# Patient Record
Sex: Female | Born: 1940 | Race: White | Hispanic: No | Marital: Single | Smoking: Former smoker
Health system: Southern US, Community
[De-identification: ages and names within clinical notes are randomized; demographics above are authoritative.]

## PROBLEM LIST (undated history)

## (undated) DIAGNOSIS — I2510 Atherosclerotic heart disease of native coronary artery without angina pectoris: Secondary | ICD-10-CM

## (undated) DIAGNOSIS — J449 Chronic obstructive pulmonary disease, unspecified: Secondary | ICD-10-CM

## (undated) DIAGNOSIS — I509 Heart failure, unspecified: Secondary | ICD-10-CM

## (undated) HISTORY — PX: CHOLECYSTECTOMY: SHX55

## (undated) HISTORY — PX: BOWEL RESECTION: SHX1257

## (undated) HISTORY — PX: ABDOMINAL HYSTERECTOMY: SHX81

---

## 2012-03-19 DIAGNOSIS — F419 Anxiety disorder, unspecified: Secondary | ICD-10-CM | POA: Insufficient documentation

## 2012-04-08 DIAGNOSIS — J449 Chronic obstructive pulmonary disease, unspecified: Secondary | ICD-10-CM | POA: Insufficient documentation

## 2013-05-31 DIAGNOSIS — R000 Tachycardia, unspecified: Secondary | ICD-10-CM

## 2013-05-31 HISTORY — DX: Tachycardia, unspecified: R00.0

## 2013-10-04 DIAGNOSIS — I5032 Chronic diastolic (congestive) heart failure: Secondary | ICD-10-CM | POA: Diagnosis present

## 2019-01-05 DIAGNOSIS — J690 Pneumonitis due to inhalation of food and vomit: Secondary | ICD-10-CM

## 2019-01-05 HISTORY — DX: Pneumonitis due to inhalation of food and vomit: J69.0

## 2020-03-14 DIAGNOSIS — R001 Bradycardia, unspecified: Secondary | ICD-10-CM

## 2020-03-14 HISTORY — DX: Bradycardia, unspecified: R00.1

## 2020-09-08 DIAGNOSIS — N179 Acute kidney failure, unspecified: Secondary | ICD-10-CM | POA: Insufficient documentation

## 2020-09-08 HISTORY — DX: Acute kidney failure, unspecified: N17.9

## 2021-04-03 DIAGNOSIS — A0472 Enterocolitis due to Clostridium difficile, not specified as recurrent: Secondary | ICD-10-CM

## 2021-04-03 HISTORY — DX: Enterocolitis due to Clostridium difficile, not specified as recurrent: A04.72

## 2022-01-02 ENCOUNTER — Other Ambulatory Visit: Payer: Self-pay

## 2022-01-02 ENCOUNTER — Emergency Department (HOSPITAL_COMMUNITY): Payer: Medicare Other

## 2022-01-02 ENCOUNTER — Encounter (HOSPITAL_COMMUNITY): Payer: Self-pay | Admitting: Emergency Medicine

## 2022-01-02 ENCOUNTER — Inpatient Hospital Stay (HOSPITAL_COMMUNITY)
Admission: EM | Admit: 2022-01-02 | Discharge: 2022-01-04 | DRG: 291 | Disposition: A | Discharge status: Home / Self Care | Payer: Medicare Other | Attending: Internal Medicine | Admitting: Internal Medicine

## 2022-01-02 DIAGNOSIS — F419 Anxiety disorder, unspecified: Secondary | ICD-10-CM | POA: Diagnosis present

## 2022-01-02 DIAGNOSIS — N1831 Chronic kidney disease, stage 3a: Secondary | ICD-10-CM | POA: Diagnosis present

## 2022-01-02 DIAGNOSIS — E785 Hyperlipidemia, unspecified: Secondary | ICD-10-CM | POA: Diagnosis present

## 2022-01-02 DIAGNOSIS — I10 Essential (primary) hypertension: Secondary | ICD-10-CM | POA: Diagnosis present

## 2022-01-02 DIAGNOSIS — G629 Polyneuropathy, unspecified: Secondary | ICD-10-CM | POA: Diagnosis present

## 2022-01-02 DIAGNOSIS — M109 Gout, unspecified: Secondary | ICD-10-CM | POA: Diagnosis present

## 2022-01-02 DIAGNOSIS — M549 Dorsalgia, unspecified: Secondary | ICD-10-CM | POA: Diagnosis present

## 2022-01-02 DIAGNOSIS — I509 Heart failure, unspecified: Secondary | ICD-10-CM | POA: Diagnosis not present

## 2022-01-02 DIAGNOSIS — J189 Pneumonia, unspecified organism: Secondary | ICD-10-CM | POA: Diagnosis present

## 2022-01-02 DIAGNOSIS — R0600 Dyspnea, unspecified: Secondary | ICD-10-CM

## 2022-01-02 DIAGNOSIS — Z20822 Contact with and (suspected) exposure to covid-19: Secondary | ICD-10-CM | POA: Diagnosis present

## 2022-01-02 DIAGNOSIS — J810 Acute pulmonary edema: Secondary | ICD-10-CM | POA: Diagnosis not present

## 2022-01-02 DIAGNOSIS — J440 Chronic obstructive pulmonary disease with acute lower respiratory infection: Secondary | ICD-10-CM | POA: Diagnosis present

## 2022-01-02 DIAGNOSIS — D649 Anemia, unspecified: Secondary | ICD-10-CM | POA: Diagnosis present

## 2022-01-02 DIAGNOSIS — R778 Other specified abnormalities of plasma proteins: Secondary | ICD-10-CM

## 2022-01-02 DIAGNOSIS — I3139 Other pericardial effusion (noninflammatory): Secondary | ICD-10-CM | POA: Diagnosis present

## 2022-01-02 DIAGNOSIS — K5090 Crohn's disease, unspecified, without complications: Secondary | ICD-10-CM | POA: Diagnosis present

## 2022-01-02 DIAGNOSIS — D631 Anemia in chronic kidney disease: Secondary | ICD-10-CM | POA: Diagnosis present

## 2022-01-02 DIAGNOSIS — I248 Other forms of acute ischemic heart disease: Secondary | ICD-10-CM | POA: Diagnosis present

## 2022-01-02 DIAGNOSIS — I422 Other hypertrophic cardiomyopathy: Secondary | ICD-10-CM | POA: Diagnosis present

## 2022-01-02 DIAGNOSIS — I5043 Acute on chronic combined systolic (congestive) and diastolic (congestive) heart failure: Secondary | ICD-10-CM | POA: Diagnosis present

## 2022-01-02 DIAGNOSIS — I452 Bifascicular block: Secondary | ICD-10-CM | POA: Diagnosis present

## 2022-01-02 DIAGNOSIS — J9621 Acute and chronic respiratory failure with hypoxia: Secondary | ICD-10-CM | POA: Diagnosis present

## 2022-01-02 DIAGNOSIS — I5033 Acute on chronic diastolic (congestive) heart failure: Secondary | ICD-10-CM | POA: Diagnosis not present

## 2022-01-02 DIAGNOSIS — G8929 Other chronic pain: Secondary | ICD-10-CM | POA: Diagnosis present

## 2022-01-02 DIAGNOSIS — Z9981 Dependence on supplemental oxygen: Secondary | ICD-10-CM | POA: Diagnosis not present

## 2022-01-02 DIAGNOSIS — F32A Depression, unspecified: Secondary | ICD-10-CM | POA: Diagnosis present

## 2022-01-02 DIAGNOSIS — E559 Vitamin D deficiency, unspecified: Secondary | ICD-10-CM | POA: Diagnosis present

## 2022-01-02 DIAGNOSIS — I130 Hypertensive heart and chronic kidney disease with heart failure and stage 1 through stage 4 chronic kidney disease, or unspecified chronic kidney disease: Secondary | ICD-10-CM | POA: Diagnosis not present

## 2022-01-02 HISTORY — DX: Heart failure, unspecified: I50.9

## 2022-01-02 LAB — COMPREHENSIVE METABOLIC PANEL
ALT: 6 U/L (ref 0–44)
AST: 20 U/L (ref 15–41)
Albumin: 3.7 g/dL (ref 3.5–5.0)
Alkaline Phosphatase: 33 U/L — ABNORMAL LOW (ref 38–126)
Anion gap: 12 (ref 5–15)
BUN: 22 mg/dL (ref 8–23)
CO2: 26 mmol/L (ref 22–32)
Calcium: 8.8 mg/dL — ABNORMAL LOW (ref 8.9–10.3)
Chloride: 100 mmol/L (ref 98–111)
Creatinine, Ser: 1.11 mg/dL — ABNORMAL HIGH (ref 0.44–1.00)
GFR, Estimated: 51 mL/min — ABNORMAL LOW (ref >60)
Glucose, Bld: 104 mg/dL — ABNORMAL HIGH (ref 70–99)
Potassium: 4.6 mmol/L (ref 3.5–5.1)
Sodium: 138 mmol/L (ref 135–145)
Total Bilirubin: 0.6 mg/dL (ref 0.3–1.2)
Total Protein: 6.6 g/dL (ref 6.5–8.1)

## 2022-01-02 LAB — CBC
HCT: 31.3 % — ABNORMAL LOW (ref 36.0–46.0)
Hemoglobin: 10.5 g/dL — ABNORMAL LOW (ref 12.0–15.0)
MCH: 31.9 pg (ref 26.0–34.0)
MCHC: 33.5 g/dL (ref 30.0–36.0)
MCV: 95.1 fL (ref 80.0–100.0)
Platelets: 238 10*3/uL (ref 150–400)
RBC: 3.29 MIL/uL — ABNORMAL LOW (ref 3.87–5.11)
RDW: 13.1 % (ref 11.5–15.5)
WBC: 5.9 10*3/uL (ref 4.0–10.5)
nRBC: 0 % (ref 0.0–0.2)

## 2022-01-02 LAB — BLOOD GAS, VENOUS
Acid-Base Excess: 6.6 mmol/L — ABNORMAL HIGH (ref 0.0–2.0)
Bicarbonate: 31.3 mmol/L — ABNORMAL HIGH (ref 20.0–28.0)
Drawn by: 56039
O2 Saturation: 89.8 %
Patient temperature: 37
pCO2, Ven: 44 mmHg (ref 44–60)
pH, Ven: 7.46 — ABNORMAL HIGH (ref 7.25–7.43)
pO2, Ven: 62 mmHg — ABNORMAL HIGH (ref 32–45)

## 2022-01-02 LAB — CBC WITH DIFFERENTIAL/PLATELET
Abs Immature Granulocytes: 0.02 10*3/uL (ref 0.00–0.07)
Abs Immature Granulocytes: 0.03 10*3/uL (ref 0.00–0.07)
Basophils Absolute: 0.1 10*3/uL (ref 0.0–0.1)
Basophils Absolute: 0.1 10*3/uL (ref 0.0–0.1)
Basophils Relative: 1 %
Basophils Relative: 1 %
Eosinophils Absolute: 0.1 10*3/uL (ref 0.0–0.5)
Eosinophils Absolute: 0.2 10*3/uL (ref 0.0–0.5)
Eosinophils Relative: 2 %
Eosinophils Relative: 3 %
HCT: 31.1 % — ABNORMAL LOW (ref 36.0–46.0)
HCT: 35.5 % — ABNORMAL LOW (ref 36.0–46.0)
Hemoglobin: 10.1 g/dL — ABNORMAL LOW (ref 12.0–15.0)
Hemoglobin: 11.5 g/dL — ABNORMAL LOW (ref 12.0–15.0)
Immature Granulocytes: 0 %
Immature Granulocytes: 0 %
Lymphocytes Relative: 12 %
Lymphocytes Relative: 17 %
Lymphs Abs: 0.8 10*3/uL (ref 0.7–4.0)
Lymphs Abs: 1 10*3/uL (ref 0.7–4.0)
MCH: 30.6 pg (ref 26.0–34.0)
MCH: 31.3 pg (ref 26.0–34.0)
MCHC: 32.4 g/dL (ref 30.0–36.0)
MCHC: 32.5 g/dL (ref 30.0–36.0)
MCV: 94.2 fL (ref 80.0–100.0)
MCV: 96.5 fL (ref 80.0–100.0)
Monocytes Absolute: 0.4 10*3/uL (ref 0.1–1.0)
Monocytes Absolute: 0.4 10*3/uL (ref 0.1–1.0)
Monocytes Relative: 6 %
Monocytes Relative: 7 %
Neutro Abs: 4 10*3/uL (ref 1.7–7.7)
Neutro Abs: 5.3 10*3/uL (ref 1.7–7.7)
Neutrophils Relative %: 73 %
Neutrophils Relative %: 78 %
Platelets: 236 10*3/uL (ref 150–400)
Platelets: 272 10*3/uL (ref 150–400)
RBC: 3.3 MIL/uL — ABNORMAL LOW (ref 3.87–5.11)
RBC: 3.68 MIL/uL — ABNORMAL LOW (ref 3.87–5.11)
RDW: 12.9 % (ref 11.5–15.5)
RDW: 13 % (ref 11.5–15.5)
WBC: 5.6 10*3/uL (ref 4.0–10.5)
WBC: 6.8 10*3/uL (ref 4.0–10.5)
nRBC: 0 % (ref 0.0–0.2)
nRBC: 0 % (ref 0.0–0.2)

## 2022-01-02 LAB — BRAIN NATRIURETIC PEPTIDE: B Natriuretic Peptide: 574.4 pg/mL — ABNORMAL HIGH (ref 0.0–100.0)

## 2022-01-02 LAB — CREATININE, SERUM
Creatinine, Ser: 1.15 mg/dL — ABNORMAL HIGH (ref 0.44–1.00)
GFR, Estimated: 48 mL/min — ABNORMAL LOW (ref >60)

## 2022-01-02 LAB — RESP PANEL BY RT-PCR (FLU A&B, COVID) ARPGX2
Influenza A by PCR: NEGATIVE
Influenza B by PCR: NEGATIVE
SARS Coronavirus 2 by RT PCR: NEGATIVE

## 2022-01-02 LAB — I-STAT VENOUS BLOOD GAS, ED
Acid-Base Excess: 8 mmol/L — ABNORMAL HIGH (ref 0.0–2.0)
Bicarbonate: 29.4 mmol/L — ABNORMAL HIGH (ref 20.0–28.0)
Calcium, Ion: 0.99 mmol/L — ABNORMAL LOW (ref 1.15–1.40)
HCT: 33 % — ABNORMAL LOW (ref 36.0–46.0)
Hemoglobin: 11.2 g/dL — ABNORMAL LOW (ref 12.0–15.0)
O2 Saturation: 99 %
Potassium: 4.5 mmol/L (ref 3.5–5.1)
Sodium: 138 mmol/L (ref 135–145)
TCO2: 30 mmol/L (ref 22–32)
pCO2, Ven: 30.7 mmHg — ABNORMAL LOW (ref 44–60)
pH, Ven: 7.589 — ABNORMAL HIGH (ref 7.25–7.43)
pO2, Ven: 96 mmHg — ABNORMAL HIGH (ref 32–45)

## 2022-01-02 LAB — TROPONIN I (HIGH SENSITIVITY)
Troponin I (High Sensitivity): 139 ng/L (ref <18)
Troponin I (High Sensitivity): 211 ng/L (ref <18)
Troponin I (High Sensitivity): 218 ng/L (ref <18)

## 2022-01-02 LAB — MAGNESIUM: Magnesium: 2.1 mg/dL (ref 1.7–2.4)

## 2022-01-02 LAB — D-DIMER, QUANTITATIVE: D-Dimer, Quant: 0.38 ug/mL-FEU (ref 0.00–0.50)

## 2022-01-02 LAB — TSH: TSH: 2.01 u[IU]/mL (ref 0.350–4.500)

## 2022-01-02 LAB — LIPASE, BLOOD: Lipase: 43 U/L (ref 11–51)

## 2022-01-02 MED ORDER — FUROSEMIDE 10 MG/ML IJ SOLN
20.0000 mg | Freq: Every day | INTRAMUSCULAR | Status: DC
Start: 2022-01-02 — End: 2022-01-02

## 2022-01-02 MED ORDER — MELATONIN 5 MG PO TABS: 5.0000 mg | ORAL_TABLET | Freq: Every evening | ORAL | Status: DC | PRN

## 2022-01-02 MED ORDER — SODIUM CHLORIDE 0.9 % IV SOLN
1.0000 g | Freq: Once | INTRAVENOUS | Status: AC
  Administered 2022-01-02: 1 g via INTRAVENOUS
  Filled 2022-01-02: qty 10

## 2022-01-02 MED ORDER — POLYETHYLENE GLYCOL 3350 17 G PO PACK: 17.0000 g | PACK | Freq: Every day | ORAL | Status: DC | PRN

## 2022-01-02 MED ORDER — AZITHROMYCIN 500 MG IV SOLR
500.0000 mg | Freq: Once | INTRAVENOUS | Status: AC
  Administered 2022-01-02: 500 mg via INTRAVENOUS
  Filled 2022-01-02: qty 5

## 2022-01-02 MED ORDER — EZETIMIBE 10 MG PO TABS
10.0000 mg | ORAL_TABLET | Freq: Every day | ORAL | Status: DC
  Administered 2022-01-03 – 2022-01-04 (×2): 10 mg via ORAL
  Filled 2022-01-02 (×2): qty 1

## 2022-01-02 MED ORDER — IPRATROPIUM-ALBUTEROL 0.5-2.5 (3) MG/3ML IN SOLN
3.0000 mL | Freq: Four times a day (QID) | RESPIRATORY_TRACT | Status: DC
  Administered 2022-01-02 – 2022-01-03 (×3): 3 mL via RESPIRATORY_TRACT
  Filled 2022-01-02 (×3): qty 3

## 2022-01-02 MED ORDER — ACETAMINOPHEN 325 MG PO TABS
650.0000 mg | ORAL_TABLET | Freq: Four times a day (QID) | ORAL | Status: DC | PRN
  Administered 2022-01-03: 650 mg via ORAL
  Filled 2022-01-02: qty 2

## 2022-01-02 MED ORDER — IPRATROPIUM-ALBUTEROL 0.5-2.5 (3) MG/3ML IN SOLN
3.0000 mL | Freq: Once | RESPIRATORY_TRACT | Status: AC
  Administered 2022-01-02: 3 mL via RESPIRATORY_TRACT
  Filled 2022-01-02: qty 3

## 2022-01-02 MED ORDER — ASPIRIN 81 MG PO CHEW
324.0000 mg | CHEWABLE_TABLET | Freq: Once | ORAL | Status: AC
  Administered 2022-01-02: 324 mg via ORAL
  Filled 2022-01-02: qty 4

## 2022-01-02 MED ORDER — FUROSEMIDE 10 MG/ML IJ SOLN
20.0000 mg | Freq: Every day | INTRAMUSCULAR | Status: DC
  Administered 2022-01-03: 20 mg via INTRAVENOUS
  Filled 2022-01-02: qty 2

## 2022-01-02 MED ORDER — PANTOPRAZOLE SODIUM 40 MG PO TBEC
40.0000 mg | DELAYED_RELEASE_TABLET | Freq: Every day | ORAL | Status: DC
Start: 2022-01-03 — End: 2022-01-04
  Administered 2022-01-03 – 2022-01-04 (×2): 40 mg via ORAL
  Filled 2022-01-02 (×2): qty 1

## 2022-01-02 MED ORDER — FUROSEMIDE 10 MG/ML IJ SOLN
20.0000 mg | Freq: Once | INTRAMUSCULAR | Status: AC
  Administered 2022-01-02: 20 mg via INTRAVENOUS
  Filled 2022-01-02: qty 2

## 2022-01-02 MED ORDER — ENOXAPARIN SODIUM 40 MG/0.4ML IJ SOSY
40.0000 mg | PREFILLED_SYRINGE | INTRAMUSCULAR | Status: DC
  Administered 2022-01-02 – 2022-01-03 (×2): 40 mg via SUBCUTANEOUS
  Filled 2022-01-02 (×2): qty 0.4

## 2022-01-02 MED ORDER — ALPRAZOLAM 0.5 MG PO TABS
0.5000 mg | ORAL_TABLET | Freq: Two times a day (BID) | ORAL | Status: DC | PRN
Start: 2022-01-02 — End: 2022-01-03
  Administered 2022-01-02 – 2022-01-03 (×2): 0.5 mg via ORAL
  Filled 2022-01-02 (×2): qty 1

## 2022-01-02 MED ORDER — FUROSEMIDE 10 MG/ML IJ SOLN
20.0000 mg | Freq: Every day | INTRAMUSCULAR | Status: DC
Start: 2022-01-03 — End: 2022-01-02

## 2022-01-02 MED ORDER — PROCHLORPERAZINE EDISYLATE 10 MG/2ML IJ SOLN
10.0000 mg | Freq: Four times a day (QID) | INTRAMUSCULAR | Status: DC | PRN
Start: 2022-01-02 — End: 2022-01-04

## 2022-01-02 MED ORDER — IOHEXOL 300 MG/ML  SOLN
100.0000 mL | Freq: Once | INTRAMUSCULAR | Status: AC | PRN
  Administered 2022-01-02: 100 mL via INTRAVENOUS

## 2022-01-02 MED ORDER — CARBAMAZEPINE ER 200 MG PO CP12
200.0000 mg | ORAL_CAPSULE | Freq: Two times a day (BID) | ORAL | Status: DC
  Administered 2022-01-02 – 2022-01-03 (×2): 200 mg via ORAL
  Filled 2022-01-02 (×4): qty 1

## 2022-01-02 MED ORDER — SODIUM CHLORIDE 0.9 % IV BOLUS
500.0000 mL | Freq: Once | INTRAVENOUS | Status: AC
  Administered 2022-01-02: 500 mL via INTRAVENOUS

## 2022-01-02 NOTE — ED Provider Notes (Signed)
[facility] EMERGENCY DEPARTMENT Provider Note   CSN: [id number] Arrival date & time: 01/02/22  1617     History  Chief Complaint  Patient presents with   Hypotension    Brittany Irwin is a 81 y.o. female.  Patient as above with significant medical history as below, including diastolic heart failure, Crohn disease, prior bowel obstruction, 3 to 4 L home oxygen at all times, Petrova cardiomyopathy, anxiety who presents to the ED with complaint of dyspnea, abdominal tightness/fullness, palpitations, malaise.  Patient reports she has been declining over the past few months.  In the past 2 days she has felt more short of breath than normal and she feels as though her heart is been racing intermittently.  Prior to calling EMS this afternoon she was ambulating to the bathroom, she is feeling short of breath with exertion and also feels like her heart was racing.  She took half of an atenolol and a Xanax and a nitroglycerin and her symptoms improved greatly.  No chest pain during this time.  Patient also reports increased abdominal fullness and tightness over the past week.  Right upper quadrant pain.  No nausea or vomiting.  No change in bowel or bladder function.  No melena or BRBPR.  No fevers or chills.  No recent travel or sick contacts.  No recent medication changes or diet changes.  Compliant with home medications.     History reviewed. No pertinent past medical history.  History reviewed. No pertinent surgical history.   The history is provided by the patient and the EMS personnel. No language interpreter was used.       Home Medications Prior to Admission medications   Not on File      Allergies    Patient has no allergy information on record.    Review of Systems   Review of Systems  Constitutional:  Positive for activity change and fatigue. Negative for chills and fever.  HENT:  Negative for facial swelling and trouble swallowing.   Eyes:  Negative for  photophobia and visual disturbance.  Respiratory:  Positive for shortness of breath. Negative for cough.   Cardiovascular:  Positive for palpitations. Negative for chest pain.  Gastrointestinal:  Positive for abdominal pain. Negative for anal bleeding, diarrhea, nausea and vomiting.  Endocrine: Negative for polydipsia and polyuria.  Genitourinary:  Negative for difficulty urinating and hematuria.  Musculoskeletal:  Negative for gait problem and joint swelling.  Skin:  Negative for pallor and rash.  Neurological:  Negative for syncope and headaches.  Psychiatric/Behavioral:  Negative for agitation and confusion.     Physical Exam Updated Vital Signs BP (!) 120/55   Pulse (!) 50   Temp 98.1 F (36.7 C) (Oral)   Resp 14   SpO2 95%  Physical Exam Vitals and nursing note reviewed. Exam conducted with a chaperone present.  Constitutional:      General: She is not in acute distress.    Appearance: Normal appearance. She is not ill-appearing.  HENT:     Head: Normocephalic and atraumatic. No raccoon eyes, Battle's sign, right periorbital erythema or left periorbital erythema.     Right Ear: External ear normal.     Left Ear: External ear normal.     Nose: Nose normal.     Mouth/Throat:     Mouth: Mucous membranes are moist.  Eyes:     General: No scleral icterus.       Right eye: No discharge.  Left eye: No discharge.     Extraocular Movements: Extraocular movements intact.     Pupils: Pupils are equal, round, and reactive to light.  Cardiovascular:     Rate and Rhythm: Normal rate and regular rhythm.     Pulses: Normal pulses.          Radial pulses are 2+ on the right side and 2+ on the left side.       Dorsalis pedis pulses are 2+ on the right side and 2+ on the left side.     Heart sounds: Normal heart sounds.  Pulmonary:     Effort: Pulmonary effort is normal. No respiratory distress.     Breath sounds: Normal breath sounds.  Abdominal:     General: Abdomen is flat.      Tenderness: There is no abdominal tenderness.  Musculoskeletal:        General: Normal range of motion.     Cervical back: Normal range of motion.     Right lower leg: No edema.     Left lower leg: No edema.  Skin:    General: Skin is warm and dry.     Capillary Refill: Capillary refill takes less than 2 seconds.     Coloration: Skin is pale.  Neurological:     Mental Status: She is alert and oriented to person, place, and time.     GCS: GCS eye subscore is 4. GCS verbal subscore is 5. GCS motor subscore is 6.     Cranial Nerves: Cranial nerves 2-12 are intact. No dysarthria.     Sensory: Sensation is intact.     Motor: Motor function is intact.     Coordination: Coordination is intact.  Psychiatric:        Mood and Affect: Mood normal.        Behavior: Behavior normal.     ED Results / Procedures / Treatments   Labs (all labs ordered are listed, but only abnormal results are displayed) Labs Reviewed  CBC WITH DIFFERENTIAL/PLATELET - Abnormal; Notable for the following components:      Result Value   RBC 3.68 (*)    Hemoglobin 11.5 (*)    HCT 35.5 (*)    All other components within normal limits  BRAIN NATRIURETIC PEPTIDE - Abnormal; Notable for the following components:   B Natriuretic Peptide 574.4 (*)    All other components within normal limits  COMPREHENSIVE METABOLIC PANEL - Abnormal; Notable for the following components:   Glucose, Bld 104 (*)    Creatinine, Ser 1.11 (*)    Calcium 8.8 (*)    Alkaline Phosphatase 33 (*)    GFR, Estimated 51 (*)    All other components within normal limits  CBC - Abnormal; Notable for the following components:   RBC 3.29 (*)    Hemoglobin 10.5 (*)    HCT 31.3 (*)    All other components within normal limits  CREATININE, SERUM - Abnormal; Notable for the following components:   Creatinine, Ser 1.15 (*)    GFR, Estimated 48 (*)    All other components within normal limits  I-STAT VENOUS BLOOD GAS, ED - Abnormal; Notable  for the following components:   pH, Ven 7.589 (*)    pCO2, Ven 30.7 (*)    pO2, Ven 96 (*)    Bicarbonate 29.4 (*)    Acid-Base Excess 8.0 (*)    Calcium, Ion 0.99 (*)    HCT 33.0 (*)    Hemoglobin 11.2 (*)  All other components within normal limits  TROPONIN I (HIGH SENSITIVITY) - Abnormal; Notable for the following components:   Troponin I (High Sensitivity) 139 (*)    All other components within normal limits  TROPONIN I (HIGH SENSITIVITY) - Abnormal; Notable for the following components:   Troponin I (High Sensitivity) 211 (*)    All other components within normal limits  TROPONIN I (HIGH SENSITIVITY) - Abnormal; Notable for the following components:   Troponin I (High Sensitivity) 218 (*)    All other components within normal limits  RESP PANEL BY RT-PCR (FLU A&B, COVID) ARPGX2  EXPECTORATED SPUTUM ASSESSMENT W GRAM STAIN, RFLX TO RESP C  D-DIMER, QUANTITATIVE  TSH  LIPASE, BLOOD  MAGNESIUM  BLOOD GAS, VENOUS  URINALYSIS, ROUTINE W REFLEX MICROSCOPIC  CBC WITH DIFFERENTIAL/PLATELET  COMPREHENSIVE METABOLIC PANEL  MAGNESIUM  PHOSPHORUS  TROPONIN I (HIGH SENSITIVITY)    EKG EKG Interpretation  Date/Time:  Friday January 02 2022 21:49:53 EDT Ventricular Rate:  51 PR Interval:  230 QRS Duration: 147 QT Interval:  534 QTC Calculation: 492 R Axis:   -6 Text Interpretation: Sinus rhythm Prolonged PR interval IVCD, consider atypical RBBB LVH with secondary repolarization abnormality Lateral infarct, old Anterior Q waves, possibly due to LVH Interpretation limited secondary to artifact Confirmed by Tanda Rockers ([id number]) on 01/02/2022 10:55:42 PM  Radiology CT ABDOMEN PELVIS W CONTRAST  Result Date: 01/02/2022 CLINICAL DATA:  Abdominal pain. EXAM: CT ABDOMEN AND PELVIS WITH CONTRAST TECHNIQUE: Multidetector CT imaging of the abdomen and pelvis was performed using the standard protocol following bolus administration of intravenous contrast. RADIATION DOSE REDUCTION: This exam  was performed according to the departmental dose-optimization program which includes automated exposure control, adjustment of the mA and/or kV according to patient size and/or use of iterative reconstruction technique. CONTRAST:  OMNIPAQUE IOHEXOL 300 MG/ML  SOLN COMPARISON:  None Available. FINDINGS: Lower chest: There are bibasilar linear atelectasis/scarring. Cluster of nodularity at the right lung base may be chronic scarring. Atypical infiltrate or aspiration is not excluded clinical correlation is recommended. Trace pleural effusions suspected. There is coronary vascular calcification. No intra-abdominal free air or free fluid. Hepatobiliary: The liver is unremarkable. There is mild dilatation of the biliary tree, likely post cholecystectomy. No retained calcified stone noted in the central CBD. Pancreas: Unremarkable. No pancreatic ductal dilatation or surrounding inflammatory changes. Spleen: Normal in size without focal abnormality. Adrenals/Urinary Tract: The adrenal glands are unremarkable. Several small left renal cysts. There is no hydronephrosis on either side. There is symmetric enhancement of the kidneys. The visualized ureters and urinary bladder appear unremarkable. Stomach/Bowel: There is postsurgical changes of right hemicolectomy with ileotransverse anastomosis in the right upper abdomen. There is sigmoid diverticulosis without active inflammatory changes. There is no bowel obstruction or active inflammation. Appendectomy. Vascular/Lymphatic: Mild aortoiliac atherosclerotic disease. The IVC is unremarkable. No portal venous gas. There is no adenopathy. Reproductive: Hysterectomy.  No adnexal masses. Other: Midline vertical anterior pelvic wall incisional scar. Musculoskeletal: Osteopenia with degenerative changes of the spine. No acute osseous pathology. IMPRESSION: 1. No acute intra-abdominal or pelvic pathology. 2. Sigmoid diverticulosis without active inflammatory changes. 3.  Postsurgical changes of right hemicolectomy with ileotransverse anastomosis in the right upper abdomen. No bowel obstruction. 4. Cluster of nodularity at the right lung base may be chronic scarring. Atypical infiltrate or aspiration is not excluded clinical correlation is recommended. Trace pleural effusions suspected. 5. Aortic Atherosclerosis (ICD10-I70.0). Electronically Signed   By: Elgie Collard M.D.   On: 01/02/2022 20:13  DG Chest Portable 1 View  Result Date: 01/02/2022 CLINICAL DATA:  Patient rotation EXAM: PORTABLE CHEST 1 VIEW COMPARISON:  Same day PA and lateral chest x-ray. FINDINGS: Mild cardiomegaly. Lower lung predominant interstitial opacities. More focal linear opacities are seen in the right lower lung. No evidence of pleural effusion or pneumothorax. IMPRESSION: 1. Lower lung predominant interstitial opacities, likely due to pulmonary edema. 2. More focal linear opacities are seen in the right lower lung, possibly due to scarring. Recommend correlation with outside prior imaging. Electronically Signed   By: Allegra Lai M.D.   On: 01/02/2022 18:22   DG Chest 2 View  Result Date: 01/02/2022 CLINICAL DATA:  Dib. Per EMS pt states that she was "not feeling well" and took 1 Nitro Sublingual, 1 Xanax and 1 Atenolol. Pt denied having CP. Pt also c/o Increased SHOB w/ exertion, weakness x2 days. EXAM: CHEST - 2 VIEW.  Patient is rotated on the frontal view. COMPARISON:  None available FINDINGS: The heart and mediastinal contours are within normal limits. Bibasilar atelectasis. Increased interstitial markings and Kerley B lines. No pulmonary edema. Trace bilateral pleural effusions. No pneumothorax. No acute osseous abnormality. IMPRESSION: 1. Pulmonary edema with trace bilateral pleural effusions. 2. Markedly rotated patient on frontal view due to patient rotation. Recommend repeat PA radiograph. Electronically Signed   By: Tish Frederickson M.D.   On: 01/02/2022 17:06     Procedures .Critical Care  Performed by: Sloan Leiter, DO Authorized by: Sloan Leiter, DO   Critical care provider statement:    Critical care time (minutes):  40   Critical care was necessary to treat or prevent imminent or life-threatening deterioration of the following conditions:  Cardiac failure   Critical care was time spent personally by me on the following activities:  Development of treatment plan with patient or surrogate, discussions with consultants, evaluation of patient's response to treatment, examination of patient, ordering and review of laboratory studies, ordering and review of radiographic studies, ordering and performing treatments and interventions, pulse oximetry, re-evaluation of patient's condition, review of old charts and obtaining history from patient or surrogate   Care discussed with: admitting provider       Medications Ordered in ED Medications  enoxaparin (LOVENOX) injection 40 mg (40 mg Subcutaneous Given 01/02/22 2147)  acetaminophen (TYLENOL) tablet 650 mg (has no administration in time range)  melatonin tablet 5 mg (has no administration in time range)  polyethylene glycol (MIRALAX / GLYCOLAX) packet 17 g (has no administration in time range)  prochlorperazine (COMPAZINE) injection 10 mg (has no administration in time range)  furosemide (LASIX) injection 20 mg (has no administration in time range)  carbamazepine (EQUETRO) 12 hr capsule 200 mg (has no administration in time range)  ALPRAZolam (XANAX) tablet 0.5 mg (0.5 mg Oral Given 01/02/22 2256)  ezetimibe (ZETIA) tablet 10 mg (has no administration in time range)  pantoprazole (PROTONIX) EC tablet 40 mg (has no administration in time range)  ipratropium-albuterol (DUONEB) 0.5-2.5 (3) MG/3ML nebulizer solution 3 mL (3 mLs Nebulization Given 01/02/22 2254)  sodium chloride 0.9 % bolus 500 mL (0 mLs Intravenous Stopped 01/02/22 1914)  ipratropium-albuterol (DUONEB) 0.5-2.5 (3) MG/3ML nebulizer  solution 3 mL (3 mLs Nebulization Given 01/02/22 1804)  furosemide (LASIX) injection 20 mg (20 mg Intravenous Given 01/02/22 1826)  aspirin chewable tablet 324 mg (324 mg Oral Given 01/02/22 1827)  iohexol (OMNIPAQUE) 300 MG/ML solution 100 mL (100 mLs Intravenous Contrast Given 01/02/22 1934)  cefTRIAXone (ROCEPHIN) 1 g in sodium chloride  0.9 % 100 mL IVPB (0 g Intravenous Stopped 01/02/22 2121)  azithromycin (ZITHROMAX) 500 mg in sodium chloride 0.9 % 250 mL IVPB (500 mg Intravenous New Bag/Given 01/02/22 2147)    ED Course/ Medical Decision Making/ A&P Clinical Course as of 01/02/22 2256  Fri Jan 02, 2022  1645 BP with EMS was 90's systolic x1, this was following atenolol, nitro po, and xanax [SG]  1813 BNP elevated over 500, chest x-ray concern for pulmonary edema, patient with worsening exertional dyspnea.  Hold off on IV fluids.  We will Dartt diuresis.  Low threshold to start BiPAP if patient decompensates or becomes hypotensive with diuretics. [SG]  1814 Troponin I (High Sensitivity)(!!): 139 She has no chest pain, ECG is stable.  Favor demand ischemia in setting of CHF exacerbation, dyspnea.  Will give aspirin [SG]    Clinical Course User Index [SG] Sloan Leiter, DO                           Medical Decision Making Amount and/or Complexity of Data Reviewed Labs: ordered. Decision-making details documented in ED Course. Radiology: ordered.  Risk OTC drugs. Prescription drug management. Decision regarding hospitalization.    CC: Multiple complaints including dyspnea, palpitations, right upper quad abdominal pain, malaise  This patient presents to the Emergency Department for the above complaint. This involves an extensive number of treatment options and is a complaint that carries with it a high risk of complications and morbidity. Vital signs were reviewed. Serious etiologies considered.  In my evaluation of this patient's dyspnea my DDx includes, but is not limited to,  pneumonia, pulmonary embolism, pneumothorax, pulmonary edema, metabolic acidosis, asthma, COPD, cardiac cause, anemia, anxiety, etc. Differential diagnosis includes but is not exclusive to acute cholecystitis, intrathoracic causes for epigastric abdominal pain, gastritis, duodenitis, pancreatitis, small bowel or large bowel obstruction, abdominal aortic aneurysm, hernia, gastritis, etc.  Patient was mildly hypotensive with EMS, just prior to being eval by EMS she did take a nitroglycerin, Xanax and beta-blocker.  Likely contributing factor to her low blood pressure.  Blood pressure improved while in the emergency department.  Record review:  Previous records obtained and reviewed  Patient receives majority of her care with [facility], prior office visits, prior labs and imaging reviewed.  PDMP reviewed.   Additional history obtained from EMS  Medical and surgical history as noted above.   Work up as above, notable for:  Labs & imaging results that were available during my care of the patient were visualized by me and considered in my medical decision making.   Patient is low risk Wells score, she is bradycardic and did take a beta-blocker earlier today, she does have exertional dyspnea but no chest pain.  Will obtain D-dimer.  I ordered imaging studies which included chest x-ray, CT abdomen pelvis IV contrast. I visualized the imaging, interpreted images, and I agree with radiologist interpretation.  ?Pneumonia, pulmonary edema, pleural effusion  Cardiac monitoring reviewed and interpreted personally which shows sinus bradycardia  BNP is elevated, patient is evidence of pulmonary edema and pleural effusion on imaging.  She is breathing comfortably on her 4 L home oxygen.  She is amenable to BiPAP if needed.  Blood pressure has been stable With diuresis.  She has elevated troponin, favor demand ischemia in setting of CHF exacerbation, dyspnea.  No chest pain.  ECG is stable.  Will give aspirin.   Recommend trend troponin to peak  Low risk Wells score,  D-dimer is negative.  Suspicion for PE is reduced.  Dyspnea, cough with white/clear sputum mildly worse than her baseline, history of COPD, home oxygen dependence.  Questionable pneumonia on imaging.  Will empirically treat with broad-spectrum antibiotics given aberration of respiratory status  Management: Aspirin, diuresis, broad-spectrum antibiotics  Did initially order IV fluids but these were discontinued prior to administration given reduced blood pressure just prior to arrival.   Reassessment:  Patient is feeling much better after intervention the emergency department.  Given respiratory difficulty, concern for diastolic heart failure exacerbation requiring IV diuresis, borderline hypotension, questionable pneumonia with favor admission.  Patient is agreeable  Admission was considered.    Discussed with Dr. Margo Aye who accepts patient for admission.            Social determinants of health include -  Social History   Socioeconomic History   Marital status: Unknown    Spouse name: Not on file   Number of children: Not on file   Years of education: Not on file   Highest education level: Not on file  Occupational History   Not on file  Tobacco Use   Smoking status: Not on file   Smokeless tobacco: Not on file  Substance and Sexual Activity   Alcohol use: Not on file   Drug use: Not on file   Sexual activity: Not on file  Other Topics Concern   Not on file  Social History Narrative   Not on file   Social Determinants of Health   Financial Resource Strain: Not on file  Food Insecurity: Not on file  Transportation Needs: Not on file  Physical Activity: Not on file  Stress: Not on file  Social Connections: Not on file  Intimate Partner Violence: Not on file      This chart was dictated using voice recognition software.  Despite best efforts to proofread,  errors can occur which can change the  documentation meaning.         Final Clinical Impression(s) / ED Diagnoses Final diagnoses:  Acute on chronic diastolic congestive heart failure (HCC)  Acute pulmonary edema (HCC)  Community acquired pneumonia, unspecified laterality  Dyspnea, unspecified type  Elevated troponin    Rx / DC Orders ED Discharge Orders     None         Sloan Leiter, DO 01/02/22 2256

## 2022-01-02 NOTE — ED Notes (Signed)
Patient transported to CT 

## 2022-01-02 NOTE — ED Notes (Signed)
ED provider Dr. Wallace Cullens informed of pt critical second troponin 211

## 2022-01-02 NOTE — H&P (Signed)
History and Physical  Brittany Irwin AQT:[id number] DOB: [birth year] DOA: 01/02/2022  Referring physician: Dr. Wallace Cullens, EDP  PCP: [facility], [place]  Outpatient Specialists: Cardiology, pulmonary, GI, at [facility] Patient coming from: Home via EMS.  Chief Complaint: Shortness of breath  HPI: Brittany Irwin is a 81 y.o. female with medical history significant for essential hypertension, chronic back pain, vitamin D deficiency, peripheral polyneuropathy, COPD, chronic hypoxia on 3 L nasal cannula continuously at baseline, hypertrophic cardiomyopathy, chronic diastolic CHF, hyperlipidemia, chronic anxiety/depression with long-term use of Xanax 1 mg twice daily as needed, history of migraines on carbamazepine 400 mg twice daily, history of Crohn's disease follows with GI, gout, who presented to [facility] ED from home via EMS with complaints of progressive exertional dyspnea of 2 days duration.  No chest pain.  She took 1 nitro sublingual, hoping it would make her feel Irwin, 1 dose of Xanax and 1 dose of atenolol today.  She called EMS due to worsening symptoms.    Upon EMS arrival, she was noted to be mildly hypotensive with systolic blood pressure in the low 90s.  She was brought into the ED for further evaluation.  Work-up in the ED reveals acute CHF with cardiomegaly, pulmonary edema seen on chest x-ray, elevated BNP greater than 500, elevated high-sensitivity troponin, peaked at 211.  No evidence of acute ischemia on twelve-lead EKG.  She continues to deny chest pain in the ED.  She was started on IV diuretics in the ED.  TRH, hospitalist service, was asked to admit.  ED Course:  Tmax 97.8.  BP 114/55, pulse 59, respiration rate 19, O2 saturation 95% on 4 L.  Lab studies remarkable for BUN 22, creatinine 1.11 with GFR 51, no baseline available to compare.  Hemoglobin 11.2.  WBC 6.8, platelet count 272.  BNP 574.  Troponin 139, 211.  Review of Systems: Review of systems as noted in the HPI.  All other systems reviewed and are negative.  Past medical history: Essential hypertension Vitamin D deficiency Peripheral polyneuropathy COPD Chronic hypoxia on 3 to 4 L nasal cannula continuously at baseline. Hypertrophic cardiomyopathy Chronic diastolic CHF Chronic anxiety/depression with long-term use of Xanax 1 mg twice daily as needed Hyperlipidemia History of migraines on carbamazepine 400 mg twice daily History of Crohn's disease follows with GI Gout  Past surgical history: None reported  Social History: No history of tobacco, alcohol and illicit drug use.  Family history: None reported  Home medications:    Physical Exam: BP 109/72   Pulse (!) 51   Temp 97.8 F (36.6 C)   Resp 13   SpO2 96%   General: 81 y.o. year-old female well developed well nourished in no acute distress.  Alert and oriented x3. Cardiovascular: Regular rate and rhythm with no rubs or gallops.  No thyromegaly or JVD noted.  No lower extremity edema. 2/4 pulses in all 4 extremities. Respiratory: Clear to auscultation with no wheezes or rales. Good inspiratory effort. Abdomen: Soft nontender nondistended with normal bowel sounds x4 quadrants. Muskuloskeletal: No cyanosis, clubbing or edema noted bilaterally Neuro: CN II-XII intact, strength, sensation, reflexes Skin: No ulcerative lesions noted or rashes Psychiatry: Judgement and insight appear normal. Mood is appropriate for condition and setting          Labs on Admission:  Basic Metabolic Panel: Recent Labs  Lab 01/02/22 1635 01/02/22 1644  NA 138 138  K 4.6 4.5  CL 100  --   CO2 26  --  GLUCOSE 104*  --   BUN 22  --   CREATININE 1.11*  --   CALCIUM 8.8*  --   MG 2.1  --    Liver Function Tests: Recent Labs  Lab 01/02/22 1635  AST 20  ALT 6  ALKPHOS 33*  BILITOT 0.6  PROT 6.6  ALBUMIN 3.7   Recent Labs  Lab 01/02/22 1635  LIPASE 43   No results for input(s): "AMMONIA" in the last 168 hours. CBC: Recent  Labs  Lab 01/02/22 1635 01/02/22 1644  WBC 6.8  --   NEUTROABS 5.3  --   HGB 11.5* 11.2*  HCT 35.5* 33.0*  MCV 96.5  --   PLT 272  --    Cardiac Enzymes: No results for input(s): "CKTOTAL", "CKMB", "CKMBINDEX", "TROPONINI" in the last 168 hours.  BNP (last 3 results) Recent Labs    01/02/22 1635  BNP 574.4*    ProBNP (last 3 results) No results for input(s): "PROBNP" in the last 8760 hours.  CBG: No results for input(s): "GLUCAP" in the last 168 hours.  Radiological Exams on Admission: CT ABDOMEN PELVIS W CONTRAST  Result Date: 01/02/2022 CLINICAL DATA:  Abdominal pain. EXAM: CT ABDOMEN AND PELVIS WITH CONTRAST TECHNIQUE: Multidetector CT imaging of the abdomen and pelvis was performed using the standard protocol following bolus administration of intravenous contrast. RADIATION DOSE REDUCTION: This exam was performed according to the departmental dose-optimization program which includes automated exposure control, adjustment of the mA and/or kV according to patient size and/or use of iterative reconstruction technique. CONTRAST:  OMNIPAQUE IOHEXOL 300 MG/ML  SOLN COMPARISON:  None Available. FINDINGS: Lower chest: There are bibasilar linear atelectasis/scarring. Cluster of nodularity at the right lung base may be chronic scarring. Atypical infiltrate or aspiration is not excluded clinical correlation is recommended. Trace pleural effusions suspected. There is coronary vascular calcification. No intra-abdominal free air or free fluid. Hepatobiliary: The liver is unremarkable. There is mild dilatation of the biliary tree, likely post cholecystectomy. No retained calcified stone noted in the central CBD. Pancreas: Unremarkable. No pancreatic ductal dilatation or surrounding inflammatory changes. Spleen: Normal in size without focal abnormality. Adrenals/Urinary Tract: The adrenal glands are unremarkable. Several small left renal cysts. There is no hydronephrosis on either side. There  is symmetric enhancement of the kidneys. The visualized ureters and urinary bladder appear unremarkable. Stomach/Bowel: There is postsurgical changes of right hemicolectomy with ileotransverse anastomosis in the right upper abdomen. There is sigmoid diverticulosis without active inflammatory changes. There is no bowel obstruction or active inflammation. Appendectomy. Vascular/Lymphatic: Mild aortoiliac atherosclerotic disease. The IVC is unremarkable. No portal venous gas. There is no adenopathy. Reproductive: Hysterectomy.  No adnexal masses. Other: Midline vertical anterior pelvic wall incisional scar. Musculoskeletal: Osteopenia with degenerative changes of the spine. No acute osseous pathology. IMPRESSION: 1. No acute intra-abdominal or pelvic pathology. 2. Sigmoid diverticulosis without active inflammatory changes. 3. Postsurgical changes of right hemicolectomy with ileotransverse anastomosis in the right upper abdomen. No bowel obstruction. 4. Cluster of nodularity at the right lung base may be chronic scarring. Atypical infiltrate or aspiration is not excluded clinical correlation is recommended. Trace pleural effusions suspected. 5. Aortic Atherosclerosis (ICD10-I70.0). Electronically Signed   By: Elgie Collard M.D.   On: 01/02/2022 20:13   DG Chest Portable 1 View  Result Date: 01/02/2022 CLINICAL DATA:  Patient rotation EXAM: PORTABLE CHEST 1 VIEW COMPARISON:  Same day PA and lateral chest x-ray. FINDINGS: Mild cardiomegaly. Lower lung predominant interstitial opacities. More focal linear opacities are seen  in the right lower lung. No evidence of pleural effusion or pneumothorax. IMPRESSION: 1. Lower lung predominant interstitial opacities, likely due to pulmonary edema. 2. More focal linear opacities are seen in the right lower lung, possibly due to scarring. Recommend correlation with outside prior imaging. Electronically Signed   By: Allegra Lai M.D.   On: 01/02/2022 18:22   DG Chest 2  View  Result Date: 01/02/2022 CLINICAL DATA:  Dib. Per EMS pt states that she was "not feeling well" and took 1 Nitro Sublingual, 1 Xanax and 1 Atenolol. Pt denied having CP. Pt also c/o Increased SHOB w/ exertion, weakness x2 days. EXAM: CHEST - 2 VIEW.  Patient is rotated on the frontal view. COMPARISON:  None available FINDINGS: The heart and mediastinal contours are within normal limits. Bibasilar atelectasis. Increased interstitial markings and Kerley B lines. No pulmonary edema. Trace bilateral pleural effusions. No pneumothorax. No acute osseous abnormality. IMPRESSION: 1. Pulmonary edema with trace bilateral pleural effusions. 2. Markedly rotated patient on frontal view due to patient rotation. Recommend repeat PA radiograph. Electronically Signed   By: Tish Frederickson M.D.   On: 01/02/2022 17:06    EKG: I independently viewed the EKG done and my findings are as followed: Sinus, ectopic atrial rhythm with rate of 56.  RBBB.  QTc 479.  Assessment/Plan Present on Admission: **None**  Principal Problem:   Acute CHF (congestive heart failure) (HCC)  Acute on chronic diastolic CHF Unclear of last 2D echo, records from [facility] Presented with progressive exertional dyspnea Cardiomegaly and pulmonary edema seen on chest x-ray, personally reviewed. BNP greater than 500 Continue diuresing started in the ED Closely monitor vital signs to avoid hypotension. Obtain 2D echo Start strict I's and O's and daily weight Consult cardiology if repeat high-sensitivity troponin Trends up N.p.o. until repeat high-sensitivity troponin downtrends Will need to closely follow-up with cardiology at [facility]  Elevated troponin suspect demand ischemia in the setting of acute on chronic hypoxemia She denies chest pain, states she feels Irwin High-sensitivity troponin 211 Trend troponin Twelve-lead EKG with no evidence of acute ischemia Serial 12 lead EKGs Follow 2D echo  Acute on chronic hypoxic  respiratory failure secondary to pulmonary edema At baseline on 3 L nasal cannula continuously Initially requiring 6 L to maintain O2 saturation greater than 90% Wean back down to 3 L in the ED after diuresing. Maintain O2 saturation greater than 90%  Essential hypertension, BPs are currently soft Hold off home oral antihypertensives Gentle diuresing IV Lasix 20 mg daily due to soft Bps to avoid hypotension. Maintain MAP greater than 65 Closely monitor vital signs  CKD 3A Appears to be at her baseline creatinine 1.1 with GFR of 51 Avoid nephrotoxic agents, dehydration and hypotension Monitor urine output with strict I's and O's Repeat renal panel in the morning.  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    DVT prophylaxis: Subcu Lovenox daily  Code Status: Full code  Family Communication: Family member at bedside  Disposition Plan: Admitted to telemetry cardiac unit  Consults called: None  Admission status: Inpatient status   Status is: Inpatient The patient will require at least 2 midnight for further evaluation and treatment for the condition.   Darlin Drop MD [facility] Pager  [phone]  If 7PM-7AM, please contact night-coverage [email or web address] Password [facility]  01/02/2022, 9:04 PM

## 2022-01-02 NOTE — ED Triage Notes (Signed)
Pt arrives to ED BIB [facility] due to Hypotension. Per EMS pt states that she was "not feeling well" and took 1 Nitro Sublingual, 1 Xanax and 1 Atenolol. Pt denied having CP.  Pt also c/o Increased SHOB w/ exertion, weakness x2 days. Hx of COPD, CHF, HTN, AKI. Pt A/O x4.   Temp 97.3 BP 92/59 HR 54  O2 93% 6L [place] CBG 189

## 2022-01-03 ENCOUNTER — Encounter (HOSPITAL_COMMUNITY): Payer: Self-pay | Admitting: Internal Medicine

## 2022-01-03 ENCOUNTER — Inpatient Hospital Stay (HOSPITAL_COMMUNITY): Payer: Medicare Other

## 2022-01-03 DIAGNOSIS — I509 Heart failure, unspecified: Secondary | ICD-10-CM

## 2022-01-03 DIAGNOSIS — I10 Essential (primary) hypertension: Secondary | ICD-10-CM | POA: Diagnosis present

## 2022-01-03 DIAGNOSIS — D649 Anemia, unspecified: Secondary | ICD-10-CM

## 2022-01-03 DIAGNOSIS — I5033 Acute on chronic diastolic (congestive) heart failure: Secondary | ICD-10-CM | POA: Diagnosis not present

## 2022-01-03 DIAGNOSIS — N1831 Chronic kidney disease, stage 3a: Secondary | ICD-10-CM | POA: Diagnosis present

## 2022-01-03 DIAGNOSIS — G8929 Other chronic pain: Secondary | ICD-10-CM | POA: Diagnosis present

## 2022-01-03 DIAGNOSIS — K5090 Crohn's disease, unspecified, without complications: Secondary | ICD-10-CM | POA: Diagnosis present

## 2022-01-03 LAB — ECHOCARDIOGRAM COMPLETE
AR max vel: 3.2 cm2
AV Area VTI: 2.66 cm2
AV Area mean vel: 3.46 cm2
AV Mean grad: 3 mmHg
AV Peak grad: 5.6 mmHg
Ao pk vel: 1.18 m/s
Area-P 1/2: 5.46 cm2
Calc EF: 56.5 %
Height: 64 in
MV M vel: 5.29 m/s
MV Peak grad: 111.9 mmHg
S' Lateral: 2.9 cm
Single Plane A2C EF: 45.3 %
Single Plane A4C EF: 66.6 %
Weight: 2529.12 oz

## 2022-01-03 LAB — COMPREHENSIVE METABOLIC PANEL
ALT: 7 U/L (ref 0–44)
AST: 19 U/L (ref 15–41)
Albumin: 3.4 g/dL — ABNORMAL LOW (ref 3.5–5.0)
Alkaline Phosphatase: 28 U/L — ABNORMAL LOW (ref 38–126)
Anion gap: 10 (ref 5–15)
BUN: 21 mg/dL (ref 8–23)
CO2: 27 mmol/L (ref 22–32)
Calcium: 8.6 mg/dL — ABNORMAL LOW (ref 8.9–10.3)
Chloride: 100 mmol/L (ref 98–111)
Creatinine, Ser: 1.17 mg/dL — ABNORMAL HIGH (ref 0.44–1.00)
GFR, Estimated: 47 mL/min — ABNORMAL LOW (ref >60)
Glucose, Bld: 109 mg/dL — ABNORMAL HIGH (ref 70–99)
Potassium: 4.2 mmol/L (ref 3.5–5.1)
Sodium: 137 mmol/L (ref 135–145)
Total Bilirubin: 0.5 mg/dL (ref 0.3–1.2)
Total Protein: 6.2 g/dL — ABNORMAL LOW (ref 6.5–8.1)

## 2022-01-03 LAB — TROPONIN I (HIGH SENSITIVITY)
Troponin I (High Sensitivity): 203 ng/L (ref <18)
Troponin I (High Sensitivity): 217 ng/L (ref <18)

## 2022-01-03 LAB — URINALYSIS, ROUTINE W REFLEX MICROSCOPIC
Bilirubin Urine: NEGATIVE
Glucose, UA: NEGATIVE mg/dL
Hgb urine dipstick: NEGATIVE
Ketones, ur: NEGATIVE mg/dL
Leukocytes,Ua: NEGATIVE
Nitrite: NEGATIVE
Protein, ur: NEGATIVE mg/dL
Specific Gravity, Urine: 1.033 — ABNORMAL HIGH (ref 1.005–1.030)
pH: 7 (ref 5.0–8.0)

## 2022-01-03 LAB — EXPECTORATED SPUTUM ASSESSMENT W GRAM STAIN, RFLX TO RESP C

## 2022-01-03 LAB — MAGNESIUM: Magnesium: 2.2 mg/dL (ref 1.7–2.4)

## 2022-01-03 LAB — PHOSPHORUS: Phosphorus: 3.8 mg/dL (ref 2.5–4.6)

## 2022-01-03 MED ORDER — GABAPENTIN 100 MG PO CAPS
100.0000 mg | ORAL_CAPSULE | Freq: Every day | ORAL | Status: DC
  Administered 2022-01-03: 100 mg via ORAL
  Filled 2022-01-03: qty 1

## 2022-01-03 MED ORDER — ALPRAZOLAM 0.5 MG PO TABS
1.0000 mg | ORAL_TABLET | Freq: Three times a day (TID) | ORAL | Status: DC | PRN
  Administered 2022-01-04: 1 mg via ORAL
  Filled 2022-01-03: qty 2

## 2022-01-03 MED ORDER — CODEINE SULFATE 15 MG PO TABS
30.0000 mg | ORAL_TABLET | Freq: Four times a day (QID) | ORAL | Status: DC | PRN
  Administered 2022-01-03 – 2022-01-04 (×2): 30 mg via ORAL
  Filled 2022-01-03 (×3): qty 2

## 2022-01-03 MED ORDER — CARBAMAZEPINE 200 MG PO TABS
400.0000 mg | ORAL_TABLET | Freq: Two times a day (BID) | ORAL | Status: DC
  Administered 2022-01-03 – 2022-01-04 (×2): 400 mg via ORAL
  Filled 2022-01-03 (×3): qty 2

## 2022-01-03 MED ORDER — IPRATROPIUM-ALBUTEROL 0.5-2.5 (3) MG/3ML IN SOLN
3.0000 mL | Freq: Two times a day (BID) | RESPIRATORY_TRACT | Status: DC
  Administered 2022-01-03 – 2022-01-04 (×2): 3 mL via RESPIRATORY_TRACT
  Filled 2022-01-03 (×2): qty 3

## 2022-01-03 MED ORDER — ACETAMINOPHEN-CODEINE 300-60 MG PO TABS: 1.0000 | ORAL_TABLET | Freq: Four times a day (QID) | ORAL | Status: DC | PRN

## 2022-01-03 MED ORDER — ACETAMINOPHEN-CODEINE 300-30 MG PO TABS
1.0000 | ORAL_TABLET | Freq: Four times a day (QID) | ORAL | Status: DC | PRN
  Administered 2022-01-03 – 2022-01-04 (×3): 1 via ORAL
  Filled 2022-01-03 (×3): qty 1

## 2022-01-03 MED ORDER — ATENOLOL 25 MG PO TABS
12.5000 mg | ORAL_TABLET | Freq: Every day | ORAL | Status: DC
  Administered 2022-01-03 – 2022-01-04 (×2): 12.5 mg via ORAL
  Filled 2022-01-03 (×2): qty 1

## 2022-01-03 MED ORDER — BACLOFEN 10 MG PO TABS
5.0000 mg | ORAL_TABLET | Freq: Three times a day (TID) | ORAL | Status: DC | PRN
  Administered 2022-01-03 (×2): 5 mg via ORAL
  Filled 2022-01-03 (×3): qty 1

## 2022-01-03 NOTE — Assessment & Plan Note (Addendum)
Continue with oral analgesics at home.

## 2022-01-03 NOTE — Assessment & Plan Note (Signed)
Anemia of chronic disease hgb is 10,1 and plt at 236

## 2022-01-03 NOTE — Progress Notes (Signed)
Progress Note   Patient: Brittany Irwin OJJ:[id number] DOB: Nov 02, 1940 DOA: 01/02/2022     1 DOS: the patient was seen and examined on 01/03/2022   Brief hospital course: Brittany Irwin was admitted to the hospital with the working diagnosis of decompensated heart failure.   81 yo female with the past medical history of hypertension, peripheral neuropathy, COPD, chronic hypoxemic respiratory failure, hypertrophic cardiomyopathy, heart failure, who presented with dyspnea. Reported worsening symptoms for 2 days. EMS found her hypotensive with systolic blood pressure in the 90's. In the ED her blood pressure was 114/55, HR 59, RR 19 and 02 saturation 95% on 4 L/min per [place]. Lungs with no wheezing, heart with S1 and S2 present and rhythmic, abdomen soft and no lower extremity edema.   Na 138, K 4,6, CL 100 bicarbonate 26 glucose 104 bun 22 cr 1,11 BNP 574 High sensitive troponin 139 and 211  Wbc 6.8 hgb 11,5 plt 272   Chest radiograph with cardiomegaly, bilateral hilar vascular congestion, with cephalization of the vasculature and interstitial infiltrates predominant at bases.   EKG 56 bpm, left axis deviation, left anterior fascicular block, first degree AV block, sinus rhythm, with no significant ST segment or T wave changes.        Assessment and Plan: * Acute CHF (congestive heart failure) (HCC) Echocardiogram with preserved LV systolic function with EF 60 to 65%, preserved RV systolic function. Small pericardial effusion.   Elevated troponin elevation due to heart failure exacerbation. No wall motion abnormalities on echocardiogram.   Documented urine output is 300 cc  Blood pressure systolic is 126 to 130 mmHg.  Oxymetry is 98% on 3 L/min per [place]  Plan to stop furosemide today and reassess volume status in am. Continue blood pressure monitoring.   Acute on chronic hypoxemic respiratory failure due to acute pulmonary edema.   Essential hypertension Resume atenolol for blood  pressure control Continue telemetry monitoring.   Chronic kidney disease, stage 3a (HCC) Renal function stable with a serum cr at 1,17, K is 4,2 and serum bicarbonate at 27. Hold on furosemide for today and follow up renal function in am.   Chronic anemia Anemia of chronic disease hgb is 10,1 and plt at 236   Crohn disease (HCC) No acute exacerbation.  Follow up as outpatient.   Chronic back pain Resume oral analgesic regimen. PT and OT        Subjective: Patient with improvement in dyspnea, no chest pain., positive back pain.   Physical Exam: Vitals:   01/03/22 0800 01/03/22 0907 01/03/22 1002 01/03/22 1032  BP:    138/61  Pulse: 66 (!) 55 (!) 58 (!) 53  Resp:  16  17  Temp:    97.8 F (36.6 C)  TempSrc:    Oral  SpO2: 98% 98% 95% 96%  Weight:      Height:       Neurology awake and alert ENT with mild pallor Cardiovascular with S1 and S2 present and rhythmic with no gallops No JVD No lower extremity edema Respiratory with mild rales at bases Abdomen not distended  Data Reviewed:    Family Communication: I spoke with patient's sister and nice at the bedside, we talked in detail about patient's condition, plan of care and prognosis and all questions were addressed.   Disposition: Status is: Inpatient Remains inpatient appropriate because: heart failure with respiratory failure   Planned Discharge Destination: Home     Author: Coralie Keens, MD 01/03/2022 3:02 PM  For  on call review [email or web address].

## 2022-01-03 NOTE — Assessment & Plan Note (Addendum)
Patient tolerated well diuresis with furosemide, at the time of her discharge her renal function had a serum cr of 1.11 with K at 4,0 and serum bicarbonate at 25.  Plan to continue diuresis with furosemide and as needed metolazone. Added SGLT2 inhibitor.

## 2022-01-03 NOTE — Assessment & Plan Note (Signed)
No acute exacerbation.  Follow up as outpatient.

## 2022-01-03 NOTE — Progress Notes (Signed)
Patient complained of muscle cramps on right foot. She stated she is taking Baclofen 10 mg PO at home for muscle spasm. Informed J. Howerter, DO. Will follow up.

## 2022-01-03 NOTE — Progress Notes (Signed)
TR[facility] floor coverage for both [facility] and [facility] (remote) on night of 01/02/22 into morning of 01/03/22:    I was notified by RN that patient's high-sensitivity troponin I drawn around 2330 on 6/16 is trending down.  No chest pain at this time.  Additionally, the patient is requesting resumption of home baclofen in the setting of chronic muscle spasms.  I subsequently placed order to resume baclofen, but on a prn basis.      Newton Pigg, DO Hospitalist

## 2022-01-03 NOTE — Hospital Course (Addendum)
Brittany Irwin was admitted to the hospital with the working diagnosis of decompensated heart failure.   81 yo female with the past medical history of hypertension, peripheral neuropathy, COPD, chronic hypoxemic respiratory failure, hypertrophic cardiomyopathy, heart failure, who presented with dyspnea. Reported worsening symptoms for 2 days. EMS found her hypotensive with systolic blood pressure in the 90's. In the ED her blood pressure was 114/55, HR 59, RR 19 and 02 saturation 95% on 4 L/min per [place]. Lungs with no wheezing, heart with S1 and S2 present and rhythmic, abdomen soft and no lower extremity edema.   Na 138, K 4,6, CL 100 bicarbonate 26 glucose 104 bun 22 cr 1,11 BNP 574 High sensitive troponin 139 and 211  Wbc 6.8 hgb 11,5 plt 272   Chest radiograph with cardiomegaly, bilateral hilar vascular congestion, with cephalization of the vasculature and interstitial infiltrates predominant at bases.   EKG 56 bpm, left axis deviation, left anterior fascicular block, first degree AV block, sinus rhythm, with no significant ST segment or T wave changes.   Patient was placed on furosemide for diuresis with good toleration.

## 2022-01-03 NOTE — Assessment & Plan Note (Addendum)
Continue with atenolol for blood pressure control

## 2022-01-03 NOTE — Plan of Care (Signed)
  Problem: Education: Goal: Knowledge of General Education information will improve Description: Including pain rating scale, medication(s)/side effects and non-pharmacologic comfort measures Outcome: Progressing   Problem: Health Behavior/Discharge Planning: Goal: Ability to manage health-related needs will improve Outcome: Progressing   Problem: Clinical Measurements: Goal: Cardiovascular complication will be avoided Outcome: Progressing   Problem: Coping: Goal: Level of anxiety will decrease Outcome: Progressing   Problem: Safety: Goal: Ability to remain free from injury will improve Outcome: Progressing   Problem: Pain Managment: Goal: General experience of comfort will improve Outcome: Progressing   Problem: Cardiac: Goal: Ability to achieve and maintain adequate cardiopulmonary perfusion will improve Outcome: Progressing

## 2022-01-03 NOTE — Progress Notes (Signed)
  Echocardiogram 2D Echocardiogram has been performed.  Rodrigo Ran 01/03/2022, 10:04 AM

## 2022-01-03 NOTE — Assessment & Plan Note (Addendum)
Echocardiogram with preserved LV systolic function with EF 60 to 65%, preserved RV systolic function. Small pericardial effusion.   Elevated troponin elevation due to heart failure exacerbation. No wall motion abnormalities on echocardiogram.   Patient's volume improved with diuretic therapy. Clinically negative fluid balance was achieved.  Patient's symptoms have improved.   Plan to continue furosemide at home 60 mg bid and as needed metolazone in case of weight gain.  Added SGLT2 inh Advice about fluid and salt restrictions.  Plan to follow up as outpatient.

## 2022-01-04 LAB — BASIC METABOLIC PANEL
Anion gap: 10 (ref 5–15)
BUN: 24 mg/dL — ABNORMAL HIGH (ref 8–23)
CO2: 25 mmol/L (ref 22–32)
Calcium: 8.8 mg/dL — ABNORMAL LOW (ref 8.9–10.3)
Chloride: 104 mmol/L (ref 98–111)
Creatinine, Ser: 1.11 mg/dL — ABNORMAL HIGH (ref 0.44–1.00)
GFR, Estimated: 51 mL/min — ABNORMAL LOW (ref >60)
Glucose, Bld: 103 mg/dL — ABNORMAL HIGH (ref 70–99)
Potassium: 4 mmol/L (ref 3.5–5.1)
Sodium: 139 mmol/L (ref 135–145)

## 2022-01-04 LAB — GLUCOSE, CAPILLARY: Glucose-Capillary: 94 mg/dL (ref 70–99)

## 2022-01-04 MED ORDER — METOLAZONE 5 MG PO TABS: 5.0000 mg | ORAL_TABLET | Freq: Every day | ORAL | 0 refills | Status: DC | PRN

## 2022-01-04 MED ORDER — METOLAZONE 5 MG PO TABS: 5.0000 mg | ORAL_TABLET | Freq: Every day | ORAL | Status: DC | PRN

## 2022-01-04 MED ORDER — DAPAGLIFLOZIN PROPANEDIOL 10 MG PO TABS
10.0000 mg | ORAL_TABLET | Freq: Every day | ORAL | 0 refills | Status: AC
Start: 2022-01-04 — End: 2022-02-03

## 2022-01-04 MED ORDER — LOSARTAN POTASSIUM 25 MG PO TABS: 25.0000 mg | ORAL_TABLET | Freq: Every day | ORAL | Status: DC

## 2022-01-04 MED ORDER — DAPAGLIFLOZIN PROPANEDIOL 10 MG PO TABS
10.0000 mg | ORAL_TABLET | Freq: Every day | ORAL | Status: DC
  Administered 2022-01-04: 10 mg via ORAL
  Filled 2022-01-04: qty 1

## 2022-01-04 NOTE — Discharge Summary (Addendum)
Physician Discharge Summary   Patient: Brittany Irwin MRN: [id number] DOB: 1940-09-22  Admit date:     01/02/2022  Discharge date: 01/04/22  Discharge Physician: [name]   PCP: [place]   Recommendations at discharge:    Patient will continue taking furosemide 60 mg po bid and as needed metolazone in case of weigh gain 2 to 3 lbs in 24 hrs or 5 lbs in 7 days.  Added SGLT2 inhibitor Continue salt and fluid restriction Follow up with cardiology as outpatient.   Discharge Diagnoses: Principal Problem:   Acute CHF (congestive heart failure) (HCC) Active Problems:   Essential hypertension   Chronic kidney disease, stage 3a (HCC)   Chronic anemia   Crohn disease (HCC)   Chronic back pain  Resolved Problems:   * No resolved hospital problems. [facility] Course: [name] was admitted to the hospital with the working diagnosis of decompensated heart failure.   81 yo female with the past medical history of hypertension, peripheral neuropathy, COPD, chronic hypoxemic respiratory failure, hypertrophic cardiomyopathy, heart failure, who presented with dyspnea. Reported worsening symptoms for 2 days. EMS found her hypotensive with systolic blood pressure in the 90's. In the ED her blood pressure was 114/55, HR 59, RR 19 and 02 saturation 95% on 4 L/min per [place]. Lungs with no wheezing, heart with S1 and S2 present and rhythmic, abdomen soft and no lower extremity edema.   Na 138, K 4,6, CL 100 bicarbonate 26 glucose 104 bun 22 cr 1,11 BNP 574 High sensitive troponin 139 and 211  Wbc 6.8 hgb 11,5 plt 272   Chest radiograph with cardiomegaly, bilateral hilar vascular congestion, with cephalization of the vasculature and interstitial infiltrates predominant at bases.   EKG 56 bpm, left axis deviation, left anterior fascicular block, first degree AV block, sinus rhythm, with no significant ST segment or T wave changes.   Patient was placed on furosemide  for diuresis with good toleration.   Assessment and Plan: * Acute CHF (congestive heart failure) (HCC) Echocardiogram with preserved LV systolic function with EF 60 to 65%, preserved RV systolic function. Small pericardial effusion.   Elevated troponin elevation due to heart failure exacerbation. No wall motion abnormalities on echocardiogram.   Patient's volume improved with diuretic therapy. Clinically negative fluid balance was achieved.  Patient's symptoms have improved.   Plan to continue furosemide at home 60 mg bid and as needed metolazone in case of weight gain.  Added SGLT2 inh Advice about fluid and salt restrictions.  Plan to follow up as outpatient.   Essential hypertension Continue with atenolol for blood pressure control   Chronic kidney disease, stage 3a ([place]) Patient tolerated well diuresis with furosemide, at the time of her discharge her renal function had a serum cr of 1.11 with K at 4,0 and serum bicarbonate at 25.  Plan to continue diuresis with furosemide and as needed metolazone. Added SGLT2 inhibitor.   Chronic anemia Anemia of chronic disease hgb is 10,1 and plt at 236   Crohn disease (HCC) No acute exacerbation.  Follow up as outpatient.   Chronic back pain Continue with oral analgesics at home.          Consultants: none  Procedures performed: none   Disposition: Home Diet recommendation:  Cardiac diet DISCHARGE MEDICATION: Allergies as of 01/04/2022       Reactions   Azulfidine [sulfasalazine] Other (See Comments)   Headache    Epipen [epinephrine] Hypertension   Flagyl [metronidazole]  Hives   Motrin [ibuprofen] Nausea Only, Other (See Comments)   Told not to take medication due to GI distress caused by crohn's disease.   Nsaids Other (See Comments)   Told not to take NSAIDs due to GI distress caused by crohn's disease   Penicillins Hives   Morphine And Related Other (See Comments)   Told not to take due to GI distress caused by  crohn's disease        Medication List     TAKE these medications    acetaminophen-codeine 300-60 MG tablet Commonly known as: TYLENOL #4 Take 1 tablet by mouth 4 (four) times daily as needed for pain.   ALPRAZolam 1 MG tablet Commonly known as: XANAX Take 1 mg by mouth 3 (three) times daily as needed for anxiety.   atenolol 25 MG tablet Commonly known as: TENORMIN Take 12.5-25 mg by mouth See admin instructions. 12.5 mg if heart rate < 60 25 mg if heart rate > 60   AYR SALINE NASAL GEL NA Place 1 Application into the nose 2 (two) times daily as needed (Dry nose).   baclofen 10 MG tablet Commonly known as: LIORESAL Take 10 mg by mouth 2 (two) times daily as needed for muscle spasms.   carbamazepine 200 MG 12 hr capsule Commonly known as: CARBATROL Take 400 mg by mouth 2 (two) times daily.   cyanocobalamin 1000 MCG/ML injection Commonly known as: (VITAMIN B-12) Inject 1,000 mcg into the muscle every 30 (thirty) days.   dapagliflozin propanediol 10 MG Tabs tablet Commonly known as: FARXIGA Take 1 tablet (10 mg total) by mouth daily.   ergocalciferol 1.25 MG (50000 UT) capsule Commonly known as: VITAMIN D2 Take 50,000 Units by mouth every Friday.   ezetimibe 10 MG tablet Commonly known as: ZETIA Take 10 mg by mouth daily.   fenofibrate micronized 200 MG capsule Commonly known as: LOFIBRA Take 200 mg by mouth daily.   furosemide 20 MG tablet Commonly known as: LASIX Take 60 mg by mouth in the morning and at bedtime.   gabapentin 100 MG capsule Commonly known as: NEURONTIN Take 100 mg by mouth daily.   ICY HOT BACK EX Apply 1 Application topically 2 (two) times daily as needed (back pain).   levalbuterol 45 MCG/ACT inhaler Commonly known as: XOPENEX HFA Inhale 2 puffs into the lungs every 6 (six) hours as needed for shortness of breath or wheezing.   losartan 25 MG tablet Commonly known as: COZAAR Take 1 tablet (25 mg total) by mouth daily. Start  taking on: January 05, 2022   metolazone 5 MG tablet Commonly known as: ZAROXOLYN Take 1 tablet (5 mg total) by mouth daily as needed (edema). Please take as needed in case of weight gain 3 lbs in 24 to 48 hrs or 5 lbs in 7 days.   nitroGLYCERIN 0.4 MG SL tablet Commonly known as: NITROSTAT Place 0.4 mg under the tongue every 5 (five) minutes x 3 doses as needed for chest pain.   pantoprazole 40 MG tablet Commonly known as: PROTONIX Take 40 mg by mouth daily.   potassium chloride SA 20 MEQ tablet Commonly known as: KLOR-CON M Take 20 mEq by mouth daily.   Trelegy Ellipta 200-62.5-25 MCG/ACT Aepb Generic drug: Fluticasone-Umeclidin-Vilant Inhale 1 puff into the lungs daily.   Voltaren Arthritis Pain 1 % Gel Generic drug: diclofenac Sodium Apply 1 Application topically 2 (two) times daily as needed (back pain).        Discharge Exam: [employer]  01/02/22 2300 01/03/22 0500 01/04/22 0705  Weight: 71.7 kg 71.7 kg 71.3 kg   BP (!) 140/57 (BP Location: Right Arm)   Pulse 65   Temp 97.8 F (36.6 C) (Oral)   Resp 20   Ht 5\' 4"  (1.626 m)   Wt 71.3 kg   SpO2 96%   BMI 26.98 kg/m   Patient with no chest pain or dyspnea, no edema  Neurology awake and alert ENT with mild pallor Cardiovascular with S1 and S2 present and rhythmic, no gallops, rubs or murmurs Respiratory with no rales or wheezing Abdomen not distended No lower extremity edema.   Condition at discharge: stable  The results of significant diagnostics from this hospitalization (including imaging, microbiology, ancillary and laboratory) are listed below for reference.   Imaging Studies: ECHOCARDIOGRAM COMPLETE  Result Date: 01/03/2022    ECHOCARDIOGRAM REPORT   Patient Name:   [facility] Date of Exam: 01/03/2022 Medical Rec #:  01/05/2022       Height:       64.0 in Accession #:    [id number]      Weight:       158.1 lb Date of Birth:  1941/07/01      BSA:          1.770 m Patient Age:    80 years         BP:           117/67 mmHg Patient Gender: F               HR:           57 bpm. Exam Location:  Inpatient Procedure: 2D Echo, Cardiac Doppler and Color Doppler Indications:    Congestive heart faillure  History:        Patient has no prior history of Echocardiogram examinations.                 Hypertrophic Cardiomyopathy and CHF, COPD,                 Signs/Symptoms:Dyspnea; Risk Factors:Hypertension.  Sonographer:    06/10/1941 RCS Referring Phys: Rodrigo Ran CAROLE N HALL IMPRESSIONS  1. Left ventricular ejection fraction, by estimation, is 60 to 65%. The left ventricle has normal function. The left ventricle has no regional wall motion abnormalities. There is moderate concentric left ventricular hypertrophy. Left ventricular diastolic parameters are consistent with Grade I diastolic dysfunction (impaired relaxation). Elevated left ventricular end-diastolic pressure.  2. Right ventricular systolic function is normal. The right ventricular size is normal. There is mildly elevated pulmonary artery systolic pressure.  3. Left atrial size was mildly dilated.  4. A small pericardial effusion is present. There is no evidence of cardiac tamponade.  5. The mitral valve is normal in structure. Trivial mitral valve regurgitation. No evidence of mitral stenosis. Moderate mitral annular calcification.  6. The aortic valve is tricuspid. Aortic valve regurgitation is trivial. No aortic stenosis is present.  7. Aortic dilatation noted. There is borderline dilatation of the ascending aorta, measuring 36 mm.  8. The inferior vena cava is dilated in size with <50% respiratory variability, suggesting right atrial pressure of 15 mmHg. FINDINGS  Left Ventricle: Left ventricular ejection fraction, by estimation, is 60 to 65%. The left ventricle has normal function. The left ventricle has no regional wall motion abnormalities. The left ventricular internal cavity size was normal in size. There is  moderate concentric left ventricular  hypertrophy. Left ventricular diastolic parameters are consistent with Grade I  diastolic dysfunction (impaired relaxation). Elevated left ventricular end-diastolic pressure. Right Ventricle: The right ventricular size is normal. No increase in right ventricular wall thickness. Right ventricular systolic function is normal. There is mildly elevated pulmonary artery systolic pressure. The tricuspid regurgitant velocity is 2.68  m/s, and with an assumed right atrial pressure of 15 mmHg, the estimated right ventricular systolic pressure is 43.7 mmHg. Left Atrium: Left atrial size was mildly dilated. Right Atrium: Right atrial size was normal in size. Pericardium: A small pericardial effusion is present. There is no evidence of cardiac tamponade. Mitral Valve: The mitral valve is normal in structure. Moderate mitral annular calcification. Trivial mitral valve regurgitation. No evidence of mitral valve stenosis. Tricuspid Valve: The tricuspid valve is normal in structure. Tricuspid valve regurgitation is trivial. No evidence of tricuspid stenosis. Aortic Valve: The aortic valve is tricuspid. Aortic valve regurgitation is trivial. No aortic stenosis is present. Aortic valve mean gradient measures 3.0 mmHg. Aortic valve peak gradient measures 5.6 mmHg. Aortic valve area, by VTI measures 2.66 cm. Pulmonic Valve: The pulmonic valve was normal in structure. Pulmonic valve regurgitation is trivial. No evidence of pulmonic stenosis. Aorta: Aortic dilatation noted. There is borderline dilatation of the ascending aorta, measuring 36 mm. Venous: The inferior vena cava is dilated in size with less than 50% respiratory variability, suggesting right atrial pressure of 15 mmHg. IAS/Shunts: No atrial level shunt detected by color flow Doppler.  LEFT VENTRICLE PLAX 2D LVIDd:         4.20 cm     Diastology LVIDs:         2.90 cm     LV e' medial:    3.15 cm/s LV PW:         1.56 cm     LV E/e' medial:  37.0 LV IVS:        1.69 cm     LV  e' lateral:   3.37 cm/s LVOT diam:     2.10 cm     LV E/e' lateral: 34.6 LV SV:         87 LV SV Index:   49 LVOT Area:     3.46 cm  LV Volumes (MOD) LV vol d, MOD A2C: 40.4 ml LV vol d, MOD A4C: 44.6 ml LV vol s, MOD A2C: 22.1 ml LV vol s, MOD A4C: 14.9 ml LV SV MOD A2C:     18.3 ml LV SV MOD A4C:     44.6 ml LV SV MOD BP:      25.6 ml RIGHT VENTRICLE             IVC RV Basal diam:  3.20 cm     IVC diam: 2.40 cm RV Mid diam:    1.40 cm RV S prime:     11.80 cm/s TAPSE (M-mode): 2.1 cm LEFT ATRIUM             Index        RIGHT ATRIUM           Index LA diam:        4.90 cm 2.77 cm/m   RA Area:     12.50 cm LA Vol (A2C):   50.5 ml 28.53 ml/m  RA Volume:   25.60 ml  14.46 ml/m LA Vol (A4C):   47.8 ml 27.00 ml/m LA Biplane Vol: 53.7 ml 30.34 ml/m  AORTIC VALVE  PULMONIC VALVE AV Area (Vmax):    3.20 cm     PV Vmax:          1.23 m/s AV Area (Vmean):   3.46 cm     PV Peak grad:     6.1 mmHg AV Area (VTI):     2.66 cm     PR End Diast Vel: 7.40 msec AV Vmax:           118.00 cm/s AV Vmean:          88.100 cm/s AV VTI:            0.328 m AV Peak Grad:      5.6 mmHg AV Mean Grad:      3.0 mmHg LVOT Vmax:         109.00 cm/s LVOT Vmean:        87.900 cm/s LVOT VTI:          0.252 m LVOT/AV VTI ratio: 0.77  AORTA Ao Root diam: 3.50 cm Ao Asc diam:  3.60 cm MITRAL VALVE                TRICUSPID VALVE MV Area (PHT): 5.46 cm     TR Peak grad:   28.7 mmHg MV Decel Time: 139 msec     TR Vmax:        268.00 cm/s MR Peak grad: 111.9 mmHg MR Vmax:      529.00 cm/s   SHUNTS MV E velocity: 116.50 cm/s  Systemic VTI:  0.25 m MV A velocity: 109.00 cm/s  Systemic Diam: 2.10 cm MV E/A ratio:  1.07 Skeet Latch MD Electronically signed by Skeet Latch MD Signature Date/Time: 01/03/2022/1:19:37 PM    Final    CT ABDOMEN PELVIS W CONTRAST  Result Date: 01/02/2022 CLINICAL DATA:  Abdominal pain. EXAM: CT ABDOMEN AND PELVIS WITH CONTRAST TECHNIQUE: Multidetector CT imaging of the abdomen and pelvis was  performed using the standard protocol following bolus administration of intravenous contrast. RADIATION DOSE REDUCTION: This exam was performed according to the departmental dose-optimization program which includes automated exposure control, adjustment of the mA and/or kV according to patient size and/or use of iterative reconstruction technique. CONTRAST:  151mL OMNIPAQUE IOHEXOL 300 MG/ML  SOLN COMPARISON:  None Available. FINDINGS: Lower chest: There are bibasilar linear atelectasis/scarring. Cluster of nodularity at the right lung base may be chronic scarring. Atypical infiltrate or aspiration is not excluded clinical correlation is recommended. Trace pleural effusions suspected. There is coronary vascular calcification. No intra-abdominal free air or free fluid. Hepatobiliary: The liver is unremarkable. There is mild dilatation of the biliary tree, likely post cholecystectomy. No retained calcified stone noted in the central CBD. Pancreas: Unremarkable. No pancreatic ductal dilatation or surrounding inflammatory changes. Spleen: Normal in size without focal abnormality. Adrenals/Urinary Tract: The adrenal glands are unremarkable. Several small left renal cysts. There is no hydronephrosis on either side. There is symmetric enhancement of the kidneys. The visualized ureters and urinary bladder appear unremarkable. Stomach/Bowel: There is postsurgical changes of right hemicolectomy with ileotransverse anastomosis in the right upper abdomen. There is sigmoid diverticulosis without active inflammatory changes. There is no bowel obstruction or active inflammation. Appendectomy. Vascular/Lymphatic: Mild aortoiliac atherosclerotic disease. The IVC is unremarkable. No portal venous gas. There is no adenopathy. Reproductive: Hysterectomy.  No adnexal masses. Other: Midline vertical anterior pelvic wall incisional scar. Musculoskeletal: Osteopenia with degenerative changes of the spine. No acute osseous pathology.  IMPRESSION: 1. No acute intra-abdominal or pelvic pathology. 2. Sigmoid diverticulosis  without active inflammatory changes. 3. Postsurgical changes of right hemicolectomy with ileotransverse anastomosis in the right upper abdomen. No bowel obstruction. 4. Cluster of nodularity at the right lung base may be chronic scarring. Atypical infiltrate or aspiration is not excluded clinical correlation is recommended. Trace pleural effusions suspected. 5. Aortic Atherosclerosis (ICD10-I70.0). Electronically Signed   By: Anner Crete M.D.   On: 01/02/2022 20:13   DG Chest Portable 1 View  Result Date: 01/02/2022 CLINICAL DATA:  Patient rotation EXAM: PORTABLE CHEST 1 VIEW COMPARISON:  Same day PA and lateral chest x-ray. FINDINGS: Mild cardiomegaly. Lower lung predominant interstitial opacities. More focal linear opacities are seen in the right lower lung. No evidence of pleural effusion or pneumothorax. IMPRESSION: 1. Lower lung predominant interstitial opacities, likely due to pulmonary edema. 2. More focal linear opacities are seen in the right lower lung, possibly due to scarring. Recommend correlation with outside prior imaging. Electronically Signed   By: Yetta Glassman M.D.   On: 01/02/2022 18:22   DG Chest 2 View  Result Date: 01/02/2022 CLINICAL DATA:  Dib. Per EMS pt states that she was "not feeling well" and took 1 Nitro Sublingual, 1 Xanax and 1 Atenolol. Pt denied having CP. Pt also c/o Increased SHOB w/ exertion, weakness x2 days. EXAM: CHEST - 2 VIEW.  Patient is rotated on the frontal view. COMPARISON:  None available FINDINGS: The heart and mediastinal contours are within normal limits. Bibasilar atelectasis. Increased interstitial markings and Kerley B lines. No pulmonary edema. Trace bilateral pleural effusions. No pneumothorax. No acute osseous abnormality. IMPRESSION: 1. Pulmonary edema with trace bilateral pleural effusions. 2. Markedly rotated patient on frontal view due to patient  rotation. Recommend repeat PA radiograph. Electronically Signed   By: Iven Finn M.D.   On: 01/02/2022 17:06    Microbiology: Results for orders placed or performed during the hospital encounter of 01/02/22  Resp Panel by RT-PCR (Flu A&B, Covid) Anterior Nasal Swab     Status: None   Collection Time: 01/02/22  4:36 PM   Specimen: Anterior Nasal Swab  Result Value Ref Range Status   SARS Coronavirus 2 by RT PCR NEGATIVE NEGATIVE Final    Comment: (NOTE) SARS-CoV-2 target nucleic acids are NOT DETECTED.  The SARS-CoV-2 RNA is generally detectable in upper respiratory specimens during the acute phase of infection. The lowest concentration of SARS-CoV-2 viral copies this assay can detect is 138 copies/mL. A negative result does not preclude SARS-Cov-2 infection and should not be used as the sole basis for treatment or other patient management decisions. A negative result may occur with  improper specimen collection/handling, submission of specimen other than nasopharyngeal swab, presence of viral mutation(s) within the areas targeted by this assay, and inadequate number of viral copies(<138 copies/mL). A negative result must be combined with clinical observations, patient history, and epidemiological information. The expected result is Negative.  Fact Sheet for Patients:  EntrepreneurPulse.com.au  Fact Sheet for Healthcare Providers:  IncredibleEmployment.be  This test is no t yet approved or cleared by the [place] FDA and  has been authorized for detection and/or diagnosis of SARS-CoV-2 by FDA under an Emergency Use Authorization (EUA). This EUA will remain  in effect (meaning this test can be used) for the duration of the COVID-19 declaration under Section 564(b)(1) of the Act, 21 U.S.C.section 360bbb-3(b)(1), unless the authorization is terminated  or revoked sooner.       Influenza A by PCR NEGATIVE NEGATIVE Final   Influenza B  by PCR NEGATIVE  NEGATIVE Final    Comment: (NOTE) The Xpert Xpress SARS-CoV-2/FLU/RSV plus assay is intended as an aid in the diagnosis of influenza from Nasopharyngeal swab specimens and should not be used as a sole basis for treatment. Nasal washings and aspirates are unacceptable for Xpert Xpress SARS-CoV-2/FLU/RSV testing.  Fact Sheet for Patients: EntrepreneurPulse.com.au  Fact Sheet for Healthcare Providers: IncredibleEmployment.be  This test is not yet approved or cleared by the [place] FDA and has been authorized for detection and/or diagnosis of SARS-CoV-2 by FDA under an Emergency Use Authorization (EUA). This EUA will remain in effect (meaning this test can be used) for the duration of the COVID-19 declaration under Section 564(b)(1) of the Act, 21 U.S.C. section 360bbb-3(b)(1), unless the authorization is terminated or revoked.  Performed at [facility], [place] [address], [place], [place] [address]   Expectorated Sputum Assessment w Gram Stain, Rflx to Resp Cult     Status: None   Collection Time: 01/03/22  6:59 AM   Specimen: Expectorated Sputum  Result Value Ref Range Status   Specimen Description EXPECTORATED SPUTUM  Final   Special Requests NONE  Final   Sputum evaluation   Final    THIS SPECIMEN IS ACCEPTABLE FOR SPUTUM CULTURE Performed at [facility], [place] [address], [place], [place] [address]    Report Status 01/03/2022 FINAL  Final  Culture, Respiratory w Gram Stain     Status: None (Preliminary result)   Collection Time: 01/03/22  6:59 AM  Result Value Ref Range Status   Specimen Description EXPECTORATED SPUTUM  Final   Special Requests NONE Reflexed from [id number]  Final   Gram Stain   Final    FEW WBC PRESENT,BOTH PMN AND MONONUCLEAR FEW GRAM POSITIVE COCCI IN PAIRS IN CLUSTERS FEW GRAM POSITIVE RODS FEW BUDDING YEAST SEEN FEW GRAM NEGATIVE RODS    Culture   Final    CULTURE REINCUBATED FOR  BETTER GROWTH Performed at [facility], [place] [address] [place], [place] [address]    Report Status PENDING  Incomplete    Labs: CBC: Recent Labs  Lab 01/02/22 1635 01/02/22 1644 01/02/22 2144 01/02/22 2341  WBC 6.8  --  5.9 5.6  NEUTROABS 5.3  --   --  4.0  HGB 11.5* 11.2* 10.5* 10.1*  HCT 35.5* 33.0* 31.3* 31.1*  MCV 96.5  --  95.1 94.2  PLT 272  --  238 [id number]   Basic Metabolic Panel: Recent Labs  Lab 01/02/22 1635 01/02/22 1644 01/02/22 2144 01/02/22 2341 01/04/22 0132  NA 138 138  --  137 139  K 4.6 4.5  --  4.2 4.0  CL 100  --   --  100 104  CO2 26  --   --  27 25  GLUCOSE 104*  --   --  109* 103*  BUN 22  --   --  21 24*  CREATININE 1.11*  --  1.15* 1.17* 1.11*  CALCIUM 8.8*  --   --  8.6* 8.8*  MG 2.1  --   --  2.2  --   PHOS  --   --   --  3.8  --    Liver Function Tests: Recent Labs  Lab 01/02/22 1635 01/02/22 2341  AST 20 19  ALT 6 7  ALKPHOS 33* 28*  BILITOT 0.6 0.5  PROT 6.6 6.2*  ALBUMIN 3.7 3.4*   CBG: Recent Labs  Lab 01/04/22 0548  GLUCAP 94    Discharge time  spent: greater than 30 minutes.  Signed: Tawni Millers, MD [facility] 01/04/2022

## 2022-01-04 NOTE — Plan of Care (Signed)
  Problem: Education: Goal: Knowledge of General Education information will improve Description: Including pain rating scale, medication(s)/side effects and non-pharmacologic comfort measures Outcome: Adequate for Discharge   Problem: Health Behavior/Discharge Planning: Goal: Ability to manage health-related needs will improve Outcome: Adequate for Discharge   Problem: Clinical Measurements: Goal: Ability to maintain clinical measurements within normal limits will improve Outcome: Adequate for Discharge Goal: Will remain free from infection Outcome: Adequate for Discharge Goal: Diagnostic test results will improve Outcome: Adequate for Discharge Goal: Respiratory complications will improve Outcome: Adequate for Discharge Goal: Cardiovascular complication will be avoided Outcome: Adequate for Discharge   Problem: Activity: Goal: Risk for activity intolerance will decrease Outcome: Adequate for Discharge   Problem: Nutrition: Goal: Adequate nutrition will be maintained Outcome: Adequate for Discharge   Problem: Coping: Goal: Level of anxiety will decrease Outcome: Adequate for Discharge   Problem: Elimination: Goal: Will not experience complications related to bowel motility Outcome: Adequate for Discharge Goal: Will not experience complications related to urinary retention Outcome: Adequate for Discharge   Problem: Pain Managment: Goal: General experience of comfort will improve Outcome: Adequate for Discharge   Problem: Safety: Goal: Ability to remain free from injury will improve Outcome: Adequate for Discharge   Problem: Skin Integrity: Goal: Risk for impaired skin integrity will decrease Outcome: Adequate for Discharge   Problem: Cardiac: Goal: Ability to achieve and maintain adequate cardiopulmonary perfusion will improve Outcome: Adequate for Discharge   

## 2022-01-05 LAB — CULTURE, RESPIRATORY W GRAM STAIN: Culture: NORMAL

## 2022-01-12 ENCOUNTER — Emergency Department (HOSPITAL_COMMUNITY)
Admission: EM | Admit: 2022-01-12 | Discharge: 2022-01-12 | Disposition: A | Discharge status: Home / Self Care | Payer: Medicare Other | Attending: Emergency Medicine | Admitting: Emergency Medicine

## 2022-01-12 ENCOUNTER — Encounter (HOSPITAL_COMMUNITY): Payer: Self-pay

## 2022-01-12 ENCOUNTER — Other Ambulatory Visit: Payer: Self-pay

## 2022-01-12 ENCOUNTER — Emergency Department (HOSPITAL_COMMUNITY): Payer: Medicare Other

## 2022-01-12 DIAGNOSIS — J449 Chronic obstructive pulmonary disease, unspecified: Secondary | ICD-10-CM | POA: Insufficient documentation

## 2022-01-12 DIAGNOSIS — I10 Essential (primary) hypertension: Secondary | ICD-10-CM | POA: Insufficient documentation

## 2022-01-12 DIAGNOSIS — Z79899 Other long term (current) drug therapy: Secondary | ICD-10-CM | POA: Diagnosis not present

## 2022-01-12 DIAGNOSIS — F419 Anxiety disorder, unspecified: Secondary | ICD-10-CM | POA: Insufficient documentation

## 2022-01-12 DIAGNOSIS — R002 Palpitations: Secondary | ICD-10-CM | POA: Diagnosis present

## 2022-01-12 DIAGNOSIS — R0602 Shortness of breath: Secondary | ICD-10-CM | POA: Insufficient documentation

## 2022-01-12 DIAGNOSIS — I2510 Atherosclerotic heart disease of native coronary artery without angina pectoris: Secondary | ICD-10-CM | POA: Insufficient documentation

## 2022-01-12 HISTORY — DX: Heart failure, unspecified: I50.9

## 2022-01-12 LAB — BASIC METABOLIC PANEL
Anion gap: 12 (ref 5–15)
BUN: 27 mg/dL — ABNORMAL HIGH (ref 8–23)
CO2: 24 mmol/L (ref 22–32)
Calcium: 8.8 mg/dL — ABNORMAL LOW (ref 8.9–10.3)
Chloride: 102 mmol/L (ref 98–111)
Creatinine, Ser: 1.05 mg/dL — ABNORMAL HIGH (ref 0.44–1.00)
GFR, Estimated: 54 mL/min — ABNORMAL LOW (ref >60)
Glucose, Bld: 108 mg/dL — ABNORMAL HIGH (ref 70–99)
Potassium: 4.1 mmol/L (ref 3.5–5.1)
Sodium: 138 mmol/L (ref 135–145)

## 2022-01-12 LAB — CBC WITH DIFFERENTIAL/PLATELET
Abs Immature Granulocytes: 0.02 10*3/uL (ref 0.00–0.07)
Basophils Absolute: 0.1 10*3/uL (ref 0.0–0.1)
Basophils Relative: 1 %
Eosinophils Absolute: 0.2 10*3/uL (ref 0.0–0.5)
Eosinophils Relative: 4 %
HCT: 36.6 % (ref 36.0–46.0)
Hemoglobin: 11.7 g/dL — ABNORMAL LOW (ref 12.0–15.0)
Immature Granulocytes: 0 %
Lymphocytes Relative: 13 %
Lymphs Abs: 0.8 10*3/uL (ref 0.7–4.0)
MCH: 30.5 pg (ref 26.0–34.0)
MCHC: 32 g/dL (ref 30.0–36.0)
MCV: 95.6 fL (ref 80.0–100.0)
Monocytes Absolute: 0.6 10*3/uL (ref 0.1–1.0)
Monocytes Relative: 8 %
Neutro Abs: 4.8 10*3/uL (ref 1.7–7.7)
Neutrophils Relative %: 74 %
Platelets: 258 10*3/uL (ref 150–400)
RBC: 3.83 MIL/uL — ABNORMAL LOW (ref 3.87–5.11)
RDW: 13.1 % (ref 11.5–15.5)
WBC: 6.6 10*3/uL (ref 4.0–10.5)
nRBC: 0 % (ref 0.0–0.2)

## 2022-01-12 LAB — BRAIN NATRIURETIC PEPTIDE: B Natriuretic Peptide: 353.2 pg/mL — ABNORMAL HIGH (ref 0.0–100.0)

## 2022-01-12 NOTE — ED Provider Notes (Signed)
  Face-to-face evaluation   History: Patient presents for evaluation of rapid heartbeat which she noticed after she got up.  Because of that she took atenolol and losartan, then called EMS.  She also took some nitroglycerin and this seemed to help her discomfort.  She states that by the time EMS arrived, her heart rate was more normal.  She feels like she has diuresed well following recent hospital discharge.  Physical exam: Elderly, alert and cooperative.  Heart rate 54, palpated right wrist normal.  No respiratory distress.  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  She has chronic mild troponin elevation suspected to be due to her HCM.   Medical screening examination/treatment/procedure(s) were conducted as a shared visit with non-physician practitioner(s) and myself.  I personally evaluated the patient during the encounter    Mancel Bale, MD 01/13/22 1949

## 2022-01-12 NOTE — ED Triage Notes (Signed)
Pt to ED via [facility] c/o palpitations. Hx of CHF. Pt dc'd a week ago.

## 2022-01-27 ENCOUNTER — Encounter (HOSPITAL_COMMUNITY): Payer: Self-pay

## 2022-01-27 ENCOUNTER — Other Ambulatory Visit: Payer: Self-pay

## 2022-01-27 ENCOUNTER — Emergency Department (HOSPITAL_COMMUNITY): Payer: Medicare Other

## 2022-01-27 ENCOUNTER — Emergency Department (HOSPITAL_COMMUNITY)
Admission: EM | Admit: 2022-01-27 | Discharge: 2022-01-28 | Disposition: A | Discharge status: Home / Self Care | Payer: Medicare Other | Attending: Emergency Medicine | Admitting: Emergency Medicine

## 2022-01-27 DIAGNOSIS — I509 Heart failure, unspecified: Secondary | ICD-10-CM | POA: Insufficient documentation

## 2022-01-27 DIAGNOSIS — R55 Syncope and collapse: Secondary | ICD-10-CM | POA: Insufficient documentation

## 2022-01-27 DIAGNOSIS — R42 Dizziness and giddiness: Secondary | ICD-10-CM | POA: Diagnosis not present

## 2022-01-27 DIAGNOSIS — R0602 Shortness of breath: Secondary | ICD-10-CM | POA: Diagnosis not present

## 2022-01-27 LAB — COMPREHENSIVE METABOLIC PANEL
ALT: 12 U/L (ref 0–44)
AST: 29 U/L (ref 15–41)
Albumin: 3.8 g/dL (ref 3.5–5.0)
Alkaline Phosphatase: 36 U/L — ABNORMAL LOW (ref 38–126)
Anion gap: 13 (ref 5–15)
BUN: 35 mg/dL — ABNORMAL HIGH (ref 8–23)
CO2: 24 mmol/L (ref 22–32)
Calcium: 8.9 mg/dL (ref 8.9–10.3)
Chloride: 103 mmol/L (ref 98–111)
Creatinine, Ser: 1.07 mg/dL — ABNORMAL HIGH (ref 0.44–1.00)
GFR, Estimated: 53 mL/min — ABNORMAL LOW (ref >60)
Glucose, Bld: 107 mg/dL — ABNORMAL HIGH (ref 70–99)
Potassium: 4 mmol/L (ref 3.5–5.1)
Sodium: 140 mmol/L (ref 135–145)
Total Bilirubin: 0.3 mg/dL (ref 0.3–1.2)
Total Protein: 6.8 g/dL (ref 6.5–8.1)

## 2022-01-27 LAB — CBC WITH DIFFERENTIAL/PLATELET
Abs Immature Granulocytes: 0.04 10*3/uL (ref 0.00–0.07)
Basophils Absolute: 0.1 10*3/uL (ref 0.0–0.1)
Basophils Relative: 1 %
Eosinophils Absolute: 0.2 10*3/uL (ref 0.0–0.5)
Eosinophils Relative: 4 %
HCT: 36.3 % (ref 36.0–46.0)
Hemoglobin: 11.7 g/dL — ABNORMAL LOW (ref 12.0–15.0)
Immature Granulocytes: 1 %
Lymphocytes Relative: 15 %
Lymphs Abs: 1 10*3/uL (ref 0.7–4.0)
MCH: 31 pg (ref 26.0–34.0)
MCHC: 32.2 g/dL (ref 30.0–36.0)
MCV: 96 fL (ref 80.0–100.0)
Monocytes Absolute: 0.8 10*3/uL (ref 0.1–1.0)
Monocytes Relative: 12 %
Neutro Abs: 4.5 10*3/uL (ref 1.7–7.7)
Neutrophils Relative %: 67 %
Platelets: 244 10*3/uL (ref 150–400)
RBC: 3.78 MIL/uL — ABNORMAL LOW (ref 3.87–5.11)
RDW: 12.7 % (ref 11.5–15.5)
WBC: 6.6 10*3/uL (ref 4.0–10.5)
nRBC: 0 % (ref 0.0–0.2)

## 2022-01-27 LAB — I-STAT VENOUS BLOOD GAS, ED
Acid-Base Excess: 5 mmol/L — ABNORMAL HIGH (ref 0.0–2.0)
Bicarbonate: 24.7 mmol/L (ref 20.0–28.0)
Calcium, Ion: 0.79 mmol/L — CL (ref 1.15–1.40)
HCT: 34 % — ABNORMAL LOW (ref 36.0–46.0)
Hemoglobin: 11.6 g/dL — ABNORMAL LOW (ref 12.0–15.0)
O2 Saturation: 100 %
Potassium: 3.8 mmol/L (ref 3.5–5.1)
Sodium: 138 mmol/L (ref 135–145)
TCO2: 25 mmol/L (ref 22–32)
pCO2, Ven: 22.5 mmHg — ABNORMAL LOW (ref 44–60)
pH, Ven: 7.648 (ref 7.25–7.43)
pO2, Ven: 213 mmHg — ABNORMAL HIGH (ref 32–45)

## 2022-01-27 LAB — BRAIN NATRIURETIC PEPTIDE: B Natriuretic Peptide: 361.9 pg/mL — ABNORMAL HIGH (ref 0.0–100.0)

## 2022-01-27 LAB — URINALYSIS, ROUTINE W REFLEX MICROSCOPIC
Bilirubin Urine: NEGATIVE
Glucose, UA: NEGATIVE mg/dL
Hgb urine dipstick: NEGATIVE
Ketones, ur: NEGATIVE mg/dL
Leukocytes,Ua: NEGATIVE
Nitrite: NEGATIVE
Protein, ur: NEGATIVE mg/dL
Specific Gravity, Urine: 1.013 (ref 1.005–1.030)
pH: 5 (ref 5.0–8.0)

## 2022-01-27 MED ORDER — BACLOFEN 10 MG PO TABS
10.0000 mg | ORAL_TABLET | Freq: Once | ORAL | Status: AC
Start: 2022-01-27 — End: 2022-01-27
  Administered 2022-01-27: 10 mg via ORAL
  Filled 2022-01-27: qty 1

## 2022-01-27 NOTE — ED Provider Notes (Signed)
Blood pressure (!) 129/58, pulse (!) 48, temperature 99.1 F (37.3 C), temperature source Oral, resp. rate 18, height 5\' 4"  (1.626 m), weight 69.6 kg, SpO2 99 %.  Assuming care from Dr. .  In short, Brittany Irwin is a 81 y.o. female with a chief complaint of Near Syncope .  Refer to the original H&P for additional details.  The current plan of care is to follow up on repeat VBG and Troponin.  01:00 AM  Repeat troponin of 218 similar to prior values.  pH/VBG of stabilized.  Patient is on her home oxygen here in the department and feeling well.  Home Tylenol No. 4 given for cramping in the legs which is an ongoing issue for her.  On reassessment, she is looking well and feeling improved.  She has a friend who will drive her home and has brought her oxygen concentrator with her to the hospital for transportation back. Discussed PCP follow up plan and ED return precautions.     77, MD 01/28/22 [phone]

## 2022-01-27 NOTE — ED Triage Notes (Signed)
Pt BIB [facility] from home after a near syncopal episode. Pt stated she walked outside to check the mail without her usual 4L [place] oxygen. Once she came back inside she felt lightheaded and sat down. Pt denies LOC. VSS, A&Ox4 HX- HTN, COPD, CHF

## 2022-01-27 NOTE — ED Notes (Signed)
MD Silverio Lay notified of troponin.

## 2022-01-27 NOTE — ED Provider Notes (Signed)
[facility] EMERGENCY DEPARTMENT Provider Note   CSN: [id number] Arrival date & time: 01/27/22  2020     History  Chief Complaint  Patient presents with   Near Syncope    Brittany Irwin is a 81 y.o. female history of heart failure on 4 L nasal cannula, here presenting with near syncope.  Patient states that she took off her oxygen and walked outside to check the mail.  She states that she came back and felt lightheaded and dizzy.  She states that she bent down to get something and really felt dizzy and she had a hard time getting up.  She states that she did end up putting her oxygen on and felt better.  Patient was recently admitted for CHF exacerbation.  She states that she has been monitoring her weight and it is very stable.  Patient has been taking her Lasix as prescribed.  The history is provided by the patient.       Home Medications Prior to Admission medications   Medication Sig Start Date End Date Taking? Authorizing Provider  acetaminophen-codeine (TYLENOL #4) 300-60 MG tablet Take 1 tablet by mouth 4 (four) times daily as needed for pain. 12/31/21   [provider]  Aloe-Sodium Chloride (AYR SALINE NASAL GEL NA) Place 1 Application into the nose 2 (two) times daily as needed (Dry nose).    [provider]  ALPRAZolam Prudy Feeler) 1 MG tablet Take 1 mg by mouth 3 (three) times daily as needed for anxiety. 01/02/22   [provider]  atenolol (TENORMIN) 25 MG tablet Take 12.5-25 mg by mouth See admin instructions. 12.5 mg if heart rate < 60 25 mg if heart rate > 60 01/02/22   [provider]  baclofen (LIORESAL) 10 MG tablet Take 10 mg by mouth 2 (two) times daily as needed for muscle spasms. 07/10/21   [provider]  carbamazepine (CARBATROL) 200 MG 12 hr capsule Take 400 mg by mouth 2 (two) times daily. 12/19/21   [provider]  cyanocobalamin (,VITAMIN B-12,) 1000 MCG/ML injection Inject 1,000 mcg into  the muscle every 30 (thirty) days.    [provider]  dapagliflozin propanediol (FARXIGA) 10 MG TABS tablet Take 1 tablet (10 mg total) by mouth daily. 01/04/22 02/03/22  Arrien, York Ram, MD  diclofenac Sodium (VOLTAREN ARTHRITIS PAIN) 1 % GEL Apply 1 Application topically 2 (two) times daily as needed (back pain).    [provider]  ergocalciferol (VITAMIN D2) 1.25 MG (50000 UT) capsule Take 50,000 Units by mouth every Friday.    [provider]  ezetimibe (ZETIA) 10 MG tablet Take 10 mg by mouth daily. 12/31/21   [provider]  fenofibrate micronized (LOFIBRA) 200 MG capsule Take 200 mg by mouth daily. 12/31/21   [provider]  furosemide (LASIX) 20 MG tablet Take 60 mg by mouth in the morning and at bedtime. 12/31/21   [provider]  gabapentin (NEURONTIN) 100 MG capsule Take 100 mg by mouth daily. 12/31/21   [provider]  levalbuterol Pauline Aus HFA) 45 MCG/ACT inhaler Inhale 2 puffs into the lungs every 6 (six) hours as needed for shortness of breath or wheezing. 12/23/21   [provider]  losartan (COZAAR) 25 MG tablet Take 1 tablet (25 mg total) by mouth daily. 01/05/22   Arrien, York Ram, MD  Menthol, Topical Analgesic, (ICY HOT BACK EX) Apply 1 Application topically 2 (two) times daily as needed (back pain).  [provider]  metolazone (ZAROXOLYN) 5 MG tablet Take 1 tablet (5 mg total) by mouth daily as needed (edema). Please take as needed in case of weight gain 3 lbs in 24 to 48 hrs or 5 lbs in 7 days. 01/04/22   Arrien, York Ram, MD  nitroGLYCERIN (NITROSTAT) 0.4 MG SL tablet Place 0.4 mg under the tongue every 5 (five) minutes x 3 doses as needed for chest pain. 12/31/21   [provider]  pantoprazole (PROTONIX) 40 MG tablet Take 40 mg by mouth daily. 12/31/21   [provider]  potassium chloride SA (KLOR-CON M) 20 MEQ tablet Take 20 mEq by mouth daily. 12/31/21    [provider]  Dwyane Luo 200-62.5-25 MCG/ACT AEPB Inhale 1 puff into the lungs daily. 12/31/21   [provider]      Allergies    Azulfidine [sulfasalazine], Epipen [epinephrine], Flagyl [metronidazole], Motrin [ibuprofen], Nsaids, Penicillins, and Morphine and related    Review of Systems   Review of Systems  Respiratory:  Positive for shortness of breath.   Neurological:  Positive for dizziness.  All other systems reviewed and are negative.   Physical Exam Updated Vital Signs BP (!) 113/53   Pulse 65   Temp 99.1 F (37.3 C) (Oral)   Resp 14   Ht 5\' 4"  (1.626 m)   Wt 69.6 kg   SpO2 95%   BMI 26.33 kg/m  Physical Exam Vitals and nursing note reviewed.  Constitutional:      Appearance: Normal appearance.     Comments: Chronically ill  HENT:     Head: Normocephalic.     Nose: Nose normal.     Mouth/Throat:     Mouth: Mucous membranes are moist.  Eyes:     Extraocular Movements: Extraocular movements intact.     Pupils: Pupils are equal, round, and reactive to light.  Cardiovascular:     Rate and Rhythm: Normal rate and regular rhythm.     Pulses: Normal pulses.     Heart sounds: Normal heart sounds.  Pulmonary:     Effort: Pulmonary effort is normal.     Comments: Diminished bilateral bases Abdominal:     General: Abdomen is flat.     Palpations: Abdomen is soft.  Musculoskeletal:        General: Normal range of motion.     Cervical back: Normal range of motion and neck supple.     Comments: No pitting edema  Skin:    General: Skin is warm.     Capillary Refill: Capillary refill takes less than 2 seconds.  Neurological:     General: No focal deficit present.     Mental Status: She is alert and oriented to person, place, and time.     Comments: Cranial nerves II to XII intact.  Normal strength bilateral arms and legs  Psychiatric:        Mood and Affect: Mood normal.        Behavior: Behavior normal.     ED Results / Procedures  / Treatments   Labs (all labs ordered are listed, but only abnormal results are displayed) Labs Reviewed  CBC WITH DIFFERENTIAL/PLATELET  COMPREHENSIVE METABOLIC PANEL  BRAIN NATRIURETIC PEPTIDE  URINALYSIS, ROUTINE W REFLEX MICROSCOPIC  I-STAT VENOUS BLOOD GAS, ED  TROPONIN I (HIGH SENSITIVITY)    EKG None  Radiology No results found.  Procedures Procedures    Medications Ordered in ED Medications - No data to display  ED Course/ Medical  Decision Making/ A&P                           Medical Decision Making Brittany Irwin is a 81 y.o. female here presenting with dizziness and near syncope.  I think this may be secondary to hypoxia.  She took off her oxygen and then walk to the mailbox and came back and became dizzy.  Patient's oxygen level is normal now that she is on 4 L nasal cannula which is baseline.  Consider heart failure exacerbation versus ACS.  Plan to get CBC and CMP and BNP and troponin and chest x-ray.   11:05 PM Patient's VBG showed pH is 7.648.  Troponin is 196 which is baseline.  BNP is 360.  Chest x-ray clear.  I signed out to Dr. Laverta Baltimore to follow-up repeat trop and VBG.  If it is stable, patient anticipate will be discharged back home.  I think her near syncope likely from hypoxia from not wearing her oxygen  Amount and/or Complexity of Data Reviewed Labs: ordered. Radiology: ordered.  Risk Prescription drug management.    Final Clinical Impression(s) / ED Diagnoses Final diagnoses:  None    Rx / DC Orders ED Discharge Orders     None         Drenda Freeze, MD 01/27/22 2308

## 2022-01-28 LAB — I-STAT VENOUS BLOOD GAS, ED
Acid-Base Excess: 3 mmol/L — ABNORMAL HIGH (ref 0.0–2.0)
Bicarbonate: 28.3 mmol/L — ABNORMAL HIGH (ref 20.0–28.0)
Calcium, Ion: 1.11 mmol/L — ABNORMAL LOW (ref 1.15–1.40)
HCT: 33 % — ABNORMAL LOW (ref 36.0–46.0)
Hemoglobin: 11.2 g/dL — ABNORMAL LOW (ref 12.0–15.0)
O2 Saturation: 100 %
Potassium: 4 mmol/L (ref 3.5–5.1)
Sodium: 141 mmol/L (ref 135–145)
TCO2: 30 mmol/L (ref 22–32)
pCO2, Ven: 44.4 mmHg (ref 44–60)
pH, Ven: 7.412 (ref 7.25–7.43)
pO2, Ven: 187 mmHg — ABNORMAL HIGH (ref 32–45)

## 2022-01-28 LAB — TROPONIN I (HIGH SENSITIVITY)
Troponin I (High Sensitivity): 196 ng/L (ref <18)
Troponin I (High Sensitivity): 218 ng/L (ref <18)

## 2022-01-28 MED ORDER — ACETAMINOPHEN-CODEINE #3 300-30 MG PO TABS
1.0000 | ORAL_TABLET | Freq: Once | ORAL | Status: AC
  Administered 2022-01-28: 1 via ORAL
  Filled 2022-01-28: qty 1

## 2022-01-28 NOTE — Discharge Instructions (Signed)
Please keep your home oxygen connected and take your home medications as prescribed. Return with any new or worsening symptoms.

## 2022-01-28 NOTE — ED Notes (Signed)
Patient verbalizes understanding of d/c instructions. Opportunities for questions and answers were provided. Pt d/c from ED and wheeled to lobby.  

## 2022-03-11 DIAGNOSIS — R0789 Other chest pain: Secondary | ICD-10-CM

## 2022-03-11 HISTORY — DX: Other chest pain: R07.89

## 2022-03-12 DIAGNOSIS — J9611 Chronic respiratory failure with hypoxia: Secondary | ICD-10-CM | POA: Insufficient documentation

## 2022-10-14 ENCOUNTER — Encounter (HOSPITAL_COMMUNITY): Payer: Self-pay

## 2022-10-14 ENCOUNTER — Emergency Department (HOSPITAL_COMMUNITY): Payer: Medicare Other

## 2022-10-14 ENCOUNTER — Inpatient Hospital Stay (HOSPITAL_COMMUNITY)
Admission: EM | Admit: 2022-10-14 | Discharge: 2022-10-17 | DRG: 281 | Disposition: A | Discharge status: Home Health | Payer: Medicare Other | Attending: Internal Medicine | Admitting: Internal Medicine

## 2022-10-14 ENCOUNTER — Other Ambulatory Visit: Payer: Self-pay

## 2022-10-14 DIAGNOSIS — I130 Hypertensive heart and chronic kidney disease with heart failure and stage 1 through stage 4 chronic kidney disease, or unspecified chronic kidney disease: Secondary | ICD-10-CM | POA: Diagnosis present

## 2022-10-14 DIAGNOSIS — R252 Cramp and spasm: Secondary | ICD-10-CM | POA: Diagnosis present

## 2022-10-14 DIAGNOSIS — R001 Bradycardia, unspecified: Secondary | ICD-10-CM | POA: Diagnosis present

## 2022-10-14 DIAGNOSIS — I21A1 Myocardial infarction type 2: Secondary | ICD-10-CM | POA: Diagnosis present

## 2022-10-14 DIAGNOSIS — I440 Atrioventricular block, first degree: Secondary | ICD-10-CM | POA: Diagnosis present

## 2022-10-14 DIAGNOSIS — G8929 Other chronic pain: Secondary | ICD-10-CM | POA: Diagnosis present

## 2022-10-14 DIAGNOSIS — Z90710 Acquired absence of both cervix and uterus: Secondary | ICD-10-CM

## 2022-10-14 DIAGNOSIS — R000 Tachycardia, unspecified: Principal | ICD-10-CM | POA: Diagnosis present

## 2022-10-14 DIAGNOSIS — Z79899 Other long term (current) drug therapy: Secondary | ICD-10-CM

## 2022-10-14 DIAGNOSIS — R7989 Other specified abnormal findings of blood chemistry: Secondary | ICD-10-CM

## 2022-10-14 DIAGNOSIS — I5032 Chronic diastolic (congestive) heart failure: Secondary | ICD-10-CM | POA: Diagnosis present

## 2022-10-14 DIAGNOSIS — Z9049 Acquired absence of other specified parts of digestive tract: Secondary | ICD-10-CM

## 2022-10-14 DIAGNOSIS — K5090 Crohn's disease, unspecified, without complications: Secondary | ICD-10-CM | POA: Diagnosis present

## 2022-10-14 DIAGNOSIS — E872 Acidosis, unspecified: Secondary | ICD-10-CM | POA: Diagnosis present

## 2022-10-14 DIAGNOSIS — D649 Anemia, unspecified: Secondary | ICD-10-CM | POA: Diagnosis present

## 2022-10-14 DIAGNOSIS — F419 Anxiety disorder, unspecified: Secondary | ICD-10-CM | POA: Diagnosis present

## 2022-10-14 DIAGNOSIS — Z885 Allergy status to narcotic agent status: Secondary | ICD-10-CM

## 2022-10-14 DIAGNOSIS — J449 Chronic obstructive pulmonary disease, unspecified: Secondary | ICD-10-CM | POA: Diagnosis present

## 2022-10-14 DIAGNOSIS — J8410 Pulmonary fibrosis, unspecified: Secondary | ICD-10-CM | POA: Diagnosis present

## 2022-10-14 DIAGNOSIS — Z880 Allergy status to penicillin: Secondary | ICD-10-CM

## 2022-10-14 DIAGNOSIS — N1831 Chronic kidney disease, stage 3a: Secondary | ICD-10-CM | POA: Diagnosis not present

## 2022-10-14 DIAGNOSIS — Z9981 Dependence on supplemental oxygen: Secondary | ICD-10-CM

## 2022-10-14 DIAGNOSIS — I10 Essential (primary) hypertension: Secondary | ICD-10-CM | POA: Diagnosis not present

## 2022-10-14 DIAGNOSIS — G43909 Migraine, unspecified, not intractable, without status migrainosus: Secondary | ICD-10-CM | POA: Diagnosis present

## 2022-10-14 DIAGNOSIS — M549 Dorsalgia, unspecified: Secondary | ICD-10-CM

## 2022-10-14 DIAGNOSIS — D638 Anemia in other chronic diseases classified elsewhere: Secondary | ICD-10-CM | POA: Diagnosis present

## 2022-10-14 DIAGNOSIS — I2510 Atherosclerotic heart disease of native coronary artery without angina pectoris: Secondary | ICD-10-CM | POA: Diagnosis present

## 2022-10-14 DIAGNOSIS — Z87891 Personal history of nicotine dependence: Secondary | ICD-10-CM

## 2022-10-14 DIAGNOSIS — R079 Chest pain, unspecified: Secondary | ICD-10-CM | POA: Diagnosis present

## 2022-10-14 DIAGNOSIS — Z1152 Encounter for screening for COVID-19: Secondary | ICD-10-CM

## 2022-10-14 DIAGNOSIS — I214 Non-ST elevation (NSTEMI) myocardial infarction: Secondary | ICD-10-CM | POA: Diagnosis present

## 2022-10-14 DIAGNOSIS — J9611 Chronic respiratory failure with hypoxia: Secondary | ICD-10-CM | POA: Diagnosis present

## 2022-10-14 HISTORY — DX: Atherosclerotic heart disease of native coronary artery without angina pectoris: I25.10

## 2022-10-14 LAB — CBC
HCT: 36.3 % (ref 36.0–46.0)
Hemoglobin: 11.8 g/dL — ABNORMAL LOW (ref 12.0–15.0)
MCH: 30.9 pg (ref 26.0–34.0)
MCHC: 32.5 g/dL (ref 30.0–36.0)
MCV: 95 fL (ref 80.0–100.0)
Platelets: 177 10*3/uL (ref 150–400)
RBC: 3.82 MIL/uL — ABNORMAL LOW (ref 3.87–5.11)
RDW: 13.1 % (ref 11.5–15.5)
WBC: 6.2 10*3/uL (ref 4.0–10.5)
nRBC: 0 % (ref 0.0–0.2)

## 2022-10-14 LAB — I-STAT CHEM 8, ED
BUN: 34 mg/dL — ABNORMAL HIGH (ref 8–23)
Calcium, Ion: 1 mmol/L — ABNORMAL LOW (ref 1.15–1.40)
Chloride: 98 mmol/L (ref 98–111)
Creatinine, Ser: 1.3 mg/dL — ABNORMAL HIGH (ref 0.44–1.00)
Glucose, Bld: 121 mg/dL — ABNORMAL HIGH (ref 70–99)
HCT: 38 % (ref 36.0–46.0)
Hemoglobin: 12.9 g/dL (ref 12.0–15.0)
Potassium: 4.4 mmol/L (ref 3.5–5.1)
Sodium: 135 mmol/L (ref 135–145)
TCO2: 29 mmol/L (ref 22–32)

## 2022-10-14 LAB — COMPREHENSIVE METABOLIC PANEL
ALT: 12 U/L (ref 0–44)
AST: 35 U/L (ref 15–41)
Albumin: 3.8 g/dL (ref 3.5–5.0)
Alkaline Phosphatase: 32 U/L — ABNORMAL LOW (ref 38–126)
Anion gap: 19 — ABNORMAL HIGH (ref 5–15)
BUN: 36 mg/dL — ABNORMAL HIGH (ref 8–23)
CO2: 21 mmol/L — ABNORMAL LOW (ref 22–32)
Calcium: 9 mg/dL (ref 8.9–10.3)
Chloride: 93 mmol/L — ABNORMAL LOW (ref 98–111)
Creatinine, Ser: 1.27 mg/dL — ABNORMAL HIGH (ref 0.44–1.00)
GFR, Estimated: 43 mL/min — ABNORMAL LOW (ref >60)
Glucose, Bld: 100 mg/dL — ABNORMAL HIGH (ref 70–99)
Potassium: 4.9 mmol/L (ref 3.5–5.1)
Sodium: 133 mmol/L — ABNORMAL LOW (ref 135–145)
Total Bilirubin: 0.8 mg/dL (ref 0.3–1.2)
Total Protein: 7 g/dL (ref 6.5–8.1)

## 2022-10-14 LAB — MAGNESIUM: Magnesium: 2.2 mg/dL (ref 1.7–2.4)

## 2022-10-14 LAB — SARS CORONAVIRUS 2 BY RT PCR: SARS Coronavirus 2 by RT PCR: NEGATIVE

## 2022-10-14 LAB — TROPONIN I (HIGH SENSITIVITY)
Troponin I (High Sensitivity): 196 ng/L (ref <18)
Troponin I (High Sensitivity): 228 ng/L (ref <18)

## 2022-10-14 LAB — BRAIN NATRIURETIC PEPTIDE: B Natriuretic Peptide: 321.6 pg/mL — ABNORMAL HIGH (ref 0.0–100.0)

## 2022-10-14 MED ORDER — CYCLOBENZAPRINE HCL 5 MG PO TABS
5.0000 mg | ORAL_TABLET | Freq: Three times a day (TID) | ORAL | Status: DC | PRN
  Administered 2022-10-14: 5 mg via ORAL
  Filled 2022-10-14: qty 1

## 2022-10-14 MED ORDER — CYCLOBENZAPRINE HCL 10 MG PO TABS
5.0000 mg | ORAL_TABLET | Freq: Once | ORAL | Status: AC
  Administered 2022-10-14: 5 mg via ORAL
  Filled 2022-10-14: qty 1

## 2022-10-14 MED ORDER — ENOXAPARIN SODIUM 40 MG/0.4ML IJ SOSY
40.0000 mg | PREFILLED_SYRINGE | INTRAMUSCULAR | Status: DC
  Administered 2022-10-14 – 2022-10-16 (×3): 40 mg via SUBCUTANEOUS
  Filled 2022-10-14 (×3): qty 0.4

## 2022-10-14 MED ORDER — FENOFIBRATE 160 MG PO TABS
160.0000 mg | ORAL_TABLET | Freq: Every day | ORAL | Status: DC
  Administered 2022-10-15 – 2022-10-17 (×3): 160 mg via ORAL
  Filled 2022-10-14 (×3): qty 1

## 2022-10-14 MED ORDER — LORAZEPAM 2 MG/ML IJ SOLN
0.5000 mg | Freq: Once | INTRAMUSCULAR | Status: AC
  Administered 2022-10-14: 0.5 mg via INTRAVENOUS
  Filled 2022-10-14: qty 1

## 2022-10-14 MED ORDER — EZETIMIBE 10 MG PO TABS
10.0000 mg | ORAL_TABLET | Freq: Every day | ORAL | Status: DC
  Administered 2022-10-14 – 2022-10-16 (×3): 10 mg via ORAL
  Filled 2022-10-14 (×3): qty 1

## 2022-10-14 MED ORDER — SODIUM CHLORIDE 0.9% FLUSH
3.0000 mL | Freq: Two times a day (BID) | INTRAVENOUS | Status: DC
  Administered 2022-10-14 – 2022-10-17 (×6): 3 mL via INTRAVENOUS

## 2022-10-14 MED ORDER — UMECLIDINIUM BROMIDE 62.5 MCG/ACT IN AEPB
1.0000 | INHALATION_SPRAY | Freq: Every day | RESPIRATORY_TRACT | Status: DC
  Administered 2022-10-15 – 2022-10-17 (×3): 1 via RESPIRATORY_TRACT
  Filled 2022-10-14: qty 7

## 2022-10-14 MED ORDER — AMIODARONE LOAD VIA INFUSION
150.0000 mg | Freq: Once | INTRAVENOUS | Status: DC
  Filled 2022-10-14: qty 83.34

## 2022-10-14 MED ORDER — AMIODARONE HCL IN DEXTROSE 360-4.14 MG/200ML-% IV SOLN: 30.0000 mg/h | INTRAVENOUS | Status: DC

## 2022-10-14 MED ORDER — TOBRAMYCIN 0.3 % OP SOLN
1.0000 [drp] | Freq: Three times a day (TID) | OPHTHALMIC | Status: DC
  Administered 2022-10-14 – 2022-10-15 (×5): 1 [drp] via OPHTHALMIC
  Filled 2022-10-14: qty 5

## 2022-10-14 MED ORDER — POLYETHYLENE GLYCOL 3350 17 G PO PACK: 17.0000 g | PACK | Freq: Every day | ORAL | Status: DC | PRN

## 2022-10-14 MED ORDER — ALPRAZOLAM 0.5 MG PO TABS
1.0000 mg | ORAL_TABLET | Freq: Three times a day (TID) | ORAL | Status: DC | PRN
  Administered 2022-10-14 – 2022-10-17 (×7): 1 mg via ORAL
  Filled 2022-10-14 (×5): qty 2
  Filled 2022-10-14: qty 4
  Filled 2022-10-14 (×2): qty 2

## 2022-10-14 MED ORDER — CARBAMAZEPINE ER 200 MG PO TB12
400.0000 mg | ORAL_TABLET | Freq: Two times a day (BID) | ORAL | Status: DC
  Administered 2022-10-14 – 2022-10-17 (×6): 400 mg via ORAL
  Filled 2022-10-14 (×7): qty 2

## 2022-10-14 MED ORDER — LEVALBUTEROL HCL 0.63 MG/3ML IN NEBU: 0.6300 mg | INHALATION_SOLUTION | Freq: Four times a day (QID) | RESPIRATORY_TRACT | Status: DC | PRN

## 2022-10-14 MED ORDER — ASPIRIN 81 MG PO CHEW
324.0000 mg | CHEWABLE_TABLET | Freq: Once | ORAL | Status: AC
  Administered 2022-10-14: 324 mg via ORAL
  Filled 2022-10-14: qty 4

## 2022-10-14 MED ORDER — SODIUM CHLORIDE 0.9 % IV BOLUS
500.0000 mL | Freq: Once | INTRAVENOUS | Status: AC
  Administered 2022-10-14: 500 mL via INTRAVENOUS

## 2022-10-14 MED ORDER — PREDNISOLONE ACETATE 1 % OP SUSP
1.0000 [drp] | Freq: Four times a day (QID) | OPHTHALMIC | Status: DC
  Administered 2022-10-14 – 2022-10-17 (×8): 1 [drp] via OPHTHALMIC
  Filled 2022-10-14: qty 5

## 2022-10-14 MED ORDER — FLUTICASONE FUROATE-VILANTEROL 200-25 MCG/ACT IN AEPB
1.0000 | INHALATION_SPRAY | Freq: Every day | RESPIRATORY_TRACT | Status: DC
  Administered 2022-10-16 – 2022-10-17 (×2): 1 via RESPIRATORY_TRACT
  Filled 2022-10-14 (×2): qty 28

## 2022-10-14 MED ORDER — AMIODARONE HCL IN DEXTROSE 360-4.14 MG/200ML-% IV SOLN: 60.0000 mg/h | INTRAVENOUS | Status: DC

## 2022-10-14 MED ORDER — PANTOPRAZOLE SODIUM 40 MG PO TBEC
40.0000 mg | DELAYED_RELEASE_TABLET | Freq: Every day | ORAL | Status: DC
  Administered 2022-10-15 – 2022-10-17 (×3): 40 mg via ORAL
  Filled 2022-10-14 (×3): qty 1

## 2022-10-14 MED ORDER — ACETAMINOPHEN 325 MG PO TABS
650.0000 mg | ORAL_TABLET | Freq: Four times a day (QID) | ORAL | Status: DC | PRN
  Administered 2022-10-14 – 2022-10-16 (×3): 650 mg via ORAL
  Filled 2022-10-14 (×3): qty 2

## 2022-10-14 MED ORDER — ACETAMINOPHEN 650 MG RE SUPP: 650.0000 mg | Freq: Four times a day (QID) | RECTAL | Status: DC | PRN

## 2022-10-14 NOTE — Plan of Care (Signed)

## 2022-10-14 NOTE — ED Triage Notes (Signed)
From home via ems for c/o chest pain x 2 hours that was relieved by nitro. Per ems patient took an extra dose of lasix last night b/c she thought she was in fluid overload. Patient alert and oriented. VSS.

## 2022-10-14 NOTE — Consult Note (Addendum)
Rounding Note    Patient Name: Brittany Irwin Date of Encounter: 10/14/2022  [place] Cardiologist: Noe Gens, DO   Subjective   82 yof with hx of HTN, CHF, CKD 3a/b, COPD and possible pulmonary fibrosis on 3-4L O2 at baseline, Crohn's Dz, anxiety on chronic xanax who presented via EMS for chest pain for 2 hours of substernal cramping chest tightness that awoke her from sleep. She took Nitro which relieved her symptoms. She did not get aspirin from EMS stating they upset her stomach. Not taking any antiplatelet or anticoagulants. Pain had resolved by evaluation in ED. Patient took additional dose of Metolazone last night because she felt volume overloaded and had been having some shortness of breath over past 2 days. States that her breathing improved with significant urination yesterday. Says her dry weight is around 150lbs and noted herself to be 154lbs yesterday and felt fluid in her abd.  She wears 3-4L O2 at home. She lives alone.  Her main complaint during evaluation in the ED is cramping in her legs.  She was noted to become tachycardic in the emergency department and quite anxious appearing. She takes Xanax chronically but did not take any this morning. Amiodarone was ordered and Ativan was given. Patient received Ativan prior to amiodarone with improvement in her HR to 90s, down from 120-130.   Troponins mildly elevated, but this appears stable compared to prior admissions where she was evaluated for atypical chest pain.  BNP mildly elevated, but lower compared to prior.  Initial EKG appears largely unchanged compared to previous. 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 Past ECHO with EF 60-65% and no concerning findings.  Cardiac Cath 2021 largely unremarkable.  She takes:  atenolol 12.5mg  Zetia 10mg , fenofibrate 200mg  Lasix 60mg  BID, metolazone 5mg  daily prn Losartan 25mg   Primary Cardiologist is Dr. Maudie Mercury.  Inpatient Medications    Scheduled Meds:    Continuous Infusions: none  PRN Meds:    Vital Signs    Vitals:   10/14/22 1200 10/14/22 1249 10/14/22 1330 10/14/22 1400  BP: 100/66  100/65 110/74  Pulse: (!) 103  (!) 106 (!) 105  Resp: 15  15 16   Temp:  98.3 F (36.8 C)    TempSrc:      SpO2: 94%  94% 95%  Weight:      Height:       No intake or output data in the 24 hours ending 10/14/22 1407    10/14/2022    8:36 AM 01/27/2022    8:30 PM 01/12/2022   12:00 PM  Last 3 Weights  Weight (lbs) 151 lb 153 lb 6.4 oz 154 lb 15.7 oz  Weight (kg) 68.493 kg 69.582 kg 70.3 kg      Telemetry    Remains tachycardic 100-120 with possible IVCD- Personally Reviewed  ECG   Initial EKG largely unchanged compared to prior EKG in June.  Possible rate related IVCD, does not appear consistent with STEMI - Personally Reviewed  Physical Exam   GEN: No acute distress.  Does appear uncomfortable. Neck: No JVD Cardiac: Tachycardic, no murmurs, rubs, or gallops.  Respiratory: Clear to auscultation bilaterally. GI: Soft, nontender, non-distended  MS: No edema; No deformity. Subjective cramping Neuro:  Nonfocal  Psych: Normal affect   Labs    High Sensitivity Troponin:   Recent Labs  Lab 10/14/22 1037  TROPONINIHS 196*     Chemistry Recent Labs  Lab 10/14/22 1100 10/14/22 1122  NA 133* 135  K  4.9 4.4  CL 93* 98  CO2 21*  --   GLUCOSE 100* 121*  BUN 36* 34*  CREATININE 1.27* 1.30*  CALCIUM 9.0  --   PROT 7.0  --   ALBUMIN 3.8  --   AST 35  --   ALT 12  --   ALKPHOS 32*  --   BILITOT 0.8  --   GFRNONAA 43*  --   ANIONGAP 19*  --     Lipids No results for input(s): "CHOL", "TRIG", "HDL", "LABVLDL", "LDLCALC", "CHOLHDL" in the last 168 hours.  Hematology Recent Labs  Lab 10/14/22 0850 10/14/22 1122  WBC 6.2  --   RBC 3.82*  --   HGB 11.8* 12.9  HCT 36.3 38.0  MCV 95.0  --   MCH 30.9  --   MCHC 32.5  --   RDW 13.1  --   PLT 177  --    Thyroid No results for input(s): "TSH", "FREET4" in the last 168  hours.  BNP Recent Labs  Lab 10/14/22 0900  BNP 321.6*    DDimer No results for input(s): "DDIMER" in the last 168 hours.   Radiology    DG Chest Port 1 View  Result Date: 10/14/2022 CLINICAL DATA:  Chest pain, cough. EXAM: PORTABLE CHEST 1 VIEW COMPARISON:  01/27/2022. FINDINGS: Low lung volumes accentuate the pulmonary vasculature and cardiomediastinal silhouette. Unchanged scarring at the right-greater-than-left lung bases with associated volume loss. No new airspace opacity. Stable cardiac and mediastinal contours. No pneumothorax or pleural effusion. IMPRESSION: Unchanged scarring at the right-greater-than-left lung bases with associated volume loss. No new airspace opacity. Electronically Signed   By: Emmit Alexanders M.D.   On: 10/14/2022 09:15    Cardiac Studies   ECHO: IMPRESSIONS   1. Left ventricular ejection fraction, by estimation, is 60 to 65%. The  left ventricle has normal function. The left ventricle has no regional  wall motion abnormalities. There is moderate concentric left ventricular  hypertrophy. Left ventricular  diastolic parameters are consistent with Grade I diastolic dysfunction  (impaired relaxation). Elevated left ventricular end-diastolic pressure.   2. Right ventricular systolic function is normal. The right ventricular  size is normal. There is mildly elevated pulmonary artery systolic  pressure.   3. Left atrial size was mildly dilated.   4. A small pericardial effusion is present. There is no evidence of  cardiac tamponade.   5. The mitral valve is normal in structure. Trivial mitral valve  regurgitation. No evidence of mitral stenosis. Moderate mitral annular  calcification.   6. The aortic valve is tricuspid. Aortic valve regurgitation is trivial.  No aortic stenosis is present.   7. Aortic dilatation noted. There is borderline dilatation of the  ascending aorta, measuring 36 mm.   8. The inferior vena cava is dilated in size with <50%  respiratory  variability, suggesting right atrial pressure of 15 mmHg.   FINDINGS   Left Ventricle: Left ventricular ejection fraction, by estimation, is 60  to 65%. The left ventricle has normal function. The left ventricle has no  regional wall motion abnormalities. The left ventricular internal cavity  size was normal in size. There is   moderate concentric left ventricular hypertrophy. Left ventricular  diastolic parameters are consistent with Grade I diastolic dysfunction  (impaired relaxation). Elevated left ventricular end-diastolic pressure.   Right Ventricle: The right ventricular size is normal. No increase in  right ventricular wall thickness. Right ventricular systolic function is  normal. There is mildly elevated pulmonary  artery systolic pressure. The  tricuspid regurgitant velocity is 2.68   m/s, and with an assumed right atrial pressure of 15 mmHg, the estimated  right ventricular systolic pressure is [id number] mmHg.   Left Atrium: Left atrial size was mildly dilated.   Right Atrium: Right atrial size was normal in size.   Pericardium: A small pericardial effusion is present. There is no evidence  of cardiac tamponade.   Mitral Valve: The mitral valve is normal in structure. Moderate mitral  annular calcification. Trivial mitral valve regurgitation. No evidence of  mitral valve stenosis.   Tricuspid Valve: The tricuspid valve is normal in structure. Tricuspid  valve regurgitation is trivial. No evidence of tricuspid stenosis.   Aortic Valve: The aortic valve is tricuspid. Aortic valve regurgitation is  trivial. No aortic stenosis is present. Aortic valve mean gradient  measures 3.0 mmHg. Aortic valve peak gradient measures 5.6 mmHg. Aortic  valve area, by VTI measures 2.66 cm.   Pulmonic Valve: The pulmonic valve was normal in structure. Pulmonic valve  regurgitation is trivial. No evidence of pulmonic stenosis.   Aorta: Aortic dilatation noted. There is  borderline dilatation of the  ascending aorta, measuring 36 mm.   Venous: The inferior vena cava is dilated in size with less than 50%  respiratory variability, suggesting right atrial pressure of 15 mmHg.   IAS/Shunts: No atrial level shunt detected by color flow Doppler.     LEFT VENTRICLE  PLAX 2D  LVIDd:         4.20 cm     Diastology  LVIDs:         2.90 cm     LV e' medial:    3.15 cm/s  LV PW:         1.56 cm     LV E/e' medial:  37.0  LV IVS:        1.69 cm     LV e' lateral:   3.37 cm/s  LVOT diam:     2.10 cm     LV E/e' lateral: 34.6  LV SV:         87  LV SV Index:   49  LVOT Area:     3.46 cm    LV Volumes (MOD)  LV vol d, MOD A2C: 40.4 ml  LV vol d, MOD A4C: 44.6 ml  LV vol s, MOD A2C: 22.1 ml  LV vol s, MOD A4C: 14.9 ml  LV SV MOD A2C:     18.3 ml  LV SV MOD A4C:     44.6 ml  LV SV MOD BP:      25.6 ml   RIGHT VENTRICLE             IVC  RV Basal diam:  3.20 cm     IVC diam: 2.40 cm  RV Mid diam:    1.40 cm  RV S prime:     11.80 cm/s  TAPSE (M-mode): 2.1 cm   LEFT ATRIUM             Index        RIGHT ATRIUM           Index  LA diam:        4.90 cm 2.77 cm/m   RA Area:     12.50 cm  LA Vol (A2C):   50.5 ml 28.53 ml/m  RA Volume:   25.60 ml  14.46 ml/m  LA Vol (A4C):   47.8 ml 27.00  ml/m  LA Biplane Vol: 53.7 ml 30.34 ml/m   AORTIC VALVE                    PULMONIC VALVE  AV Area (Vmax):    3.20 cm     PV Vmax:          1.23 m/s  AV Area (Vmean):   3.46 cm     PV Peak grad:     6.1 mmHg  AV Area (VTI):     2.66 cm     PR End Diast Vel: 7.40 msec  AV Vmax:           118.00 cm/s  AV Vmean:          88.100 cm/s  AV VTI:            0.328 m  AV Peak Grad:      5.6 mmHg  AV Mean Grad:      3.0 mmHg  LVOT Vmax:         109.00 cm/s  LVOT Vmean:        87.900 cm/s  LVOT VTI:          0.252 m  LVOT/AV VTI ratio: 0.77    AORTA  Ao Root diam: 3.50 cm  Ao Asc diam:  3.60 cm   MITRAL VALVE                TRICUSPID VALVE  MV Area (PHT): 5.46 cm      TR Peak grad:   28.7 mmHg  MV Decel Time: 139 msec     TR Vmax:        268.00 cm/s  MR Peak grad: 111.9 mmHg  MR Vmax:      529.00 cm/s   SHUNTS  MV E velocity: 116.50 cm/s  Systemic VTI:  0.25 m  MV A velocity: 109.00 cm/s  Systemic Diam: 2.10 cm  MV E/A ratio:  1.07    Cardiac Cath 2021: FINDINGS:  Coronary Angiography  1. Left Main -no significant stenosis  2. Left anterior descending artery -mild, less than 25% gnosis and mid  vessel  3. Diagonals -large diagonal 1 with no significant stenosis  4. Left Circumflex -mild irregularities  5. Obtuse Marginals -no significant stenosis  6. Right Coronary Artery -mild irregularities  7. Posterior Descending Artery -no significant stenosis  RECOMMENDATIONS:  -Medical therapy for heart failure preserved ejection fraction.   NM Heart Spect Ph Stress: Impression:  1) SPECT Tc52m Cardiolite stress myocardial perfusion imaging is normal. There is no scintigraphic evidence of regional myocardial ischemia or scar.  2) TID 1.24 (visually there is no TID)  3) There was normal wall thickening post stress.  Patient Profile     82 y.o. female with a hx of HTN, HFpEF, CKD 3a/b, and COPD and possible pulmonary fibrosis presents, Crohn's Dz, and anxiety on chronic Xanax with complaints of substernal chest tightness and leg cramps. Has had multiple evaluations for chest pain and palpitatons in the past with stable troponemia, persistently elevated BNP, and reassuring EKG's, ECHO, cardiac cath, and stress test. Was noted to have some tachycardia with possible IVCD here.   Assessment & Plan    # Atypical Chest Pain Do not believe patient's chest pain is cardiac in etiology. Her troponin is mildly elevated, but flat compared to prior values. She has an elevated BNP, but this too is stable compared to prior. Initial EKG largely unchanged compared to EKG obtained in June.  Her lat ECHO without any concrning findings and ony Grade 1 DD. She has had Cardiac  Cath in 2021 with only mild disease and a stress test without signs of ischemia.  Pain has resolved. Anxiety needs to be assessed as potential etiology for her presentation .  - no indication for urgent/emergent cardiac cath. -  f/u 2nd troponin. - no heparin - start on DVT prophylaxis.   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 This was in the setting of pain. She received flexeril and Ativan with reduction in rate to 90s. This improvement was gradual and did not suggest conversion. Did not receive Amiodarone. This will likely resolve with improvement in pain and consequently heart rate.  - would continue to monitor on telemetry and f/u repeat troponin - repeat ECHO - K >4, Mg >2 - cardiology will follow along  # HFpEF No signs of volume overload, BNP stable. Weight from recent OV note is 68.9kg with recorded weight here at 68.5kg. She does have mild elevation in her creatinine which may be due to dehydration given her reported history. Extremities are warm and well perfused with good pulses.  - Repeat ECHO - resume lasix at home dose    # Dyspnea # COPD on 3-4L O2 baseline Patient has multiple reasons to be dyspneic. CHF exacerbation is certainly possible, but she does not seem to be volume overloaded at this time. She is currently satting well on her home 3L O2.   # Leg Cramps Patient asking for Dilaudid. Pain improved with massage by sister at bedside. Has some hypocalcemia, but potassium normal. Would check Magnesium - Management per primary.   Crohn's Dz, anxiety - per primary  For questions or updates, please contact [place] Please consult [email or web address] for contact info under     Signed, Delene Ruffini, MD  10/14/2022, 2:07 PM    I agree with assessment and plan by Dr. Elliot Gurney  Patient came to the ER with atypical chest pain and leg cramps.  She has a history of diastolic heart failure on oral diuretics  (furosemide 60 mg p.o. twice daily and as needed Zaroxolyn).  She also has pulmonary fibrosis on home O2.  She does have a history of anxiety and Crohn's disease.  She has normal LV function by her most recent 2D echo and cardiac catheterization performed in 2021 showed no evidence of CAD.  She is chronically elevated troponins.  BNP is actually lower than it has been in the past.  She did take an extra Zaroxolyn yesterday because of abdominal bloating and slight weight gain.  She weighs herself on a daily basis.  She gets leg cramps when she diuresis.  Her exam is benign.  Her initial EKG showed no acute changes and subsequent EKG did show a wide-complex tachycardia which I suspect is a rate related IVCD.  Would admit to the medicine service/tried hospitalist.  Repeated 2D echo.  I do not suspect that her chest pain is ischemically mediated.  Follow-up EKG.  Will continue to follow.   Lorretta Harp, M.D., [place], [facility] [place], [place] [address] [place], [place]  [address]  [phone] 10/14/2022 3:12 PM

## 2022-10-14 NOTE — ED Notes (Signed)
Printed EKG strip for cardiologist to review. MD acknowledged ST elevations and states another Cardiologist is coming.

## 2022-10-14 NOTE — ED Provider Notes (Signed)
[facility] Provider Note   CSN: [id number] Arrival date & time: 10/14/22  [id number]     History  Chief Complaint  Patient presents with   Chest Pain    Brittany Irwin is a 82 y.o. female.  HPI 82 year old female history of CHF, hypertension, CKD stage III, chronic anemia, Crohn's disease, back pain, presents today complaining of chest pain.  She states that she had some substernal chest pain this morning when she awoke to leg cramps.  Pain was cramping in nature.  It was nonradiating.  She took a nitroglycerin with resolution of pain.  She did not have pain on EMS arrival and did not receive further medication.  She is currently not having any chest pain.  He states that she has had some dyspnea and lightheadedness over the past 2 days.  Prior to that she did have several days where she had some cough congestion and fever.  She denies any history of DVT, PE, and is not on blood thinners.  She was not given aspirin as she said that they upset her stomach.  She is on home oxygen and states that she has been using it 3 to 4 L.  She was transported here on oxygen.     Home Medications Prior to Admission medications   Medication Sig Start Date End Date Taking? Authorizing Provider  acetaminophen-codeine (TYLENOL #4) 300-60 MG tablet Take 1 tablet by mouth 4 (four) times daily as needed for pain. 12/31/21  Yes [provider]  Aloe-Sodium Chloride (AYR SALINE NASAL GEL NA) Place 1 Application into the nose 2 (two) times daily as needed (Dry nose).   Yes [provider]  ALPRAZolam Duanne Moron) 1 MG tablet Take 1 mg by mouth 3 (three) times daily as needed for anxiety. 01/02/22  Yes [provider]  atenolol (TENORMIN) 25 MG tablet Take 12.5-25 mg by mouth See admin instructions. 12.5 mg if heart rate < 60 25 mg if heart rate > 60 01/02/22  Yes [provider]  carbamazepine (CARBATROL) 200 MG 12 hr capsule Take 400 mg by  mouth 2 (two) times daily. 12/19/21  Yes [provider]  cyanocobalamin (,VITAMIN B-12,) 1000 MCG/ML injection Inject 1,000 mcg into the muscle every 30 (thirty) days.   Yes [provider]  cyclobenzaprine (FLEXERIL) 5 MG tablet Take 5 mg by mouth 3 (three) times daily as needed for muscle spasms. 10/13/22  Yes [provider]  diclofenac Sodium (VOLTAREN ARTHRITIS PAIN) 1 % GEL Apply 1 Application topically 2 (two) times daily as needed (back pain).   Yes [provider]  ergocalciferol (VITAMIN D2) 1.25 MG (50000 UT) capsule Take 50,000 Units by mouth every Friday.   Yes [provider]  ezetimibe (ZETIA) 10 MG tablet Take 10 mg by mouth at bedtime. 12/31/21  Yes [provider]  fenofibrate micronized (LOFIBRA) 200 MG capsule Take 200 mg by mouth daily. 12/31/21  Yes [provider]  furosemide (LASIX) 20 MG tablet Take 60 mg by mouth in the morning and at bedtime. 12/31/21  Yes [provider]  gabapentin (NEURONTIN) 100 MG capsule Take 100 mg by mouth daily. 12/31/21  Yes [provider]  levalbuterol (XOPENEX HFA) 45 MCG/ACT inhaler Inhale 2 puffs into the lungs every 6 (six) hours as needed for shortness of breath or wheezing. 12/23/21  Yes [provider]  losartan (COZAAR) 25 MG tablet Take 1 tablet (25 mg total) by mouth daily. 01/05/22  Yes Arrien, Jimmy Picket, MD  Menthol, Topical Analgesic, (ICY HOT BACK EX) Apply 1 Application topically 2 (two) times daily as needed (back pain).   Yes [provider]  metolazone (ZAROXOLYN) 5 MG tablet Take 1 tablet (5 mg total) by mouth daily as needed (edema). Please take as needed in case of weight gain 3 lbs in 24 to 48 hrs or 5 lbs in 7 days. 01/04/22  Yes Arrien, Jimmy Picket, MD  nitroGLYCERIN (NITROSTAT) 0.4 MG SL tablet Place 0.4 mg under the tongue every 5 (five) minutes x 3 doses as needed for chest pain. 12/31/21  Yes [provider]   pantoprazole (PROTONIX) 40 MG tablet Take 40 mg by mouth daily. 12/31/21  Yes [provider]  potassium chloride SA (KLOR-CON M) 20 MEQ tablet Take 20 mEq by mouth daily. 12/31/21  Yes [provider]  prednisoLONE acetate (PRED FORTE) 1 % ophthalmic suspension Place 1 drop into the left eye 4 (four) times daily. 02/26/22  Yes [provider]  tobramycin (TOBREX) 0.3 % ophthalmic solution Place 1 drop into the left eye in the morning, at noon, in the evening, and at bedtime. 02/26/22  Yes [provider]  Donnal Debar 200-62.5-25 MCG/ACT AEPB Inhale 1 puff into the lungs daily. 12/31/21  Yes [provider]      Allergies    Azulfidine [sulfasalazine], Epipen [epinephrine], Flagyl [metronidazole], Motrin [ibuprofen], Nsaids, Penicillins, and Morphine and related    Review of Systems   Review of Systems  Physical Exam Updated Vital Signs BP 110/74   Pulse (!) 105   Temp 98.3 F (36.8 C)   Resp 16   Ht 1.626 m (5\' 4" )   Wt 68.5 kg   SpO2 95%   BMI 25.92 kg/m  Physical Exam Vitals reviewed.  Constitutional:      Appearance: She is well-developed.  HENT:     Head: Normocephalic.  Eyes:     Pupils: Pupils are equal, round, and reactive to light.  Cardiovascular:     Rate and Rhythm: Normal rate and regular rhythm.     Heart sounds: Normal heart sounds.  Pulmonary:     Effort: Pulmonary effort is normal.     Breath sounds: Normal breath sounds.  Abdominal:     General: Bowel sounds are normal.     Palpations: Abdomen is soft.  Musculoskeletal:     Cervical back: Normal range of motion.     Right lower leg: No tenderness. No edema.     Left lower leg: No tenderness. No edema.  Skin:    General: Skin is warm and dry.  Neurological:     General: No focal deficit present.     Mental Status: She is alert.  Psychiatric:        Mood and Affect: Mood normal.     ED Results / Procedures / Treatments   Labs (all labs ordered are  listed, but only abnormal results are displayed) Labs Reviewed  CBC - Abnormal; Notable for the following components:      Result Value   RBC 3.82 (*)    Hemoglobin 11.8 (*)    All other components within normal limits  BRAIN NATRIURETIC PEPTIDE - Abnormal; Notable for the following components:   B Natriuretic Peptide 321.6 (*)    All other components within normal limits  COMPREHENSIVE METABOLIC PANEL - Abnormal; Notable for the following components:   Sodium 133 (*)    Chloride 93 (*)    CO2  21 (*)    Glucose, Bld 100 (*)    BUN 36 (*)    Creatinine, Ser 1.27 (*)    Alkaline Phosphatase 32 (*)    GFR, Estimated 43 (*)    Anion gap 19 (*)    All other components within normal limits  I-STAT CHEM 8, ED - Abnormal; Notable for the following components:   BUN 34 (*)    Creatinine, Ser 1.30 (*)    Glucose, Bld 121 (*)    Calcium, Ion 1.00 (*)    All other components within normal limits  TROPONIN I (HIGH SENSITIVITY) - Abnormal; Notable for the following components:   Troponin I (High Sensitivity) 196 (*)    All other components within normal limits  SARS CORONAVIRUS 2 BY RT PCR  MAGNESIUM  TROPONIN I (HIGH SENSITIVITY)    EKG EKG Interpretation  Date/Time:  Wednesday October 14 2022 11:03:35 EDT Ventricular Rate:  122 PR Interval:  240 QRS Duration: 252 QT Interval:  414 QTC Calculation: 589 R Axis:   203 Text Interpretation: Suspect arm lead reversal, interpretation assumes no reversal Wide QRS tachycardia Right bundle branch block , plus right ventricular hypertrophy Septal infarct , age undetermined Lateral infarct , age undetermined Abnormal ECG When compared with ECG of 14-Oct-2022 08:45, PREVIOUS ECG IS PRESENT Confirmed by Pattricia Boss [phone]) on 10/14/2022 11:06:24 AM  Radiology DG Chest Port 1 View  Result Date: 10/14/2022 CLINICAL DATA:  Chest pain, cough. EXAM: PORTABLE CHEST 1 VIEW COMPARISON:  01/27/2022. FINDINGS: Low lung volumes accentuate the pulmonary  vasculature and cardiomediastinal silhouette. Unchanged scarring at the right-greater-than-left lung bases with associated volume loss. No new airspace opacity. Stable cardiac and mediastinal contours. No pneumothorax or pleural effusion. IMPRESSION: Unchanged scarring at the right-greater-than-left lung bases with associated volume loss. No new airspace opacity. Electronically Signed   By: Emmit Alexanders M.D.   On: 10/14/2022 09:15    Procedures .Critical Care  Performed by: Pattricia Boss, MD Authorized by: Pattricia Boss, MD   Critical care provider statement:    Critical care time (minutes):  76   Critical care was necessary to treat or prevent imminent or life-threatening deterioration of the following conditions:  Circulatory failure and cardiac failure   Critical care was time spent personally by me on the following activities:  Development of treatment plan with patient or surrogate, discussions with consultants, evaluation of patient's response to treatment, examination of patient, ordering and review of laboratory studies, ordering and review of radiographic studies, ordering and performing treatments and interventions, pulse oximetry, re-evaluation of patient's condition and review of old charts     Medications Ordered in ED Medications  fenofibrate tablet 160 mg (has no administration in time range)  ezetimibe (ZETIA) tablet 10 mg (has no administration in time range)  ALPRAZolam (XANAX) tablet 1 mg (has no administration in time range)  pantoprazole (PROTONIX) EC tablet 40 mg (has no administration in time range)  carbamazepine (CARBATROL) 12 hr capsule 400 mg (has no administration in time range)  fluticasone furoate-vilanterol (BREO ELLIPTA) 200-25 MCG/ACT 1 puff (has no administration in time range)    And  umeclidinium bromide (INCRUSE ELLIPTA) 62.5 MCG/ACT 1 puff (has no administration in time range)  levalbuterol (XOPENEX) nebulizer solution 0.63 mg (has no administration in  time range)  prednisoLONE acetate (PRED FORTE) 1 % ophthalmic suspension 1 drop (has no administration in time range)  tobramycin (TOBREX) 0.3 % ophthalmic solution 1 drop (has no administration in time range)  enoxaparin (LOVENOX)  injection 40 mg (has no administration in time range)  sodium chloride flush (NS) 0.9 % injection 3 mL (has no administration in time range)  acetaminophen (TYLENOL) tablet 650 mg (has no administration in time range)    Or  acetaminophen (TYLENOL) suppository 650 mg (has no administration in time range)  polyethylene glycol (MIRALAX / GLYCOLAX) packet 17 g (has no administration in time range)  LORazepam (ATIVAN) injection 0.5 mg (0.5 mg Intravenous Given 10/14/22 1106)  sodium chloride 0.9 % bolus 500 mL (500 mLs Intravenous New Bag/Given 10/14/22 1148)  cyclobenzaprine (FLEXERIL) tablet 5 mg (5 mg Oral Given 10/14/22 1149)  aspirin chewable tablet 324 mg (324 mg Oral Given 10/14/22 1309)    ED Course/ Medical Decision Making/ A&P Clinical Course as of 10/14/22 1423  Wed Oct 14, 2022  0930 Chest x-Deztinee Lohmeyer reviewed and interpreted with probable rotation noted on my interpretation with no acute abnormalities radiologist interpretation concurs [DR]  1015 CBC reviewed and interpreted significant for anemia with hemoglobin 11.8 which is stable from first prior [DR]  1039 BNP is elevated at 321 however is decreased from first prior and several prior to this. [DR]  [id number] I-STAT ordered to review electrolytes due to delay and complete metabolic panel BUN is slightly elevated at 34 and creatinine 1.3 Sodium normal at 135 Potassium normal at 4.4 Ionized calcium is slightly low at 1.0 [DR]  1258 Troponin reported as elevated at 196 [DR]    Clinical Course User Index [DR] Pattricia Boss, MD                             Medical Decision Making Amount and/or Complexity of Data Reviewed Labs: ordered. Radiology: ordered.  Risk OTC drugs. Prescription drug  management. Decision regarding hospitalization.   This patient presents to the ED for concern of chest pain and leg cramping this involves an extensive number of treatment options, and is a complaint that carries with it a high risk of complications and morbidity.  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 moderate LVH without obstruction Weight 149 at home   Assessment and Plan   1. 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 Hypertrophic cardiomyopathy/small ventricle- No LVOT gradient/obstruction    Plan: Cardiac standpoint the patient seems to be doing well. She does not describe any angina or anginal-like symptoms. I think the heartburn that she had the other evening was actually heartburn. No signs of decompensated heart failure. Fortunately she has not had any recent hospitalizations. No medication changes today. I will plan on seeing her back in 3 months  No orders of the defined types were placed in this encounter.  Risks, benefits, and alternatives of the medications and treatment plan prescribed today were discussed, and patient expressed understanding. Plan follow-up as discussed or as needed if any worsening symptoms or change in condition.   Health Maintenance issues including  appropriate  healthy diet, exercise and smoking avoidance were discussed with pt.    Lab Tests:  I Ordered, and personally interpreted labs.  The pertinent results include: Electrolytes within normal limits with no hypo or hyperkalemia, BNP 321 which is elevated but stable from prior CBC with noted anemia hemoglobin 11 which is stable from prior Elevated troponin at 196   Imaging Studies ordered:  I ordered imaging studies including chest x-Yasha Tibbett I independently visualized and interpreted imaging which showed no acute abnormality I agree with the radiologist interpretation   Cardiac Monitoring: / EKG:  The patient was maintained on a cardiac monitor.  I personally viewed and interpreted the cardiac monitored which showed an underlying rhythm of: Patient noted to be in wide-complex tachycardia rate about 150 on monitor She remained hemodynamically stable Repeat EKG obtained This EKG is reviewed and amiodarone ordered Patient received Ativan that had been previously ordered and heart rate decreased back into 90s with maintained wide complex Cardiology is consulted-after elevated troponin, recontacted cardiology, Wannetta Sender states that Dr. Gwenlyn Found should be in department and resident is at bedside.   Consultations Obtained:  I requested consultation with the cardiology,  and discussed lab and imaging findings as well as pertinent plan - they recommend: Admission Discussed with Dr. Alvester Chou, on-call for cardiology and he feels that patient can be admitted to medicine service Discussed care with Dr. Trilby Drummer, on-call for hospitalist and he will see admit patient for admission 2:23 PM Alerted that patient appears to be in different rhythm.  I went to room and checked monitor.  She has a wide complex rate of 125 EKG being obtained at this time. Problem List / ED Course / Critical interventions / Medication management   I ordered medication including Ativan and amiodarone for patient Xanax at baseline has not  had her Xanax today and amiodarone for wide-complex tachycardia Reevaluation of the patient after these medicines showed that the patient improved-patient's heart rate has decreased to the 90s and patient feels improved I have reviewed the patients home medicines and have made adjustments as needed   Social Determinants of Health:  Patient elderly at 34   Test / Admission - Considered:  Chest pain considered acute coronary syndrome and NSTEMI.  Cardiology was consulted please see their full note.  Patient has chronically elevated troponins.  These have been noted over the past several admissions.  Patient has had several catheterizations and echocardiograms with minimal coronary artery disease.  Discussed heparinization and not felt to be needed at this time.  Patient has remained pain-free in her chest while here in the ED 2 patient with arrhythmia noted.  Appears on on the monitor and is wider complex than her baseline.  Second EKG was obtained and to be wide-complex tachycardia with heart rate about 125.  Patient had not had her a.m. Xanax.  She was given Ativan IV.  Amiodarone was considered and ordered.  However, patient received the Ativan first.  At this time, when she received the Ativan, her heart rate decreased from the 120s into the 90s.  There was no sudden change in rate.  There was some narrowing of her QRS.  Suspect that she has some intraventricular conduction delay that is rate related.  However, could not rule out that there was a ventricular tachycardia. 3 patient with muscle cramps in her legs.  She has good pulses in place.  She does not have any acute electrolyte abnormalities.  She is treated here with Ativan and with Flexeril.  Final Clinical Impression(s) / ED Diagnoses Final diagnoses:  Chest pain, unspecified type  Elevated troponin  Wide-complex tachycardia    Rx / DC Orders ED Discharge Orders     None         Pattricia Boss, MD 10/14/22  1424

## 2022-10-14 NOTE — ED Notes (Signed)
Per Cardiologist at bedside, they are not taking pt to cath lab at this time.

## 2022-10-14 NOTE — ED Notes (Signed)
ED TO INPATIENT HANDOFF REPORT  ED Nurse Name and Phone #: Lynn Ito RN  S Name/Age/Gender Brittany Irwin 82 y.o. female Room/Bed: 008C/008C  Code Status   Code Status: Full Code  Home/SNF/Other Home Patient oriented to: self, place, time, and situation Is this baseline? Yes   Triage Complete: Triage complete  Chief Complaint Chest pain, rule out acute myocardial infarction [R07.9]  Triage Note From home via ems for c/o chest pain x 2 hours that was relieved by nitro. Per ems patient took an extra dose of lasix last night b/c she thought she was in fluid overload. Patient alert and oriented. VSS.    Allergies Allergies  Allergen Reactions   Azulfidine [Sulfasalazine] Other (See Comments)    Headache    Epipen [Epinephrine] Hypertension   Flagyl [Metronidazole] Hives   Motrin [Ibuprofen] Nausea Only and Other (See Comments)    Told not to take medication due to GI distress caused by crohn's disease.   Nsaids Other (See Comments)    Told not to take NSAIDs due to GI distress caused by crohn's disease   Penicillins Hives   Morphine And Related Other (See Comments)    Told not to take due to GI distress caused by crohn's disease    Level of Care/Admitting Diagnosis ED Disposition     ED Disposition  Admit   Condition  --   [place]: [place] [[id number]]  Level of Care: Telemetry Cardiac [103]  [facility] or [place] if equivalent level of care is available:: No  Covid Evaluation: Asymptomatic - no recent exposure (last 10 days) testing not required  Diagnosis: Chest pain, rule out acute myocardial infarction [id number]  Admitting Physician: Marcelyn Bruins [id number]  Attending Physician: Marcelyn Bruins [id number]          B Medical/Surgery History Past Medical History:  Diagnosis Date   Acute CHF (congestive heart failure) ([place]) 01/02/2022   AKI (acute kidney injury) ([place])  09/08/2020   Aspiration pneumonia ([place]) 01/05/2019   Atypical chest pain 03/11/2022   Bradycardia 03/14/2020   C. difficile colitis 04/03/2021   CHF (congestive heart failure) ([place])    Coronary artery disease    Tachycardia 05/31/2013   Past Surgical History:  Procedure Laterality Date   ABDOMINAL HYSTERECTOMY     BOWEL RESECTION     CHOLECYSTECTOMY       A IV Location/Drains/Wounds Patient Lines/Drains/Airways Status     Active Line/Drains/Airways     Name Placement date Placement time Site Days   Peripheral IV 10/14/22 22 G 1" Right Antecubital 10/14/22  0936  Antecubital  less than 1            Intake/Output Last 24 hours  Intake/Output Summary (Last 24 hours) at 10/14/2022 1614 Last data filed at 10/14/2022 1300 Gross per 24 hour  Intake 500 ml  Output --  Net 500 ml    Labs/Imaging Results for orders placed or performed during the hospital encounter of 10/14/22 (from the past 48 hour(s))  SARS Coronavirus 2 by RT PCR (hospital order, performed in [facility] hospital lab) *cepheid single result test* Anterior Nasal Swab     Status: None   Collection Time: 10/14/22  8:37 AM   Specimen: Anterior Nasal Swab  Result Value Ref Range   SARS Coronavirus 2 by RT PCR NEGATIVE NEGATIVE    Comment: Performed at [facility], [address] [place], [place] [address]  CBC     Status: Abnormal   Collection Time: 10/14/22  8:50 AM  Result Value Ref Range   WBC 6.2 4.0 - 10.5 K/uL   RBC 3.82 (L) 3.87 - 5.11 MIL/uL   Hemoglobin 11.8 (L) 12.0 - 15.0 g/dL   HCT 36.3 36.0 - 46.0 %   MCV 95.0 80.0 - 100.0 fL   MCH 30.9 26.0 - 34.0 pg   MCHC 32.5 30.0 - 36.0 g/dL   RDW 13.1 11.5 - 15.5 %   Platelets 177 150 - 400 K/uL   nRBC 0.0 0.0 - 0.2 %    Comment: Performed at [facility], [place] [address] [place], [place] [address]  Brain natriuretic peptide     Status: Abnormal   Collection Time: 10/14/22  9:00 AM  Result Value Ref Range   B Natriuretic Peptide  321.6 (H) 0.0 - 100.0 pg/mL    Comment: Performed at [facility] [address], [place], [place] [address]  Troponin I (High Sensitivity)     Status: Abnormal   Collection Time: 10/14/22 10:37 AM  Result Value Ref Range   Troponin I (High Sensitivity) 196 (HH) <18 ng/L    Comment: CRITICAL RESULT CALLED TO, READ BACK BY AND VERIFIED WITH M. Rorey Bisson, RN AT 1223 03.27.24 D. BLU (NOTE) Elevated high sensitivity troponin I (hsTnI) values and significant  changes across serial measurements may suggest ACS but many other  chronic and acute conditions are known to elevate hsTnI results.  Refer to the "Links" section for chest pain algorithms and additional  guidance. Performed at [facility], [place] [address] [place], [place] [address]   Comprehensive metabolic panel     Status: Abnormal   Collection Time: 10/14/22 11:00 AM  Result Value Ref Range   Sodium 133 (L) 135 - 145 mmol/L   Potassium 4.9 3.5 - 5.1 mmol/L    Comment: HEMOLYSIS AT THIS LEVEL MAY AFFECT RESULT   Chloride 93 (L) 98 - 111 mmol/L   CO2 21 (L) 22 - 32 mmol/L   Glucose, Bld 100 (H) 70 - 99 mg/dL    Comment: Glucose reference range applies only to samples taken after fasting for at least 8 hours.   BUN 36 (H) 8 - 23 mg/dL   Creatinine, Ser 1.27 (H) 0.44 - 1.00 mg/dL   Calcium 9.0 8.9 - 10.3 mg/dL   Total Protein 7.0 6.5 - 8.1 g/dL    Comment: HEMOLYSIS AT THIS LEVEL MAY AFFECT RESULT   Albumin 3.8 3.5 - 5.0 g/dL   AST 35 15 - 41 U/L   ALT 12 0 - 44 U/L   Alkaline Phosphatase 32 (L) 38 - 126 U/L   Total Bilirubin 0.8 0.3 - 1.2 mg/dL   GFR, Estimated 43 (L) >60 mL/min    Comment: (NOTE) Calculated using the CKD-EPI Creatinine Equation (2021)    Anion gap 19 (H) 5 - 15    Comment: Performed at [facility] [address], [place],  [address]  I-stat chem 8, ED (not at [facility], [facility] or [facility])     Status: Abnormal   Collection Time: 10/14/22 11:22 AM  Result Value Ref Range   Sodium 135 135 - 145  mmol/L   Potassium 4.4 3.5 - 5.1 mmol/L   Chloride 98 98 - 111 mmol/L   BUN 34 (H) 8 - 23 mg/dL   Creatinine, Ser 1.30 (H) 0.44 - 1.00 mg/dL   Glucose, Bld 121 (H) 70 -  99 mg/dL    Comment: Glucose reference range applies only to samples taken after fasting for at least 8 hours.   Calcium, Ion 1.00 (L) 1.15 - 1.40 mmol/L   TCO2 29 22 - 32 mmol/L   Hemoglobin 12.9 12.0 - 15.0 g/dL   HCT 38.0 36.0 - 46.0 %  Troponin I (High Sensitivity)     Status: Abnormal   Collection Time: 10/14/22  1:00 PM  Result Value Ref Range   Troponin I (High Sensitivity) 228 (HH) <18 ng/L    Comment: CRITICAL VALUE NOTED.  VALUE IS CONSISTENT WITH PREVIOUSLY REPORTED AND CALLED VALUE. (NOTE) Elevated high sensitivity troponin I (hsTnI) values and significant  changes across serial measurements may suggest ACS but many other  chronic and acute conditions are known to elevate hsTnI results.  Refer to the "Links" section for chest pain algorithms and additional  guidance. Performed at [facility], [place] [address] [place], [place] [address]    DG Chest Port 1 View  Result Date: 10/14/2022 CLINICAL DATA:  Chest pain, cough. EXAM: PORTABLE CHEST 1 VIEW COMPARISON:  01/27/2022. FINDINGS: Low lung volumes accentuate the pulmonary vasculature and cardiomediastinal silhouette. Unchanged scarring at the right-greater-than-left lung bases with associated volume loss. No new airspace opacity. Stable cardiac and mediastinal contours. No pneumothorax or pleural effusion. IMPRESSION: Unchanged scarring at the right-greater-than-left lung bases with associated volume loss. No new airspace opacity. Electronically Signed   By: Emmit Alexanders M.D.   On: 10/14/2022 09:15    Pending Labs Unresulted Labs (From admission, onward)     Start     Ordered   10/21/22 0500  Creatinine, serum  (enoxaparin (LOVENOX)    CrCl >/= 30 ml/min)  Weekly,   R     Comments: while on enoxaparin therapy    10/14/22 1419   10/15/22 0500   Comprehensive metabolic panel  Tomorrow morning,   R        10/14/22 1419   10/15/22 0500  CBC  Tomorrow morning,   R        10/14/22 1419   10/14/22 1417  Magnesium  Add-on,   AD        10/14/22 1419            Vitals/Pain Today's Vitals   10/14/22 1200 10/14/22 1249 10/14/22 1330 10/14/22 1400  BP: 100/66  100/65 110/74  Pulse: (!) 103  (!) 106 (!) 105  Resp: 15  15 16   Temp:  98.3 F (36.8 C)    TempSrc:      SpO2: 94%  94% 95%  Weight:      Height:      PainSc:        Isolation Precautions Airborne and Contact precautions  Medications Medications  fenofibrate tablet 160 mg (has no administration in time range)  ezetimibe (ZETIA) tablet 10 mg (has no administration in time range)  ALPRAZolam (XANAX) tablet 1 mg (has no administration in time range)  pantoprazole (PROTONIX) EC tablet 40 mg (has no administration in time range)  carbamazepine (CARBATROL) 12 hr capsule 400 mg (has no administration in time range)  fluticasone furoate-vilanterol (BREO ELLIPTA) 200-25 MCG/ACT 1 puff (has no administration in time range)    And  umeclidinium bromide (INCRUSE ELLIPTA) 62.5 MCG/ACT 1 puff (has no administration in time range)  levalbuterol (XOPENEX) nebulizer solution 0.63 mg (has no administration in time range)  prednisoLONE acetate (PRED FORTE) 1 % ophthalmic suspension 1 drop (has no administration in time range)  tobramycin (TOBREX) 0.3 % ophthalmic solution 1 drop (has no administration in time range)  enoxaparin (LOVENOX) injection 40 mg (has no administration in time range)  sodium chloride flush (NS) 0.9 % injection 3 mL (0 mLs Intravenous Hold 10/14/22 1454)  acetaminophen (TYLENOL) tablet 650 mg (650 mg Oral Given 10/14/22 1452)    Or  acetaminophen (TYLENOL) suppository 650 mg ( Rectal See Alternative 10/14/22 1452)  polyethylene glycol (MIRALAX / GLYCOLAX) packet 17 g (has no administration in time range)  cyclobenzaprine (FLEXERIL) tablet 5 mg (has no  administration in time range)  LORazepam (ATIVAN) injection 0.5 mg (0.5 mg Intravenous Given 10/14/22 1106)  sodium chloride 0.9 % bolus 500 mL (0 mLs Intravenous Stopped 10/14/22 1300)  cyclobenzaprine (FLEXERIL) tablet 5 mg (5 mg Oral Given 10/14/22 1149)  aspirin chewable tablet 324 mg (324 mg Oral Given 10/14/22 1309)    Mobility walks with device     Focused Assessments Cardiac Assessment Handoff:    No results found for: "CKTOTAL", "CKMB", "CKMBINDEX", "TROPONINI" Lab Results  Component Value Date   DDIMER 0.38 01/02/2022   Does the Patient currently have chest pain? No   , Neuro Assessment Handoff:  Swallow screen pass? Yes          Neuro Assessment:   Neuro Checks:      Has TPA been given? No If patient is a Neuro Trauma and patient is going to OR before floor call report to [facility] nurse: [phone] or [phone]   R Recommendations: See Admitting Provider Note  Report given to:   Additional Notes:

## 2022-10-14 NOTE — H&P (Signed)
History and Physical   Brittany Irwin [id number] DOB: 10-15-1940 DOA: 10/14/2022  PCP: Sarina Ser, MD   Patient coming from: Home  Chief Complaint: Chest pain  HPI: Brittany Irwin is a 82 y.o. female with medical history significant of hypertension, chronic diastolic CHF, chronic disease, CKD 3A, chronic back pain, anemia, COPD, chronic respiratory failure with hypoxia, anxiety presenting with chest pain.  Patient states that she woke up this morning with leg cramps and noted to have significant substernal chest pain.  She took one of her nitroglycerin with improvement in her chest pain.  Did call EMS and upon transfer by EMS was pain-free, she did decline aspirin at that time.  She further reports a couple days of shortness of breath and intermittent lightheadedness and taking in a dose of her as needed metolazone for this.  States that her dry weight is typically around 150 pounds and yesterday was noted to be 154 pounds.  Of note, she is chronically on 3 to 4 L of home oxygen.  She denies fevers, chills, abdominal pain, constipation, diarrhea, nausea, vomiting.  ED Course: Vital signs in the ED notable for heart rate in the 60s to 120s.  Did have an episode of wide-complex tachycardia while in the ED, which responded to Ativan before ordered amiodarone could be administered.  Blood pressure also notable to be variable in the ED with a range of [id number] to [id number] systolic.  She remains on her chronic 3 L supplemental oxygen.  Lab workup included CMP with sodium 133, chloride 93, bicarb 21, gap 19, BUN 36, creatinine near baseline at 1.27 from a baseline of 1.1, glucose 100.  CBC with hemoglobin stable 11.8.  BMP borderline and is near previous at 321.  Troponin elevated at 196 with repeat pending.  COVID screen negative.  Chest x-ray with stable scarring right greater than left at the bases with associated volume loss.  Patient received aspirin, Ativan, 500 cc of IV fluid in the ED.   Cardiology was consulted with the above wide-complex tachycardia and considering she is presenting with possible cardiac issue.  Review of Systems: As per HPI otherwise all other systems reviewed and are negative.  Past Medical History:  Diagnosis Date   Acute CHF (congestive heart failure) ([place]) 01/02/2022   AKI (acute kidney injury) ([place]) 09/08/2020   Aspiration pneumonia ([place]) 01/05/2019   Atypical chest pain 03/11/2022   Bradycardia 03/14/2020   C. difficile colitis 04/03/2021   CHF (congestive heart failure) ([place])    Coronary artery disease    Tachycardia 05/31/2013    Past Surgical History:  Procedure Laterality Date   ABDOMINAL HYSTERECTOMY     BOWEL RESECTION     CHOLECYSTECTOMY      Social History  reports that she has quit smoking. Her smoking use included cigarettes. She does not have any smokeless tobacco history on file. She reports that she does not currently use alcohol. She reports that she does not use drugs.  Allergies  Allergen Reactions   Azulfidine [Sulfasalazine] Other (See Comments)    Headache    Epipen [Epinephrine] Hypertension   Flagyl [Metronidazole] Hives   Motrin [Ibuprofen] Nausea Only and Other (See Comments)    Told not to take medication due to GI distress caused by crohn's disease.   Nsaids Other (See Comments)    Told not to take NSAIDs due to GI distress caused by crohn's disease   Penicillins Hives   Morphine And Related Other (See Comments)  Told not to take due to GI distress caused by crohn's disease    History reviewed. No pertinent family history.  Prior to Admission medications   Medication Sig Start Date End Date Taking? Authorizing Provider  acetaminophen-codeine (TYLENOL #4) 300-60 MG tablet Take 1 tablet by mouth 4 (four) times daily as needed for pain. 12/31/21  Yes [provider]  Aloe-Sodium Chloride (AYR SALINE NASAL GEL NA) Place 1 Application into the nose 2 (two) times daily as needed (Dry nose).   Yes  [provider]  ALPRAZolam Duanne Moron) 1 MG tablet Take 1 mg by mouth 3 (three) times daily as needed for anxiety. 01/02/22  Yes [provider]  atenolol (TENORMIN) 25 MG tablet Take 12.5-25 mg by mouth See admin instructions. 12.5 mg if heart rate < 60 25 mg if heart rate > 60 01/02/22  Yes [provider]  carbamazepine (CARBATROL) 200 MG 12 hr capsule Take 400 mg by mouth 2 (two) times daily. 12/19/21  Yes [provider]  cyanocobalamin (,VITAMIN B-12,) 1000 MCG/ML injection Inject 1,000 mcg into the muscle every 30 (thirty) days.   Yes [provider]  cyclobenzaprine (FLEXERIL) 5 MG tablet Take 5 mg by mouth 3 (three) times daily as needed for muscle spasms. 10/13/22  Yes [provider]  diclofenac Sodium (VOLTAREN ARTHRITIS PAIN) 1 % GEL Apply 1 Application topically 2 (two) times daily as needed (back pain).   Yes [provider]  ergocalciferol (VITAMIN D2) 1.25 MG (50000 UT) capsule Take 50,000 Units by mouth every Friday.   Yes [provider]  ezetimibe (ZETIA) 10 MG tablet Take 10 mg by mouth at bedtime. 12/31/21  Yes [provider]  fenofibrate micronized (LOFIBRA) 200 MG capsule Take 200 mg by mouth daily. 12/31/21  Yes [provider]  furosemide (LASIX) 20 MG tablet Take 60 mg by mouth in the morning and at bedtime. 12/31/21  Yes [provider]  gabapentin (NEURONTIN) 100 MG capsule Take 100 mg by mouth daily. 12/31/21  Yes [provider]  levalbuterol (XOPENEX HFA) 45 MCG/ACT inhaler Inhale 2 puffs into the lungs every 6 (six) hours as needed for shortness of breath or wheezing. 12/23/21  Yes [provider]  losartan (COZAAR) 25 MG tablet Take 1 tablet (25 mg total) by mouth daily. 01/05/22  Yes Arrien, Jimmy Picket, MD  Menthol, Topical Analgesic, (ICY HOT BACK EX) Apply 1 Application topically 2 (two) times daily as needed (back pain).   Yes [provider]   metolazone (ZAROXOLYN) 5 MG tablet Take 1 tablet (5 mg total) by mouth daily as needed (edema). Please take as needed in case of weight gain 3 lbs in 24 to 48 hrs or 5 lbs in 7 days. 01/04/22  Yes Arrien, Jimmy Picket, MD  nitroGLYCERIN (NITROSTAT) 0.4 MG SL tablet Place 0.4 mg under the tongue every 5 (five) minutes x 3 doses as needed for chest pain. 12/31/21  Yes [provider]  pantoprazole (PROTONIX) 40 MG tablet Take 40 mg by mouth daily. 12/31/21  Yes [provider]  potassium chloride SA (KLOR-CON M) 20 MEQ tablet Take 20 mEq by mouth daily. 12/31/21  Yes [provider]  prednisoLONE acetate (PRED FORTE) 1 % ophthalmic suspension Place 1 drop into the left eye 4 (four) times daily. 02/26/22  Yes [provider]  tobramycin (TOBREX) 0.3 % ophthalmic solution Place 1 drop into the left eye in the morning, at noon, in the evening, and at bedtime. 02/26/22  Yes [provider]  Donnal Debar 200-62.5-25 MCG/ACT AEPB Inhale 1 puff into the lungs daily. 12/31/21  Yes [provider]    Physical Exam: Vitals:   10/14/22 1200 10/14/22 1249 10/14/22 1330 10/14/22 1400  BP: 100/66  100/65 110/74  Pulse: (!) 103  (!) 106 (!) 105  Resp: 15  15 16   Temp:  98.3 F (36.8 C)    TempSrc:      SpO2: 94%  94% 95%  Weight:      Height:        Physical Exam Constitutional:      General: She is not in acute distress.    Appearance: Normal appearance.  HENT:     Head: Normocephalic and atraumatic.     Mouth/Throat:     Mouth: Mucous membranes are moist.     Pharynx: Oropharynx is clear.  Eyes:     Extraocular Movements: Extraocular movements intact.     Pupils: Pupils are equal, round, and reactive to light.  Cardiovascular:     Rate and Rhythm: Normal rate and regular rhythm.     Pulses: Normal pulses.     Heart sounds: Normal heart sounds.  Pulmonary:     Effort: Pulmonary effort is normal. No respiratory distress.     Breath  sounds: Normal breath sounds.  Abdominal:     General: Bowel sounds are normal. There is no distension.     Palpations: Abdomen is soft.     Tenderness: There is no abdominal tenderness.  Musculoskeletal:        General: No swelling or deformity.  Skin:    General: Skin is warm and dry.  Neurological:     General: No focal deficit present.     Mental Status: Mental status is at baseline.     Labs on Admission: I have personally reviewed following labs and imaging studies  CBC: Recent Labs  Lab 10/14/22 0850 10/14/22 1122  WBC 6.2  --   HGB 11.8* 12.9  HCT 36.3 38.0  MCV 95.0  --   PLT 177  --     Basic Metabolic Panel: Recent Labs  Lab 10/14/22 1100 10/14/22 1122  NA 133* 135  K 4.9 4.4  CL 93* 98  CO2 21*  --   GLUCOSE 100* 121*  BUN 36* 34*  CREATININE 1.27* 1.30*  CALCIUM 9.0  --     GFR: Estimated Creatinine Clearance: 32.8 mL/min (A) (by C-G formula based on SCr of 1.3 mg/dL (H)).  Liver Function Tests: Recent Labs  Lab 10/14/22 1100  AST 35  ALT 12  ALKPHOS 32*  BILITOT 0.8  PROT 7.0  ALBUMIN 3.8    Urine analysis:    Component Value Date/Time   COLORURINE YELLOW 01/27/2022 2143   APPEARANCEUR CLEAR 01/27/2022 2143   LABSPEC 1.013 01/27/2022 2143   PHURINE 5.0 01/27/2022 2143   GLUCOSEU NEGATIVE 01/27/2022 2143   HGBUR NEGATIVE 01/27/2022 2143   BILIRUBINUR NEGATIVE 01/27/2022 2143   [place] NEGATIVE 01/27/2022 2143   PROTEINUR NEGATIVE 01/27/2022 2143   NITRITE NEGATIVE 01/27/2022 2143   LEUKOCYTESUR NEGATIVE 01/27/2022 2143    Radiological Exams on Admission: DG Chest Port 1 View  Result Date: 10/14/2022 CLINICAL DATA:  Chest pain, cough. EXAM: PORTABLE CHEST 1 VIEW COMPARISON:  01/27/2022. FINDINGS: Low lung volumes accentuate the pulmonary vasculature and cardiomediastinal silhouette. Unchanged scarring at the right-greater-than-left lung bases with associated volume loss. No new airspace opacity. Stable cardiac and mediastinal  contours. No pneumothorax or pleural  effusion. IMPRESSION: Unchanged scarring at the right-greater-than-left lung bases with associated volume loss. No new airspace opacity. Electronically Signed   By: Emmit Alexanders M.D.   On: 10/14/2022 09:15    EKG: Independently reviewed.  Initial EKG with sinus rhythm at 63 bpm.  Nonspecific intraventricular conduction today with appearance of possible left bundle branch block.  First-degree AV block.  QTc borderline at 478.  This was similar to previous.  Repeat EKG showed wide QRS tachycardia at 122.  Unclear rhythm suspected to be aberrant conduction.  Will await cardiology read.  Assessment/Plan Principal Problem:   Chest pain, rule out acute myocardial infarction Active Problems:   Essential hypertension   Chronic kidney disease, stage 3a (HCC)   Chronic anemia   Crohn disease (HCC)   Chronic back pain   Chronic diastolic (congestive) heart failure (HCC)   Chest pain > Presented with substernal chest pain resolved with nitroglycerin.  Troponin elevated to 196, appears to be somewhat chronically elevated. > EKG without ischemic changes did have transient episode of wide-complex tachycardia that has resolved after receiving anxiety medication as below. - Monitor on telemetry - Appreciate cardiology recommendations - Trend troponin - Echocardiogram  Wide-complex tachycardia > Episode of wide-complex tachycardia which improved with antianxiety medication in the ED.  Amiodarone had been ordered but not yet given at the time of resolution. > Cardiology consulted to rule out possible run of V. tach though appears to be more likely aberrant conduction. - Telemetry - Appreciate cardiology recommendations - Check magnesium  Hypertension Chronic diastolic CHF > Last echo was in 2023 with EF 60 and 65%, G1 DD, normal RV function. > Noted she had some increased shortness of breath last couple days and her weight was up to 154 from a dry weight of 150  yesterday.  Subsequently she took a dose of her metolazone. > Today her weight in the ED is 151 and she had a mild bump in her creatinine possibly indicating overdiuresis.  Did receive 500 cc in the ED. - Continue to monitor on telemetry - Resume home Lasix tomorrow - Holding home atenolol, losartan for now given some lower blood pressures in the ED.  CKD 3a > Creatinine stable but mildly elevated at 1.27 from baseline 1.1.  Does have mild elevated anion gap metabolic acidosis with bicarb 21 and gap of 19. > Received 500 cc IV fluids in the ED. -Trend renal function and electrolytes  Chronic back pain - Continue as needed Flexeril  Anemia > Hemoglobin stable at 11.8. - Trend CBC  Anxiety - Continue home as needed Xanax  Migraines - Continue home carbamazepine  COPD Chronic respiratory failure with hypoxia - Replace home Trelegy with formulary Breo and Incruse - Continue home as needed Xopenex  DVT prophylaxis: Lovenox Code Status:   Full Family Communication:  Updated at bedside  Disposition Plan:   Patient is from:  Home  Anticipated DC to:  Home  Anticipated DC date:  1 to 2 days  Anticipated DC barriers: None  Consults called:  Cardiology Admission status:  Observation, telemetry  Severity of Illness: The appropriate patient status for this patient is OBSERVATION. Observation status is judged to be reasonable and necessary in order to provide the required intensity of service to ensure the patient's safety. The patient's presenting symptoms, physical exam findings, and initial radiographic and laboratory data in the context of their medical condition is felt to place them at decreased risk for further clinical deterioration. Furthermore, it is anticipated that  the patient will be medically stable for discharge from the hospital within 2 midnights of admission.   Marcelyn Bruins MD [facility]  How to contact the [facility] Attending or Consulting provider [place]  or covering provider during after hours [place], for this patient?   Check the care team in [facility] and look for a) attending/consulting TRH provider listed and b) the [facility] team listed Log into [email or web address] and use [place]'s universal password to access. If you do not have the password, please contact the hospital operator. Locate the [facility] provider you are looking for under [facility] and page to a number that you can be directly reached. If you still have difficulty reaching the provider, please page the [facility] (Director on Call) for the Hospitalists listed on amion for assistance.  10/14/2022, 2:19 PM

## 2022-10-15 ENCOUNTER — Observation Stay (HOSPITAL_COMMUNITY): Payer: Medicare Other

## 2022-10-15 DIAGNOSIS — K5090 Crohn's disease, unspecified, without complications: Secondary | ICD-10-CM | POA: Diagnosis present

## 2022-10-15 DIAGNOSIS — Z880 Allergy status to penicillin: Secondary | ICD-10-CM | POA: Diagnosis not present

## 2022-10-15 DIAGNOSIS — R000 Tachycardia, unspecified: Secondary | ICD-10-CM | POA: Diagnosis present

## 2022-10-15 DIAGNOSIS — Z79899 Other long term (current) drug therapy: Secondary | ICD-10-CM | POA: Diagnosis not present

## 2022-10-15 DIAGNOSIS — N1831 Chronic kidney disease, stage 3a: Secondary | ICD-10-CM | POA: Diagnosis present

## 2022-10-15 DIAGNOSIS — R079 Chest pain, unspecified: Secondary | ICD-10-CM

## 2022-10-15 DIAGNOSIS — J449 Chronic obstructive pulmonary disease, unspecified: Secondary | ICD-10-CM | POA: Diagnosis present

## 2022-10-15 DIAGNOSIS — J9611 Chronic respiratory failure with hypoxia: Secondary | ICD-10-CM | POA: Diagnosis present

## 2022-10-15 DIAGNOSIS — I21A1 Myocardial infarction type 2: Secondary | ICD-10-CM | POA: Diagnosis present

## 2022-10-15 DIAGNOSIS — Z1152 Encounter for screening for COVID-19: Secondary | ICD-10-CM | POA: Diagnosis not present

## 2022-10-15 DIAGNOSIS — I5A Non-ischemic myocardial injury (non-traumatic): Secondary | ICD-10-CM | POA: Diagnosis not present

## 2022-10-15 DIAGNOSIS — D638 Anemia in other chronic diseases classified elsewhere: Secondary | ICD-10-CM | POA: Diagnosis present

## 2022-10-15 DIAGNOSIS — I2510 Atherosclerotic heart disease of native coronary artery without angina pectoris: Secondary | ICD-10-CM | POA: Diagnosis present

## 2022-10-15 DIAGNOSIS — R001 Bradycardia, unspecified: Secondary | ICD-10-CM | POA: Diagnosis present

## 2022-10-15 DIAGNOSIS — I5032 Chronic diastolic (congestive) heart failure: Secondary | ICD-10-CM | POA: Diagnosis present

## 2022-10-15 DIAGNOSIS — G43909 Migraine, unspecified, not intractable, without status migrainosus: Secondary | ICD-10-CM | POA: Diagnosis present

## 2022-10-15 DIAGNOSIS — R42 Dizziness and giddiness: Secondary | ICD-10-CM | POA: Diagnosis not present

## 2022-10-15 DIAGNOSIS — E872 Acidosis, unspecified: Secondary | ICD-10-CM | POA: Diagnosis present

## 2022-10-15 DIAGNOSIS — Z9981 Dependence on supplemental oxygen: Secondary | ICD-10-CM | POA: Diagnosis not present

## 2022-10-15 DIAGNOSIS — Z87891 Personal history of nicotine dependence: Secondary | ICD-10-CM | POA: Diagnosis not present

## 2022-10-15 DIAGNOSIS — M549 Dorsalgia, unspecified: Secondary | ICD-10-CM | POA: Diagnosis present

## 2022-10-15 DIAGNOSIS — Z885 Allergy status to narcotic agent status: Secondary | ICD-10-CM | POA: Diagnosis not present

## 2022-10-15 DIAGNOSIS — J8410 Pulmonary fibrosis, unspecified: Secondary | ICD-10-CM | POA: Diagnosis present

## 2022-10-15 DIAGNOSIS — F419 Anxiety disorder, unspecified: Secondary | ICD-10-CM | POA: Diagnosis present

## 2022-10-15 DIAGNOSIS — G8929 Other chronic pain: Secondary | ICD-10-CM | POA: Diagnosis present

## 2022-10-15 DIAGNOSIS — I130 Hypertensive heart and chronic kidney disease with heart failure and stage 1 through stage 4 chronic kidney disease, or unspecified chronic kidney disease: Secondary | ICD-10-CM | POA: Diagnosis present

## 2022-10-15 DIAGNOSIS — I214 Non-ST elevation (NSTEMI) myocardial infarction: Secondary | ICD-10-CM | POA: Diagnosis present

## 2022-10-15 LAB — ECHOCARDIOGRAM COMPLETE
AR max vel: 3.52 cm2
AV Area VTI: 3.42 cm2
AV Area mean vel: 3.48 cm2
AV Mean grad: 2 mmHg
AV Peak grad: 4.3 mmHg
Ao pk vel: 1.04 m/s
Area-P 1/2: 2.52 cm2
Height: 64 in
S' Lateral: 3.1 cm
Weight: 2426.82 oz

## 2022-10-15 LAB — COMPREHENSIVE METABOLIC PANEL
ALT: 11 U/L (ref 0–44)
AST: 31 U/L (ref 15–41)
Albumin: 3.2 g/dL — ABNORMAL LOW (ref 3.5–5.0)
Alkaline Phosphatase: 28 U/L — ABNORMAL LOW (ref 38–126)
Anion gap: 10 (ref 5–15)
BUN: 33 mg/dL — ABNORMAL HIGH (ref 8–23)
CO2: 28 mmol/L (ref 22–32)
Calcium: 8.7 mg/dL — ABNORMAL LOW (ref 8.9–10.3)
Chloride: 97 mmol/L — ABNORMAL LOW (ref 98–111)
Creatinine, Ser: 1.32 mg/dL — ABNORMAL HIGH (ref 0.44–1.00)
GFR, Estimated: 41 mL/min — ABNORMAL LOW (ref >60)
Glucose, Bld: 125 mg/dL — ABNORMAL HIGH (ref 70–99)
Potassium: 3.4 mmol/L — ABNORMAL LOW (ref 3.5–5.1)
Sodium: 135 mmol/L (ref 135–145)
Total Bilirubin: 0.7 mg/dL (ref 0.3–1.2)
Total Protein: 6.3 g/dL — ABNORMAL LOW (ref 6.5–8.1)

## 2022-10-15 LAB — CBC
HCT: 35.3 % — ABNORMAL LOW (ref 36.0–46.0)
Hemoglobin: 11.4 g/dL — ABNORMAL LOW (ref 12.0–15.0)
MCH: 30.6 pg (ref 26.0–34.0)
MCHC: 32.3 g/dL (ref 30.0–36.0)
MCV: 94.9 fL (ref 80.0–100.0)
Platelets: 258 10*3/uL (ref 150–400)
RBC: 3.72 MIL/uL — ABNORMAL LOW (ref 3.87–5.11)
RDW: 12.7 % (ref 11.5–15.5)
WBC: 5.8 10*3/uL (ref 4.0–10.5)
nRBC: 0 % (ref 0.0–0.2)

## 2022-10-15 LAB — TROPONIN I (HIGH SENSITIVITY)
Troponin I (High Sensitivity): 427 ng/L (ref <18)
Troponin I (High Sensitivity): 360 ng/L (ref <18)
Troponin I (High Sensitivity): 370 ng/L (ref <18)

## 2022-10-15 MED ORDER — ATENOLOL 25 MG PO TABS
12.5000 mg | ORAL_TABLET | Freq: Two times a day (BID) | ORAL | Status: DC
  Administered 2022-10-15: 12.5 mg via ORAL
  Filled 2022-10-15 (×4): qty 0.5

## 2022-10-15 MED ORDER — CYCLOBENZAPRINE HCL 10 MG PO TABS
10.0000 mg | ORAL_TABLET | Freq: Three times a day (TID) | ORAL | Status: DC | PRN
  Administered 2022-10-15 – 2022-10-16 (×2): 10 mg via ORAL
  Filled 2022-10-15 (×3): qty 1

## 2022-10-15 MED ORDER — ACETAMINOPHEN-CODEINE 300-30 MG PO TABS
1.0000 | ORAL_TABLET | Freq: Four times a day (QID) | ORAL | Status: DC | PRN
  Administered 2022-10-15 – 2022-10-17 (×4): 2 via ORAL
  Filled 2022-10-15 (×3): qty 2
  Filled 2022-10-15: qty 1
  Filled 2022-10-15 (×2): qty 2

## 2022-10-15 MED ORDER — POTASSIUM CHLORIDE 20 MEQ PO PACK
40.0000 meq | PACK | Freq: Once | ORAL | Status: AC
  Administered 2022-10-15: 40 meq via ORAL
  Filled 2022-10-15: qty 2

## 2022-10-15 NOTE — TOC Initial Note (Signed)
Transition of Care [facility]) - Initial/Assessment Note    Patient Details  Name: Brittany Irwin MRN: [id number] Date of Birth: 1940/09/11  [facility]) CM/SW Contact:    Zenon Mayo, RN Phone Number: 10/15/2022, 3:49 PM  Clinical Narrative:                 From home alone, she has rollator, home oxygen 3-4 liters with [employer], grab bars. She states her sister Candelaria Celeste will transport her home at dc. Vaughan Basta lives about 5 to 10 mins away from her.  She will get a shower chair from [place].    Expected Discharge Plan: [place] Barriers to Discharge: Continued Medical Work up   Patient Goals and CMS Choice Patient states their goals for this hospitalization and ongoing recovery are:: return home CMS Medicare.gov Compare Post Acute Care list provided to:: Patient Choice offered to / list presented to : Patient      Expected Discharge Plan and Services In-house Referral: NA Discharge Planning Services: CM Consult Post Acute Care Choice: Home Health Living arrangements for the past 2 months: Single Family Home                 DME Arranged: N/A DME Agency: NA       HH Arranged: RN, Disease Management, PT [place] Agency: [place] Date [facility] Agency Contacted: 10/15/22 Time [place]: 67 Representative spoke with at [place]: Cyndi  Prior Living Arrangements/Services Living arrangements for the past 2 months: [place] with:: Self Patient language and need for interpreter reviewed:: Yes Do you feel safe going back to the place where you live?: Yes      Need for Family Participation in Patient Care: Yes (Comment) Care giver support system in place?: Yes (comment) Current home services: DME (rollator, home oxygen 3-4 liters [employer], grab bars) Criminal Activity/Legal Involvement Pertinent to Current Situation/Hospitalization: No - Comment as needed  Activities of Daily Living Home Assistive  Devices/Equipment: Eyeglasses, Grab bars around toilet, Grab bars in shower, Hand-held shower hose, Oxygen ADL Screening (condition at time of admission) Patient's cognitive ability adequate to safely complete daily activities?: Yes Is the patient deaf or have difficulty hearing?: No Does the patient have difficulty seeing, even when wearing glasses/contacts?: No Does the patient have difficulty concentrating, remembering, or making decisions?: No Patient able to express need for assistance with ADLs?: Yes Does the patient have difficulty dressing or bathing?: No Independently performs ADLs?: Yes (appropriate for developmental age) Does the patient have difficulty walking or climbing stairs?: Yes Weakness of Legs: None Weakness of Arms/Hands: None  Permission Sought/Granted                  Emotional Assessment Appearance:: Appears stated age Attitude/Demeanor/Rapport: Engaged Affect (typically observed): Appropriate Orientation: : Oriented to Self, Oriented to Place, Oriented to  Time, Oriented to Situation Alcohol / Substance Use: Not Applicable Psych Involvement: No (comment)  Admission diagnosis:  Wide-complex tachycardia [R00.0] Elevated troponin [R79.89] Chest pain, rule out acute myocardial infarction [R07.9] Chest pain, unspecified type [R07.9] NSTEMI (non-ST elevated myocardial infarction) [facility]) [I21.4] Patient Active Problem List   Diagnosis Date Noted   NSTEMI (non-ST elevated myocardial infarction) ([place]) 10/15/2022   Chest pain, rule out acute myocardial infarction 10/14/2022   Chronic respiratory failure with hypoxia ([place]) 03/12/2022   Essential hypertension 01/03/2022   Chronic kidney disease, stage 3a ([place]) 01/03/2022   Chronic anemia 01/03/2022   Crohn disease (  [place]) 01/03/2022   Chronic back pain 01/03/2022   Chronic diastolic (congestive) heart failure ([place]) 10/04/2013   COPD (chronic obstructive pulmonary disease) ([name]) 04/08/2012   Anxiety 03/19/2012    PCP:  Sarina Ser, MD Pharmacy:   [place], [address] [id number] Phone: [phone] Fax: [phone]     Social Determinants of Health (SDOH) Social History: SDOH Screenings   Food Insecurity: No Food Insecurity (10/14/2022)  Housing: Low Risk  (10/14/2022)  Transportation Needs: No Transportation Needs (10/14/2022)  Utilities: Not At Risk (10/14/2022)  Tobacco Use: Medium Risk (10/14/2022)   SDOH Interventions:     Readmission Risk Interventions     No data to display

## 2022-10-15 NOTE — Progress Notes (Addendum)
Rounding Note    Patient Name: Brittany Irwin Date of Encounter: 10/15/2022  [place] Cardiologist: Noe Gens, DO   Subjective   82 yof with hx of HTN, CHF, CKD 3a/b, COPD and possible pulmonary fibrosis on 3-4L O2 at baseline, Crohn's Dz, anxiety on chronic xanax who presented via EMS for chest pain for 2 hours of substernal cramping chest tightness that awoke her from sleep. Cardiology consulted for evaluation  She says she is feeling better overall. No longer complaining of leg pain.  Not having any more chest pain. Does feel more DOE.  Vitals stable. Has had some bradycardia this morning into 50s. Says this is normal for her. Has not had any BB here. ? Xanax and msk relaxer? Trops trending up now 400s baseline 200s. Not on ASA Tele shows ST elevation in lead 2, repeat EKG reassuring. K 3.4 Otherwise RRR, no murmur, no JVD no LEE, lungs clear.   Inpatient Medications    Scheduled Meds:  Zetia, fenofibrate Protonix Lovenox Carbamazepine  Continuous Infusions: none  PRN Meds:  Xanax Flexeril Breo ellipta Levalbuterol    Vital Signs    Vitals:   10/15/22 0034 10/15/22 0450 10/15/22 0800 10/15/22 0830  BP: (!) 119/54 (!) 130/50 (!) 122/58   Pulse: (!) 56 (!) 50 70   Resp: (!) 22 18 20    Temp: 98.7 F (37.1 C) (!) 97.2 F (36.2 C)    TempSrc: Oral Oral    SpO2: 95% 94% 93% 91%  Weight:  68.8 kg    Height:        Intake/Output Summary (Last 24 hours) at 10/15/2022 1300 Last data filed at 10/15/2022 0800 Gross per 24 hour  Intake --  Output 750 ml  Net -750 ml      10/15/2022    4:50 AM 10/14/2022    5:00 PM 10/14/2022    8:36 AM  Last 3 Weights  Weight (lbs) 151 lb 10.8 oz 151 lb 10.8 oz 151 lb  Weight (kg) 68.8 kg 68.8 kg 68.493 kg      Telemetry    NSR with ST elevation in lead II- Personally Reviewed  ECG   Initial EKG largely unchanged compared to prior EKG in June.  Possible rate related IVCD, does not appear consistent with  STEMI - Personally Reviewed 3/28 EKG NSR with no ST elevation  Physical Exam   GEN: No acute distress. Neck: No JVD Cardiac: RRR, no murmurs, rubs, or gallops.  Respiratory: Clear to auscultation bilaterally. GI: Soft, nontender, non-distended  MS: No edema; No deformity.  Neuro:  Nonfocal  Psych: Normal affect   Labs    High Sensitivity Troponin:   Recent Labs  Lab 10/14/22 1037 10/14/22 1300 10/15/22 0803  TROPONINIHS 196* 228* 427*     Chemistry Recent Labs  Lab 10/14/22 1100 10/14/22 1118 10/14/22 1122 10/15/22 0028  NA 133*  --  135 135  K 4.9  --  4.4 3.4*  CL 93*  --  98 97*  CO2 21*  --   --  28  GLUCOSE 100*  --  121* 125*  BUN 36*  --  34* 33*  CREATININE 1.27*  --  1.30* 1.32*  CALCIUM 9.0  --   --  8.7*  MG  --  2.2  --   --   PROT 7.0  --   --  6.3*  ALBUMIN 3.8  --   --  3.2*  AST 35  --   --  31  ALT 12  --   --  11  ALKPHOS 32*  --   --  28*  BILITOT 0.8  --   --  0.7  GFRNONAA 43*  --   --  41*  ANIONGAP 19*  --   --  10    Lipids No results for input(s): "CHOL", "TRIG", "HDL", "LABVLDL", "LDLCALC", "CHOLHDL" in the last 168 hours.  Hematology Recent Labs  Lab 10/14/22 0850 10/14/22 1122 10/15/22 0028  WBC 6.2  --  5.8  RBC 3.82*  --  3.72*  HGB 11.8* 12.9 11.4*  HCT 36.3 38.0 35.3*  MCV 95.0  --  94.9  MCH 30.9  --  30.6  MCHC 32.5  --  32.3  RDW 13.1  --  12.7  PLT 177  --  258   Thyroid No results for input(s): "TSH", "FREET4" in the last 168 hours.  BNP Recent Labs  Lab 10/14/22 0900  BNP 321.6*    DDimer No results for input(s): "DDIMER" in the last 168 hours.   Radiology    DG Chest Port 1 View  Result Date: 10/14/2022 CLINICAL DATA:  Chest pain, cough. EXAM: PORTABLE CHEST 1 VIEW COMPARISON:  01/27/2022. FINDINGS: Low lung volumes accentuate the pulmonary vasculature and cardiomediastinal silhouette. Unchanged scarring at the right-greater-than-left lung bases with associated volume loss. No new airspace opacity.  Stable cardiac and mediastinal contours. No pneumothorax or pleural effusion. IMPRESSION: Unchanged scarring at the right-greater-than-left lung bases with associated volume loss. No new airspace opacity. Electronically Signed   By: Emmit Alexanders M.D.   On: 10/14/2022 09:15    Cardiac Studies   ECHO: IMPRESSIONS   1. Left ventricular ejection fraction, by estimation, is 60 to 65%. The  left ventricle has normal function. The left ventricle has no regional  wall motion abnormalities. There is moderate concentric left ventricular  hypertrophy. Left ventricular  diastolic parameters are consistent with Grade I diastolic dysfunction  (impaired relaxation). Elevated left ventricular end-diastolic pressure.   2. Right ventricular systolic function is normal. The right ventricular  size is normal. There is mildly elevated pulmonary artery systolic  pressure.   3. Left atrial size was mildly dilated.   4. A small pericardial effusion is present. There is no evidence of  cardiac tamponade.   5. The mitral valve is normal in structure. Trivial mitral valve  regurgitation. No evidence of mitral stenosis. Moderate mitral annular  calcification.   6. The aortic valve is tricuspid. Aortic valve regurgitation is trivial.  No aortic stenosis is present.   7. Aortic dilatation noted. There is borderline dilatation of the  ascending aorta, measuring 36 mm.   8. The inferior vena cava is dilated in size with <50% respiratory  variability, suggesting right atrial pressure of 15 mmHg.   Cardiac Cath 2021: FINDINGS:  Coronary Angiography  1. Left Main -no significant stenosis  2. Left anterior descending artery -mild, less than 25% gnosis and mid  vessel  3. Diagonals -large diagonal 1 with no significant stenosis  4. Left Circumflex -mild irregularities  5. Obtuse Marginals -no significant stenosis  6. Right Coronary Artery -mild irregularities  7. Posterior Descending Artery -no significant  stenosis  RECOMMENDATIONS:  -Medical therapy for heart failure preserved ejection fraction.   NM Heart Spect Ph Stress: Impression:  1) SPECT Tc98m Cardiolite stress myocardial perfusion imaging is normal. There is no scintigraphic evidence of regional myocardial ischemia or scar.  2) TID 1.24 (visually there is no TID)  3) There was normal wall thickening post stress.  Patient Profile     82 y.o. female with a hx of HTN, HFpEF, CKD 3a/b, and COPD and possible pulmonary fibrosis presents, Crohn's Dz, and anxiety on chronic Xanax with complaints of substernal chest tightness and leg cramps. Has had multiple evaluations for chest pain and palpitatons in the past with stable troponemia, persistently elevated BNP, and reassuring EKG's, ECHO, cardiac cath, and stress test. Was noted to have some tachycardia with possible IVCD here.   Assessment & Plan    # NSTEMI type II Troponin elevation to 400 this morning, up from baseline of 200. Telemetry indicated some ST elevation in lead II so 12 lead was repeated and is reassuring. She is not having any pain and is HDS so no indication for urgent/emergent cardiac cath. This is likely Type II NSTEMI related to tachycardia yesterday.  - f/u up repeat troponin to ensure downtrend - continue current management  # Atypical Chest Pain - resolved Resolved, no further episodes of chest pain. Suspect this may be driven by anxiety and msk etiology as her prior workups have not revealed any significant coronary disease.  - f/u ECHO  # Possible IVCD - resolved Tachycardia has resolved. She is noted to have bradycardia this morning.  - would continue to monitor on telemetry  - K >4, Mg >2  # HFpEF Stable  - Repeat ECHO - resume lasix at home dose    # Dyspnea # COPD on 3-4L O2 baseline Patient has multiple reasons to be dyspneic. CHF exacerbation is certainly possible, but she does not seem to be volume overloaded at this time. She is currently satting  well on her home 3L O2.   # Leg Cramps Patient asking for Dilaudid. Pain improved with massage by sister at bedside. Has some hypocalcemia, but potassium normal. Would check Magnesium - Management per primary.   Crohn's Dz, anxiety - per primary  For questions or updates, please contact [place] Please consult [email or web address] for contact info under     Signed, Delene Ruffini, MD  10/15/2022, 1:00 PM    Agree with note by Dr Elliot Gurney   Pt admitted with atyp CP and SOB. She also had leg cramps. Trops peaked at 400 prob representing a "Type 2" MI from demand ischemia. 2D showed NL LV FXN. Appears euvolemic on exam. No plans for an invasive evaluation. Probably can be [facility] home tomorrow.  Lorretta Harp, M.D., [place], [facility], [place] [address] [place], [place]  [address]  [phone] 10/15/2022 4:50 PM

## 2022-10-15 NOTE — Progress Notes (Signed)
PROGRESS NOTE  Brittany Irwin [id number] DOB: [birth year] DOA: 10/14/2022 PCP: Sarina Ser, MD  HPI/Recap of past 24 hours: Brittany Irwin is a 82 y.o. female with medical history significant of hypertension, chronic diastolic CHF, chronic disease, CKD 3A, chronic back pain, anemia, COPD, chronic respiratory failure with hypoxia, anxiety presenting with chest pain.  Workup revealed NSTEMI with uptrending troponin, peaked at 427 and down trended.  Seen by cardiology, suspects this is likely type II NSTEMI related to tachycardia yesterday.  10/15/2022: The patient was seen and examined at bedside.  She denies having any chest pain at the time of this exam.  2D echo done today.  It revealed LVEF 60 to 65% with no left ventricle wall motion abnormalities.  Assessment/Plan: Principal Problem:   Chest pain, rule out acute myocardial infarction Active Problems:   Essential hypertension   Chronic kidney disease, stage 3a (HCC)   Chronic anemia   Crohn disease (HCC)   Chronic back pain   Chronic diastolic (congestive) heart failure (HCC)   NSTEMI (non-ST elevated myocardial infarction) ([place])  Chest pain/suspected type II NSTEMI > Presented with substernal chest pain resolved with nitroglycerin. Workup revealed NSTEMI with uptrending troponin, peaked at 427 and down trended.  Seen by cardiology, suspects this is likely type II NSTEMI related to tachycardia yesterday. 2D echo done today.  It revealed LVEF 60 to 65% with no left ventricle wall motion abnormalities.   Wide-complex tachycardia > Cardiology following Management per cardiology.   Hypertension Chronic diastolic CHF > Last echo was in 2023 with EF 60 and 65%, G1 DD, normal RV function. > 2D echo done today.  It revealed LVEF 60 to 65% with no left ventricle wall motion abnormalities.   CKD 3B Appears to be at her baseline creatinine 1 point nephrotoxic agents   Chronic back pain - Continue as needed Flexeril    Anemia of chronic disease > Hemoglobin stable at 11.8. - Trend CBC   Anxiety - Continue home as needed Xanax   Migraines - Continue home carbamazepine   COPD Chronic respiratory failure with hypoxia - Replace home Trelegy with formulary Breo and Incruse - Continue home as needed Xopenex   DVT prophylaxis:      Lovenox Code Status:              Full Family Communication:       Updated at bedside  Disposition Plan:              Patient is from:                        Home             Anticipated DC to:                   Home             Anticipated DC date:               1 to 2 days             Anticipated DC barriers:         None    Consults called:        Cardiology Admission status:     Inpatient status.  The patient requires at least 2 midnights for further evaluation and treatment of present condition.  Objective: Vitals:   10/15/22 0800 10/15/22 0830 10/15/22 1221 10/15/22 1602  BP: (!) 122/58  Marland Kitchen)  169/89 135/66  Pulse: 70  100 67  Resp: 20  20 20   Temp:   98 F (36.7 C)   TempSrc:   Oral   SpO2: 93% 91% 92% 96%  Weight:      Height:        Intake/Output Summary (Last 24 hours) at 10/15/2022 1622 Last data filed at 10/15/2022 1300 Gross per 24 hour  Intake 360 ml  Output 1150 ml  Net -790 ml   Filed Weights   10/14/22 0836 10/14/22 1700 10/15/22 0450  Weight: 68.5 kg 68.8 kg 68.8 kg    Exam:  General: 82 y.o. year-old female well developed well nourished in no acute distress.  Alert and oriented x3. Cardiovascular: Regular rate and rhythm with no rubs or gallops.  No thyromegaly or JVD noted.   Respiratory: Clear to auscultation with no wheezes or rales. Good inspiratory effort. Abdomen: Soft nontender nondistended with normal bowel sounds x4 quadrants. Musculoskeletal: No lower extremity edema. 2/4 pulses in all 4 extremities. Skin: No ulcerative lesions noted or rashes, Psychiatry: Mood is appropriate for condition and setting   Data  Reviewed: CBC: Recent Labs  Lab 10/14/22 0850 10/14/22 1122 10/15/22 0028  WBC 6.2  --  5.8  HGB 11.8* 12.9 11.4*  HCT 36.3 38.0 35.3*  MCV 95.0  --  94.9  PLT 177  --  0000000   Basic Metabolic Panel: Recent Labs  Lab 10/14/22 1100 10/14/22 1118 10/14/22 1122 10/15/22 0028  NA 133*  --  135 135  K 4.9  --  4.4 3.4*  CL 93*  --  98 97*  CO2 21*  --   --  28  GLUCOSE 100*  --  121* 125*  BUN 36*  --  34* 33*  CREATININE 1.27*  --  1.30* 1.32*  CALCIUM 9.0  --   --  8.7*  MG  --  2.2  --   --    GFR: Estimated Creatinine Clearance: 32.4 mL/min (A) (by C-G formula based on SCr of 1.32 mg/dL (H)). Liver Function Tests: Recent Labs  Lab 10/14/22 1100 10/15/22 0028  AST 35 31  ALT 12 11  ALKPHOS 32* 28*  BILITOT 0.8 0.7  PROT 7.0 6.3*  ALBUMIN 3.8 3.2*   No results for input(s): "LIPASE", "AMYLASE" in the last 168 hours. No results for input(s): "AMMONIA" in the last 168 hours. Coagulation Profile: No results for input(s): "INR", "PROTIME" in the last 168 hours. Cardiac Enzymes: No results for input(s): "CKTOTAL", "CKMB", "CKMBINDEX", "TROPONINI" in the last 168 hours. BNP (last 3 results) No results for input(s): "PROBNP" in the last 8760 hours. HbA1C: No results for input(s): "HGBA1C" in the last 72 hours. CBG: No results for input(s): "GLUCAP" in the last 168 hours. Lipid Profile: No results for input(s): "CHOL", "HDL", "LDLCALC", "TRIG", "CHOLHDL", "LDLDIRECT" in the last 72 hours. Thyroid Function Tests: No results for input(s): "TSH", "T4TOTAL", "FREET4", "T3FREE", "THYROIDAB" in the last 72 hours. Anemia Panel: No results for input(s): "VITAMINB12", "FOLATE", "FERRITIN", "TIBC", "IRON", "RETICCTPCT" in the last 72 hours. Urine analysis:    Component Value Date/Time   COLORURINE YELLOW 01/27/2022 2143   APPEARANCEUR CLEAR 01/27/2022 2143   LABSPEC 1.013 01/27/2022 2143   PHURINE 5.0 01/27/2022 2143   GLUCOSEU NEGATIVE 01/27/2022 2143   HGBUR NEGATIVE  01/27/2022 2143   BILIRUBINUR NEGATIVE 01/27/2022 2143   [place] NEGATIVE 01/27/2022 2143   PROTEINUR NEGATIVE 01/27/2022 2143   NITRITE NEGATIVE 01/27/2022 2143   LEUKOCYTESUR NEGATIVE 01/27/2022 2143  Sepsis Labs: @LABRCNTIP (procalcitonin:4,lacticidven:4)  ) Recent Results (from the past 240 hour(s))  SARS Coronavirus 2 by RT PCR (hospital order, performed in [facility] hospital lab) *cepheid single result test* Anterior Nasal Swab     Status: None   Collection Time: 10/14/22  8:37 AM   Specimen: Anterior Nasal Swab  Result Value Ref Range Status   SARS Coronavirus 2 by RT PCR NEGATIVE NEGATIVE Final    Comment: Performed at [facility], [place] [address] [place], [place] [address]      Studies: ECHOCARDIOGRAM COMPLETE  Result Date: 10/15/2022    ECHOCARDIOGRAM REPORT   Patient Name:   CAITLINN VINLUAN [facility] Date of Exam: 10/15/2022 Medical Rec #:  [id number]         Height:       64.0 in Accession #:    [id number]        Weight:       151.7 lb Date of Birth:  February 12, 1941        BSA:          1.739 m Patient Age:    45 years          BP:           122/58 mmHg Patient Gender: F                 HR:           57 bpm. Exam Location:  Inpatient Procedure: 2D Echo, Cardiac Doppler and Color Doppler Indications:    Chest Pain R07.9  History:        Patient has prior history of Echocardiogram examinations, most                 recent 01/03/2022. CHF, COPD, Signs/Symptoms:Chest Pain; Risk                 Factors:Hypertension. CKD, stage 3.  Sonographer:    Ronny Flurry Referring Phys: [id number] [place]  1. Left ventricular ejection fraction, by estimation, is 60 to 65%. The left ventricle has normal function. The left ventricle has no regional wall motion abnormalities. There is mild concentric left ventricular hypertrophy. Left ventricular diastolic parameters are consistent with Grade I diastolic dysfunction (impaired relaxation).  2. Right ventricular systolic function  was not well visualized. The right ventricular size is normal. There is normal pulmonary artery systolic pressure. The estimated right ventricular systolic pressure is 7.6 mmHg.  3. Left atrial size was mildly dilated.  4. The mitral valve is normal in structure. Mild mitral valve regurgitation. No evidence of mitral stenosis.  5. The aortic valve is normal in structure. Aortic valve regurgitation is trivial. No aortic stenosis is present.  6. The inferior vena cava is normal in size with greater than 50% respiratory variability, suggesting right atrial pressure of 3 mmHg. FINDINGS  Left Ventricle: Left ventricular ejection fraction, by estimation, is 60 to 65%. The left ventricle has normal function. The left ventricle has no regional wall motion abnormalities. The left ventricular internal cavity size was normal in size. There is  mild concentric left ventricular hypertrophy. Abnormal (paradoxical) septal motion, consistent with left bundle branch block. Left ventricular diastolic function could not be evaluated due to abnormal septal motion. Left ventricular diastolic parameters  are consistent with Grade I diastolic dysfunction (impaired relaxation). Right Ventricle: The right ventricular size is normal. No increase in right ventricular wall thickness. Right ventricular systolic function was not well visualized. There is normal pulmonary artery  systolic pressure. The tricuspid regurgitant velocity is  1.07 m/s, and with an assumed right atrial pressure of 3 mmHg, the estimated right ventricular systolic pressure is 7.6 mmHg. Left Atrium: Left atrial size was mildly dilated. Right Atrium: Right atrial size was normal in size. Pericardium: There is no evidence of pericardial effusion. Mitral Valve: The mitral valve is normal in structure. Mild mitral annular calcification. Mild mitral valve regurgitation. No evidence of mitral valve stenosis. Tricuspid Valve: The tricuspid valve is normal in structure. Tricuspid  valve regurgitation is not demonstrated. No evidence of tricuspid stenosis. Aortic Valve: The aortic valve is normal in structure. Aortic valve regurgitation is trivial. No aortic stenosis is present. Aortic valve mean gradient measures 2.0 mmHg. Aortic valve peak gradient measures 4.3 mmHg. Aortic valve area, by VTI measures 3.42 cm. Pulmonic Valve: The pulmonic valve was normal in structure. Pulmonic valve regurgitation is trivial. No evidence of pulmonic stenosis. Aorta: The aortic root is normal in size and structure. Venous: The inferior vena cava is normal in size with greater than 50% respiratory variability, suggesting right atrial pressure of 3 mmHg. IAS/Shunts: No atrial level shunt detected by color flow Doppler.  LEFT VENTRICLE PLAX 2D LVIDd:         4.50 cm   Diastology LVIDs:         3.10 cm   LV e' medial:   2.37 cm/s LV PW:         1.30 cm   LV E/e' medial: 23.5 LV IVS:        1.34 cm LVOT diam:     2.20 cm LV SV:         80 LV SV Index:   46 LVOT Area:     3.80 cm  RIGHT VENTRICLE            IVC RV S prime:     8.45 cm/s  IVC diam: 1.55 cm LEFT ATRIUM           Index        RIGHT ATRIUM           Index LA diam:      5.10 cm 2.93 cm/m   RA Area:     15.60 cm LA Vol (A2C): 54.1 ml 31.10 ml/m  RA Volume:   34.10 ml  19.60 ml/m LA Vol (A4C): 61.4 ml 35.30 ml/m  AORTIC VALVE AV Area (Vmax):    3.52 cm AV Area (Vmean):   3.48 cm AV Area (VTI):     3.42 cm AV Vmax:           104.00 cm/s AV Vmean:          68.500 cm/s AV VTI:            0.233 m AV Peak Grad:      4.3 mmHg AV Mean Grad:      2.0 mmHg LVOT Vmax:         96.17 cm/s LVOT Vmean:        62.667 cm/s LVOT VTI:          0.209 m LVOT/AV VTI ratio: 0.90  AORTA Ao Root diam: 3.10 cm Ao Asc diam:  3.60 cm MITRAL VALVE                TRICUSPID VALVE MV Area (PHT): 2.52 cm     TR Peak grad:   4.6 mmHg MV Decel Time: 301 msec     TR Vmax:  107.00 cm/s MV E velocity: 55.70 cm/s MV A velocity: 117.00 cm/s  SHUNTS MV E/A ratio:  0.48          Systemic VTI:  0.21 m                             Systemic Diam: 2.20 cm Fransico Him MD Electronically signed by Fransico Him MD Signature Date/Time: 10/15/2022/2:45:26 PM    Final     Scheduled Meds:  atenolol  12.5 mg Oral BID   carbamazepine  400 mg Oral BID   enoxaparin (LOVENOX) injection  40 mg Subcutaneous Q24H   ezetimibe  10 mg Oral QHS   fenofibrate  160 mg Oral Daily   fluticasone furoate-vilanterol  1 puff Inhalation Daily   And   umeclidinium bromide  1 puff Inhalation Daily   pantoprazole  40 mg Oral Daily   prednisoLONE acetate  1 drop Left Eye QID   sodium chloride flush  3 mL Intravenous Q12H   tobramycin  1 drop Left Eye TID    Continuous Infusions:   LOS: 0 days     Kayleen Memos, MD [facility] Pager [phone]  If 7PM-7AM, please contact night-coverage [email or web address] Password [facility] 10/15/2022, 4:22 PM

## 2022-10-15 NOTE — Progress Notes (Signed)
Echocardiogram 2D Echocardiogram has been performed.  Ronny Flurry 10/15/2022, 11:58 AM

## 2022-10-15 NOTE — Evaluation (Addendum)
Physical Therapy Evaluation Patient Details Name: Brittany Irwin MRN: [id number] DOB: 10-31-1940 Today's Date: 10/15/2022  History of Present Illness  82 yo female presenting 3/27 with chest pain x2 hours, resolved by NG. Found to have elevated troponin and NSTEMI. PMH includes:  hypertension, chronic diastolic CHF, chronic disease, CKD 3A, chronic back pain, anemia, COPD, chronic respiratory failure with hypoxia, and anxiety.   Clinical Impression  Pt in bed upon arrival of PT, agreeable to evaluation at this time. Prior to admission the pt was ambulating with use of rollator in the home, reports independent with ADLs but that she does typically use sponge bath due to poor endurance. The pt now presents with limitations in functional mobility, strength, stability, and endurance due to above dx, and will continue to benefit from skilled PT to address these deficits. The pt's HR increased to 130s (max 137) after 15 ft bout of walking to bathroom with use of RW. No overt LOB, but pt with progressive fatigue, decreased stride length and clearance with fatigue. Pt also reports SOB, but SpO2 98% on 3L. Discussed need for increased mobility and endurance training to return to independence. Pt hopeful for return home but will benefit from continued skilled PT to work towards greater independence and endurance.         Recommendations for follow up therapy are one component of a multi-disciplinary discharge planning process, led by the attending physician.  Recommendations may be updated based on patient status, additional functional criteria and insurance authorization.  Follow Up Recommendations       Assistance Recommended at Discharge Frequent or constant Supervision/Assistance  Patient can return home with the following  A little help with walking and/or transfers;A little help with bathing/dressing/bathroom;Help with stairs or ramp for entrance    Equipment Recommendations None recommended  by PT  Recommendations for Other Services  OT consult    Functional Status Assessment Patient has had a recent decline in their functional status and demonstrates the ability to make significant improvements in function in a reasonable and predictable amount of time.     Precautions / Restrictions Precautions Precautions: Fall Precaution Comments: watch HR, on 3L O2 baseline Restrictions Weight Bearing Restrictions: No      Mobility  Bed Mobility Overal bed mobility: Needs Assistance             General bed mobility comments: sitting EOB upon arrival    Transfers Overall transfer level: Needs assistance Equipment used: Rolling walker (2 wheels) Transfers: Sit to/from Stand Sit to Stand: Min assist, Min guard           General transfer comment: minG when not fatigued, minA with fatigue. VC for hand placement    Ambulation/Gait Ambulation/Gait assistance: Min guard, Min assist Gait Distance (Feet): 15 Feet (x3) Assistive device: Rolling walker (2 wheels) Gait Pattern/deviations: Step-to pattern, Decreased stride length, Trunk flexed Gait velocity: decreased Gait velocity interpretation: <1.31 ft/sec, indicative of household ambulator   General Gait Details: progressively smaller steps and minimal clearance with fatigue. HR to 130s and pt SOB but SpO2 98% on 3L     Balance Overall balance assessment: Needs assistance Sitting-balance support: No upper extremity supported, Feet supported Sitting balance-Leahy Scale: Fair Sitting balance - Comments: fatigues quickly   Standing balance support: Bilateral upper extremity supported, During functional activity Standing balance-Leahy Scale: Fair Standing balance comment: dependent on BUE support  Pertinent Vitals/Pain Pain Assessment Pain Assessment: No/denies pain    Home Living Family/patient expects to be discharged to:: Private residence Living Arrangements:  Alone Available Help at Discharge: Family;Available PRN/intermittently Type of Home: Apartment Home Access: Level entry       Home Layout: One level Home Equipment: Conservation officer, nature (2 wheels);Rollator (4 wheels);Grab bars - tub/shower;Hand held shower head      Prior Function Prior Level of Function : Independent/Modified Independent             Mobility Comments: uses rollator and 3-4L O2 ADLs Comments: independent with ADLs, spongebathes,     Hand Dominance   Dominant Hand: Right    Extremity/Trunk Assessment   Upper Extremity Assessment Upper Extremity Assessment: Generalized weakness    Lower Extremity Assessment Lower Extremity Assessment: Generalized weakness    Cervical / Trunk Assessment Cervical / Trunk Assessment: Kyphotic  Communication   Communication: No difficulties  Cognition Arousal/Alertness: Awake/alert Behavior During Therapy: Anxious, WFL for tasks assessed/performed Overall Cognitive Status: Within Functional Limits for tasks assessed                                 General Comments: pt anxious about returning to baseline, able to explain sx, home set up, and assist needed well. responds logically to commands and instructions        General Comments General comments (skin integrity, edema, etc.): SpO2 stable on 3L, HR to max 137 with walking to bathroom    Exercises     Assessment/Plan    PT Assessment Patient needs continued PT services  PT Problem List Decreased activity tolerance;Decreased balance;Decreased mobility;Cardiopulmonary status limiting activity       PT Treatment Interventions DME instruction;Gait training;Stair training;Functional mobility training;Therapeutic activities;Therapeutic exercise;Balance training;Neuromuscular re-education    PT Goals (Current goals can be found in the Care Plan section)  Acute Rehab PT Goals Patient Stated Goal: return home and improve endurance PT Goal Formulation: With  patient Time For Goal Achievement: 10/29/22 Potential to Achieve Goals: Good    Frequency Min 1X/week        AM-PAC PT "6 Clicks" Mobility  Outcome Measure Help needed turning from your back to your side while in a flat bed without using bedrails?: None Help needed moving from lying on your back to sitting on the side of a flat bed without using bedrails?: None Help needed moving to and from a bed to a chair (including a wheelchair)?: A Little Help needed standing up from a chair using your arms (e.g., wheelchair or bedside chair)?: A Little Help needed to walk in hospital room?: A Lot Help needed climbing 3-5 steps with a railing? : A Lot 6 Click Score: 18    End of Session Equipment Utilized During Treatment: Gait belt;Oxygen Activity Tolerance: Patient tolerated treatment well Patient left: in chair;with call bell/phone within reach (bathroom with pull cord) Nurse Communication: Mobility status;Other (comment) (pt asking for xanax and) PT Visit Diagnosis: Muscle weakness (generalized) (M62.81);Unsteadiness on feet (R26.81)    Time: [id number] PT Time Calculation (min) (ACUTE ONLY): 37 min   Charges:   PT Evaluation $PT Eval Low Complexity: 1 Low PT Treatments $Gait Training: 8-22 mins        West Carbo, PT, DPT   Acute Rehabilitation Department Office [phone] Secure Chat Communication Preferred  Sandra Cockayne 10/15/2022, 2:06 PM

## 2022-10-16 DIAGNOSIS — R079 Chest pain, unspecified: Secondary | ICD-10-CM | POA: Diagnosis not present

## 2022-10-16 NOTE — Progress Notes (Signed)
PROGRESS NOTE  Brittany Irwin [id number] DOB: [birth year] DOA: 10/14/2022 PCP: Sarina Ser, MD  HPI/Recap of past 24 hours: Brittany Irwin is a 82 y.o. female with medical history significant of hypertension, chronic diastolic CHF, chronic disease, CKD 3A, chronic back pain, anemia, COPD, chronic respiratory failure with hypoxia, anxiety presenting with chest pain.  Workup revealed NSTEMI with uptrending troponin, peaked at 427 and down trended.  Seen by cardiology, suspects this is likely type II NSTEMI related to tachycardia yesterday.  10/16/2022: The patient was seen and examined at bedside.  Reports palpitations and tachycardia earlier.  Also with bradycardia this morning, Home atenolol held.  Does not feel ready to go home.  Assessment/Plan: Principal Problem:   Chest pain, rule out acute myocardial infarction Active Problems:   Essential hypertension   Chronic kidney disease, stage 3a (HCC)   Chronic anemia   Crohn disease (HCC)   Chronic back pain   Chronic diastolic (congestive) heart failure (HCC)   Myocardial infarction due to demand ischemia ([place])  Chest pain/suspected type II NSTEMI > Presented with substernal chest pain resolved with nitroglycerin. Workup revealed NSTEMI with uptrending troponin, peaked at 427 and down trended.  Seen by cardiology, suspects this is likely type II NSTEMI related to tachycardia yesterday. 2D echo done today.  It revealed LVEF 60 to 65% with no left ventricle wall motion abnormalities.   Wide-complex tachycardia Sinus bradycardia Home atenolol held due to bradycardia with heart rate in the 50's. > Cardiology following Management per cardiology.   Hypertension Chronic diastolic CHF > Last echo was in 2023 with EF 60 and 65%, G1 DD, normal RV function. > 2D echo done today.  It revealed LVEF 60 to 65% with no left ventricle wall motion abnormalities.   CKD 3B Appears to be at her baseline creatinine 1 point nephrotoxic  agents   Chronic back pain - Continue as needed Flexeril   Anemia of chronic disease > Hemoglobin stable at 11.8. - Trend CBC   Anxiety - Continue home as needed Xanax   Migraines - Continue home carbamazepine   COPD Chronic respiratory failure with hypoxia - Replace home Trelegy with formulary Breo and Incruse - Continue home as needed Xopenex   DVT prophylaxis:      Lovenox Code Status:              Full Family Communication:       Updated at bedside  Disposition Plan:              Patient is from:                        Home             Anticipated DC to:                   Home             Anticipated DC date:               1 to 2 days             Anticipated DC barriers:         None    Consults called:        Cardiology Admission status:     Inpatient status.  The patient requires at least 2 midnights for further evaluation and treatment of present condition.  Objective: Vitals:   10/16/22 0332 10/16/22 0732 10/16/22 1122  10/16/22 1703  BP: 112/71 (!) 141/62 121/67 117/67  Pulse: (!) 41 (!) 52 (!) 56 60  Resp: 18 18 19 19   Temp: 97.7 F (36.5 C) (!) 97.5 F (36.4 C) 97.6 F (36.4 C) 97.8 F (36.6 C)  TempSrc: Oral Oral Oral Oral  SpO2: 98% 95% 94% 96%  Weight: 68.8 kg     Height:        Intake/Output Summary (Last 24 hours) at 10/16/2022 1759 Last data filed at 10/16/2022 1250 Gross per 24 hour  Intake 640 ml  Output 1250 ml  Net -610 ml   Filed Weights   10/14/22 1700 10/15/22 0450 10/16/22 0332  Weight: 68.8 kg 68.8 kg 68.8 kg    Exam:  General: 82 y.o. year-old female frail-appearing no acute suspicious oriented x 3. Cardiovascular: Bradycardic with no rubs or gallops.   Respiratory: Clear to oscitation Abdomen: Soft nontender nondistended with normal bowel sounds x4 quadrants. Musculoskeletal: Normal extremity edema bilaterally.   Skin: No ulcerative lesions noted or rashes, Psychiatry: Mood is appropriate for condition and setting   Data  Reviewed: CBC: Recent Labs  Lab 10/14/22 0850 10/14/22 1122 10/15/22 0028  WBC 6.2  --  5.8  HGB 11.8* 12.9 11.4*  HCT 36.3 38.0 35.3*  MCV 95.0  --  94.9  PLT 177  --  0000000   Basic Metabolic Panel: Recent Labs  Lab 10/14/22 1100 10/14/22 1118 10/14/22 1122 10/15/22 0028  NA 133*  --  135 135  K 4.9  --  4.4 3.4*  CL 93*  --  98 97*  CO2 21*  --   --  28  GLUCOSE 100*  --  121* 125*  BUN 36*  --  34* 33*  CREATININE 1.27*  --  1.30* 1.32*  CALCIUM 9.0  --   --  8.7*  MG  --  2.2  --   --    GFR: Estimated Creatinine Clearance: 32.4 mL/min (A) (by C-G formula based on SCr of 1.32 mg/dL (H)). Liver Function Tests: Recent Labs  Lab 10/14/22 1100 10/15/22 0028  AST 35 31  ALT 12 11  ALKPHOS 32* 28*  BILITOT 0.8 0.7  PROT 7.0 6.3*  ALBUMIN 3.8 3.2*   No results for input(s): "LIPASE", "AMYLASE" in the last 168 hours. No results for input(s): "AMMONIA" in the last 168 hours. Coagulation Profile: No results for input(s): "INR", "PROTIME" in the last 168 hours. Cardiac Enzymes: No results for input(s): "CKTOTAL", "CKMB", "CKMBINDEX", "TROPONINI" in the last 168 hours. BNP (last 3 results) No results for input(s): "PROBNP" in the last 8760 hours. HbA1C: No results for input(s): "HGBA1C" in the last 72 hours. CBG: No results for input(s): "GLUCAP" in the last 168 hours. Lipid Profile: No results for input(s): "CHOL", "HDL", "LDLCALC", "TRIG", "CHOLHDL", "LDLDIRECT" in the last 72 hours. Thyroid Function Tests: No results for input(s): "TSH", "T4TOTAL", "FREET4", "T3FREE", "THYROIDAB" in the last 72 hours. Anemia Panel: No results for input(s): "VITAMINB12", "FOLATE", "FERRITIN", "TIBC", "IRON", "RETICCTPCT" in the last 72 hours. Urine analysis:    Component Value Date/Time   COLORURINE YELLOW 01/27/2022 2143   APPEARANCEUR CLEAR 01/27/2022 2143   LABSPEC 1.013 01/27/2022 2143   PHURINE 5.0 01/27/2022 2143   GLUCOSEU NEGATIVE 01/27/2022 2143   HGBUR NEGATIVE  01/27/2022 2143   BILIRUBINUR NEGATIVE 01/27/2022 2143   [place] NEGATIVE 01/27/2022 2143   PROTEINUR NEGATIVE 01/27/2022 2143   NITRITE NEGATIVE 01/27/2022 2143   LEUKOCYTESUR NEGATIVE 01/27/2022 2143   Sepsis Labs: @LABRCNTIP (procalcitonin:4,lacticidven:4)  )  Recent Results (from the past 240 hour(s))  SARS Coronavirus 2 by RT PCR (hospital order, performed in [facility] hospital lab) *cepheid single result test* Anterior Nasal Swab     Status: None   Collection Time: 10/14/22  8:37 AM   Specimen: Anterior Nasal Swab  Result Value Ref Range Status   SARS Coronavirus 2 by RT PCR NEGATIVE NEGATIVE Final    Comment: Performed at [facility], [place] [address] [place], [place] [address]      Studies: No results found.  Scheduled Meds:  atenolol  12.5 mg Oral BID   carbamazepine  400 mg Oral BID   enoxaparin (LOVENOX) injection  40 mg Subcutaneous Q24H   ezetimibe  10 mg Oral QHS   fenofibrate  160 mg Oral Daily   fluticasone furoate-vilanterol  1 puff Inhalation Daily   And   umeclidinium bromide  1 puff Inhalation Daily   pantoprazole  40 mg Oral Daily   prednisoLONE acetate  1 drop Left Eye QID   sodium chloride flush  3 mL Intravenous Q12H   tobramycin  1 drop Left Eye TID    Continuous Infusions:   LOS: 1 day     Kayleen Memos, MD [facility] Pager [phone]  If 7PM-7AM, please contact night-coverage [email or web address] Password [facility] 10/16/2022, 5:59 PM

## 2022-10-16 NOTE — Progress Notes (Signed)
Physical Therapy Treatment Patient Details Name: Brittany Irwin MRN: [id number] DOB: 06-21-1941 Today's Date: 10/16/2022   History of Present Illness 82 yo female presenting 3/27 with chest pain x2 hours, resolved by NG. Found to have elevated troponin and NSTEMI. PMH includes:  hypertension, chronic diastolic CHF, chronic disease, CKD 3A, chronic back pain, anemia, COPD, chronic respiratory failure with hypoxia, and anxiety.    PT Comments    Pt making good progress with incr activity tolerance today and able to amb in hallway. Did better using rollator which she has at home. Feel she will be able to manage at home with the intermittent assist of sister.    Recommendations for follow up therapy are one component of a multi-disciplinary discharge planning process, led by the attending physician.  Recommendations may be updated based on patient status, additional functional criteria and insurance authorization.  Follow Up Recommendations       Assistance Recommended at Discharge Intermittent Supervision/Assistance  Patient can return home with the following A little help with bathing/dressing/bathroom;Help with stairs or ramp for entrance   Equipment Recommendations  None recommended by PT    Recommendations for Other Services OT consult     Precautions / Restrictions Precautions Precautions: Fall Precaution Comments: watch HR, on 3L O2 baseline Restrictions Weight Bearing Restrictions: No     Mobility  Bed Mobility               General bed mobility comments: sitting EOB at end of session    Transfers Overall transfer level: Needs assistance Equipment used: Rollator (4 wheels) Transfers: Sit to/from Stand Sit to Stand: Supervision                Ambulation/Gait Ambulation/Gait assistance: Supervision Gait Distance (Feet): 350 Feet Assistive device: Rollator (4 wheels) Gait Pattern/deviations: Decreased stride length, Trunk flexed, Step-through  pattern Gait velocity: decr Gait velocity interpretation: 1.31 - 2.62 ft/sec, indicative of limited community ambulator   General Gait Details: supervision for safety/lines   Stairs             Wheelchair Mobility    Modified Rankin (Stroke Patients Only)       Balance Overall balance assessment: Needs assistance Sitting-balance support: No upper extremity supported, Feet supported Sitting balance-Leahy Scale: Good     Standing balance support: During functional activity, No upper extremity supported Standing balance-Leahy Scale: Fair                              Cognition Arousal/Alertness: Awake/alert Behavior During Therapy: Anxious, WFL for tasks assessed/performed Overall Cognitive Status: Within Functional Limits for tasks assessed                                          Exercises      General Comments General comments (skin integrity, edema, etc.): Pt on 3L O2. HR 70-80's      Pertinent Vitals/Pain      Home Living     Available Help at Discharge: Available PRN/intermittently                    Prior Function            PT Goals (current goals can now be found in the care plan section) Acute Rehab PT Goals Patient Stated Goal: return home and improve endurance Progress  towards PT goals: Progressing toward goals    Frequency    Min 1X/week      PT Plan Current plan remains appropriate    Co-evaluation              AM-PAC PT "6 Clicks" Mobility   Outcome Measure  Help needed turning from your back to your side while in a flat bed without using bedrails?: None Help needed moving from lying on your back to sitting on the side of a flat bed without using bedrails?: None Help needed moving to and from a bed to a chair (including a wheelchair)?: A Little Help needed standing up from a chair using your arms (e.g., wheelchair or bedside chair)?: A Little Help needed to walk in hospital room?: A  Little Help needed climbing 3-5 steps with a railing? : A Lot 6 Click Score: 19    End of Session Equipment Utilized During Treatment: Oxygen Activity Tolerance: Patient tolerated treatment well Patient left: with call bell/phone within reach;in bed (bathroom with pull cord)   PT Visit Diagnosis: Muscle weakness (generalized) (M62.81);Unsteadiness on feet (R26.81)     Time: [id number] PT Time Calculation (min) (ACUTE ONLY): 19 min  Charges:  $Gait Training: 8-22 mins                     [place] [facility] [place] 10/16/2022, 2:21 PM

## 2022-10-16 NOTE — Evaluation (Signed)
Occupational Therapy Evaluation Patient Details Name: Brittany Irwin MRN: [id number] DOB: 1940-11-12 Today's Date: 10/16/2022   History of Present Illness 82 yo female presenting 3/27 with chest pain x2 hours, resolved by NG. Found to have elevated troponin and NSTEMI. PMH includes:  hypertension, chronic diastolic CHF, chronic disease, CKD 3A, chronic back pain, anemia, COPD, chronic respiratory failure with hypoxia, and anxiety.   Clinical Impression   Pt admitted for above diagnosis. PTA patient lived alone and is normally on 3-4L 02, ambulates with a rollator at baseline. Patient currently completing functional transfers and ambulation with Min guard assist while maintaining Sp02. Patient currently presenting with decreased activity tolerance/endurance and her BLEs fatigue quickly but did not need a rest break for hall level ambulation. Pt would benefit from continued acute skilled OT services to address above deficits and help transition to next level of care. Pt would benefit from post acute Home OT services to help maximize functional independence.       Recommendations for follow up therapy are one component of a multi-disciplinary discharge planning process, led by the attending physician.  Recommendations may be updated based on patient status, additional functional criteria and insurance authorization.   Assistance Recommended at Discharge Intermittent Supervision/Assistance  Patient can return home with the following A little help with walking and/or transfers;Assistance with cooking/housework;Assist for transportation;Help with stairs or ramp for entrance;A little help with bathing/dressing/bathroom    Functional Status Assessment  Patient has had a recent decline in their functional status and demonstrates the ability to make significant improvements in function in a reasonable and predictable amount of time.  Equipment Recommendations  Tub/shower seat    Recommendations for  Other Services       Precautions / Restrictions Precautions Precautions: Fall Precaution Comments: watch HR, on 3L O2 baseline Restrictions Weight Bearing Restrictions: No      Mobility Bed Mobility Overal bed mobility: Needs Assistance Bed Mobility: Supine to Sit     Supine to sit: Supervision     General bed mobility comments: Pt left sitting in chair    Transfers Overall transfer level: Needs assistance Equipment used: Rolling walker (2 wheels) Transfers: Sit to/from Stand, Bed to chair/wheelchair/BSC Sit to Stand: Min guard           General transfer comment: Completed stand step t/f with close supervision      Balance Overall balance assessment: Needs assistance Sitting-balance support: No upper extremity supported, Feet supported Sitting balance-Leahy Scale: Good     Standing balance support: Bilateral upper extremity supported, During functional activity Standing balance-Leahy Scale: Fair Standing balance comment: Was able to complete chair t/f taking side steps with min guard no UE supported.                           ADL either performed or assessed with clinical judgement   ADL Overall ADL's : Needs assistance/impaired Eating/Feeding: Independent   Grooming: Standing;Min guard   Upper Body Bathing: Min guard;Sitting   Lower Body Bathing: Min guard;Sitting/lateral leans   Upper Body Dressing : Sitting;Supervision/safety Upper Body Dressing Details (indicate cue type and reason): Pt donned gown like a jacket sitting EOB Lower Body Dressing: Supervision/safety;Sitting/lateral leans Lower Body Dressing Details (indicate cue type and reason): Pt doffed/donned L sock using figure four technique sitting EOB Toilet Transfer: Min guard;Rolling walker (2 wheels);Ambulation   Toileting- Clothing Manipulation and Hygiene: Sitting/lateral lean;Min guard   Tub/ Banker: Min guard;Ambulation;Rolling walker (2 wheels)  Functional  mobility during ADLs: Min guard;Rolling walker (2 wheels)       Vision         Perception     Praxis      Pertinent Vitals/Pain Pain Assessment Pain Assessment: No/denies pain     Hand Dominance Right   Extremity/Trunk Assessment Upper Extremity Assessment Upper Extremity Assessment: Generalized weakness   Lower Extremity Assessment Lower Extremity Assessment: Generalized weakness   Cervical / Trunk Assessment Cervical / Trunk Assessment: Kyphotic   Communication Communication Communication: No difficulties   Cognition Arousal/Alertness: Awake/alert Behavior During Therapy: WFL for tasks assessed/performed Overall Cognitive Status: Within Functional Limits for tasks assessed                                       General Comments  Sp02 stable on  3L    Exercises     Shoulder Instructions      Home Living Family/patient expects to be discharged to:: Private residence Living Arrangements: Alone Available Help at Discharge: Available PRN/intermittently Type of Home: Apartment Home Access: Level entry     Home Layout: One level     Bathroom Shower/Tub: Teacher, early years/pre: Standard     Home Equipment: Conservation officer, nature (2 wheels);Rollator (4 wheels);Grab bars - tub/shower;Hand held shower head          Prior Functioning/Environment Prior Level of Function : Independent/Modified Independent             Mobility Comments: Used rollator and 3-4L 02 at home. ADLs Comments: independent with ADLs, spongebathes occasionally.        OT Problem List: Impaired balance (sitting and/or standing);Cardiopulmonary status limiting activity;Decreased activity tolerance      OT Treatment/Interventions: Self-care/ADL training;Therapeutic exercise;Therapeutic activities;Energy conservation;DME and/or AE instruction;Patient/family education;Balance training    OT Goals(Current goals can be found in the care plan section) Acute  Rehab OT Goals Patient Stated Goal: Go home OT Goal Formulation: With patient Time For Goal Achievement: 10/30/22 Potential to Achieve Goals: Good ADL Goals Pt Will Perform Grooming: with supervision;standing (standing at sink > 3 mins) Pt Will Transfer to Toilet: with supervision;ambulating Additional ADL Goal #1: Patient will recall 4 CHF signs/preacautions and identify which ones are an emergency situation  OT Frequency: Min 2X/week    Co-evaluation              AM-PAC OT "6 Clicks" Daily Activity     Outcome Measure Help from another person eating meals?: None Help from another person taking care of personal grooming?: A Little Help from another person toileting, which includes using toliet, bedpan, or urinal?: A Little Help from another person bathing (including washing, rinsing, drying)?: A Little Help from another person to put on and taking off regular upper body clothing?: A Little Help from another person to put on and taking off regular lower body clothing?: A Little 6 Click Score: 19   End of Session Equipment Utilized During Treatment: Gait belt;Rolling walker (2 wheels) Nurse Communication: Mobility status  Activity Tolerance: Patient tolerated treatment well Patient left: in bed;with chair alarm set  OT Visit Diagnosis: Unsteadiness on feet (R26.81);Muscle weakness (generalized) (M62.81)                Time: [id number] OT Time Calculation (min): 40 min Charges:  OT General Charges $OT Visit: 1 Visit OT Evaluation $OT Eval Moderate Complexity: 1 Mod OT Treatments $Therapeutic Activity:  23-37 mins  10/16/2022  AB, OTR/L  Acute Rehabilitation Services  Office: [place] 10/16/2022, 11:16 AM

## 2022-10-17 DIAGNOSIS — R42 Dizziness and giddiness: Secondary | ICD-10-CM

## 2022-10-17 DIAGNOSIS — R000 Tachycardia, unspecified: Secondary | ICD-10-CM

## 2022-10-17 DIAGNOSIS — R079 Chest pain, unspecified: Secondary | ICD-10-CM | POA: Diagnosis not present

## 2022-10-17 DIAGNOSIS — I5A Non-ischemic myocardial injury (non-traumatic): Secondary | ICD-10-CM

## 2022-10-17 LAB — BASIC METABOLIC PANEL
Anion gap: 11 (ref 5–15)
BUN: 26 mg/dL — ABNORMAL HIGH (ref 8–23)
CO2: 23 mmol/L (ref 22–32)
Calcium: 8.8 mg/dL — ABNORMAL LOW (ref 8.9–10.3)
Chloride: 104 mmol/L (ref 98–111)
Creatinine, Ser: 1.07 mg/dL — ABNORMAL HIGH (ref 0.44–1.00)
GFR, Estimated: 53 mL/min — ABNORMAL LOW (ref >60)
Glucose, Bld: 98 mg/dL (ref 70–99)
Potassium: 4.4 mmol/L (ref 3.5–5.1)
Sodium: 138 mmol/L (ref 135–145)

## 2022-10-17 LAB — CBC
HCT: 36.6 % (ref 36.0–46.0)
Hemoglobin: 12.3 g/dL (ref 12.0–15.0)
MCH: 30.9 pg (ref 26.0–34.0)
MCHC: 33.6 g/dL (ref 30.0–36.0)
MCV: 92 fL (ref 80.0–100.0)
Platelets: 266 10*3/uL (ref 150–400)
RBC: 3.98 MIL/uL (ref 3.87–5.11)
RDW: 12.9 % (ref 11.5–15.5)
WBC: 5.2 10*3/uL (ref 4.0–10.5)
nRBC: 0 % (ref 0.0–0.2)

## 2022-10-17 LAB — PHOSPHORUS: Phosphorus: 3.2 mg/dL (ref 2.5–4.6)

## 2022-10-17 LAB — MAGNESIUM: Magnesium: 2.3 mg/dL (ref 1.7–2.4)

## 2022-10-17 MED ORDER — DILTIAZEM HCL 30 MG PO TABS: 30.0000 mg | ORAL_TABLET | Freq: Once | ORAL | 0 refills | Status: DC | PRN

## 2022-10-17 MED ORDER — DILTIAZEM HCL 30 MG PO TABS: 30.0000 mg | ORAL_TABLET | Freq: Once | ORAL | Status: DC | PRN

## 2022-10-17 NOTE — Progress Notes (Signed)
Approximately 1515-- Pt discharged to home. AVS discharge instructions provided to pt with teach-back method utilized. At time of discharge, pt A&O x 4 and VSS. No complaints of pain at this time. All PIVs removed. All pt belongings taken with pt--pt verified. Pt transported home with sister in personal car. Pt discharged on 3L O2 [place]. Telemetry notified.

## 2022-10-17 NOTE — Plan of Care (Signed)

## 2022-10-17 NOTE — TOC Transition Note (Signed)
Transition of Care [facility]) - CM/SW Discharge Note   Patient Details  Name: Brittany Irwin MRN: [id number] Date of Birth: Sep 23, 1940  [facility] CM/SW Contact:  Carles Collet, RN Phone Number: 10/17/2022, 11:26 AM   Clinical Narrative:     [name] today. No other TOC needs identified.   Final next level of care: Home w Home Health Services Barriers to Discharge: Continued Medical Work up   Patient Goals and CMS Choice CMS Medicare.gov Compare Post Acute Care list provided to:: Patient Choice offered to / list presented to : Patient  Discharge Placement                         Discharge Plan and Services Additional resources added to the After Visit Summary for   In-house Referral: NA Discharge Planning Services: CM Consult Post Acute Care Choice: Home Health          DME Arranged: N/A DME Agency: NA       HH Arranged: RN, Disease Management, PT [place] Agency: [place] Date [name] Agency Contacted: 10/17/22 Time [place]: [id number] Representative spoke with at [place]: Cindie  Social Determinants of Health ([place]) Interventions SDOH Screenings   Food Insecurity: No Food Insecurity (10/14/2022)  Housing: Low Risk  (10/14/2022)  Transportation Needs: No Transportation Needs (10/14/2022)  Utilities: Not At Risk (10/14/2022)  Tobacco Use: Medium Risk (10/14/2022)     Readmission Risk Interventions     No data to display

## 2022-10-17 NOTE — Discharge Summary (Signed)
Discharge Summary  Brittany Irwin [id number] DOB: August 23, 1940  PCP: Sarina Ser, MD  Admit date: 10/14/2022 Discharge date: 10/17/2022  Time spent: 35 minutes.  Recommendations for Outpatient Follow-up:  Follow-up with cardiology in 1 to 2 weeks. Follow-up with your primary care provider. Take your medications as prescribed.  Discharge Diagnoses:  Active Hospital Problems   Diagnosis Date Noted   Chest pain, rule out acute myocardial infarction 10/14/2022   Essential hypertension 01/03/2022    Priority: 2.   Chronic kidney disease, stage 3a ([place]) 01/03/2022    Priority: 3.   Chronic anemia 01/03/2022    Priority: 4.   Crohn disease ([place]) 01/03/2022    Priority: 6.   Chronic back pain 01/03/2022    Priority: 7.   Myocardial infarction due to demand ischemia ([place]) 10/15/2022   Chronic diastolic (congestive) heart failure ([place]) 10/04/2013    Resolved Hospital Problems  No resolved problems to display.    Discharge Condition: Stable  Diet recommendation: Resume previous diet.  Vitals:   10/17/22 0734 10/17/22 0741  BP: 132/60   Pulse: (!) 50 (!) 54  Resp: 18 16  Temp: 97.6 F (36.4 C)   SpO2: 98% 98%    History of present illness:  Brittany Irwin is a 82 y.o. female with medical history significant of hypertension, chronic diastolic CHF, CKD 3A, chronic back pain, anemia, COPD, chronic respiratory failure with hypoxia on 3 L nasal cannula at baseline, anxiety presenting with chest pain.  Workup revealed NSTEMI.  Seen by cardiology, suspects this is likely type II NSTEMI, related to tachycardia.   10/17/2022: The patient was seen at her bedside.  There were no acute events overnight.  She feels a lot better today.  She also no new complaints.  Hospital Course:  Principal Problem:   Chest pain, rule out acute myocardial infarction Active Problems:   Essential hypertension   Chronic kidney disease, stage 3a (HCC)   Chronic anemia   Crohn disease  (HCC)   Chronic back pain   Chronic diastolic (congestive) heart failure (HCC)   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)  Acute myocardial injury with no evidence of myocardial infarction, likely secondary to demand ischemia. The patient underwent L AC in 2018 and 2021 and no found that showed nonobstructive coronary artery disease.  No indication for any noninvasive or invasive ischemia evaluation at this time, per cardiology. 2D echo done 10/16/2022.  It revealed LVEF 60 to 65% with no left ventricle wall motion abnormalities. Chest pain has resolved. Follow-up with cardiology outpatient.   Wide-complex tachycardia Dizziness, sinus bradycardia Home atenolol held due to bradycardia with heart rate in the 50's. Cardiology recommended p.o. Cardizem 30 mg as needed for tachycardia. 2-week event monitor at discharge.  Dizziness could be from low blood pressures.  Home losartan discontinued.   Hypertension Chronic diastolic CHF > Last echo was in 2023 with EF 60 and 65%, G1 DD, normal RV function. > 2D echo done 10/16/2022 revealed LVEF 60 to 65% with no left ventricle wall motion abnormalities.   CKD 3A Currently at her baseline creatinine 1.07 with GFR 53. Continue to avoid nephrotoxic agents.   Chronic back pain - Continue as needed Flexeril   Resolved anemia of chronic disease Hemoglobin is 12.3 No reported bleeding   Chronic anxiety She is on benzodiazepine prior to admission Follow-up with your primary care provider regarding your medications.   Migraines Resume home carbamazepine   COPD Chronic respiratory failure with hypoxia on 3 L  nasal cannula at baseline No acute issues. O2 saturation 98% on 3 L    Code Status:              Full  Consults called:        Cardiology   Procedures: 2D echo on 10/16/2022.   Discharge Exam: BP 132/60 (BP Location: Left Arm)   Pulse (!) 54   Temp 97.6 F (36.4 C) (Oral)   Resp 16   Ht 5\' 4"  (1.626 m)   Wt 68.8 kg    SpO2 98%   BMI 26.04 kg/m  General: 82 y.o. year-old female well developed well nourished in no acute distress.  Alert and oriented x3. Cardiovascular: Regular rate and rhythm with no rubs or gallops.  No thyromegaly or JVD noted.   Respiratory: Clear to auscultation with no wheezes or rales. Good inspiratory effort. Abdomen: Soft nontender nondistended with normal bowel sounds x4 quadrants. Musculoskeletal: No lower extremity edema. 2/4 pulses in all 4 extremities. Skin: No ulcerative lesions noted or rashes, Psychiatry: Mood is appropriate for condition and setting  Discharge Instructions You were cared for by a hospitalist during your hospital stay. If you have any questions about your discharge medications or the care you received while you were in the hospital after you are discharged, you can call the unit and asked to speak with the hospitalist on call if the hospitalist that took care of you is not available. Once you are discharged, your primary care physician will handle any further medical issues. Please note that NO REFILLS for any discharge medications will be authorized once you are discharged, as it is imperative that you return to your primary care physician (or establish a relationship with a primary care physician if you do not have one) for your aftercare needs so that they can reassess your need for medications and monitor your lab values.   Allergies as of 10/17/2022       Reactions   Azulfidine [sulfasalazine] Other (See Comments)   Headache    Epipen [epinephrine] Hypertension   Flagyl [metronidazole] Hives   Motrin [ibuprofen] Nausea Only, Other (See Comments)   Told not to take medication due to GI distress caused by crohn's disease.   Nsaids Other (See Comments)   Told not to take NSAIDs due to GI distress caused by crohn's disease   Penicillins Hives   Morphine And Related Other (See Comments)   Told not to take due to GI distress caused by crohn's disease         Medication List     STOP taking these medications    atenolol 25 MG tablet Commonly known as: TENORMIN   losartan 25 MG tablet Commonly known as: COZAAR       TAKE these medications    acetaminophen-codeine 300-60 MG tablet Commonly known as: TYLENOL #4 Take 1 tablet by mouth 4 (four) times daily as needed for pain.   ALPRAZolam 1 MG tablet Commonly known as: XANAX Take 1 mg by mouth 3 (three) times daily as needed for anxiety.   AYR SALINE NASAL GEL NA Place 1 Application into the nose 2 (two) times daily as needed (Dry nose).   carbamazepine 200 MG 12 hr capsule Commonly known as: CARBATROL Take 400 mg by mouth 2 (two) times daily.   cyanocobalamin 1000 MCG/ML injection Commonly known as: VITAMIN B12 Inject 1,000 mcg into the muscle every 30 (thirty) days.   cyclobenzaprine 5 MG tablet Commonly known as: FLEXERIL Take 5 mg by  mouth 3 (three) times daily as needed for muscle spasms.   diltiazem 30 MG tablet Commonly known as: CARDIZEM Take 1 tablet (30 mg total) by mouth once as needed for up to 14 days (for tachycardia with HR sustained 120 or more).   ergocalciferol 1.25 MG (50000 UT) capsule Commonly known as: VITAMIN D2 Take 50,000 Units by mouth every Friday.   ezetimibe 10 MG tablet Commonly known as: ZETIA Take 10 mg by mouth at bedtime.   fenofibrate micronized 200 MG capsule Commonly known as: LOFIBRA Take 200 mg by mouth daily.   furosemide 20 MG tablet Commonly known as: LASIX Take 60 mg by mouth in the morning and at bedtime.   gabapentin 100 MG capsule Commonly known as: NEURONTIN Take 100 mg by mouth daily.   ICY HOT BACK EX Apply 1 Application topically 2 (two) times daily as needed (back pain).   levalbuterol 45 MCG/ACT inhaler Commonly known as: XOPENEX HFA Inhale 2 puffs into the lungs every 6 (six) hours as needed for shortness of breath or wheezing.   metolazone 5 MG tablet Commonly known as: ZAROXOLYN Take 1  tablet (5 mg total) by mouth daily as needed (edema). Please take as needed in case of weight gain 3 lbs in 24 to 48 hrs or 5 lbs in 7 days.   nitroGLYCERIN 0.4 MG SL tablet Commonly known as: NITROSTAT Place 0.4 mg under the tongue every 5 (five) minutes x 3 doses as needed for chest pain.   pantoprazole 40 MG tablet Commonly known as: PROTONIX Take 40 mg by mouth daily.   potassium chloride SA 20 MEQ tablet Commonly known as: KLOR-CON M Take 20 mEq by mouth daily.   prednisoLONE acetate 1 % ophthalmic suspension Commonly known as: PRED FORTE Place 1 drop into the left eye 4 (four) times daily.   tobramycin 0.3 % ophthalmic solution Commonly known as: TOBREX Place 1 drop into the left eye in the morning, at noon, in the evening, and at bedtime.   Trelegy Ellipta 200-62.5-25 MCG/ACT Aepb Generic drug: Fluticasone-Umeclidin-Vilant Inhale 1 puff into the lungs daily.   Voltaren Arthritis Pain 1 % Gel Generic drug: diclofenac Sodium Apply 1 Application topically 2 (two) times daily as needed (back pain).       Allergies  Allergen Reactions   Azulfidine [Sulfasalazine] Other (See Comments)    Headache    Epipen [Epinephrine] Hypertension   Flagyl [Metronidazole] Hives   Motrin [Ibuprofen] Nausea Only and Other (See Comments)    Told not to take medication due to GI distress caused by crohn's disease.   Nsaids Other (See Comments)    Told not to take NSAIDs due to GI distress caused by crohn's disease   Penicillins Hives   Morphine And Related Other (See Comments)    Told not to take due to GI distress caused by crohn's disease    Follow-up Information     [facility] Follow up.   Specialty: [place] Why: Agency will call you to set up apt times Contact information: [place] [address] [phone]         Sarina Ser, MD. Call today.   Specialty: Internal Medicine Why: Please call for a posthospital  follow-up appointment. Contact information: [place] [address] [phone]         Lorretta Harp, MD. Call today.   Specialties: Cardiology, Radiology Why: Please call for a posthospital follow-up appointment. Contact information:  [address] [place]  [address] [phone]                  The results of significant diagnostics from this hospitalization (including imaging, microbiology, ancillary and laboratory) are listed below for reference.    Significant Diagnostic Studies: ECHOCARDIOGRAM COMPLETE  Result Date: 10/15/2022    ECHOCARDIOGRAM REPORT   Patient Name:   DANIA GADEN [facility] Date of Exam: 10/15/2022 Medical Rec #:  [id number]         Height:       64.0 in Accession #:    [id number]        Weight:       151.7 lb Date of Birth:  1940/10/28        BSA:          1.739 m Patient Age:    49 years          BP:           122/58 mmHg Patient Gender: F                 HR:           57 bpm. Exam Location:  Inpatient Procedure: 2D Echo, Cardiac Doppler and Color Doppler Indications:    Chest Pain R07.9  History:        Patient has prior history of Echocardiogram examinations, most                 recent 01/03/2022. CHF, COPD, Signs/Symptoms:Chest Pain; Risk                 Factors:Hypertension. CKD, stage 3.  Sonographer:    Ronny Flurry Referring Phys: [id number] [place]  1. Left ventricular ejection fraction, by estimation, is 60 to 65%. The left ventricle has normal function. The left ventricle has no regional wall motion abnormalities. There is mild concentric left ventricular hypertrophy. Left ventricular diastolic parameters are consistent with Grade I diastolic dysfunction (impaired relaxation).  2. Right ventricular systolic function was not well visualized. The right ventricular size is normal. There is normal pulmonary artery systolic pressure. The estimated right ventricular systolic pressure is 7.6 mmHg.   3. Left atrial size was mildly dilated.  4. The mitral valve is normal in structure. Mild mitral valve regurgitation. No evidence of mitral stenosis.  5. The aortic valve is normal in structure. Aortic valve regurgitation is trivial. No aortic stenosis is present.  6. The inferior vena cava is normal in size with greater than 50% respiratory variability, suggesting right atrial pressure of 3 mmHg. FINDINGS  Left Ventricle: Left ventricular ejection fraction, by estimation, is 60 to 65%. The left ventricle has normal function. The left ventricle has no regional wall motion abnormalities. The left ventricular internal cavity size was normal in size. There is  mild concentric left ventricular hypertrophy. Abnormal (paradoxical) septal motion, consistent with left bundle branch block. Left ventricular diastolic function could not be evaluated due to abnormal septal motion. Left ventricular diastolic parameters  are consistent with Grade I diastolic dysfunction (impaired relaxation). Right Ventricle: The right ventricular size is normal. No increase in right ventricular wall thickness. Right ventricular systolic function was not well visualized. There is normal pulmonary artery systolic pressure. The tricuspid regurgitant velocity is  1.07 m/s, and with an assumed right atrial pressure of 3 mmHg, the estimated right ventricular systolic pressure is 7.6 mmHg. Left Atrium: Left atrial size was mildly dilated. Right Atrium: Right atrial size was  normal in size. Pericardium: There is no evidence of pericardial effusion. Mitral Valve: The mitral valve is normal in structure. Mild mitral annular calcification. Mild mitral valve regurgitation. No evidence of mitral valve stenosis. Tricuspid Valve: The tricuspid valve is normal in structure. Tricuspid valve regurgitation is not demonstrated. No evidence of tricuspid stenosis. Aortic Valve: The aortic valve is normal in structure. Aortic valve regurgitation is trivial. No aortic  stenosis is present. Aortic valve mean gradient measures 2.0 mmHg. Aortic valve peak gradient measures 4.3 mmHg. Aortic valve area, by VTI measures 3.42 cm. Pulmonic Valve: The pulmonic valve was normal in structure. Pulmonic valve regurgitation is trivial. No evidence of pulmonic stenosis. Aorta: The aortic root is normal in size and structure. Venous: The inferior vena cava is normal in size with greater than 50% respiratory variability, suggesting right atrial pressure of 3 mmHg. IAS/Shunts: No atrial level shunt detected by color flow Doppler.  LEFT VENTRICLE PLAX 2D LVIDd:         4.50 cm   Diastology LVIDs:         3.10 cm   LV e' medial:   2.37 cm/s LV PW:         1.30 cm   LV E/e' medial: 23.5 LV IVS:        1.34 cm LVOT diam:     2.20 cm LV SV:         80 LV SV Index:   46 LVOT Area:     3.80 cm  RIGHT VENTRICLE            IVC RV S prime:     8.45 cm/s  IVC diam: 1.55 cm LEFT ATRIUM           Index        RIGHT ATRIUM           Index LA diam:      5.10 cm 2.93 cm/m   RA Area:     15.60 cm LA Vol (A2C): 54.1 ml 31.10 ml/m  RA Volume:   34.10 ml  19.60 ml/m LA Vol (A4C): 61.4 ml 35.30 ml/m  AORTIC VALVE AV Area (Vmax):    3.52 cm AV Area (Vmean):   3.48 cm AV Area (VTI):     3.42 cm AV Vmax:           104.00 cm/s AV Vmean:          68.500 cm/s AV VTI:            0.233 m AV Peak Grad:      4.3 mmHg AV Mean Grad:      2.0 mmHg LVOT Vmax:         96.17 cm/s LVOT Vmean:        62.667 cm/s LVOT VTI:          0.209 m LVOT/AV VTI ratio: 0.90  AORTA Ao Root diam: 3.10 cm Ao Asc diam:  3.60 cm MITRAL VALVE                TRICUSPID VALVE MV Area (PHT): 2.52 cm     TR Peak grad:   4.6 mmHg MV Decel Time: 301 msec     TR Vmax:        107.00 cm/s MV E velocity: 55.70 cm/s MV A velocity: 117.00 cm/s  SHUNTS MV E/A ratio:  0.48         Systemic VTI:  0.21 m  Systemic Diam: 2.20 cm Fransico Him MD Electronically signed by Fransico Him MD Signature Date/Time: 10/15/2022/2:45:26 PM    Final     DG Chest Port 1 View  Result Date: 10/14/2022 CLINICAL DATA:  Chest pain, cough. EXAM: PORTABLE CHEST 1 VIEW COMPARISON:  01/27/2022. FINDINGS: Low lung volumes accentuate the pulmonary vasculature and cardiomediastinal silhouette. Unchanged scarring at the right-greater-than-left lung bases with associated volume loss. No new airspace opacity. Stable cardiac and mediastinal contours. No pneumothorax or pleural effusion. IMPRESSION: Unchanged scarring at the right-greater-than-left lung bases with associated volume loss. No new airspace opacity. Electronically Signed   By: Emmit Alexanders M.D.   On: 10/14/2022 09:15    Microbiology: Recent Results (from the past 240 hour(s))  SARS Coronavirus 2 by RT PCR (hospital order, performed in [facility] hospital lab) *cepheid single result test* Anterior Nasal Swab     Status: None   Collection Time: 10/14/22  8:37 AM   Specimen: Anterior Nasal Swab  Result Value Ref Range Status   SARS Coronavirus 2 by RT PCR NEGATIVE NEGATIVE Final    Comment: Performed at [facility], [place] [address] [place], [place] [address]     Labs: Basic Metabolic Panel: Recent Labs  Lab 10/14/22 1100 10/14/22 1118 10/14/22 1122 10/15/22 0028 10/17/22 0751  NA 133*  --  135 135 138  K 4.9  --  4.4 3.4* 4.4  CL 93*  --  98 97* 104  CO2 21*  --   --  28 23  GLUCOSE 100*  --  121* 125* 98  BUN 36*  --  34* 33* 26*  CREATININE 1.27*  --  1.30* 1.32* 1.07*  CALCIUM 9.0  --   --  8.7* 8.8*  MG  --  2.2  --   --  2.3  PHOS  --   --   --   --  3.2   Liver Function Tests: Recent Labs  Lab 10/14/22 1100 10/15/22 0028  AST 35 31  ALT 12 11  ALKPHOS 32* 28*  BILITOT 0.8 0.7  PROT 7.0 6.3*  ALBUMIN 3.8 3.2*   No results for input(s): "LIPASE", "AMYLASE" in the last 168 hours. No results for input(s): "AMMONIA" in the last 168 hours. CBC: Recent Labs  Lab 10/14/22 0850 10/14/22 1122 10/15/22 0028 10/17/22 0751  WBC 6.2  --  5.8 5.2  HGB 11.8*  12.9 11.4* 12.3  HCT 36.3 38.0 35.3* 36.6  MCV 95.0  --  94.9 92.0  PLT 177  --  258 266   Cardiac Enzymes: No results for input(s): "CKTOTAL", "CKMB", "CKMBINDEX", "TROPONINI" in the last 168 hours. BNP: BNP (last 3 results) Recent Labs    01/12/22 0915 01/27/22 2113 10/14/22 0900  BNP 353.2* 361.9* 321.6*    ProBNP (last 3 results) No results for input(s): "PROBNP" in the last 8760 hours.  CBG: No results for input(s): "GLUCAP" in the last 168 hours.     Signed:  Kayleen Memos, MD [facility] 10/17/2022, 11:11 AM

## 2022-10-17 NOTE — Progress Notes (Signed)
Progress Note  Patient Name: Brittany Irwin Date of Encounter: 10/17/2022  Primary Cardiologist: Noe Gens, DO  Subjective   No acute events overnight. No symptoms of chest pain or SOB.  Inpatient Medications    Scheduled Meds:  carbamazepine  400 mg Oral BID   enoxaparin (LOVENOX) injection  40 mg Subcutaneous Q24H   ezetimibe  10 mg Oral QHS   fenofibrate  160 mg Oral Daily   fluticasone furoate-vilanterol  1 puff Inhalation Daily   And   umeclidinium bromide  1 puff Inhalation Daily   pantoprazole  40 mg Oral Daily   prednisoLONE acetate  1 drop Left Eye QID   sodium chloride flush  3 mL Intravenous Q12H   tobramycin  1 drop Left Eye TID   Continuous Infusions:  PRN Meds: acetaminophen **OR** acetaminophen, acetaminophen-codeine, ALPRAZolam, cyclobenzaprine, levalbuterol, polyethylene glycol   Vital Signs    Vitals:   10/17/22 0147 10/17/22 0603 10/17/22 0734 10/17/22 0741  BP: 133/71 116/83 132/60   Pulse: (!) 50 (!) 48 (!) 50   Resp: 19 19 18    Temp: 97.6 F (36.4 C) (!) 97.3 F (36.3 C) 97.6 F (36.4 C)   TempSrc: Oral Oral Oral   SpO2: 98% 97% 98% 98%  Weight: 68.8 kg     Height:        Intake/Output Summary (Last 24 hours) at 10/17/2022 0925 Last data filed at 10/17/2022 0156 Gross per 24 hour  Intake 560 ml  Output 700 ml  Net -140 ml   Filed Weights   10/15/22 0450 10/16/22 0332 10/17/22 0147  Weight: 68.8 kg 68.8 kg 68.8 kg    Telemetry     Personally reviewed, HR controlled and NSR  ECG    EKG not performed today.  Physical Exam   GEN: No acute distress.   Neck: No JVD. Cardiac: RRR, no murmur, rub, or gallop.  Respiratory: Nonlabored. Clear to auscultation bilaterally. GI: Soft, nontender, bowel sounds present. MS: No edema; No deformity. Neuro:  Nonfocal. Psych: Alert and oriented x 3. Normal affect.  Labs    Chemistry Recent Labs  Lab 10/14/22 1100 10/14/22 1122 10/15/22 0028 10/17/22 0751  NA 133* 135 135 138   K 4.9 4.4 3.4* 4.4  CL 93* 98 97* 104  CO2 21*  --  28 23  GLUCOSE 100* 121* 125* 98  BUN 36* 34* 33* 26*  CREATININE 1.27* 1.30* 1.32* 1.07*  CALCIUM 9.0  --  8.7* 8.8*  PROT 7.0  --  6.3*  --   ALBUMIN 3.8  --  3.2*  --   AST 35  --  31  --   ALT 12  --  11  --   ALKPHOS 32*  --  28*  --   BILITOT 0.8  --  0.7  --   GFRNONAA 43*  --  41* 53*  ANIONGAP 19*  --  10 11     Hematology Recent Labs  Lab 10/14/22 0850 10/14/22 1122 10/15/22 0028 10/17/22 0751  WBC 6.2  --  5.8 5.2  RBC 3.82*  --  3.72* 3.98  HGB 11.8* 12.9 11.4* 12.3  HCT 36.3 38.0 35.3* 36.6  MCV 95.0  --  94.9 92.0  MCH 30.9  --  30.6 30.9  MCHC 32.5  --  32.3 33.6  RDW 13.1  --  12.7 12.9  PLT 177  --  258 266    Cardiac Enzymes Recent Labs  Lab 10/14/22 1037 10/14/22 1300  10/15/22 0803 10/15/22 1209 10/15/22 1411  TROPONINIHS 196* 228* 427* 360* 370*    BNP Recent Labs  Lab 10/14/22 0900  BNP 321.6*     DDimerNo results for input(s): "DDIMER" in the last 168 hours.   Radiology    ECHOCARDIOGRAM COMPLETE  Result Date: 10/15/2022    ECHOCARDIOGRAM REPORT   Patient Name:   Brittany Irwin [facility] Date of Exam: 10/15/2022 Medical Rec #:  [id number]         Height:       64.0 in Accession #:    [id number]        Weight:       151.7 lb Date of Birth:  1941/03/13        BSA:          1.739 m Patient Age:    31 years          BP:           122/58 mmHg Patient Gender: F                 HR:           57 bpm. Exam Location:  Inpatient Procedure: 2D Echo, Cardiac Doppler and Color Doppler Indications:    Chest Pain R07.9  History:        Patient has prior history of Echocardiogram examinations, most                 recent 01/03/2022. CHF, COPD, Signs/Symptoms:Chest Pain; Risk                 Factors:Hypertension. CKD, stage 3.  Sonographer:    Ronny Flurry Referring Phys: [id number] [place]  1. Left ventricular ejection fraction, by estimation, is 60 to 65%. The left ventricle has  normal function. The left ventricle has no regional wall motion abnormalities. There is mild concentric left ventricular hypertrophy. Left ventricular diastolic parameters are consistent with Grade I diastolic dysfunction (impaired relaxation).  2. Right ventricular systolic function was not well visualized. The right ventricular size is normal. There is normal pulmonary artery systolic pressure. The estimated right ventricular systolic pressure is 7.6 mmHg.  3. Left atrial size was mildly dilated.  4. The mitral valve is normal in structure. Mild mitral valve regurgitation. No evidence of mitral stenosis.  5. The aortic valve is normal in structure. Aortic valve regurgitation is trivial. No aortic stenosis is present.  6. The inferior vena cava is normal in size with greater than 50% respiratory variability, suggesting right atrial pressure of 3 mmHg. FINDINGS  Left Ventricle: Left ventricular ejection fraction, by estimation, is 60 to 65%. The left ventricle has normal function. The left ventricle has no regional wall motion abnormalities. The left ventricular internal cavity size was normal in size. There is  mild concentric left ventricular hypertrophy. Abnormal (paradoxical) septal motion, consistent with left bundle branch block. Left ventricular diastolic function could not be evaluated due to abnormal septal motion. Left ventricular diastolic parameters  are consistent with Grade I diastolic dysfunction (impaired relaxation). Right Ventricle: The right ventricular size is normal. No increase in right ventricular wall thickness. Right ventricular systolic function was not well visualized. There is normal pulmonary artery systolic pressure. The tricuspid regurgitant velocity is  1.07 m/s, and with an assumed right atrial pressure of 3 mmHg, the estimated right ventricular systolic pressure is 7.6 mmHg. Left Atrium: Left atrial size was mildly dilated. Right Atrium: Right atrial size was normal in size.  Pericardium:  There is no evidence of pericardial effusion. Mitral Valve: The mitral valve is normal in structure. Mild mitral annular calcification. Mild mitral valve regurgitation. No evidence of mitral valve stenosis. Tricuspid Valve: The tricuspid valve is normal in structure. Tricuspid valve regurgitation is not demonstrated. No evidence of tricuspid stenosis. Aortic Valve: The aortic valve is normal in structure. Aortic valve regurgitation is trivial. No aortic stenosis is present. Aortic valve mean gradient measures 2.0 mmHg. Aortic valve peak gradient measures 4.3 mmHg. Aortic valve area, by VTI measures 3.42 cm. Pulmonic Valve: The pulmonic valve was normal in structure. Pulmonic valve regurgitation is trivial. No evidence of pulmonic stenosis. Aorta: The aortic root is normal in size and structure. Venous: The inferior vena cava is normal in size with greater than 50% respiratory variability, suggesting right atrial pressure of 3 mmHg. IAS/Shunts: No atrial level shunt detected by color flow Doppler.  LEFT VENTRICLE PLAX 2D LVIDd:         4.50 cm   Diastology LVIDs:         3.10 cm   LV e' medial:   2.37 cm/s LV PW:         1.30 cm   LV E/e' medial: 23.5 LV IVS:        1.34 cm LVOT diam:     2.20 cm LV SV:         80 LV SV Index:   46 LVOT Area:     3.80 cm  RIGHT VENTRICLE            IVC RV S prime:     8.45 cm/s  IVC diam: 1.55 cm LEFT ATRIUM           Index        RIGHT ATRIUM           Index LA diam:      5.10 cm 2.93 cm/m   RA Area:     15.60 cm LA Vol (A2C): 54.1 ml 31.10 ml/m  RA Volume:   34.10 ml  19.60 ml/m LA Vol (A4C): 61.4 ml 35.30 ml/m  AORTIC VALVE AV Area (Vmax):    3.52 cm AV Area (Vmean):   3.48 cm AV Area (VTI):     3.42 cm AV Vmax:           104.00 cm/s AV Vmean:          68.500 cm/s AV VTI:            0.233 m AV Peak Grad:      4.3 mmHg AV Mean Grad:      2.0 mmHg LVOT Vmax:         96.17 cm/s LVOT Vmean:        62.667 cm/s LVOT VTI:          0.209 m LVOT/AV VTI ratio: 0.90   AORTA Ao Root diam: 3.10 cm Ao Asc diam:  3.60 cm MITRAL VALVE                TRICUSPID VALVE MV Area (PHT): 2.52 cm     TR Peak grad:   4.6 mmHg MV Decel Time: 301 msec     TR Vmax:        107.00 cm/s MV E velocity: 55.70 cm/s MV A velocity: 117.00 cm/s  SHUNTS MV E/A ratio:  0.48         Systemic VTI:  0.21 m  Systemic Diam: 2.20 cm Fransico Him MD Electronically signed by Fransico Him MD Signature Date/Time: 10/15/2022/2:45:26 PM    Final     Cardiac Studies   LHC in 2021 at [facility]  1. Left Main -no significant stenosis  2. Left anterior descending artery -mild, less than 25% gnosis and mid  vessel  3. Diagonals -large diagonal 1 with no significant stenosis  4. Left Circumflex -mild irregularities  5. Obtuse Marginals -no significant stenosis  6. Right Coronary Artery -mild irregularities  7. Posterior Descending Artery -no significant stenosis    Assessment & Plan   Patient is a 82 year old F known to have HTN, diastolic HF, CKD stage IIIa/B, COPD and possible pulmonary fibrosis on 3L home oxygen, Crohn's disease, presented to hospital with chest pain x 2 hours that woke her up from sleep. Cardiology was consulted for elevated troponin.  # Acute myocardial injury with no evidence of myocardial infarction -Likely secondary to demand ischemia, patient underwent LHC in 2018 and 2021 at [facility] that showed nonobstructive CAD. No indication for any noninvasive or invasive ischemia evaluation at this time.  # Wide-complex tachycardia likely rate related aberrancy # Dizziness -Patient had a few event monitors the last one from 02/2022 that was unremarkable. EKG from the ER showed wide-complex tachycardia, likely rate related aberrancy. Obtain 2-week event monitor upon discharge as she complains of paroxysms of tachycardia at home frequently. -Hold atenolol upon discharge. Can start diltiazem 30 mg daily as needed for palpitations. -For dizziness,  event monitor is reasonable discontinue losartan upon discharge at this dizziness could be from low blood pressures. -Patient moved to [place] 1 year ago and would like to switch her care from [facility] cardiology to [facility] cardiology.  Please place ambulatory referral to cardiology upon discharge (in the comments, please and that she would like to see Dr. Gwenlyn Found).  # HFpEF -Resume home dose of Lasix and discontinue losartan upon discharge.  I have spent a total of 30 minutes with patient reviewing chart, EKGs, labs and examining patient as well as establishing an assessment and plan that was discussed with the patient.  > 50% of time was spent in direct patient care.     I have spent a total of 30 minutes with patient reviewing chart , telemetry, EKGs, labs and examining patient as well as establishing an assessment and plan that was discussed with the patient.  > 50% of time was spent in direct patient care.     Signed, Chalmers Guest, MD  10/17/2022, 9:25 AM

## 2022-10-19 NOTE — Progress Notes (Unsigned)
Cardiology Office Note:    Date:  10/19/2022   ID:  Brittany Irwin, DOB 1941/06/01, MRN [id number]  PCP:  Sarina Ser, MD  Cardiologist:  Noe Gens, DO  Electrophysiologist:  None   Referring MD: Sarina Ser, MD   Chief Complaint: ***  History of Present Illness:    Brittany Irwin is a 82 y.o. female with a history of ***  Past Medical History:  Diagnosis Date   Acute CHF (congestive heart failure) ([place]) 01/02/2022   AKI (acute kidney injury) ([place]) 09/08/2020   Aspiration pneumonia ([place]) 01/05/2019   Atypical chest pain 03/11/2022   Bradycardia 03/14/2020   C. difficile colitis 04/03/2021   CHF (congestive heart failure) ([place])    Coronary artery disease    Tachycardia 05/31/2013    Past Surgical History:  Procedure Laterality Date   ABDOMINAL HYSTERECTOMY     BOWEL RESECTION     CHOLECYSTECTOMY      Current Medications: No outpatient medications have been marked as taking for the 10/21/22 encounter (Appointment) with Darreld Mclean, PA-C.     Allergies:   Azulfidine [sulfasalazine], Epipen [epinephrine], Flagyl [metronidazole], Motrin [ibuprofen], Nsaids, Penicillins, and Morphine and related   Social History   Socioeconomic History   Marital status: Single    Spouse name: Not on file   Number of children: Not on file   Years of education: Not on file   Highest education level: Not on file  Occupational History   Not on file  Tobacco Use   Smoking status: Former    Types: Cigarettes   Smokeless tobacco: Not on file  Substance and Sexual Activity   Alcohol use: Not Currently   Drug use: Never   Sexual activity: Not on file  Other Topics Concern   Not on file  Social History Narrative   Not on file   Social Determinants of Health   Financial Resource Strain: Not on file  Food Insecurity: No Food Insecurity (10/14/2022)   Hunger Vital Sign    Worried About Running Out of Food in the Last Year: Never true    Ran Out of Food in the  Last Year: Never true  Transportation Needs: No Transportation Needs (10/14/2022)   PRAPARE - Hydrologist (Medical): No    Lack of Transportation (Non-Medical): No  Physical Activity: Not on file  Stress: Not on file  Social Connections: Not on file     Family History: The patient's ***family history is not on file.  ROS:   Please see the history of present illness.    *** All other systems reviewed and are negative.  EKGs/Labs/Other Studies Reviewed:    The following studies were reviewed today: ***  EKG:  EKG *** ordered today. EKG personally reviewed and demonstrates ***.  Recent Labs: 01/02/2022: TSH 2.010 10/14/2022: B Natriuretic Peptide 321.6 10/15/2022: ALT 11 10/17/2022: BUN 26; Creatinine, Ser 1.07; Hemoglobin 12.3; Magnesium 2.3; Platelets 266; Potassium 4.4; Sodium 138  Recent Lipid Panel No results found for: "CHOL", "TRIG", "HDL", "CHOLHDL", "VLDL", "LDLCALC", "LDLDIRECT"  Physical Exam:    Vital Signs: There were no vitals taken for this visit.    Wt Readings from Last 3 Encounters:  10/17/22 151 lb 11.2 oz (68.8 kg)  01/27/22 153 lb 6.4 oz (69.6 kg)  01/12/22 154 lb 15.7 oz (70.3 kg)     General: 82 y.o. female in no acute distress. HEENT: Normocephalic and atraumatic. Sclera clear. EOMs intact. Neck: Supple. No carotid  bruits. No JVD. Heart: *** RRR. Distinct S1 and S2. No murmurs, gallops, or rubs. Radial and distal pedal pulses 2+ and equal bilaterally. Lungs: No increased work of breathing. Clear to ausculation bilaterally. No wheezes, rhonchi, or rales.  Abdomen: Soft, non-distended, and non-tender to palpation. Bowel sounds present in all 4 quadrants.  MSK: Normal strength and tone for age. *** Extremities: No lower extremity edema.    Skin: Warm and dry. Neuro: Alert and oriented x3. No focal deficits. Psych: Normal affect. Responds appropriately.   Assessment:    No diagnosis found.  Plan:     Disposition:  Follow up in ***   Medication Adjustments/Labs and Tests Ordered: Current medicines are reviewed at length with the patient today.  Concerns regarding medicines are outlined above.  No orders of the defined types were placed in this encounter.  No orders of the defined types were placed in this encounter.   There are no Patient Instructions on file for this visit.   Signed, Darreld Mclean, PA-C  10/19/2022 12:57 PM    [place]

## 2022-10-20 NOTE — Progress Notes (Unsigned)
Cardiology Office Note:    Date:  10/21/2022   ID:  Brittany [facility], DOB June 04, 1941, MRN [id number]  PCP:  Brittany Ser, MD   [place] [facility] Cardiologist:  Brittany Gens, DO    Referring MD: Brittany Ser, MD   Chief Complaint  Patient presents with   [facility] follow up    Tachycardia    History of Present Illness:    Brittany Irwin is a 82 y.o. female with a hx of chronic diastolic heart failure, hypertension, COPD, possible pulmonary fibrosis on 3-4 L O2 at baseline, Crohn's disease, Xanax, CKD stage IIIa/B, hypertension. Patient recently established care with Dr. Gwenlyn Irwin   Patient has been followed by [facility].  Per chart review, had a right/left heart catheterization in 12/2016 that showed normal coronary arteries, LVEDP out of proportion to PCWP suggestive of a noncompliant left ventricle. Repeat heart catheterization in 11/2019 showed nonobstructive CAD and elevated LVEDP. It was recommended that she be treated for HFpEF.   Echocardiogram in 02/2020 showed EF 65-70%, moderate LVH, mild MR, mild AI.  She wore a 30-day cardiac event monitor in 02/2022 that showed rare PVCs and PACs, no evidence of sustained tacky or bradycardia arrhythmia, no pauses, no atrial fibrillation.  She had a Cardiolite stress test on 03/13/2022 that was a normal study with no evidence of ischemia or scar.  Patient was admitted to [facility] in 09/2022 for evaluation of chest pain.  Patient complained of 2 hours of substernal cramping chest tightness that woke her up from sleep.  Also complained of nausea.  Nitroglycerin relieved her symptoms.  Her heart rate was elevated in the ED, but it improved with anxiety medication.  Her troponins were elevated in the ED (196, 228, 427, 360, 370).  BNP 321.  Echocardiogram 10/15/2022 showed EF 60-65%, no regional wall motion abnormalities, grade 1 diastolic dysfunction, normal RV systolic function, mild mitral valve  regurgitation.  Today, patient presents to the clinic accompanied by her sister.  She is concerned that her blood pressure has been elevated since she came home from the hospital, and she is having frequent episodes of palpitations/elevated heart rates.  She previously was taking atenolol 12.5 mg daily and losartan 25 mg daily.  When she was admitted to the hospital, both of these medications were stopped.  She was discharged on as needed Cardizem.  Patient reports that as needed Cardizem is not helping to control her symptoms and she is having a lot of issues with palpitations and tachycardia.  Patient also complains of random episodes of dizziness.  These episodes are not associated with position changes and actually occur randomly.  She had an episode of near syncope prior to going to the hospital, but has not had any episodes of syncope/near syncope since.  She denies recurrent episodes of chest pain.  Breathing is stable.  Patient expressed concern that our practice is rather large, and she does not like that she will be seeing multiple providers.  Given these concerns, she wants to follow-up with Britt[facility], her cardiologist at [facility]   Past Medical History:  Diagnosis Date   Acute CHF (congestive heart failure) 01/02/2022   AKI (acute kidney injury) 09/08/2020   Aspiration pneumonia 01/05/2019   Atypical chest pain 03/11/2022   Bradycardia 03/14/2020   C. difficile colitis 04/03/2021   CHF (congestive heart failure)    Coronary artery disease    Tachycardia 05/31/2013    Past Surgical History:  Procedure Laterality Date   ABDOMINAL HYSTERECTOMY     BOWEL RESECTION     CHOLECYSTECTOMY      Current Medications: Current Meds  Medication Sig   acetaminophen-codeine (TYLENOL #4) 300-60 MG tablet Take 1 tablet by mouth 4 (four) times daily as needed for pain.   Aloe-Sodium Chloride (AYR SALINE NASAL GEL NA) Place 1 Application into the nose 2 (two) times daily as needed (Dry nose).    ALPRAZolam (XANAX) 1 MG tablet Take 1 mg by mouth 3 (three) times daily as needed for anxiety.   atenolol (TENORMIN) 25 MG tablet Take half tablet (12.5 mg) by mouth daily. DO NOT take if heart rate is less than 60 BPM   carbamazepine (CARBATROL) 200 MG 12 hr capsule Take 400 mg by mouth 2 (two) times daily.   cyanocobalamin (,VITAMIN B-12,) 1000 MCG/ML injection Inject 1,000 mcg into the muscle every 30 (thirty) days.   cyclobenzaprine (FLEXERIL) 5 MG tablet Take 5 mg by mouth 3 (three) times daily as needed for muscle spasms.   diclofenac Sodium (VOLTAREN ARTHRITIS PAIN) 1 % GEL Apply 1 Application topically 2 (two) times daily as needed (back pain).   ergocalciferol (VITAMIN D2) 1.25 MG (50000 UT) capsule Take 50,000 Units by mouth every Friday.   ezetimibe (ZETIA) 10 MG tablet Take 10 mg by mouth at bedtime.   fenofibrate micronized (LOFIBRA) 200 MG capsule Take 200 mg by mouth daily.   furosemide (LASIX) 20 MG tablet Take 60 mg by mouth in the morning and at bedtime.   gabapentin (NEURONTIN) 100 MG capsule Take 100 mg by mouth daily.   levalbuterol (XOPENEX HFA) 45 MCG/ACT inhaler Inhale 2 puffs into the lungs every 6 (six) hours as needed for shortness of breath or wheezing.   losartan (COZAAR) 25 MG tablet Take 0.5 tablets (12.5 mg total) by mouth daily.   Menthol, Topical Analgesic, (ICY HOT BACK EX) Apply 1 Application topically 2 (two) times daily as needed (back pain).   metolazone (ZAROXOLYN) 5 MG tablet Take 1 tablet (5 mg total) by mouth daily as needed (edema). Please take as needed in case of weight gain 3 lbs in 24 to 48 hrs or 5 lbs in 7 days.   nitroGLYCERIN (NITROSTAT) 0.4 MG SL tablet Place 0.4 mg under the tongue every 5 (five) minutes x 3 doses as needed for chest pain.   pantoprazole (PROTONIX) 40 MG tablet Take 40 mg by mouth daily.   potassium chloride SA (KLOR-CON M) 20 MEQ tablet Take 20 mEq by mouth daily.   prednisoLONE acetate (PRED FORTE) 1 % ophthalmic suspension  Place 1 drop into the left eye 4 (four) times daily.   tobramycin (TOBREX) 0.3 % ophthalmic solution Place 1 drop into the left eye in the morning, at noon, in the evening, and at bedtime.   TRELEGY ELLIPTA 200-62.5-25 MCG/ACT AEPB Inhale 1 puff into the lungs daily.   [DISCONTINUED] diltiazem (CARDIZEM) 30 MG tablet Take 1 tablet (30 mg total) by mouth once as needed for up to 14 days (for tachycardia with HR sustained 120 or more).     Allergies:   Azulfidine [sulfasalazine], Epipen [epinephrine], Flagyl [metronidazole], Motrin [ibuprofen], Nsaids, Penicillins, and Morphine and related   Social History   Socioeconomic History   Marital status: Single    Spouse name: Not on file   Number of children: Not on file   Years of education: Not on file   Highest education level: Not on file  Occupational History  Not on file  Tobacco Use   Smoking status: Former    Types: Cigarettes   Smokeless tobacco: Not on file  Substance and Sexual Activity   Alcohol use: Not Currently   Drug use: Never   Sexual activity: Not on file  Other Topics Concern   Not on file  Social History Narrative   Not on file   Social Determinants of Health   Financial Resource Strain: Not on file  Food Insecurity: No Food Insecurity (10/14/2022)   Hunger Vital Sign    Worried About Running Out of Food in the Last Year: Never true    Ran Out of Food in the Last Year: Never true  Transportation Needs: No Transportation Needs (10/14/2022)   PRAPARE - Hydrologist (Medical): No    Lack of Transportation (Non-Medical): No  Physical Activity: Not on file  Stress: Not on file  Social Connections: Not on file     Family History: The patient's family history is not on file.  ROS:   Please see the history of present illness.     All other systems reviewed and are negative.  EKGs/Labs/Other Studies Reviewed:    The following studies were reviewed today:  Cardiac Studies &  Procedures       ECHOCARDIOGRAM  ECHOCARDIOGRAM COMPLETE 10/15/2022  Narrative ECHOCARDIOGRAM REPORT    Patient Name:   [facility] Date of Exam: 10/15/2022 Medical Rec #:  [id number]         Height:       64.0 in Accession #:    [id number]        Weight:       151.7 lb Date of Birth:  January 27, 1941        BSA:          1.739 m Patient Age:    17 years          BP:           122/58 mmHg Patient Gender: F                 HR:           57 bpm. Exam Location:  Inpatient  Procedure: 2D Echo, Cardiac Doppler and Color Doppler  Indications:    Chest Pain R07.9  History:        Patient has prior history of Echocardiogram examinations, most recent 01/03/2022. CHF, COPD, Signs/Symptoms:Chest Pain; Risk Factors:Hypertension. CKD, stage 3.  Sonographer:    Ronny Flurry Referring Phys: [id number] [place]   1. Left ventricular ejection fraction, by estimation, is 60 to 65%. The left ventricle has normal function. The left ventricle has no regional wall motion abnormalities. There is mild concentric left ventricular hypertrophy. Left ventricular diastolic parameters are consistent with Grade I diastolic dysfunction (impaired relaxation). 2. Right ventricular systolic function was not well visualized. The right ventricular size is normal. There is normal pulmonary artery systolic pressure. The estimated right ventricular systolic pressure is 7.6 mmHg. 3. Left atrial size was mildly dilated. 4. The mitral valve is normal in structure. Mild mitral valve regurgitation. No evidence of mitral stenosis. 5. The aortic valve is normal in structure. Aortic valve regurgitation is trivial. No aortic stenosis is present. 6. The inferior vena cava is normal in size with greater than 50% respiratory variability, suggesting right atrial pressure of 3 mmHg.  FINDINGS Left Ventricle: Left ventricular ejection fraction, by estimation, is 60 to 65%. The left ventricle  has normal  function. The left ventricle has no regional wall motion abnormalities. The left ventricular internal cavity size was normal in size. There is mild concentric left ventricular hypertrophy. Abnormal (paradoxical) septal motion, consistent with left bundle branch block. Left ventricular diastolic function could not be evaluated due to abnormal septal motion. Left ventricular diastolic parameters are consistent with Grade I diastolic dysfunction (impaired relaxation).  Right Ventricle: The right ventricular size is normal. No increase in right ventricular wall thickness. Right ventricular systolic function was not well visualized. There is normal pulmonary artery systolic pressure. The tricuspid regurgitant velocity is 1.07 m/s, and with an assumed right atrial pressure of 3 mmHg, the estimated right ventricular systolic pressure is 7.6 mmHg.  Left Atrium: Left atrial size was mildly dilated.  Right Atrium: Right atrial size was normal in size.  Pericardium: There is no evidence of pericardial effusion.  Mitral Valve: The mitral valve is normal in structure. Mild mitral annular calcification. Mild mitral valve regurgitation. No evidence of mitral valve stenosis.  Tricuspid Valve: The tricuspid valve is normal in structure. Tricuspid valve regurgitation is not demonstrated. No evidence of tricuspid stenosis.  Aortic Valve: The aortic valve is normal in structure. Aortic valve regurgitation is trivial. No aortic stenosis is present. Aortic valve mean gradient measures 2.0 mmHg. Aortic valve peak gradient measures 4.3 mmHg. Aortic valve area, by VTI measures 3.42 cm.  Pulmonic Valve: The pulmonic valve was normal in structure. Pulmonic valve regurgitation is trivial. No evidence of pulmonic stenosis.  Aorta: The aortic root is normal in size and structure.  Venous: The inferior vena cava is normal in size with greater than 50% respiratory variability, suggesting right atrial pressure of 3  mmHg.  IAS/Shunts: No atrial level shunt detected by color flow Doppler.   LEFT VENTRICLE PLAX 2D LVIDd:         4.50 cm   Diastology LVIDs:         3.10 cm   LV e' medial:   2.37 cm/s LV PW:         1.30 cm   LV E/e' medial: 23.5 LV IVS:        1.34 cm LVOT diam:     2.20 cm LV SV:         80 LV SV Index:   46 LVOT Area:     3.80 cm   RIGHT VENTRICLE            IVC RV S prime:     8.45 cm/s  IVC diam: 1.55 cm  LEFT ATRIUM           Index        RIGHT ATRIUM           Index LA diam:      5.10 cm 2.93 cm/m   RA Area:     15.60 cm LA Vol (A2C): 54.1 ml 31.10 ml/m  RA Volume:   34.10 ml  19.60 ml/m LA Vol (A4C): 61.4 ml 35.30 ml/m AORTIC VALVE AV Area (Vmax):    3.52 cm AV Area (Vmean):   3.48 cm AV Area (VTI):     3.42 cm AV Vmax:           104.00 cm/s AV Vmean:          68.500 cm/s AV VTI:            0.233 m AV Peak Grad:      4.3 mmHg AV Mean Grad:  2.0 mmHg LVOT Vmax:         96.17 cm/s LVOT Vmean:        62.667 cm/s LVOT VTI:          0.209 m LVOT/AV VTI ratio: 0.90  AORTA Ao Root diam: 3.10 cm Ao Asc diam:  3.60 cm  MITRAL VALVE                TRICUSPID VALVE MV Area (PHT): 2.52 cm     TR Peak grad:   4.6 mmHg MV Decel Time: 301 msec     TR Vmax:        107.00 cm/s MV E velocity: 55.70 cm/s MV A velocity: 117.00 cm/s  SHUNTS MV E/A ratio:  0.48         Systemic VTI:  0.21 m Systemic Diam: 2.20 cm  Fransico Him MD Electronically signed by Fransico Him MD Signature Date/Time: 10/15/2022/2:45:26 PM    Final             Cardiolite stress test 03/13/2022 ([facility]) 1) SPECT Tc97m Cardiolite stress myocardial perfusion imaging is normal. There is no scintigraphic evidence of regional myocardial ischemia or scar.  2) TID 1.24 (visually there is no TID)  3) There was normal wall thickening post stress.   Cardiac Catheterization 12/14/19 ([facility]) Coronary Angiography  1. Left Main -no significant stenosis  2. Left anterior descending artery  -mild, less than 25% gnosis and mid  vessel  3. Diagonals -large diagonal 1 with no significant stenosis  4. Left Circumflex -mild irregularities  5. Obtuse Marginals -no significant stenosis  6. Right Coronary Artery -mild irregularities  7. Posterior Descending Artery -no significant stenosis    Recent Labs: 01/02/2022: TSH 2.010 10/14/2022: B Natriuretic Peptide 321.6 10/15/2022: ALT 11 10/17/2022: BUN 26; Creatinine, [facility] 1.07; Hemoglobin 12.3; Magnesium 2.3; Platelets 266; Potassium 4.4; Sodium 138  Recent Lipid Panel No results Ir[facility] for: "CHOL", "TRIG", "HDL", "CHOLHDL", "VLDL", "LDLCALC", "LDLDIRECT"   Risk Assessment/Calculations:      HYPERTENSION CONTROL Vitals:   10/21/22 1519 10/21/22 1625  BP: (!) 142/72 (!) 144/64    The patient's blood pressure is elevated above target today.  In order to address the patient's elevated BP: A new medication was prescribed today.            Physical Exam:    VS:  BP (!) 144/64   Pulse 62   Ht 5\' 4"  (1.626 m)   Wt 152 lb (68.9 kg)   BMI 26.09 kg/m     Wt Readings from Last 3 Encounters:  10/21/22 152 lb (68.9 kg)  10/17/22 151 lb 11.2 oz (68.8 kg)  01/27/22 153 lb 6.4 oz (69.6 kg)     GEN: Elderly female, sitting in her wheelchair in no acute distress. Wearing [place] with supplemental oxygen  HEENT: Normal NECK: No JVD CARDIAC: RRR, no murmurs, rubs, gallops. Radial pulses 2+ bilaterally  RESPIRATORY:  Clear to auscultation without rales, wheezing or rhonchi. Normal WOB on [place]  ABDOMEN: Soft, non-tender, non-distended MUSCULOSKELETAL:  No edema in BLE; No deformity  SKIN: Warm and dry NEUROLOGIC:  Alert and oriented x 3 PSYCHIATRIC:  Normal affect   ASSESSMENT:    1. Palpitations   2. Atypical chest pain   3. Chronic diastolic (congestive) heart failure   4. Essential hypertension   5. Chronic obstructive pulmonary disease, unspecified COPD type   6. Chronic respiratory failure with hypoxia   7. Medication  management    PLAN:  In order of problems listed above:  Palpitations  -Patient reports that she has been having episodes of elevated heart rates and palpitations at home. -When she was admitted to the hospital from 3/27 - 3/30, she was [facility] to have tachycardia with a rate related IVCD. She was discharged on PRN cardizem  -Patient reports that her symptoms are not well-controlled on PRN Cardizem.  Previously, she was on atenolol 12.5 mg daily (with hold parameters for bradycardia) and did well on this medication.  - Stop cardizem  - Resume previous atenolol 12.5 mg daily. I instructed patient to not take atenolol if heart rate is less than 60 bpm - At discharge, it was recommended that patient wear cardiac event monitor as an outpatient.  Discussed with patient today.  As patient prefers to follow-up with her cardiologist at [place] ([facility], has an appointment on 4/16) I will defer outpatient monitor to her primary cardiologist  Chronic, atypical chest pain - Cardiac catheterization in 11/2019 showed minimal, nonobstructive CAD - Cardiolite stress test in 02/2022 was a normal study with no evidence of ischemia or scar - She was admitted to the hospital from 3/27 - 3/30 for treatment of chest pain and tachycardia.  Her troponins were mildly elevated and peaked around 400.  Her chest pain resolved with resolution of tachycardia. Echocardiogram on 3/28 showed EF 60-65%, no regional wall motion abnormalities, grade 1 diastolic dysfunction.  Since patient's symptoms relieved with resolution of tachycardia, the decision was made to not pursue ischemic evaluation.  Troponin elevation was suspected to be demand ischemia in the setting of tachycardia -Patient reports that since leaving the hospital, she has not had recurrence of chest pain.   Chronic HFpEF - Most recent echocardiogram from 10/15/2022 showed EF 60-65%, no regional wall motion abnormalities, grade 1 diastolic dysfunction - Patient is  euvolemic on exam today.  Denies ankle edema, worsening shortness of breath (note, she does have chronic SOB due to COPD) - Continue Lasix 60 mg daily and Klor-Con 20 meq daily  - Ordered BMP  HTN - BP slightly elevated in the office today.  Was initially 142/72.  Remained elevated at 144/64 on my recheck - Patient checks her blood pressure at home and reports her blood pressure has been elevated since being discharged from the hospital.  Prior to her admission, she was on both atenolol and losartan 25 mg daily - Resume atenolol 12.5 mg daily as above - Resume losartan at 12.5 mg daily -Instructed patient to keep a blood pressure log for the next 2 weeks until her appointment with [facility] on 4/16 -Ordered BMP today to check renal function and potassium prior to starting losartan  COPD Chronic Hypoxic Respiratory failure  -On chronic O2, 3-4 L at baseline  - Managed per PCP/pulmonology   Patient expressed concern that our practice is rather large, and she does not like that she will be seeing multiple providers.  Given these concerns, she wants to follow-up with [facility], her cardiologist at [facility].  Medication Adjustments/Labs and Tests Ordered: Current medicines are reviewed at length with the patient today.  Concerns regarding medicines are outlined above.  Orders Placed This Encounter  Procedures   Basic metabolic panel   Meds ordered this encounter  Medications   atenolol (TENORMIN) 25 MG tablet    Sig: Take half tablet (12.5 mg) by mouth daily. DO NOT take if heart rate is less than 60 BPM    Dispense:  15 tablet    Refill:  0   losartan (COZAAR) 25 MG tablet    Sig: Take 0.5 tablets (12.5 mg total) by mouth daily.    Dispense:  15 tablet    Refill:  0    Patient Instructions  Medication Instructions:   STOP Diltiazem START Atenolol 12.5 mg daily. DO NOT take if heart rate (Pulse) is less than 60 BPM START Losartan 12.5 mg daily   *If you need a refill on your cardiac  medications before your next appointment, please call your pharmacy*  Lab Work: Your physician recommends that you have lab work TODAY:  BMP  If you have labs (blood work) drawn today and your tests are completely normal, you will receive your results only by: [place] (if you have MyChart) OR A paper copy in the mail If you have any lab test that is abnormal or we need to change your treatment, we will call you to review the results.  Testing/Procedures: NONE ordered at this time of appointment   Follow-Up: At [facility], you and your health needs are our priority.  As part of our continuing mission to provide you with exceptional heart care, we have created designated Provider Care Teams.  These Care Teams include your primary Cardiologist (physician) and Advanced Practice Providers (APPs -  Physician Assistants and Nurse Practitioners) who all work together to provide you with the care you need, when you need it.  We recommend signing up for the patient portal called "MyChart".  Sign up information is provided on this After Visit Summary.  MyChart is used to connect with patients for Virtual Visits (Telemedicine).  Patients are able to view lab/test results, encounter notes, upcoming appointments, etc.  Non-urgent messages can be sent to your provider as well.   To learn more about what you can do with MyChart, go to [email or web address].    Your next appointment:   Follow up with your Cardiologist at [facility] May follow up with [facility]Care As Needed   Provider:   Noe Gens, DO     Other Instructions     Signed, Margie Billet, PA-C  10/21/2022 4:41 PM    [place]

## 2022-10-21 ENCOUNTER — Ambulatory Visit: Payer: Medicare Other | Attending: Student | Admitting: Cardiology

## 2022-10-21 ENCOUNTER — Encounter: Payer: Self-pay | Admitting: Student

## 2022-10-21 VITALS — BP 144/64 | HR 62 | Ht 64.0 in | Wt 152.0 lb

## 2022-10-21 DIAGNOSIS — Z79899 Other long term (current) drug therapy: Secondary | ICD-10-CM

## 2022-10-21 DIAGNOSIS — J449 Chronic obstructive pulmonary disease, unspecified: Secondary | ICD-10-CM

## 2022-10-21 DIAGNOSIS — R002 Palpitations: Secondary | ICD-10-CM

## 2022-10-21 DIAGNOSIS — R0789 Other chest pain: Secondary | ICD-10-CM | POA: Diagnosis not present

## 2022-10-21 DIAGNOSIS — I10 Essential (primary) hypertension: Secondary | ICD-10-CM

## 2022-10-21 DIAGNOSIS — I5032 Chronic diastolic (congestive) heart failure: Secondary | ICD-10-CM

## 2022-10-21 DIAGNOSIS — J9611 Chronic respiratory failure with hypoxia: Secondary | ICD-10-CM

## 2022-10-21 MED ORDER — LOSARTAN POTASSIUM 25 MG PO TABS: 12.5000 mg | ORAL_TABLET | Freq: Every day | ORAL | 0 refills | Status: DC

## 2022-10-21 MED ORDER — ATENOLOL 25 MG PO TABS: ORAL_TABLET | ORAL | 0 refills | Status: DC

## 2022-10-21 NOTE — Patient Instructions (Signed)
Medication Instructions:   STOP Diltiazem START Atenolol 12.5 mg daily. DO NOT take if heart rate (Pulse) is less than 60 BPM START Losartan 12.5 mg daily   *If you need a refill on your cardiac medications before your next appointment, please call your pharmacy*  Lab Work: Your physician recommends that you have lab work TODAY:  BMP  If you have labs (blood work) drawn today and your tests are completely normal, you will receive your results only by: [place] (if you have MyChart) OR A paper copy in the mail If you have any lab test that is abnormal or we need to change your treatment, we will call you to review the results.  Testing/Procedures: NONE ordered at this time of appointment   Follow-Up: At [facility], you and your health needs are our priority.  As part of our continuing mission to provide you with exceptional heart care, we have created designated Provider Care Teams.  These Care Teams include your primary Cardiologist (physician) and Advanced Practice Providers (APPs -  Physician Assistants and Nurse Practitioners) who all work together to provide you with the care you need, when you need it.  We recommend signing up for the patient portal called "MyChart".  Sign up information is provided on this After Visit Summary.  MyChart is used to connect with patients for Virtual Visits (Telemedicine).  Patients are able to view lab/test results, encounter notes, upcoming appointments, etc.  Non-urgent messages can be sent to your provider as well.   To learn more about what you can do with MyChart, go to [email or web address].    Your next appointment:   Follow up with your Cardiologist at [facility] May follow up with HeartCare As Needed   Provider:   Noe Gens, DO     Other Instructions

## 2022-10-22 LAB — BASIC METABOLIC PANEL
BUN/Creatinine Ratio: 36 — ABNORMAL HIGH (ref 12–28)
BUN: 35 mg/dL — ABNORMAL HIGH (ref 8–27)
CO2: 24 mmol/L (ref 20–29)
Calcium: 9.4 mg/dL (ref 8.7–10.3)
Chloride: 99 mmol/L (ref 96–106)
Creatinine, Ser: 0.98 mg/dL (ref 0.57–1.00)
Glucose: 84 mg/dL (ref 70–99)
Potassium: 4.4 mmol/L (ref 3.5–5.2)
Sodium: 140 mmol/L (ref 134–144)
eGFR: 58 mL/min/{1.73_m2} — ABNORMAL LOW (ref >59)

## 2023-05-27 ENCOUNTER — Other Ambulatory Visit (HOSPITAL_COMMUNITY): Payer: Self-pay

## 2023-05-27 MED ORDER — TRELEGY ELLIPTA 200-62.5-25 MCG/ACT IN AEPB
1.0000 | INHALATION_SPRAY | Freq: Every day | RESPIRATORY_TRACT | 11 refills | Status: DC
  Filled 2023-05-27 – 2023-05-28 (×2): qty 60, 30d supply, fill #0

## 2023-05-28 ENCOUNTER — Other Ambulatory Visit (HOSPITAL_COMMUNITY): Payer: Self-pay

## 2023-05-28 ENCOUNTER — Other Ambulatory Visit: Payer: Self-pay

## 2023-05-31 ENCOUNTER — Other Ambulatory Visit (HOSPITAL_COMMUNITY): Payer: Self-pay

## 2023-08-11 ENCOUNTER — Inpatient Hospital Stay (HOSPITAL_COMMUNITY): Payer: Medicare Other

## 2023-08-11 ENCOUNTER — Encounter (HOSPITAL_COMMUNITY): Payer: Self-pay

## 2023-08-11 ENCOUNTER — Emergency Department (HOSPITAL_COMMUNITY): Payer: Medicare Other

## 2023-08-11 ENCOUNTER — Inpatient Hospital Stay (HOSPITAL_COMMUNITY)
Admission: EM | Admit: 2023-08-11 | Discharge: 2023-08-15 | DRG: 388 | Disposition: A | Discharge status: Home / Self Care | Payer: Medicare Other | Attending: Internal Medicine | Admitting: Internal Medicine

## 2023-08-11 ENCOUNTER — Other Ambulatory Visit: Payer: Self-pay

## 2023-08-11 DIAGNOSIS — Z886 Allergy status to analgesic agent status: Secondary | ICD-10-CM

## 2023-08-11 DIAGNOSIS — I10 Essential (primary) hypertension: Secondary | ICD-10-CM | POA: Diagnosis present

## 2023-08-11 DIAGNOSIS — J449 Chronic obstructive pulmonary disease, unspecified: Secondary | ICD-10-CM | POA: Diagnosis present

## 2023-08-11 DIAGNOSIS — J019 Acute sinusitis, unspecified: Secondary | ICD-10-CM | POA: Diagnosis present

## 2023-08-11 DIAGNOSIS — I5032 Chronic diastolic (congestive) heart failure: Secondary | ICD-10-CM | POA: Diagnosis present

## 2023-08-11 DIAGNOSIS — I495 Sick sinus syndrome: Secondary | ICD-10-CM | POA: Diagnosis present

## 2023-08-11 DIAGNOSIS — I2510 Atherosclerotic heart disease of native coronary artery without angina pectoris: Secondary | ICD-10-CM | POA: Diagnosis present

## 2023-08-11 DIAGNOSIS — F05 Delirium due to known physiological condition: Secondary | ICD-10-CM | POA: Diagnosis present

## 2023-08-11 DIAGNOSIS — K5090 Crohn's disease, unspecified, without complications: Secondary | ICD-10-CM | POA: Diagnosis present

## 2023-08-11 DIAGNOSIS — Z79899 Other long term (current) drug therapy: Secondary | ICD-10-CM | POA: Diagnosis not present

## 2023-08-11 DIAGNOSIS — Z8719 Personal history of other diseases of the digestive system: Secondary | ICD-10-CM

## 2023-08-11 DIAGNOSIS — Z9049 Acquired absence of other specified parts of digestive tract: Secondary | ICD-10-CM

## 2023-08-11 DIAGNOSIS — K56609 Unspecified intestinal obstruction, unspecified as to partial versus complete obstruction: Secondary | ICD-10-CM | POA: Diagnosis present

## 2023-08-11 DIAGNOSIS — I130 Hypertensive heart and chronic kidney disease with heart failure and stage 1 through stage 4 chronic kidney disease, or unspecified chronic kidney disease: Secondary | ICD-10-CM | POA: Diagnosis present

## 2023-08-11 DIAGNOSIS — Z950 Presence of cardiac pacemaker: Secondary | ICD-10-CM | POA: Diagnosis present

## 2023-08-11 DIAGNOSIS — Z9981 Dependence on supplemental oxygen: Secondary | ICD-10-CM | POA: Diagnosis not present

## 2023-08-11 DIAGNOSIS — G9341 Metabolic encephalopathy: Secondary | ICD-10-CM | POA: Diagnosis present

## 2023-08-11 DIAGNOSIS — J9611 Chronic respiratory failure with hypoxia: Secondary | ICD-10-CM | POA: Diagnosis present

## 2023-08-11 DIAGNOSIS — I422 Other hypertrophic cardiomyopathy: Secondary | ICD-10-CM | POA: Diagnosis present

## 2023-08-11 DIAGNOSIS — F419 Anxiety disorder, unspecified: Secondary | ICD-10-CM | POA: Diagnosis present

## 2023-08-11 DIAGNOSIS — E785 Hyperlipidemia, unspecified: Secondary | ICD-10-CM | POA: Diagnosis present

## 2023-08-11 DIAGNOSIS — K7460 Unspecified cirrhosis of liver: Secondary | ICD-10-CM | POA: Diagnosis present

## 2023-08-11 DIAGNOSIS — Z1152 Encounter for screening for COVID-19: Secondary | ICD-10-CM | POA: Diagnosis not present

## 2023-08-11 DIAGNOSIS — Z883 Allergy status to other anti-infective agents status: Secondary | ICD-10-CM

## 2023-08-11 DIAGNOSIS — K219 Gastro-esophageal reflux disease without esophagitis: Secondary | ICD-10-CM | POA: Diagnosis present

## 2023-08-11 DIAGNOSIS — M549 Dorsalgia, unspecified: Secondary | ICD-10-CM | POA: Diagnosis present

## 2023-08-11 DIAGNOSIS — N1831 Chronic kidney disease, stage 3a: Secondary | ICD-10-CM | POA: Diagnosis present

## 2023-08-11 DIAGNOSIS — Z7951 Long term (current) use of inhaled steroids: Secondary | ICD-10-CM

## 2023-08-11 DIAGNOSIS — Z888 Allergy status to other drugs, medicaments and biological substances status: Secondary | ICD-10-CM

## 2023-08-11 DIAGNOSIS — G8929 Other chronic pain: Secondary | ICD-10-CM | POA: Diagnosis present

## 2023-08-11 DIAGNOSIS — Z880 Allergy status to penicillin: Secondary | ICD-10-CM

## 2023-08-11 DIAGNOSIS — Z87891 Personal history of nicotine dependence: Secondary | ICD-10-CM | POA: Diagnosis not present

## 2023-08-11 DIAGNOSIS — Z885 Allergy status to narcotic agent status: Secondary | ICD-10-CM

## 2023-08-11 LAB — COMPREHENSIVE METABOLIC PANEL
ALT: 9 U/L (ref 0–44)
AST: 30 U/L (ref 15–41)
Albumin: 3.7 g/dL (ref 3.5–5.0)
Alkaline Phosphatase: 34 U/L — ABNORMAL LOW (ref 38–126)
Anion gap: 12 (ref 5–15)
BUN: 24 mg/dL — ABNORMAL HIGH (ref 8–23)
CO2: 28 mmol/L (ref 22–32)
Calcium: 9.5 mg/dL (ref 8.9–10.3)
Chloride: 99 mmol/L (ref 98–111)
Creatinine, Ser: 1.12 mg/dL — ABNORMAL HIGH (ref 0.44–1.00)
GFR, Estimated: 49 mL/min — ABNORMAL LOW (ref >60)
Glucose, Bld: 141 mg/dL — ABNORMAL HIGH (ref 70–99)
Potassium: 4.7 mmol/L (ref 3.5–5.1)
Sodium: 139 mmol/L (ref 135–145)
Total Bilirubin: 0.9 mg/dL (ref 0.0–1.2)
Total Protein: 7.1 g/dL (ref 6.5–8.1)

## 2023-08-11 LAB — CBC WITH DIFFERENTIAL/PLATELET
Abs Immature Granulocytes: 0.03 10*3/uL (ref 0.00–0.07)
Basophils Absolute: 0.1 10*3/uL (ref 0.0–0.1)
Basophils Relative: 1 %
Eosinophils Absolute: 0.2 10*3/uL (ref 0.0–0.5)
Eosinophils Relative: 2 %
HCT: 39.7 % (ref 36.0–46.0)
Hemoglobin: 12.6 g/dL (ref 12.0–15.0)
Immature Granulocytes: 0 %
Lymphocytes Relative: 6 %
Lymphs Abs: 0.6 10*3/uL — ABNORMAL LOW (ref 0.7–4.0)
MCH: 30.2 pg (ref 26.0–34.0)
MCHC: 31.7 g/dL (ref 30.0–36.0)
MCV: 95.2 fL (ref 80.0–100.0)
Monocytes Absolute: 0.5 10*3/uL (ref 0.1–1.0)
Monocytes Relative: 5 %
Neutro Abs: 8.8 10*3/uL — ABNORMAL HIGH (ref 1.7–7.7)
Neutrophils Relative %: 86 %
Platelets: 190 10*3/uL (ref 150–400)
RBC: 4.17 MIL/uL (ref 3.87–5.11)
RDW: 13.3 % (ref 11.5–15.5)
WBC: 10.1 10*3/uL (ref 4.0–10.5)
nRBC: 0 % (ref 0.0–0.2)

## 2023-08-11 LAB — RESP PANEL BY RT-PCR (RSV, FLU A&B, COVID)  RVPGX2
Influenza A by PCR: NEGATIVE
Influenza B by PCR: NEGATIVE
Resp Syncytial Virus by PCR: NEGATIVE
SARS Coronavirus 2 by RT PCR: NEGATIVE

## 2023-08-11 LAB — LIPASE, BLOOD: Lipase: 41 U/L (ref 11–51)

## 2023-08-11 MED ORDER — FLUTICASONE FUROATE-VILANTEROL 200-25 MCG/ACT IN AEPB
1.0000 | INHALATION_SPRAY | Freq: Every day | RESPIRATORY_TRACT | Status: DC
  Administered 2023-08-12 – 2023-08-15 (×4): 1 via RESPIRATORY_TRACT
  Filled 2023-08-11: qty 28

## 2023-08-11 MED ORDER — DIPHENHYDRAMINE HCL 50 MG/ML IJ SOLN: 12.5000 mg | Freq: Four times a day (QID) | INTRAMUSCULAR | Status: DC | PRN

## 2023-08-11 MED ORDER — ONDANSETRON HCL 4 MG/2ML IJ SOLN
4.0000 mg | Freq: Four times a day (QID) | INTRAMUSCULAR | Status: DC | PRN
  Administered 2023-08-11: 4 mg via INTRAVENOUS
  Filled 2023-08-11: qty 2

## 2023-08-11 MED ORDER — SODIUM CHLORIDE 0.9 % IV SOLN
INTRAVENOUS | Status: AC
Start: 2023-08-11 — End: 2023-08-12

## 2023-08-11 MED ORDER — ALBUTEROL SULFATE (2.5 MG/3ML) 0.083% IN NEBU: 2.5000 mg | INHALATION_SOLUTION | Freq: Four times a day (QID) | RESPIRATORY_TRACT | Status: DC | PRN

## 2023-08-11 MED ORDER — SODIUM CHLORIDE 0.9% FLUSH
3.0000 mL | Freq: Two times a day (BID) | INTRAVENOUS | Status: DC
  Administered 2023-08-11 – 2023-08-15 (×8): 3 mL via INTRAVENOUS

## 2023-08-11 MED ORDER — ONDANSETRON HCL 4 MG/2ML IJ SOLN
4.0000 mg | Freq: Once | INTRAMUSCULAR | Status: AC
  Administered 2023-08-11: 4 mg via INTRAVENOUS
  Filled 2023-08-11: qty 2

## 2023-08-11 MED ORDER — UMECLIDINIUM BROMIDE 62.5 MCG/ACT IN AEPB
1.0000 | INHALATION_SPRAY | Freq: Every day | RESPIRATORY_TRACT | Status: DC
  Administered 2023-08-12 – 2023-08-15 (×4): 1 via RESPIRATORY_TRACT
  Filled 2023-08-11: qty 7

## 2023-08-11 MED ORDER — LEVALBUTEROL HCL 0.63 MG/3ML IN NEBU: 0.6300 mg | INHALATION_SOLUTION | Freq: Four times a day (QID) | RESPIRATORY_TRACT | Status: DC | PRN

## 2023-08-11 MED ORDER — HYDROMORPHONE HCL 1 MG/ML IJ SOLN
0.5000 mg | Freq: Once | INTRAMUSCULAR | Status: AC
  Administered 2023-08-11: 0.5 mg via INTRAVENOUS
  Filled 2023-08-11: qty 1

## 2023-08-11 MED ORDER — ACETAMINOPHEN 325 MG PO TABS
650.0000 mg | ORAL_TABLET | Freq: Four times a day (QID) | ORAL | Status: DC | PRN
  Administered 2023-08-13 – 2023-08-15 (×4): 650 mg via ORAL
  Filled 2023-08-11 (×4): qty 2

## 2023-08-11 MED ORDER — ACETAMINOPHEN 650 MG RE SUPP
650.0000 mg | Freq: Four times a day (QID) | RECTAL | Status: DC | PRN
  Administered 2023-08-12: 650 mg via RECTAL
  Filled 2023-08-11: qty 1

## 2023-08-11 MED ORDER — FENTANYL CITRATE PF 50 MCG/ML IJ SOSY
25.0000 ug | PREFILLED_SYRINGE | INTRAMUSCULAR | Status: DC | PRN
  Administered 2023-08-11: 25 ug via INTRAVENOUS
  Filled 2023-08-11: qty 1

## 2023-08-11 MED ORDER — PANTOPRAZOLE SODIUM 40 MG IV SOLR
40.0000 mg | INTRAVENOUS | Status: DC
Start: 2023-08-11 — End: 2023-08-15
  Administered 2023-08-11 – 2023-08-15 (×5): 40 mg via INTRAVENOUS
  Filled 2023-08-11 (×6): qty 10

## 2023-08-11 MED ORDER — HYDROMORPHONE HCL 1 MG/ML IJ SOLN
0.5000 mg | INTRAMUSCULAR | Status: DC | PRN
  Administered 2023-08-11 – 2023-08-15 (×12): 0.5 mg via INTRAVENOUS
  Filled 2023-08-11 (×4): qty 0.5
  Filled 2023-08-11 (×2): qty 1
  Filled 2023-08-11 (×8): qty 0.5

## 2023-08-11 MED ORDER — ONDANSETRON HCL 4 MG PO TABS: 4.0000 mg | ORAL_TABLET | Freq: Four times a day (QID) | ORAL | Status: DC | PRN

## 2023-08-11 MED ORDER — ENOXAPARIN SODIUM 40 MG/0.4ML IJ SOSY
40.0000 mg | PREFILLED_SYRINGE | INTRAMUSCULAR | Status: DC
  Administered 2023-08-11 – 2023-08-14 (×4): 40 mg via SUBCUTANEOUS
  Filled 2023-08-11 (×4): qty 0.4

## 2023-08-11 MED ORDER — LORAZEPAM 2 MG/ML IJ SOLN
0.5000 mg | Freq: Four times a day (QID) | INTRAMUSCULAR | Status: DC | PRN
  Administered 2023-08-11 – 2023-08-12 (×4): 0.5 mg via INTRAVENOUS
  Filled 2023-08-11 (×4): qty 1

## 2023-08-11 MED ORDER — DIATRIZOATE MEGLUMINE & SODIUM 66-10 % PO SOLN
90.0000 mL | Freq: Once | ORAL | Status: AC
Start: 2023-08-11 — End: 2023-08-11
  Administered 2023-08-11: 90 mL via NASOGASTRIC
  Filled 2023-08-11: qty 90

## 2023-08-11 NOTE — Plan of Care (Signed)
Pt admitted and educated on 6N and her plan of care.

## 2023-08-11 NOTE — ED Triage Notes (Signed)
Patient brought in by [facility]. Patient coming in for diarrhea x3 days but now constipated. Patient abdomen distended. Patient hx crohns and obstructions. Patient has tried suppository and miralax.

## 2023-08-11 NOTE — Progress Notes (Signed)
Pt admitted to 6N25 from the ED with NGT in place, hooked up to low intermittent suction at 80 max, Pt is alert and oriented x 4. Pain med given for pain score of 8.

## 2023-08-11 NOTE — ED Notes (Signed)
NG tube clamped per order for gastrografin

## 2023-08-11 NOTE — ED Provider Notes (Signed)
[facility]PT [facility] Emergency Department Provider Note MRN:  [id number]  Arrival date & time: 08/11/23     Chief Complaint   Shortness of Breath   History of Present Illness   Brittany Irwin is a 83 y.o. year-old female presents to the ED with chief complaint of abdominal pain.  Reports associated nausea, vomiting, and diarrhea.  She states that this is how she feels when she has an SBO.  Reports hx of the same 2/2 adhesions.  Reports worsening symptoms over the past few days.  Denies fevers.  She states that she has had cough and congestion.  History provided by patient.   Review of Systems  Pertinent positive and negative review of systems noted in HPI.    Physical Exam   Vitals:   08/11/23 0430 08/11/23 0445  BP: 100/88 137/61  Pulse: 62 (!) 59  Resp: 16 20  Temp:    SpO2: 99% 97%    CONSTITUTIONAL:  chronically ill-appearing, NAD NEURO:  Alert and oriented x 3, CN 3-12 grossly intact EYES:  eyes equal and reactive ENT/NECK:  Supple, no stridor  CARDIO:  normal rate, regular rhythm, appears well-perfused  PULM:  No respiratory distress, diminished GI/GU:  non-distended, generalized abdominal tenderness MSK/SPINE:  No gross deformities, no edema, moves all extremities  SKIN:  no rash, atraumatic   *Additional and/or pertinent findings included in MDM below  Diagnostic and Interventional Summary    EKG Interpretation Date/Time:    Ventricular Rate:    PR Interval:    QRS Duration:    QT Interval:    QTC Calculation:   R Axis:      Text Interpretation:         Labs Reviewed  COMPREHENSIVE METABOLIC PANEL - Abnormal; Notable for the following components:      Result Value   Glucose, Bld 141 (*)    BUN 24 (*)    Creatinine, Ser 1.12 (*)    Alkaline Phosphatase 34 (*)    GFR, Estimated 49 (*)    All other components within normal limits  CBC WITH DIFFERENTIAL/PLATELET - Abnormal; Notable for the following components:   Neutro Abs  8.8 (*)    Lymphs Abs 0.6 (*)    All other components within normal limits  RESP PANEL BY RT-PCR (RSV, FLU A&B, COVID)  [id number]  LIPASE, BLOOD  URINALYSIS, ROUTINE W REFLEX MICROSCOPIC    CT ABDOMEN PELVIS WO CONTRAST  Final Result    DG Chest Port 1 View  Final Result      Medications  HYDROmorphone (DILAUDID) injection 0.5 mg (0.5 mg Intravenous Given 08/11/23 0432)  ondansetron (ZOFRAN) injection 4 mg (4 mg Intravenous Given 08/11/23 0432)     Procedures  /  Critical Care Procedures  ED Course and Medical Decision Making  I have reviewed the triage vital signs, the nursing notes, and pertinent available records from the EMR.  Social Determinants Affecting Complexity of Care: Patient has no clinically significant social determinants affecting this chief complaint..   ED Course: Clinical Course as of 08/11/23 0613  Wed Aug 11, 2023  0612 CBC with Diff(!) No leukocytosis or anemia [RB]  0612 Comprehensive metabolic panel(!) Creatinine mildly elevated, no significant electrolyte abnormality [RB]  0612 Lipase, blood Lipase is normal, doubt pancreatitis. [RB]  [id number] Resp panel by RT-PCR (RSV, Flu A&B, Covid) Anterior Nasal Swab Covid and flu are negative. [RB]    Clinical Course User Index [RB] Roxy Horseman, PA-C    Medical Decision  Making Amount and/or Complexity of Data Reviewed Labs: ordered. Radiology: ordered.  Risk Prescription drug management. Decision regarding hospitalization.         Consultants: I consulted with Hospitalist, Dr. Julian Reil, who is appreciated for admitting.   Treatment and Plan: Patient's exam and diagnostic results are concerning for SBO.  Feel that patient will need admission to the hospital for further treatment and evaluation.  Patient seen by and discussed with attending physician, Dr. Blinda Leatherwood, who agrees with plan.  Final Clinical Impressions(s) / ED Diagnoses     ICD-10-CM   1. SBO (small bowel obstruction) (HCC)   K56.609       ED Discharge Orders     None         Discharge Instructions Discussed with and Provided to Patient:   Discharge Instructions   None      Roxy Horseman, PA-C 08/11/23 7425    Gilda Crease, MD 08/11/23 [id number]

## 2023-08-11 NOTE — ED Notes (Signed)
Pt requesting xanax prior to NGT placement, MD made aware.

## 2023-08-11 NOTE — Consult Note (Addendum)
CAYSIE KOLIS April 27, 1941  [id number].    Requesting MD: Katrinka Blazing, MD Chief Complaint/Reason for Consult: SBO  HPI:  Shaw Alice is an 83 y/o F with multiple medical problems including, but not limited to, CHF, tachy-brady syndrome s/p PPM, chronic respiratory failure on 3L home O2, back pain, Crohn's disease, and recurrent SBO who presents with abdominal pain. She tells me that about 2 weeks ago she started having diarrhea. At baseline she has loose BMs, about one daily, but for the last two weeks has had increased frequency of loose stools as well as near-incontinence of stool. In the last 2-3 days she has developed increased abdominal pain and bloating along with cessation of flatus and BMs. Other associated symptoms include fatigue. She has a history of Crohn's resulting in 3 abdominal surgeries for bowel resections. She also has remote history of open cholecystectomy. She tells me that her most recent operation was over 10 years ago. Since that time she has had admissions for SBO that were managed non-operatively, most recently in 2022 at [facility]. She also reports some episodes of SBO at home that resolved spontaneously. She denies being on any medications at present for her chron's. She tells me that she was on humira in the past but this was stopped due to side effects/interactions with other medications. She also reports taking methotrexate for psoriasis which was also stopped years ago due to liver toxicity. She denies use of blood thinners. Her sister, Bonita Quin, is at the bedside and lives 5 minutes down from the road from her. The patient tells me that she currently resides in an apartment for senior citizens. She is independent of ADLs at baseline and occasionally uses her walker. She has required home health services in the past after hospital admissions last year for CHF/cardiac conditions. She is a retired Child psychotherapist.   ROS: Review of Systems  All other systems reviewed and are  negative.   History reviewed. No pertinent family history.  Past Medical History:  Diagnosis Date   Acute CHF (congestive heart failure) (HCC) 01/02/2022   AKI (acute kidney injury) (HCC) 09/08/2020   Aspiration pneumonia (HCC) 01/05/2019   Atypical chest pain 03/11/2022   Bradycardia 03/14/2020   C. difficile colitis 04/03/2021   CHF (congestive heart failure) (HCC)    Coronary artery disease    Tachycardia 05/31/2013    Past Surgical History:  Procedure Laterality Date   ABDOMINAL HYSTERECTOMY     BOWEL RESECTION     CHOLECYSTECTOMY      Social History:  reports that she has quit smoking. Her smoking use included cigarettes. She does not have any smokeless tobacco history on file. She reports that she does not currently use alcohol. She reports that she does not use drugs.  Allergies:  Allergies  Allergen Reactions   Methylprednisolone Other (See Comments)    Increased BP, HR, agitation, hallucinations    Azulfidine [Sulfasalazine] Other (See Comments)    Headache    Epipen [Epinephrine] Hypertension   Flagyl [Metronidazole] Hives   Motrin [Ibuprofen] Nausea Only and Other (See Comments)    Told not to take medication due to GI distress caused by crohn's disease.   Nsaids Other (See Comments)    Told not to take NSAIDs due to GI distress caused by crohn's disease   Penicillins Hives   Morphine And Codeine Itching    Told not to take due to GI distress caused by crohn's disease    (Not in a hospital admission)  Physical Exam: Blood pressure 128/74, pulse 60, temperature 97.9 F (36.6 C), temperature source Oral, resp. rate 13, height 5\' 4"  (1.626 m), weight 68 kg, SpO2 98%. General: Chronically ill appearing elderly female. laying on hospital bed, appears stated age, NAD. HEENT: head -normocephalic, atraumatic; Eyes: PERRLA, no conjunctival injection; Ears- no external lesions or tenderness,  Neck- Trachea is midline CV- RRR, normal S1/S2, no M/R/G, no lower  extremity edema  Pulm- breathing is non-labored on nasal cannula  Abd- soft, mild to moderate distention, some tenderness in the epigastric and central abdominal region without rebound tenderness or guarding. Previous R subcostal incision and laparotomy incision consistent with known history of multiple bowel resection surgeries. No peritonitis.   NG in place R nare ~710mL bilious effluent in cannister. GU- deferred  MSK- UE/LE symmetrical, no cyanosis, clubbing, or edema. Neuro- non-focal exam Psych- Alert and Oriented x3 with appropriate affect Skin: warm and dry, no rashes or lesions   Results for orders placed or performed during the hospital encounter of 08/11/23 (from the past 48 hours)  Resp panel by RT-PCR (RSV, Flu A&B, Covid) Anterior Nasal Swab     Status: None   Collection Time: 08/11/23  4:16 AM   Specimen: Anterior Nasal Swab  Result Value Ref Range   SARS Coronavirus 2 by RT PCR NEGATIVE NEGATIVE   Influenza A by PCR NEGATIVE NEGATIVE   Influenza B by PCR NEGATIVE NEGATIVE    Comment: (NOTE) The Xpert Xpress SARS-CoV-2/FLU/RSV plus assay is intended as an aid in the diagnosis of influenza from Nasopharyngeal swab specimens and should not be used as a sole basis for treatment. Nasal washings and aspirates are unacceptable for Xpert Xpress SARS-CoV-2/FLU/RSV testing.  Fact Sheet for Patients: BloggerCourse.com  Fact Sheet for Healthcare Providers: SeriousBroker.it  This test is not yet approved or cleared by the [place] FDA and has been authorized for detection and/or diagnosis of SARS-CoV-2 by FDA under an Emergency Use Authorization (EUA). This EUA will remain in effect (meaning this test can be used) for the duration of the COVID-19 declaration under Section 564(b)(1) of the Act, 21 U.S.C. section 360bbb-3(b)(1), unless the authorization is terminated or revoked.     Resp Syncytial Virus by PCR NEGATIVE  NEGATIVE    Comment: (NOTE) Fact Sheet for Patients: BloggerCourse.com  Fact Sheet for Healthcare Providers: SeriousBroker.it  This test is not yet approved or cleared by the [place] FDA and has been authorized for detection and/or diagnosis of SARS-CoV-2 by FDA under an Emergency Use Authorization (EUA). This EUA will remain in effect (meaning this test can be used) for the duration of the COVID-19 declaration under Section 564(b)(1) of the Act, 21 U.S.C. section 360bbb-3(b)(1), unless the authorization is terminated or revoked.  Performed at [facility], [address], [place], [place] [address]   Comprehensive metabolic panel     Status: Abnormal   Collection Time: 08/11/23  4:26 AM  Result Value Ref Range   Sodium 139 135 - 145 mmol/L   Potassium 4.7 3.5 - 5.1 mmol/L   Chloride 99 98 - 111 mmol/L   CO2 28 22 - 32 mmol/L   Glucose, Bld 141 (H) 70 - 99 mg/dL    Comment: Glucose reference range applies only to samples taken after fasting for at least 8 hours.   BUN 24 (H) 8 - 23 mg/dL   Creatinine, Ser 2.13 (H) 0.44 - 1.00 mg/dL   Calcium 9.5 8.9 - 08.6 mg/dL   Total Protein 7.1  6.5 - 8.1 g/dL   Albumin 3.7 3.5 - 5.0 g/dL   AST 30 15 - 41 U/L   ALT 9 0 - 44 U/L   Alkaline Phosphatase 34 (L) 38 - 126 U/L   Total Bilirubin 0.9 0.0 - 1.2 mg/dL   GFR, Estimated 49 (L) >60 mL/min    Comment: (NOTE) Calculated using the CKD-EPI Creatinine Equation (2021)    Anion gap 12 5 - 15    Comment: Performed at [facility], [address], [place], [place] [address]  Lipase, blood     Status: None   Collection Time: 08/11/23  4:26 AM  Result Value Ref Range   Lipase 41 11 - 51 U/L    Comment: Performed at [facility], [address], [place], [place] [address]  CBC with Diff     Status: Abnormal   Collection Time: 08/11/23  4:26 AM  Result Value Ref Range   WBC 10.1 4.0 - 10.5 K/uL   RBC 4.17 3.87 - 5.11  MIL/uL   Hemoglobin 12.6 12.0 - 15.0 g/dL   HCT 84.1 32.4 - 40.1 %   MCV 95.2 80.0 - 100.0 fL   MCH 30.2 26.0 - 34.0 pg   MCHC 31.7 30.0 - 36.0 g/dL   RDW 02.7 25.3 - 66.4 %   Platelets 190 150 - 400 K/uL    Comment: REPEATED TO VERIFY   nRBC 0.0 0.0 - 0.2 %   Neutrophils Relative % 86 %   Neutro Abs 8.8 (H) 1.7 - 7.7 K/uL   Lymphocytes Relative 6 %   Lymphs Abs 0.6 (L) 0.7 - 4.0 K/uL   Monocytes Relative 5 %   Monocytes Absolute 0.5 0.1 - 1.0 K/uL   Eosinophils Relative 2 %   Eosinophils Absolute 0.2 0.0 - 0.5 K/uL   Basophils Relative 1 %   Basophils Absolute 0.1 0.0 - 0.1 K/uL   Immature Granulocytes 0 %   Abs Immature Granulocytes 0.03 0.00 - 0.07 K/uL    Comment: Performed at [facility], [address] [place], [place] [address]   DG Abd Portable 1V-Small Bowel Protocol-Position Verification Result Date: 08/11/2023 CLINICAL DATA:  83 year old female NG tube placement. EXAM: PORTABLE ABDOMEN - 1 VIEW COMPARISON:  CT Abdomen and Pelvis 0535 hours today. FINDINGS: Portable AP semi upright view at 0948 hours. Enteric tube placed into the epigastrium, side hole the level of the gastric body. Stable cholecystectomy clips. Stable visible bowel gas pattern. Cardiac pacemaker redemonstrated. Lung bases appear well aerated. IMPRESSION: Satisfactory enteric tube placement into the stomach. Electronically Signed   By: Odessa Fleming M.D.   On: 08/11/2023 10:04   CT ABDOMEN PELVIS WO CONTRAST Result Date: 08/11/2023 CLINICAL DATA:  3 day history of diarrhea followed by constipation. Abdominal distension. History of Crohn disease in previous bowel obstruction. EXAM: CT ABDOMEN AND PELVIS WITHOUT CONTRAST TECHNIQUE: Multidetector CT imaging of the abdomen and pelvis was performed following the standard protocol without IV contrast. RADIATION DOSE REDUCTION: This exam was performed according to the departmental dose-optimization program which includes automated exposure control, adjustment of the  mA and/or kV according to patient size and/or use of iterative reconstruction technique. COMPARISON:  01/02/2022 FINDINGS: Lower chest: Chronic atelectasis or scarring noted right lung base. Hepatobiliary: No suspicious focal abnormality in the liver on this study without intravenous contrast. Gallbladder is surgically absent. Stable prominence of the common bile duct, likely secondary to prior cholecystectomy. Pancreas: Pancreas is diffusely atrophic without main  duct dilatation. Spleen: No splenomegaly. No suspicious focal mass lesion. Adrenals/Urinary Tract: No adrenal nodule or mass. Exophytic cyst noted upper pole left kidney. 2.1 cm exophytic lesion posterior interpolar left kidney measures water density, compatible with a cyst. No followup imaging is recommended. 11 mm low-density cortical interpolar left renal lesion on 32/3 is stable in the interval, measuring close to water attenuation today. This is statistically most likely benign and also likely a cyst. No followup imaging is recommended. Right kidney unremarkable. No evidence for hydroureter. The urinary bladder appears normal for the degree of distention. Stomach/Bowel: Tiny hiatal hernia. Stomach otherwise unremarkable. Duodenum is normally positioned as is the ligament of Treitz. Patient is status post right hemicolectomy. Small bowel loops in the pelvis are fluid-filled and dilated up to 4.2 cm diameter. Subtending mesentery of these dilated small bowel loops is congested and edematous. There is a dilated loop of small bowel in the anterior right paramidline pelvis demonstrating fecalization of enteric contents with an apparent abrupt transition zone best seen on coronal 108/6. Small bowel loops in the right pelvis and the neo terminal ileum are relatively decompressed. Gas and stool are seen scattered along the nondilated colon. Vascular/Lymphatic: There is moderate atherosclerotic calcification of the abdominal aorta without aneurysm. There is no  gastrohepatic or hepatoduodenal ligament lymphadenopathy. No retroperitoneal or mesenteric lymphadenopathy. No pelvic sidewall lymphadenopathy. Reproductive: Hysterectomy.  There is no adnexal mass. Other: No intraperitoneal free fluid. Musculoskeletal: No worrisome lytic or sclerotic osseous abnormality. IMPRESSION: 1. Status post right hemicolectomy. Small bowel loops in the pelvis are fluid-filled and dilated up to 4.2 cm diameter. Subtending mesentery of these dilated small bowel loops is congested and edematous. There is a dilated loop of small bowel in the anterior right paramidline pelvis demonstrating fecalization of enteric contents with an apparent abrupt transition zone best seen on coronal 108/6. Imaging features are compatible with small bowel obstruction. 2. Tiny hiatal hernia. 3.  Aortic Atherosclerosis (ICD10-I70.0). Electronically Signed   By: Kennith Center M.D.   On: 08/11/2023 06:06   DG Chest Port 1 View Result Date: 08/11/2023 CLINICAL DATA:  83 year old female with cough and shortness of breath. EXAM: PORTABLE CHEST 1 VIEW COMPARISON:  Portable chest 10/14/2022 and earlier. FINDINGS: Portable AP semi upright view at 0426 hours. Rightward rotation similar to previous exam 01/27/2022. Stable cardiac and mediastinal contours since that time, with evidence of chronic cardiomegaly. Left chest dual lead cardiac pacemaker is new from last year. Stable lung volumes. Visualized tracheal air column is within normal limits. No pneumothorax or pulmonary edema. No pleural effusion or consolidation. No acute osseous abnormality identified. Negative visible bowel gas. IMPRESSION: Rightward rotated view. New dual lead left chest cardiac pacemaker since last year. No acute cardiopulmonary abnormality identified. Electronically Signed   By: Odessa Fleming M.D.   On: 08/11/2023 04:54      Assessment/Plan SBO, likely related to intra-abdominal adhesions 83 y/o F with history of Crohn's disease and multiple open  abdominal surgeries who presents with abdominal pain and cessation of flatus/BMs. CT scan of the abdomen W/O contrast is consistent with SBO. She is currently hemodynamically stable without leukocytosis or significant electrolyte derangements. No emergent role for surgery. Agree with NG tube decompression and SBO protocol which has already been started. Hopefully she will improve with non-oeprative measures. Given her MMP listed below she would likely be a high risk surgical candidate and would need cardiac, and possibly pulmonary, evaluations prior to surgical planning.    FEN - NPO, NG to  LIWS; ok for gum/hard candy for comfort along with ice chips VTE - SCD's. Lovenox ID - none currently indicated  Admit - TRH service   HTN Chronic respiratory failure, COPD - 3L O2 via [place] at home HCM, diastolic CHF  Tachybradycardia syndrome s/p PPM 02/2023  CKD 3 Anxiety   I reviewed nursing notes, ED provider notes, hospitalist notes, last 24 h vitals and pain scores, last 48 h intake and output, last 24 h labs and trends, and last 24 h imaging results.  Adam Phenix, PA-C [facility] 08/11/2023, 1:29 PM Please see Amion for pager number during day hours 7:00am-4:30pm or 7:00am -11:30am on weekends

## 2023-08-11 NOTE — H&P (Signed)
History and Physical    Patient: Brittany Irwin WGN:[id number] DOB: 07-06-1941 DOA: 08/11/2023 DOS: the patient was seen and examined on 08/11/2023 PCP: Teodoro Spray, MD  Patient coming from: Home  Chief Complaint:  Chief Complaint  Patient presents with   Shortness of Breath   HPI: Brittany Irwin is a 83 y.o. female with medical history significant of hypertension, chronic diastolic CHF, s/p PPM, COPD, chronic respiratory failure with hypoxia on 3 L nasal cannula at baseline, CKD 3A, small bowel obstruction, Crohn's disease, chronic back pain, anxiety, and anemia presented with complaints of abdominal pain.  The onset of these symptoms started two nights ago.  She notes having a history of having diarrhea and then being constipated leading to bowel obstructions the next. The patient's last bowel movement was the other night, which she described as a struggle. She attempted to manage the situation with a suppository and MiraLAX but these measures were unsuccessful this time.  Notes associated symptoms of nausea and vomiting  The patient has a history of multiple bowel obstructions, with the most recent episode occurring a couple of months ago that was able to be managed at home. She has had three abdominal surgeries in the past that she relates to her Crohn's disease.   The patient also has a history of congestive heart failure and has been advised to monitor her fluid intake. She also mentioned recent issues with her kidneys. The patient has COPD and a pacemaker, which makes her a high-risk candidate for surgery. Therefore, she prefers conservative management for her bowel obstructions unless it's a matter of life and death.  In the emergency department patient was noted to be afebrile with heart rate 59-74, no other vital signs maintained.  Labs noted WBC 10.1, BUN 24, creatinine 1.12, and glucose 141.  Chest x-ray noted new left chest cardiac pacemaker, but no other acute abnormality.   CT scan of the abdomen pelvis noted patient to be status post right hemicolectomy with concern for small bowel obstruction with a transition point in the anterior right paramidline pelvis.  Patient had been given Zofran and Dilaudid for pain.  review of Systems: As mentioned in the history of present illness. All other systems reviewed and are negative. Past Medical History:  Diagnosis Date   Acute CHF (congestive heart failure) (HCC) 01/02/2022   AKI (acute kidney injury) (HCC) 09/08/2020   Aspiration pneumonia (HCC) 01/05/2019   Atypical chest pain 03/11/2022   Bradycardia 03/14/2020   C. difficile colitis 04/03/2021   CHF (congestive heart failure) (HCC)    Coronary artery disease    Tachycardia 05/31/2013   Past Surgical History:  Procedure Laterality Date   ABDOMINAL HYSTERECTOMY     BOWEL RESECTION     CHOLECYSTECTOMY     Social History:  reports that she has quit smoking. Her smoking use included cigarettes. She does not have any smokeless tobacco history on file. She reports that she does not currently use alcohol. She reports that she does not use drugs.  Allergies  Allergen Reactions   Methylprednisolone Other (See Comments)    Increased BP, HR, agitation, hallucinations    Azulfidine [Sulfasalazine] Other (See Comments)    Headache    Epipen [Epinephrine] Hypertension   Flagyl [Metronidazole] Hives   Motrin [Ibuprofen] Nausea Only and Other (See Comments)    Told not to take medication due to GI distress caused by crohn's disease.   Nsaids Other (See Comments)    Told not to take NSAIDs due  to GI distress caused by crohn's disease   Penicillins Hives   Morphine And Codeine Itching    Told not to take due to GI distress caused by crohn's disease    History reviewed. No pertinent family history.  Prior to Admission medications   Medication Sig Start Date End Date Taking? Authorizing Provider  acetaminophen-codeine (TYLENOL #4) 300-60 MG tablet Take 1 tablet by  mouth 4 (four) times daily as needed for pain. 12/31/21  Yes [provider]  Aloe-Sodium Chloride (AYR SALINE NASAL GEL NA) Place 1 Application into the nose 2 (two) times daily as needed (Dry nose).   Yes [provider]  ALPRAZolam Prudy Feeler) 1 MG tablet Take 1 mg by mouth 3 (three) times daily as needed for anxiety. 01/02/22  Yes [provider]  atenolol (TENORMIN) 25 MG tablet Take half tablet (12.5 mg) by mouth daily. DO NOT take if heart rate is less than 60 BPM Patient taking differently: Take 25 mg by mouth daily. DO NOT take if heart rate is less than 60 BPM 10/21/22  Yes Robet Leu R, PA-C  bumetanide (BUMEX) 1 MG tablet Take 1 mg by mouth daily. 07/25/23  Yes [provider]  cyanocobalamin (,VITAMIN B-12,) 1000 MCG/ML injection Inject 1,000 mcg into the muscle every 30 (thirty) days.   Yes [provider]  cyclobenzaprine (FLEXERIL) 5 MG tablet Take 5 mg by mouth 2 (two) times daily as needed for muscle spasms. 10/13/22  Yes [provider]  diclofenac Sodium (VOLTAREN ARTHRITIS PAIN) 1 % GEL Apply 1 Application topically 2 (two) times daily as needed (back pain).   Yes [provider]  ergocalciferol (VITAMIN D2) 1.25 MG (50000 UT) capsule Take 50,000 Units by mouth every 14 (fourteen) days. Every other Friday   Yes [provider]  ezetimibe (ZETIA) 10 MG tablet Take 10 mg by mouth at bedtime. 12/31/21  Yes [provider]  fenofibrate micronized (LOFIBRA) 200 MG capsule Take 200 mg by mouth daily. 12/31/21  Yes [provider]  Fluticasone-Umeclidin-Vilant (TRELEGY ELLIPTA) 200-62.5-25 MCG/ACT AEPB Inhale 1 puff into the lungs daily. 08/31/22  Yes   levalbuterol (XOPENEX HFA) 45 MCG/ACT inhaler Inhale 2 puffs into the lungs every 6 (six) hours as needed for shortness of breath or wheezing. 12/23/21  Yes [provider]  losartan (COZAAR) 25 MG tablet Take 0.5 tablets (12.5 mg total) by mouth  daily. Patient taking differently: Take 25 mg by mouth daily. 10/21/22  Yes Jonita Albee, PA-C  Menthol, Topical Analgesic, (ICY HOT BACK EX) Apply 1 Application topically 2 (two) times daily as needed (back pain).   Yes [provider]  nitroGLYCERIN (NITROSTAT) 0.4 MG SL tablet Place 0.4 mg under the tongue every 5 (five) minutes x 3 doses as needed for chest pain. 12/31/21  Yes [provider]  omeprazole (PRILOSEC) 40 MG capsule Take 40 mg by mouth daily.   Yes [provider]  potassium chloride SA (KLOR-CON M) 20 MEQ tablet Take 20 mEq by mouth daily. 12/31/21  Yes [provider]  pregabalin (LYRICA) 50 MG capsule Take 50 mg by mouth 2 (two) times daily.   Yes [provider]  furosemide (LASIX) 20 MG tablet Take 60 mg by mouth in the morning and at bedtime. Patient not taking: Reported on 08/11/2023 12/31/21   [provider]  metolazone (ZAROXOLYN) 5 MG tablet Take 1 tablet (5 mg total) by mouth daily as needed (edema). Please take as needed in case of  weight gain 3 lbs in 24 to 48 hrs or 5 lbs in 7 days. Patient not taking: Reported on 08/11/2023 01/04/22   Arrien, York Ram, MD    Physical Exam: Vitals:   08/11/23 0430 08/11/23 0445 08/11/23 0500 08/11/23 0515  BP: 100/88 137/61 122/63 121/62  Pulse: 62 (!) 59 62 61  Resp: 16 20 13 13   Temp:      TempSrc:      SpO2: 99% 97% 99% 98%  Weight:      Height:       Constitutional: Elderly female currently in no acute distress Eyes: PERRL, lids and conjunctivae normal ENMT: Mucous membranes are dry.  Nasogastric tube in place clear secretions noted in collection. Neck: normal, supple  Respiratory: Decreased overall aeration with no significant wheezing appreciated.  Patient able to talk in complete sentences on 3 L of oxygen..  Cardiovascular: Regular rate and rhythm, no murmurs / rubs / gallops. No extremity edema.  Abdomen: Tenderness palpation of the abdomen appreciated.   Bowel sounds are decreased. Musculoskeletal: no clubbing / cyanosis. No joint deformity upper and lower extremities. Good ROM, no contractures. Normal muscle tone.  Skin: no rashes, lesions, ulcers. No induration Neurologic: CN 2-12 grossly intact.  Strength 5/5 in all 4.  Psychiatric: Normal judgment and insight. Alert and oriented x 3. Normal mood.   Data Reviewed:  EKG reveals sinus rhythm at 60 bpm with first-degree heart block.  Reviewed labs, imaging, and pertinent records as documented.  Assessment and Plan:  Small bowel obstruction History of bowel obstructions Patient presents with complaints of abdominal pain with nausea and vomiting.  CT imaging noted small bowel loops in the pelvis are fluid-filled and dilated up to 4.2 cm diameter, subtending mesentery of these dilated small bowel loops is congested and edematous, and dilated loop of small bowel in the anterior right paramidline pelvis demonstrating fecalization of enteric contents with an apparent abrupt transition zone.  History of prior bowel resection that she relates to her Crohn's disease, appendectomy, tubal ligation, and lysis of adhesions.  Patient had been given IV pain medication and IV fluids.  Patient not a great surgical candidate due to multiple comorbidities. -Admit to a medical telemetry bed -Small bowel order set utilized -Strict I&Os -N.p.o.  -NGT to suction -Check serial abdominal x-rays -Normal saline IV fluids at 50 mL/h -Dilaudid -Protonix IV -Antiemetics as needed -General Surgery consulted ,  will follow-up for any further recommendations  Essential hypertension Blood pressures are relatively maintained 100/88 to 150/81. -Hydralazine IV as needed for systolic blood pressure greater than 180 or diastolic blood pressure greater than 110  Chronic respiratory failure with hypoxia COPD, without acute exacerbation At baseline patient is on 3 L of oxygen.  No significant wheezing appreciated on  physical exam at this time.  O2 saturations currently maintained on home 3 L.  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  Patient appears to be euvolemic at this time.  Last echocardiogram noted EF to be 60 to 65% with grade 1 diastolic dysfunction when last checked 10/15/2022.  Small LV, but no LVOT. -Strict I&Os and daily weights  Anxiety -Ativan IV as needed anxiety  Tachybradycardia syndrome s/p PPM Patient had pacemaker placed 02/2023.  Crohn's disease Followed by Dr. Aline August for Crohn's disease which had been treated in the past with Humira  then MTX complicated by elevated LFTs and cirrhosis. -Continue outpatient follow-up with gastroenterology  Chronic kidney disease stage IIIa Acute.  Creatinine noted to be 1.12 with BUN 24, but appears around patient's baseline which appears to range from 1-1.3. -Continue to monitor  DVT prophylaxis: Lovenox  Advance Care Planning:   Code Status: Full Code    Consults: General Surgery Family Communication: Patient's sister updated at bedside  Severity of Illness: The appropriate patient status for this patient is INPATIENT. Inpatient status is judged to be reasonable and necessary in order to provide the required intensity of service to ensure the patient's safety. The patient's presenting symptoms, physical exam findings, and initial radiographic and laboratory data in the context of their chronic comorbidities is felt to place them at high risk for further clinical deterioration. Furthermore, it is not anticipated that the patient will be medically stable for discharge from the hospital within 2 midnights of admission.   * I certify that at the point of admission it is my clinical judgment that the patient will  require inpatient hospital care spanning beyond 2 midnights from the point of admission due to high intensity of service, high risk for further deterioration and high frequency of surveillance required.*  Author: Clydie Braun, MD 08/11/2023 7:40 AM  For on call review [email or web address].

## 2023-08-11 NOTE — ED Notes (Signed)
ED TO INPATIENT HANDOFF REPORT  ED Nurse Name and Phone #: Yancey Flemings, [phone]  S Name/Age/Gender Malachy Chamber 83 y.o. female Room/Bed: 040C/040C  Code Status   Code Status: Full Code  Home/SNF/Other Home Patient oriented to: self, place, time, and situation Is this baseline? Yes   Triage Complete: Triage complete  Chief Complaint SBO (small bowel obstruction) (HCC) [K56.609]  Triage Note Patient brought in by [facility]. Patient coming in for diarrhea x3 days but now constipated. Patient abdomen distended. Patient hx crohns and obstructions. Patient has tried suppository and miralax.    Allergies Allergies  Allergen Reactions   Methylprednisolone Other (See Comments)    Increased BP, HR, agitation, hallucinations    Azulfidine [Sulfasalazine] Other (See Comments)    Headache    Epipen [Epinephrine] Hypertension   Flagyl [Metronidazole] Hives   Motrin [Ibuprofen] Nausea Only and Other (See Comments)    Told not to take medication due to GI distress caused by crohn's disease.   Nsaids Other (See Comments)    Told not to take NSAIDs due to GI distress caused by crohn's disease   Penicillins Hives   Morphine And Codeine Itching    Told not to take due to GI distress caused by crohn's disease    Level of Care/Admitting Diagnosis ED Disposition     ED Disposition  Admit   Condition  --   Comment  Hospital Area: [facility] [[id number]]  Level of Care: Telemetry Medical [[id number]]  May admit patient to [facility] or [facility] if equivalent level of care is available:: No  Covid Evaluation: Asymptomatic - no recent exposure (last 10 days) testing not required  Diagnosis: SBO (small bowel obstruction) [facility]) [[id number]]  Admitting Physician: Clydie Braun [[id number]]  Attending Physician: Clydie Braun [[id number]]  Certification:: I certify this patient will need inpatient services for at least 2 midnights  Expected Medical Readiness:  08/13/2023          B Medical/Surgery History Past Medical History:  Diagnosis Date   Acute CHF (congestive heart failure) (HCC) 01/02/2022   AKI (acute kidney injury) (HCC) 09/08/2020   Aspiration pneumonia (HCC) 01/05/2019   Atypical chest pain 03/11/2022   Bradycardia 03/14/2020   C. difficile colitis 04/03/2021   CHF (congestive heart failure) (HCC)    Coronary artery disease    Tachycardia 05/31/2013   Past Surgical History:  Procedure Laterality Date   ABDOMINAL HYSTERECTOMY     BOWEL RESECTION     CHOLECYSTECTOMY       A IV Location/Drains/Wounds Patient Lines/Drains/Airways Status     Active Line/Drains/Airways     Name Placement date Placement time Site Days   Peripheral IV 08/11/23 22 G Right Hand 08/11/23  0619  Hand  less than 1   NG/OG Vented/Dual Lumen 16 Fr. Right nare 62 cm 08/11/23  0934  Right nare  less than 1            Intake/Output Last 24 hours  Intake/Output Summary (Last 24 hours) at 08/11/2023 1412 Last data filed at 08/11/2023 4401 Gross per 24 hour  Intake --  Output 75 ml  Net -75 ml    Labs/Imaging Results for orders placed or performed during the hospital encounter of 08/11/23 (from the past 48 hours)  Resp panel by RT-PCR (RSV, Flu A&B, Covid) Anterior Nasal Swab     Status: None   Collection Time: 08/11/23  4:16 AM   Specimen: Anterior Nasal Swab  Result Value Ref  Range   SARS Coronavirus 2 by RT PCR NEGATIVE NEGATIVE   Influenza A by PCR NEGATIVE NEGATIVE   Influenza B by PCR NEGATIVE NEGATIVE    Comment: (NOTE) The Xpert Xpress SARS-CoV-2/FLU/RSV plus assay is intended as an aid in the diagnosis of influenza from Nasopharyngeal swab specimens and should not be used as a sole basis for treatment. Nasal washings and aspirates are unacceptable for Xpert Xpress SARS-CoV-2/FLU/RSV testing.  Fact Sheet for Patients: BloggerCourse.com  Fact Sheet for Healthcare  Providers: SeriousBroker.it  This test is not yet approved or cleared by the [place] FDA and has been authorized for detection and/or diagnosis of SARS-CoV-2 by FDA under an Emergency Use Authorization (EUA). This EUA will remain in effect (meaning this test can be used) for the duration of the COVID-19 declaration under Section 564(b)(1) of the Act, 21 U.S.C. section 360bbb-3(b)(1), unless the authorization is terminated or revoked.     Resp Syncytial Virus by PCR NEGATIVE NEGATIVE    Comment: (NOTE) Fact Sheet for Patients: BloggerCourse.com  Fact Sheet for Healthcare Providers: SeriousBroker.it  This test is not yet approved or cleared by the [place] FDA and has been authorized for detection and/or diagnosis of SARS-CoV-2 by FDA under an Emergency Use Authorization (EUA). This EUA will remain in effect (meaning this test can be used) for the duration of the COVID-19 declaration under Section 564(b)(1) of the Act, 21 U.S.C. section 360bbb-3(b)(1), unless the authorization is terminated or revoked.  Performed at [facility], [address], [place], [place] [address]   Comprehensive metabolic panel     Status: Abnormal   Collection Time: 08/11/23  4:26 AM  Result Value Ref Range   Sodium 139 135 - 145 mmol/L   Potassium 4.7 3.5 - 5.1 mmol/L   Chloride 99 98 - 111 mmol/L   CO2 28 22 - 32 mmol/L   Glucose, Bld 141 (H) 70 - 99 mg/dL    Comment: Glucose reference range applies only to samples taken after fasting for at least 8 hours.   BUN 24 (H) 8 - 23 mg/dL   Creatinine, Ser 4.33 (H) 0.44 - 1.00 mg/dL   Calcium 9.5 8.9 - 29.5 mg/dL   Total Protein 7.1 6.5 - 8.1 g/dL   Albumin 3.7 3.5 - 5.0 g/dL   AST 30 15 - 41 U/L   ALT 9 0 - 44 U/L   Alkaline Phosphatase 34 (L) 38 - 126 U/L   Total Bilirubin 0.9 0.0 - 1.2 mg/dL   GFR, Estimated 49 (L) >60 mL/min    Comment:  (NOTE) Calculated using the CKD-EPI Creatinine Equation (2021)    Anion gap 12 5 - 15    Comment: Performed at [facility], [address] [place], [place] [address]  Lipase, blood     Status: None   Collection Time: 08/11/23  4:26 AM  Result Value Ref Range   Lipase 41 11 - 51 U/L    Comment: Performed at [facility], [address], [place], [place] [address]  CBC with Diff     Status: Abnormal   Collection Time: 08/11/23  4:26 AM  Result Value Ref Range   WBC 10.1 4.0 - 10.5 K/uL   RBC 4.17 3.87 - 5.11 MIL/uL   Hemoglobin 12.6 12.0 - 15.0 g/dL   HCT 01.6 01.0 - 93.2 %   MCV 95.2 80.0 - 100.0 fL   MCH 30.2 26.0 - 34.0 pg   MCHC 31.7 30.0 -  36.0 g/dL   RDW 57.8 46.9 - 62.9 %   Platelets 190 150 - 400 K/uL    Comment: REPEATED TO VERIFY   nRBC 0.0 0.0 - 0.2 %   Neutrophils Relative % 86 %   Neutro Abs 8.8 (H) 1.7 - 7.7 K/uL   Lymphocytes Relative 6 %   Lymphs Abs 0.6 (L) 0.7 - 4.0 K/uL   Monocytes Relative 5 %   Monocytes Absolute 0.5 0.1 - 1.0 K/uL   Eosinophils Relative 2 %   Eosinophils Absolute 0.2 0.0 - 0.5 K/uL   Basophils Relative 1 %   Basophils Absolute 0.1 0.0 - 0.1 K/uL   Immature Granulocytes 0 %   Abs Immature Granulocytes 0.03 0.00 - 0.07 K/uL    Comment: Performed at [facility], [address], [place], [place] [address]   DG Abd Portable 1V-Small Bowel Protocol-Position Verification Result Date: 08/11/2023 CLINICAL DATA:  83 year old female NG tube placement. EXAM: PORTABLE ABDOMEN - 1 VIEW COMPARISON:  CT Abdomen and Pelvis 0535 hours today. FINDINGS: Portable AP semi upright view at 0948 hours. Enteric tube placed into the epigastrium, side hole the level of the gastric body. Stable cholecystectomy clips. Stable visible bowel gas pattern. Cardiac pacemaker redemonstrated. Lung bases appear well aerated. IMPRESSION: Satisfactory enteric tube placement into the stomach. Electronically Signed   By: Odessa Fleming M.D.   On: 08/11/2023 10:04   CT  ABDOMEN PELVIS WO CONTRAST Result Date: 08/11/2023 CLINICAL DATA:  3 day history of diarrhea followed by constipation. Abdominal distension. History of Crohn disease in previous bowel obstruction. EXAM: CT ABDOMEN AND PELVIS WITHOUT CONTRAST TECHNIQUE: Multidetector CT imaging of the abdomen and pelvis was performed following the standard protocol without IV contrast. RADIATION DOSE REDUCTION: This exam was performed according to the departmental dose-optimization program which includes automated exposure control, adjustment of the mA and/or kV according to patient size and/or use of iterative reconstruction technique. COMPARISON:  01/02/2022 FINDINGS: Lower chest: Chronic atelectasis or scarring noted right lung base. Hepatobiliary: No suspicious focal abnormality in the liver on this study without intravenous contrast. Gallbladder is surgically absent. Stable prominence of the common bile duct, likely secondary to prior cholecystectomy. Pancreas: Pancreas is diffusely atrophic without main duct dilatation. Spleen: No splenomegaly. No suspicious focal mass lesion. Adrenals/Urinary Tract: No adrenal nodule or mass. Exophytic cyst noted upper pole left kidney. 2.1 cm exophytic lesion posterior interpolar left kidney measures water density, compatible with a cyst. No followup imaging is recommended. 11 mm low-density cortical interpolar left renal lesion on 32/3 is stable in the interval, measuring close to water attenuation today. This is statistically most likely benign and also likely a cyst. No followup imaging is recommended. Right kidney unremarkable. No evidence for hydroureter. The urinary bladder appears normal for the degree of distention. Stomach/Bowel: Tiny hiatal hernia. Stomach otherwise unremarkable. Duodenum is normally positioned as is the ligament of Treitz. Patient is status post right hemicolectomy. Small bowel loops in the pelvis are fluid-filled and dilated up to 4.2 cm diameter. Subtending  mesentery of these dilated small bowel loops is congested and edematous. There is a dilated loop of small bowel in the anterior right paramidline pelvis demonstrating fecalization of enteric contents with an apparent abrupt transition zone best seen on coronal 108/6. Small bowel loops in the right pelvis and the neo terminal ileum are relatively decompressed. Gas and stool are seen scattered along the nondilated colon. Vascular/Lymphatic: There is moderate atherosclerotic calcification of the abdominal aorta without aneurysm. There is  no gastrohepatic or hepatoduodenal ligament lymphadenopathy. No retroperitoneal or mesenteric lymphadenopathy. No pelvic sidewall lymphadenopathy. Reproductive: Hysterectomy.  There is no adnexal mass. Other: No intraperitoneal free fluid. Musculoskeletal: No worrisome lytic or sclerotic osseous abnormality. IMPRESSION: 1. Status post right hemicolectomy. Small bowel loops in the pelvis are fluid-filled and dilated up to 4.2 cm diameter. Subtending mesentery of these dilated small bowel loops is congested and edematous. There is a dilated loop of small bowel in the anterior right paramidline pelvis demonstrating fecalization of enteric contents with an apparent abrupt transition zone best seen on coronal 108/6. Imaging features are compatible with small bowel obstruction. 2. Tiny hiatal hernia. 3.  Aortic Atherosclerosis (ICD10-I70.0). Electronically Signed   By: Kennith Center M.D.   On: 08/11/2023 06:06   DG Chest Port 1 View Result Date: 08/11/2023 CLINICAL DATA:  83 year old female with cough and shortness of breath. EXAM: PORTABLE CHEST 1 VIEW COMPARISON:  Portable chest 10/14/2022 and earlier. FINDINGS: Portable AP semi upright view at 0426 hours. Rightward rotation similar to previous exam 01/27/2022. Stable cardiac and mediastinal contours since that time, with evidence of chronic cardiomegaly. Left chest dual lead cardiac pacemaker is new from last year. Stable lung volumes.  Visualized tracheal air column is within normal limits. No pneumothorax or pulmonary edema. No pleural effusion or consolidation. No acute osseous abnormality identified. Negative visible bowel gas. IMPRESSION: Rightward rotated view. New dual lead left chest cardiac pacemaker since last year. No acute cardiopulmonary abnormality identified. Electronically Signed   By: Odessa Fleming M.D.   On: 08/11/2023 04:54    Pending Labs Unresulted Labs (From admission, onward)     Start     Ordered   08/12/23 0500  CBC  Tomorrow morning,   R        08/11/23 0757   08/12/23 0500  Comprehensive metabolic panel  Tomorrow morning,   R        08/11/23 0757   08/11/23 0416  Urinalysis, Routine w reflex microscopic -Urine, Clean Catch  Once,   URGENT       Question:  Specimen Source  Answer:  Urine, Clean Catch   08/11/23 0416            Vitals/Pain Today's Vitals   08/11/23 1145 08/11/23 1200 08/11/23 1215 08/11/23 1227  BP: 139/87 134/83 128/74   Pulse: 65 (!) 59 60   Resp: 18 12 13    Temp:    97.9 F (36.6 C)  TempSrc:    Oral  SpO2: 100% 100% 98%   Weight:      Height:      PainSc:        Isolation Precautions No active isolations  Medications Medications  enoxaparin (LOVENOX) injection 40 mg (has no administration in time range)  sodium chloride flush (NS) 0.9 % injection 3 mL (3 mLs Intravenous Given 08/11/23 1022)  acetaminophen (TYLENOL) tablet 650 mg (has no administration in time range)    Or  acetaminophen (TYLENOL) suppository 650 mg (has no administration in time range)  ondansetron (ZOFRAN) tablet 4 mg (has no administration in time range)    Or  ondansetron (ZOFRAN) injection 4 mg (has no administration in time range)  0.9 %  sodium chloride infusion ( Intravenous New Bag/Given 08/11/23 0833)  diphenhydrAMINE (BENADRYL) injection 12.5 mg (has no administration in time range)  LORazepam (ATIVAN) injection 0.5 mg (0.5 mg Intravenous Given 08/11/23 0919)  HYDROmorphone (DILAUDID)  injection 0.5 mg (0.5 mg Intravenous Given 08/11/23 1218)  pantoprazole (PROTONIX) injection  40 mg (40 mg Intravenous Given 08/11/23 1217)  fluticasone furoate-vilanterol (BREO ELLIPTA) 200-25 MCG/ACT 1 puff (has no administration in time range)    And  umeclidinium bromide (INCRUSE ELLIPTA) 62.5 MCG/ACT 1 puff (has no administration in time range)  levalbuterol (XOPENEX) nebulizer solution 0.63 mg (has no administration in time range)  HYDROmorphone (DILAUDID) injection 0.5 mg (0.5 mg Intravenous Given 08/11/23 0432)  ondansetron (ZOFRAN) injection 4 mg (4 mg Intravenous Given 08/11/23 0432)  diatrizoate meglumine-sodium (GASTROGRAFIN) 66-10 % solution 90 mL (90 mLs Per NG tube Given 08/11/23 1017)    Mobility walks with person assist     Focused Assessments Pulmonary Assessment Handoff:  Lung sounds:   O2 Device: Nasal Cannula O2 Flow Rate (L/min): 3 L/min (baseline)    R Recommendations: See Admitting Provider Note  Report given to:   Additional Notes:

## 2023-08-12 ENCOUNTER — Inpatient Hospital Stay (HOSPITAL_COMMUNITY): Payer: Medicare Other

## 2023-08-12 DIAGNOSIS — K56609 Unspecified intestinal obstruction, unspecified as to partial versus complete obstruction: Secondary | ICD-10-CM | POA: Diagnosis not present

## 2023-08-12 LAB — CBC
HCT: 38.6 % (ref 36.0–46.0)
Hemoglobin: 12.3 g/dL (ref 12.0–15.0)
MCH: 30.2 pg (ref 26.0–34.0)
MCHC: 31.9 g/dL (ref 30.0–36.0)
MCV: 94.8 fL (ref 80.0–100.0)
Platelets: 205 10*3/uL (ref 150–400)
RBC: 4.07 MIL/uL (ref 3.87–5.11)
RDW: 13.2 % (ref 11.5–15.5)
WBC: 6.6 10*3/uL (ref 4.0–10.5)
nRBC: 0 % (ref 0.0–0.2)

## 2023-08-12 LAB — COMPREHENSIVE METABOLIC PANEL
ALT: 8 U/L (ref 0–44)
AST: 20 U/L (ref 15–41)
Albumin: 3.5 g/dL (ref 3.5–5.0)
Alkaline Phosphatase: 35 U/L — ABNORMAL LOW (ref 38–126)
Anion gap: 8 (ref 5–15)
BUN: 23 mg/dL (ref 8–23)
CO2: 26 mmol/L (ref 22–32)
Calcium: 9.1 mg/dL (ref 8.9–10.3)
Chloride: 106 mmol/L (ref 98–111)
Creatinine, Ser: 1.11 mg/dL — ABNORMAL HIGH (ref 0.44–1.00)
GFR, Estimated: 50 mL/min — ABNORMAL LOW (ref >60)
Glucose, Bld: 104 mg/dL — ABNORMAL HIGH (ref 70–99)
Potassium: 4.3 mmol/L (ref 3.5–5.1)
Sodium: 140 mmol/L (ref 135–145)
Total Bilirubin: 1.2 mg/dL (ref 0.0–1.2)
Total Protein: 7 g/dL (ref 6.5–8.1)

## 2023-08-12 MED ORDER — LORATADINE 10 MG PO TABS
10.0000 mg | ORAL_TABLET | Freq: Every day | ORAL | Status: DC
  Administered 2023-08-12 – 2023-08-15 (×4): 10 mg via ORAL
  Filled 2023-08-12 (×4): qty 1

## 2023-08-12 MED ORDER — PHENOL 1.4 % MT LIQD
1.0000 | OROMUCOSAL | Status: DC | PRN
  Administered 2023-08-12: 1 via OROMUCOSAL
  Filled 2023-08-12: qty 177

## 2023-08-12 NOTE — Progress Notes (Signed)
Progress Note     Subjective: Has had Bms and passing some flatus. No nausea or emesis with NGT. Bloating improved. Pain improved but still present and she states obstruction does not feel completely resolved as it has in the past with her numerous prior SBOs   Objective: Vital signs in last 24 hours: Temp:  [97.8 F (36.6 C)-98.5 F (36.9 C)] 97.8 F (36.6 C) (01/23 0904) Pulse Rate:  [59-66] 66 (01/23 0910) Resp:  [11-18] 17 (01/23 0910) BP: (109-157)/(51-87) 118/55 (01/23 0904) SpO2:  [95 %-100 %] 96 % (01/23 0910)    Intake/Output from previous day: 01/22 0701 - 01/23 0700 In: -  Out: 250 [Emesis/NG output:250] Intake/Output this shift: No intake/output data recorded.  PE: General: pleasant, WD, female who is laying in bed in NAD Lungs: Respiratory effort nonlabored on room air Abd: soft, minimal distension. Mild TTP epigastrium. Well healed surgical scars. NGT with thin bilious output MSK: all 4 extremities are symmetrical with no cyanosis, clubbing, or edema. Skin: warm and dry Psych: A&Ox3 with an appropriate affect.    Lab Results:  Recent Labs    08/11/23 0426 08/12/23 0631  WBC 10.1 6.6  HGB 12.6 12.3  HCT 39.7 38.6  PLT 190 205   BMET Recent Labs    08/11/23 0426 08/12/23 0631  NA 139 140  K 4.7 4.3  CL 99 106  CO2 28 26  GLUCOSE 141* 104*  BUN 24* 23  CREATININE 1.12* 1.11*  CALCIUM 9.5 9.1   PT/INR No results for input(s): "LABPROT", "INR" in the last 72 hours. CMP     Component Value Date/Time   NA 140 08/12/2023 0631   NA 140 10/21/2022 1633   K 4.3 08/12/2023 0631   CL 106 08/12/2023 0631   CO2 26 08/12/2023 0631   GLUCOSE 104 (H) 08/12/2023 0631   BUN 23 08/12/2023 0631   BUN 35 (H) 10/21/2022 1633   CREATININE 1.11 (H) 08/12/2023 0631   CALCIUM 9.1 08/12/2023 0631   PROT 7.0 08/12/2023 0631   ALBUMIN 3.5 08/12/2023 0631   AST 20 08/12/2023 0631   ALT 8 08/12/2023 0631   ALKPHOS 35 (L) 08/12/2023 0631   BILITOT 1.2  08/12/2023 0631   GFRNONAA 50 (L) 08/12/2023 0631   Lipase     Component Value Date/Time   LIPASE 41 08/11/2023 0426       Studies/Results: DG Abd Portable 1V-Small Bowel Obstruction Protocol-initial, 8 hr delay Result Date: 08/11/2023 CLINICAL DATA:  Small bowel obstruction.  8 hour delay. EXAM: PORTABLE ABDOMEN - 1 VIEW COMPARISON:  CT and plain films earlier today. FINDINGS: Oral contrast material is seen within the colon. No visible dilated small bowel currently to suggest small bowel obstruction. NG tube is in the stomach. Prior cholecystectomy. No organomegaly or free air. IMPRESSION: NG tube in the stomach. Contrast has passed into the colon. No dilated small bowel currently to suggest small bowel obstruction. Electronically Signed   By: Charlett Nose M.D.   On: 08/11/2023 18:38   DG Abd Portable 1V-Small Bowel Protocol-Position Verification Result Date: 08/11/2023 CLINICAL DATA:  83 year old female NG tube placement. EXAM: PORTABLE ABDOMEN - 1 VIEW COMPARISON:  CT Abdomen and Pelvis 0535 hours today. FINDINGS: Portable AP semi upright view at 0948 hours. Enteric tube placed into the epigastrium, side hole the level of the gastric body. Stable cholecystectomy clips. Stable visible bowel gas pattern. Cardiac pacemaker redemonstrated. Lung bases appear well aerated. IMPRESSION: Satisfactory enteric tube placement into the stomach. Electronically  Signed   By: Odessa Fleming M.D.   On: 08/11/2023 10:04   CT ABDOMEN PELVIS WO CONTRAST Result Date: 08/11/2023 CLINICAL DATA:  3 day history of diarrhea followed by constipation. Abdominal distension. History of Crohn disease in previous bowel obstruction. EXAM: CT ABDOMEN AND PELVIS WITHOUT CONTRAST TECHNIQUE: Multidetector CT imaging of the abdomen and pelvis was performed following the standard protocol without IV contrast. RADIATION DOSE REDUCTION: This exam was performed according to the departmental dose-optimization program which includes automated  exposure control, adjustment of the mA and/or kV according to patient size and/or use of iterative reconstruction technique. COMPARISON:  01/02/2022 FINDINGS: Lower chest: Chronic atelectasis or scarring noted right lung base. Hepatobiliary: No suspicious focal abnormality in the liver on this study without intravenous contrast. Gallbladder is surgically absent. Stable prominence of the common bile duct, likely secondary to prior cholecystectomy. Pancreas: Pancreas is diffusely atrophic without main duct dilatation. Spleen: No splenomegaly. No suspicious focal mass lesion. Adrenals/Urinary Tract: No adrenal nodule or mass. Exophytic cyst noted upper pole left kidney. 2.1 cm exophytic lesion posterior interpolar left kidney measures water density, compatible with a cyst. No followup imaging is recommended. 11 mm low-density cortical interpolar left renal lesion on 32/3 is stable in the interval, measuring close to water attenuation today. This is statistically most likely benign and also likely a cyst. No followup imaging is recommended. Right kidney unremarkable. No evidence for hydroureter. The urinary bladder appears normal for the degree of distention. Stomach/Bowel: Tiny hiatal hernia. Stomach otherwise unremarkable. Duodenum is normally positioned as is the ligament of Treitz. Patient is status post right hemicolectomy. Small bowel loops in the pelvis are fluid-filled and dilated up to 4.2 cm diameter. Subtending mesentery of these dilated small bowel loops is congested and edematous. There is a dilated loop of small bowel in the anterior right paramidline pelvis demonstrating fecalization of enteric contents with an apparent abrupt transition zone best seen on coronal 108/6. Small bowel loops in the right pelvis and the neo terminal ileum are relatively decompressed. Gas and stool are seen scattered along the nondilated colon. Vascular/Lymphatic: There is moderate atherosclerotic calcification of the abdominal  aorta without aneurysm. There is no gastrohepatic or hepatoduodenal ligament lymphadenopathy. No retroperitoneal or mesenteric lymphadenopathy. No pelvic sidewall lymphadenopathy. Reproductive: Hysterectomy.  There is no adnexal mass. Other: No intraperitoneal free fluid. Musculoskeletal: No worrisome lytic or sclerotic osseous abnormality. IMPRESSION: 1. Status post right hemicolectomy. Small bowel loops in the pelvis are fluid-filled and dilated up to 4.2 cm diameter. Subtending mesentery of these dilated small bowel loops is congested and edematous. There is a dilated loop of small bowel in the anterior right paramidline pelvis demonstrating fecalization of enteric contents with an apparent abrupt transition zone best seen on coronal 108/6. Imaging features are compatible with small bowel obstruction. 2. Tiny hiatal hernia. 3.  Aortic Atherosclerosis (ICD10-I70.0). Electronically Signed   By: Kennith Center M.D.   On: 08/11/2023 06:06   DG Chest Port 1 View Result Date: 08/11/2023 CLINICAL DATA:  83 year old female with cough and shortness of breath. EXAM: PORTABLE CHEST 1 VIEW COMPARISON:  Portable chest 10/14/2022 and earlier. FINDINGS: Portable AP semi upright view at 0426 hours. Rightward rotation similar to previous exam 01/27/2022. Stable cardiac and mediastinal contours since that time, with evidence of chronic cardiomegaly. Left chest dual lead cardiac pacemaker is new from last year. Stable lung volumes. Visualized tracheal air column is within normal limits. No pneumothorax or pulmonary edema. No pleural effusion or consolidation. No acute osseous  abnormality identified. Negative visible bowel gas. IMPRESSION: Rightward rotated view. New dual lead left chest cardiac pacemaker since last year. No acute cardiopulmonary abnormality identified. Electronically Signed   By: Odessa Fleming M.D.   On: 08/11/2023 04:54    Anti-infectives: Anti-infectives (From admission, onward)    None         Assessment/Plan  SBO, likely related to intra-abdominal adhesions    - follow up xrays with contrast in colon and improved small bowel dilatation - having bowel movements - having ongoing abdominal pain so instead of removal will clamp NGT and trial clears. Resume LIWS if n/v/abd pain with clears. If LIWS resumed will need to advance NGT - NGT out or resume LIWS this afternoon  FEN - clamp NGT, clears VTE - SCD's. 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    LOS: 1 day   Eric Form, [facility] 08/12/2023, 9:50 AM Please see Amion for pager number during day hours 7:00am-4:30pm

## 2023-08-12 NOTE — Progress Notes (Signed)
PROGRESS NOTE  Brittany Irwin  DOB: 1941-01-31  PCP: Teodoro Spray, MD HYQ:[id number]  DOA: 08/11/2023  LOS: 1 day  Hospital Day: 2  Brief narrative: Brittany Irwin is a 83 y.o. female with PMH significant for HTN, CAD, CHF, tachybradycardia syndrome s/p PPM, CKD, COPD, chronic respiratory failure with hypoxia on 3 L nasal cannula at baseline, chronic back pain, anxiety, Crohn's disease status post multiple surgeries leading to recurrent SBO. Most recent surgery 10 years ago.  Most recent SBO 2022. At baseline, patient is independent on ADLs and occasionally uses walker. 1/22, patient presented to the ED with complaint of abdominal pain, bloating along with cessation of flatus and BMs for 2 to 3 days.   In the ED, vital signs stable Routine labs stable CT imaging showed small bowel loops in the pelvis are fluid-filled and dilated up to 4.2 cm diameter, subtending mesentery of these dilated small bowel loops is congested and edematous, and dilated loop of small bowel in the anterior right paramidline pelvis demonstrating fecalization of enteric contents with an apparent abrupt transition zone.   Patient had been given Zofran and Dilaudid for pain. Admitted to [facility] General Surgery was consulted Started on conservative management  Subjective: Patient was seen and examined this afternoon.  Pleasant elderly Caucasian female.  Sitting up in commode.  Trying to have bowel movement.  On 3 L oxygen by nasal cannula.  No family at bedside. Also complained of last few days of sinus congestion, low-grade fever and greenish mucus Chart reviewed Remains hemodynamically stable Labs repeated this morning mostly unremarkable Underwent abdominal series x-rays  Assessment and plan: Small bowel obstruction H/o Crohn's disease s/p multiple surgeries --> recommended p.o. Presented with abdominal pain, nausea, vomiting  CT scan of the abdomen pelvis raised concern for small bowel obstruction with  a transition point in the anterior right paramidline pelvis.   Currently on conservative management per general surgery recommendation Follow-up abdominal series x-ray today shows contrast in colon and improved SBO Noted a plan to clamp NG tube and start clear liquid diet As needed IV antiemetics   Chronic respiratory failure with hypoxia COPD Uses 3 L O2 at baseline  COPD stable currently  Continue bronchodilators   Chronic diastolic CHF HTN Most recent echo from March 2024 with EF 60 to 65%, G1 DD PTA meds- Bumex 1 mg daily, losartan 25 mg daily. 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  Resume this afternoon  CAD HLD Has history of CAD.  Patient denies any stent or bypass in the past.  Not on any blood thinner. PTA meds- Zetia 10 mg daily, fenofibrate 200 mg daily. Resume when oral intake is insured.  CKD 3a Creatinine stable Recent Labs    10/14/22 1100 10/14/22 1122 10/15/22 0028 10/17/22 0751 10/21/22 1633 08/11/23 0426 08/12/23 0631  BUN 36* 34* 33* 26* 35* 24* 23  CREATININE 1.27* 1.30* 1.32* 1.07* 0.98 1.12* 1.11*     Crohn's disease Followed by Dr. Aline August for Crohn's disease which had been treated in the past with Humira then MTX complicated by elevated LFTs Continue outpatient follow-up with gastroenterology   Chronic back pain Flexeril as needed  Anxiety Ativan a 1 mg 3 times daily as needed  GERD PPI  Acute sinusitis Also complained of last few days of sinus congestion, low-grade fever and greenish mucus Patient reports he had improvement the past with Zyrtec.  Start Claritin. If develops fever or leukocytosis, may need  antibiotic treatment as well.    Mobility: Encourage ambulation  Goals of care   Code Status: Full Code     DVT prophylaxis:  enoxaparin (LOVENOX) injection 40 mg Start: 08/11/23 1800   Antimicrobials: None Fluid: None currently Consultants: General Surgery Family  Communication: None at bedside  Status: Inpatient Level of care:  Telemetry Medical   Patient is from: Home Needs to continue in-hospital care: Conservative management of bowel obstruction Anticipated d/c to: Hopefully home in 1 to 2 days    Diet:  Diet Order             Diet clear liquid Room service appropriate? Yes; Fluid consistency: Thin  Diet effective now                   Scheduled Meds:  enoxaparin (LOVENOX) injection  40 mg Subcutaneous Q24H   fluticasone furoate-vilanterol  1 puff Inhalation Daily   And   umeclidinium bromide  1 puff Inhalation Daily   loratadine  10 mg Oral Daily   pantoprazole (PROTONIX) IV  40 mg Intravenous Q24H   sodium chloride flush  3 mL Intravenous Q12H    PRN meds: acetaminophen **OR** acetaminophen, diphenhydrAMINE, HYDROmorphone (DILAUDID) injection, levalbuterol, LORazepam, ondansetron **OR** ondansetron (ZOFRAN) IV, phenol   Infusions:    Antimicrobials: Anti-infectives (From admission, onward)    None       Objective: Vitals:   08/12/23 0904 08/12/23 0910  BP: (!) 118/55   Pulse: 61 66  Resp: 17 17  Temp: 97.8 F (36.6 C)   SpO2: 95% 96%    Intake/Output Summary (Last 24 hours) at 08/12/2023 1450 Last data filed at 08/12/2023 0900 Gross per 24 hour  Intake 50 ml  Output 175 ml  Net -125 ml   Filed Weights   08/11/23 0356  Weight: 68 kg   Weight change:  Body mass index is 25.75 kg/m.   Physical Exam: General exam: Pleasant, elderly Caucasian female Skin: No rashes, lesions or ulcers. HEENT: Atraumatic, normocephalic, no obvious bleeding Lungs: Clear to auscultation bilaterally,  CVS: S1, S2, no murmur,   GI/Abd: Soft, nontender, nondistended, bowel sound present,   CNS: Alert, awake, oriented x 3 Psychiatry: Mood appropriate,  Extremities: No pedal edema, no calf tenderness,   Data Review: I have personally reviewed the laboratory data and studies available.  F/u labs ordered Unresulted  Labs (From admission, onward)     Start     Ordered   08/13/23 0500  Basic metabolic panel  Tomorrow morning,   R        08/12/23 1450   08/13/23 0500  CBC with Differential/Platelet  Tomorrow morning,   R        08/12/23 1450   08/13/23 0500  Magnesium  Tomorrow morning,   R        08/12/23 1450   08/13/23 0500  Phosphorus  Tomorrow morning,   R        08/12/23 1450   08/11/23 0416  Urinalysis, Routine w reflex microscopic -Urine, Clean Catch  Once,   URGENT       Question:  Specimen Source  Answer:  Urine, Clean Catch   08/11/23 0416           Total time spent in review of labs and imaging, patient evaluation, formulation of plan, documentation and communication with family: 45 minutes  Signed, Lorin Glass, MD [facility] 08/12/2023

## 2023-08-12 NOTE — Progress Notes (Signed)
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 She states she had her left chest pacemaker placed 3 months ago (chart states 8-24). She is currently on 3 L of O2 at home baseline. Sat was 95% on 3L [place].  Patient placed on cardiac monitoring, verified and oriented to the dept.

## 2023-08-13 ENCOUNTER — Inpatient Hospital Stay (HOSPITAL_COMMUNITY): Payer: Medicare Other

## 2023-08-13 DIAGNOSIS — K56609 Unspecified intestinal obstruction, unspecified as to partial versus complete obstruction: Secondary | ICD-10-CM | POA: Diagnosis not present

## 2023-08-13 LAB — CBC WITH DIFFERENTIAL/PLATELET
Abs Immature Granulocytes: 0.04 10*3/uL (ref 0.00–0.07)
Basophils Absolute: 0.1 10*3/uL (ref 0.0–0.1)
Basophils Relative: 1 %
Eosinophils Absolute: 0.1 10*3/uL (ref 0.0–0.5)
Eosinophils Relative: 1 %
HCT: 40 % (ref 36.0–46.0)
Hemoglobin: 13 g/dL (ref 12.0–15.0)
Immature Granulocytes: 1 %
Lymphocytes Relative: 10 %
Lymphs Abs: 0.8 10*3/uL (ref 0.7–4.0)
MCH: 30.1 pg (ref 26.0–34.0)
MCHC: 32.5 g/dL (ref 30.0–36.0)
MCV: 92.6 fL (ref 80.0–100.0)
Monocytes Absolute: 0.5 10*3/uL (ref 0.1–1.0)
Monocytes Relative: 5 %
Neutro Abs: 7.2 10*3/uL (ref 1.7–7.7)
Neutrophils Relative %: 82 %
Platelets: 251 10*3/uL (ref 150–400)
RBC: 4.32 MIL/uL (ref 3.87–5.11)
RDW: 13.1 % (ref 11.5–15.5)
WBC: 8.6 10*3/uL (ref 4.0–10.5)
nRBC: 0 % (ref 0.0–0.2)

## 2023-08-13 LAB — BASIC METABOLIC PANEL
Anion gap: 12 (ref 5–15)
BUN: 19 mg/dL (ref 8–23)
CO2: 25 mmol/L (ref 22–32)
Calcium: 9.6 mg/dL (ref 8.9–10.3)
Chloride: 104 mmol/L (ref 98–111)
Creatinine, Ser: 1.02 mg/dL — ABNORMAL HIGH (ref 0.44–1.00)
GFR, Estimated: 55 mL/min — ABNORMAL LOW (ref >60)
Glucose, Bld: 110 mg/dL — ABNORMAL HIGH (ref 70–99)
Potassium: 4.1 mmol/L (ref 3.5–5.1)
Sodium: 141 mmol/L (ref 135–145)

## 2023-08-13 LAB — MAGNESIUM: Magnesium: 2.3 mg/dL (ref 1.7–2.4)

## 2023-08-13 LAB — PHOSPHORUS: Phosphorus: 3.1 mg/dL (ref 2.5–4.6)

## 2023-08-13 MED ORDER — NAPHAZOLINE-GLYCERIN 0.012-0.25 % OP SOLN: 1.0000 [drp] | Freq: Four times a day (QID) | OPHTHALMIC | Status: DC | PRN

## 2023-08-13 MED ORDER — PREGABALIN 25 MG PO CAPS
50.0000 mg | ORAL_CAPSULE | Freq: Two times a day (BID) | ORAL | Status: DC
  Administered 2023-08-13 – 2023-08-15 (×5): 50 mg via ORAL
  Filled 2023-08-13 (×5): qty 2

## 2023-08-13 MED ORDER — ATENOLOL 25 MG PO TABS
25.0000 mg | ORAL_TABLET | Freq: Every day | ORAL | Status: DC
Start: 2023-08-13 — End: 2023-08-15
  Administered 2023-08-13 – 2023-08-15 (×3): 25 mg via ORAL
  Filled 2023-08-13 (×3): qty 1

## 2023-08-13 MED ORDER — ALPRAZOLAM 0.5 MG PO TABS
1.0000 mg | ORAL_TABLET | Freq: Every day | ORAL | Status: DC
  Administered 2023-08-13 – 2023-08-14 (×2): 1 mg via ORAL
  Filled 2023-08-13 (×2): qty 2

## 2023-08-13 MED ORDER — MELATONIN 3 MG PO TABS
3.0000 mg | ORAL_TABLET | Freq: Every day | ORAL | Status: DC
  Administered 2023-08-13 – 2023-08-14 (×2): 3 mg via ORAL
  Filled 2023-08-13 (×2): qty 1

## 2023-08-13 MED ORDER — POLYVINYL ALCOHOL 1.4 % OP SOLN
1.0000 [drp] | Freq: Four times a day (QID) | OPHTHALMIC | Status: DC | PRN
Start: 2023-08-13 — End: 2023-08-15
  Administered 2023-08-14: 1 [drp] via OPHTHALMIC
  Filled 2023-08-13: qty 15

## 2023-08-13 NOTE — Progress Notes (Signed)
Mobility Specialist Progress Note:   08/13/23 1633  Mobility  Activity Ambulated with assistance in hallway  Level of Assistance Contact guard assist, steadying assist  Assistive Device None  Distance Ambulated (ft) 150 ft  Activity Response Tolerated well  Mobility Referral Yes  Mobility visit 1 Mobility  Mobility Specialist Start Time (ACUTE ONLY) 1620  Mobility Specialist Stop Time (ACUTE ONLY) 1633  Mobility Specialist Time Calculation (min) (ACUTE ONLY) 13 min   Pt received standing in room, agreeable to mobility per RN request. Ambulated on 3 L. VSS. 1x LOB during ambulation but pt quickly self corrected with MinG assist. Pt c/o slight SOB, otherwise asx throughout. Pt left in bed with call bell in reach and al needs met.   Brittany Irwin  Mobility Specialist Please contact via [employer] office at [phone]

## 2023-08-13 NOTE — TOC Initial Note (Signed)
Transition of Care (TOC) - Initial/Assessment Note   Patient from home, [facility] . Sister close by.   Patient has home oxygen [employer] . Patient has portable oxygen DME. Family can provide transportation home at discharge and bring oxygen.   Patient wears oxygen 24/7 at home.   Patient has had [facility] home [facility] in the past. If home health needed at discharge she wants [facility]  Patient Details  Name: Brittany Irwin MRN: [id number] Date of Birth: 1940-10-30  Transition of [facility]) CM/SW Contact:    Kingsley Plan, RN Phone Number: 08/13/2023, 1:44 PM  Clinical Narrative:                   Expected Discharge Plan: Home/Self Care Barriers to Discharge: Continued Medical Work up   Patient Goals and CMS Choice Patient states their goals for this hospitalization and ongoing recovery are:: to return to home          Expected Discharge Plan and Services   Discharge Planning Services: CM Consult   Living arrangements for the past 2 months: Apartment                 DME Arranged: N/A DME Agency: NA       HH Arranged: NA HH Agency: NA        Prior Living Arrangements/Services Living arrangements for the past 2 months: Apartment Lives with:: Self Patient language and need for interpreter reviewed:: Yes Do you feel safe going back to the place where you live?: Yes      Need for Family Participation in Patient Care:  (continue to follow)   Current home services: DME Criminal Activity/Legal Involvement Pertinent to Current Situation/Hospitalization: No - Comment as needed  Activities of Daily Living   ADL Screening (condition at time of admission) Independently performs ADLs?: Yes (appropriate for developmental age) Is the patient deaf or have difficulty hearing?: No Does the patient have difficulty seeing, even when wearing glasses/contacts?: No Does the patient have difficulty concentrating, remembering, or making decisions?: No  Permission  Sought/Granted   Permission granted to share information with : Yes, Verbal Permission Granted  Share Information with NAME: sister Bonita Quin           Emotional Assessment   Attitude/Demeanor/Rapport: Engaged Affect (typically observed): Accepting Orientation: : Oriented to Self, Oriented to Place, Oriented to  Time, Oriented to Situation Alcohol / Substance Use: Not Applicable Psych Involvement: No (comment)  Admission diagnosis:  Small bowel obstruction (HCC) [K56.609] SBO (small bowel obstruction) (HCC) [K56.609] Patient Active Problem List   Diagnosis Date Noted   SBO (small bowel obstruction) (HCC) 08/11/2023   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   Presence of permanent cardiac pacemaker 08/11/2023   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 [facility] [address] [place] [address] Phone: [phone] Fax: [phone]     Social Drivers of Health (SDOH) Social History: SDOH Screenings  Food Insecurity: No Food Insecurity (08/12/2023)  Housing: Low Risk  (08/12/2023)  Transportation Needs: No Transportation Needs (08/12/2023)  Utilities: Not At Risk (08/12/2023)  Financial Resource Strain: Medium Risk (07/27/2023)   Received from [facility]  Physical Activity: Insufficiently Active (07/27/2023)   Received from [facility]  Social Connections: Unknown (08/12/2023)   Stress: No Stress Concern Present (07/27/2023)   Received from [facility]  Tobacco Use: Medium Risk (08/11/2023)   SDOH Interventions:     Readmission Risk Interventions     No data to display

## 2023-08-13 NOTE — Progress Notes (Signed)
PROGRESS NOTE  Brittany Irwin  DOB: 1940-11-17  PCP: Teodoro Spray, MD [id number]  DOA: 08/11/2023  LOS: 2 days  Hospital Day: 3  Brief narrative: WINDY DUDEK is a 83 y.o. female with PMH significant for HTN, CAD, CHF, tachybradycardia syndrome s/p PPM, CKD, COPD, chronic respiratory failure with hypoxia on 3 L nasal cannula at baseline, chronic back pain, anxiety, Crohn's disease status post multiple surgeries leading to recurrent SBO. Most recent surgery 10 years ago.  Most recent SBO 2022. At baseline, patient is independent on ADLs and occasionally uses walker. 1/22, patient presented to the ED with complaint of abdominal pain, bloating along with cessation of flatus and BMs for 2 to 3 days.   In the ED, vital signs stable Routine labs stable CT imaging showed small bowel loops in the pelvis are fluid-filled and dilated up to 4.2 cm diameter, subtending mesentery of these dilated small bowel loops is congested and edematous, and dilated loop of small bowel in the anterior right paramidline pelvis demonstrating fecalization of enteric contents with an apparent abrupt transition zone.   Patient had been given Zofran and Dilaudid for pain. Admitted to [facility] General Surgery was consulted Started on conservative management  Subjective: Patient was seen and examined this afternoon. Lying down in bed.  Not in distress.  Events from last night noted.  Patient was confused, agitated, pulled out NG tube and required IV Ativan.  Assessment and plan: Small bowel obstruction H/o Crohn's disease s/p multiple surgeries --> recommended p.o. Presented with abdominal pain, nausea, vomiting  CT scan of the abdomen pelvis raised concern for small bowel obstruction with a transition point in the anterior right paramidline pelvis.   Currently on conservative management per general surgery recommendation Follow-up abdominal series x-ray yesterday showed contrast in colon and improved  SBO Despite return of bowel function, patient continues to complain abdominal pain.  Abdominal series repeated today.  Continue to monitor As needed IV antiemetics  Acute metabolic encephalopathy History of anxiety Likely hospital induced delirium in the setting of acute pain use of pain medicines, mood altering medications Required IV Ativan as needed. For tonight, I have added melatonin 3 mg nightly and also resumed Xanax 1 mg nightly that she was taking at home.   Chronic respiratory failure with hypoxia COPD Uses 3 L O2 at baseline  COPD stable currently  Continue bronchodilators   Chronic diastolic CHF HTN Most recent echo from March 2024 with EF 60 to 65%, G1 DD PTA meds- Bumex 1 mg daily, losartan 25 mg daily. Currently on hold  Tachybradycardia syndrome  s/p PPM 02/2023 Resumed atenolol 25 mg daily.  CAD, HLD Has history of CAD.  Patient denies any stent or bypass in the past.  Not on any blood thinner. PTA meds- Zetia 10 mg daily, fenofibrate 200 mg daily. Resume when oral intake is insured.  CKD 3a Creatinine stable Recent Labs    10/14/22 1100 10/14/22 1122 10/15/22 0028 10/17/22 0751 10/21/22 1633 08/11/23 0426 08/12/23 0631 08/13/23 0655  BUN 36* 34* 33* 26* 35* 24* 23 19  CREATININE 1.27* 1.30* 1.32* 1.07* 0.98 1.12* 1.11* 1.02*     Crohn's disease Followed by Dr. Aline August for Crohn's disease which had been treated in the past with Humira then MTX complicated by elevated LFTs Continue outpatient follow-up with gastroenterology   Chronic back pain Flexeril as needed  GERD PPI  Acute sinusitis Also complained of last few days of sinus congestion, low-grade fever and greenish mucus Patient reports  he had improvement the past with Zyrtec.  Started on Claritin. If develops fever or leukocytosis, may need antibiotic treatment as well.    Mobility: Encourage ambulation  Goals of care   Code Status: Full Code     DVT prophylaxis:  enoxaparin  (LOVENOX) injection 40 mg Start: 08/11/23 1800   Antimicrobials: None Fluid: None currently Consultants: General Surgery Family Communication: None at bedside  Status: Inpatient Level of care:  Telemetry Medical   Patient is from: Home Needs to continue in-hospital care: Conservative management of bowel obstruction Anticipated d/c to: Hopefully home in 1 to 2 days    Diet:  Diet Order             Diet clear liquid Room service appropriate? Yes; Fluid consistency: Thin  Diet effective now                   Scheduled Meds:  atenolol  25 mg Oral Daily   enoxaparin (LOVENOX) injection  40 mg Subcutaneous Q24H   fluticasone furoate-vilanterol  1 puff Inhalation Daily   And   umeclidinium bromide  1 puff Inhalation Daily   loratadine  10 mg Oral Daily   pantoprazole (PROTONIX) IV  40 mg Intravenous Q24H   pregabalin  50 mg Oral BID   sodium chloride flush  3 mL Intravenous Q12H    PRN meds: acetaminophen **OR** acetaminophen, diphenhydrAMINE, HYDROmorphone (DILAUDID) injection, levalbuterol, LORazepam, naphazoline-glycerin, ondansetron **OR** ondansetron (ZOFRAN) IV, phenol   Infusions:    Antimicrobials: Anti-infectives (From admission, onward)    None       Objective: Vitals:   08/13/23 0518 08/13/23 0748  BP: (!) 152/68 (!) 149/76  Pulse: 84 82  Resp:  16  Temp: 98.9 F (37.2 C) 97.9 F (36.6 C)  SpO2: 96% 97%   No intake or output data in the 24 hours ending 08/13/23 1237  Filed Weights   08/11/23 0356  Weight: 68 kg   Weight change:  Body mass index is 25.75 kg/m.   Physical Exam: General exam: Pleasant, elderly Caucasian female Skin: No rashes, lesions or ulcers. HEENT: Atraumatic, normocephalic, no obvious bleeding Lungs: Clear to auscultation bilaterally,  CVS: S1, S2, no murmur,   GI/Abd: Soft, mild lower abdominal tenderness present, nondistended, bowel sound present,   CNS: Alert, awake, oriented x 3.  Slow to respond but not  disoriented at this time Psychiatry: Mood appropriate,  Extremities: No pedal edema, no calf tenderness,   Data Review: I have personally reviewed the laboratory data and studies available.  F/u labs ordered Unresulted Labs (From admission, onward)     Start     Ordered   08/11/23 0416  Urinalysis, Routine w reflex microscopic -Urine, Clean Catch  Once,   URGENT       Question:  Specimen Source  Answer:  Urine, Clean Catch   08/11/23 0416           Total time spent in review of labs and imaging, patient evaluation, formulation of plan, documentation and communication with family: 45 minutes  Signed, Lorin Glass, MD [facility] 08/13/2023

## 2023-08-13 NOTE — Progress Notes (Signed)
Patient had c/o abdominal discomfort at 2200 and requested NGT be reconnected to LIS. After NGT placed to LIS, patient stated relief but also requested ativan at this time, Ativan .5mg  given at 2214.  Patient rested quietly with eyes closed until 0145 when she called her sister, Brittany Irwin and had an incoherent conversation. She was able to go back to sleep until approx 0300, when she awoke in a confused state, didn't recognize her surroundings, and walked to the doorway of her room wanting to know where she was. NGT noted to be not in place. Stayed with patient, we called her sister again on her phone and talked to her until she was more clear.  Dr Ave Filter) notified NGT was pulled out. Patient now resting quietly in bed, A/o x 4.

## 2023-08-13 NOTE — Progress Notes (Signed)
Progress Note     Subjective: Had abdominal pain overnight and NGT initially reconnected to [facility] with relief. Then some confusion and NGT pulled out. She has good recollection of these events. She states abdominal pain is overall improved than initially and from yesterday but still present. No more BM overnight or today but still passing some flatus. No burping. No nausea. With her obstructions in the past she states she has had significant diarrhea once they have resolved. She is on tylenol with codeine at baseline for pain but also to help with diarrhea due to side effects of constipation  Objective: Vital signs in last 24 hours: Temp:  [97.8 F (36.6 C)-98.9 F (37.2 C)] 97.9 F (36.6 C) (01/24 0748) Pulse Rate:  [61-84] 82 (01/24 0748) Resp:  [16-17] 16 (01/24 0748) BP: (118-152)/(55-78) 149/76 (01/24 0748) SpO2:  [92 %-97 %] 97 % (01/24 0748) Last BM Date : 08/13/23  Intake/Output from previous day: 01/23 0701 - 01/24 0700 In: 50 [P.O.:50] Out: -  Intake/Output this shift: No intake/output data recorded.  PE: General: pleasant, WD, female who is laying in bed in NAD Lungs: Respiratory effort nonlabored on room air Abd: soft, mild distension - appears slightly worse from yesterday. Mild TTP epigastrium. Well healed surgical scars. NGT out MSK: all 4 extremities are symmetrical with no cyanosis, clubbing, or edema. Skin: warm and dry Psych: A&Ox3 with an appropriate affect.    Lab Results:  Recent Labs    08/12/23 0631 08/13/23 0655  WBC 6.6 8.6  HGB 12.3 13.0  HCT 38.6 40.0  PLT 205 251   BMET Recent Labs    08/12/23 0631 08/13/23 0655  NA 140 141  K 4.3 4.1  CL 106 104  CO2 26 25  GLUCOSE 104* 110*  BUN 23 19  CREATININE 1.11* 1.02*  CALCIUM 9.1 9.6   PT/INR No results for input(s): "LABPROT", "INR" in the last 72 hours. CMP     Component Value Date/Time   NA 141 08/13/2023 0655   NA 140 10/21/2022 1633   K 4.1 08/13/2023 0655   CL 104  08/13/2023 0655   CO2 25 08/13/2023 0655   GLUCOSE 110 (H) 08/13/2023 0655   BUN 19 08/13/2023 0655   BUN 35 (H) 10/21/2022 1633   CREATININE 1.02 (H) 08/13/2023 0655   CALCIUM 9.6 08/13/2023 0655   PROT 7.0 08/12/2023 0631   ALBUMIN 3.5 08/12/2023 0631   AST 20 08/12/2023 0631   ALT 8 08/12/2023 0631   ALKPHOS 35 (L) 08/12/2023 0631   BILITOT 1.2 08/12/2023 0631   GFRNONAA 55 (L) 08/13/2023 0655   Lipase     Component Value Date/Time   LIPASE 41 08/11/2023 0426       Studies/Results: DG Abd Portable 1V Result Date: 08/12/2023 CLINICAL DATA:  Small bowel obstruction. Small bowel delay film, 8 hours. Image taken at 10:15 a.m. 8.25 hour delay images. EXAM: PORTABLE ABDOMEN - 1 VIEW COMPARISON:  KUB 08/11/2023 at 6:16 p.m., upper abdominal radiograph 9:45 a.m.; CT abdomen and pelvis 08/11/2023 FINDINGS: Previously oral contrast was seen within the predominantly transverse colon, descending colon, and sigmoid colon. Note is made the patient is status post resection of the ascending colon. Oral contrast has now mildly advanced predominantly into the descending colon and sigmoid colon. Surgical suture overlies the right lower quadrant. Nasogastric tube appears mildly retracted from the two prior radiographs, with the side port now overlying the gastroesophageal junction. Note is made that the dilated small bowel loops previously  were predominantly within the level of the pelvis, and the caliber of the loops of bowel within the pelvis again do appear improved from 08/11/2023 CT demonstrating small-bowel obstruction. Peripherally calcified structures overlying the right flank are seen on CT to represent subcutaneous fat calcifications, likely from injection granulomas. The lung bases are clear.  AICD leads again noted. IMPRESSION: Compared to 08/11/2023 at 6:16 p.m. (16 hours earlier): 1. Oral contrast has now mildly advanced predominantly into the descending colon and sigmoid colon. 2. Nasogastric  tube appears mildly retracted from the two prior radiographs, with the side port now overlying the gastroesophageal junction. 3. Note is made that the dilated small bowel loops previously were predominantly within the level of the pelvis, and the caliber of the loops of bowel within the pelvis again do appear improved from 08/11/2023 CT (as on most recent 08/11/2023 radiographs). Electronically Signed   By: Neita Garnet M.D.   On: 08/12/2023 10:51   DG Abd Portable 1V-Small Bowel Obstruction Protocol-initial, 8 hr delay Result Date: 08/11/2023 CLINICAL DATA:  Small bowel obstruction.  8 hour delay. EXAM: PORTABLE ABDOMEN - 1 VIEW COMPARISON:  CT and plain films earlier today. FINDINGS: Oral contrast material is seen within the colon. No visible dilated small bowel currently to suggest small bowel obstruction. NG tube is in the stomach. Prior cholecystectomy. No organomegaly or free air. IMPRESSION: NG tube in the stomach. Contrast has passed into the colon. No dilated small bowel currently to suggest small bowel obstruction. Electronically Signed   By: Charlett Nose M.D.   On: 08/11/2023 18:38   DG Abd Portable 1V-Small Bowel Protocol-Position Verification Result Date: 08/11/2023 CLINICAL DATA:  83 year old female NG tube placement. EXAM: PORTABLE ABDOMEN - 1 VIEW COMPARISON:  CT Abdomen and Pelvis 0535 hours today. FINDINGS: Portable AP semi upright view at 0948 hours. Enteric tube placed into the epigastrium, side hole the level of the gastric body. Stable cholecystectomy clips. Stable visible bowel gas pattern. Cardiac pacemaker redemonstrated. Lung bases appear well aerated. IMPRESSION: Satisfactory enteric tube placement into the stomach. Electronically Signed   By: Odessa Fleming M.D.   On: 08/11/2023 10:04    Anti-infectives: Anti-infectives (From admission, onward)    None        Assessment/Plan  SBO, likely related to intra-abdominal adhesions    - follow up xrays with contrast in colon and  improved small bowel dilatation. Repeat xray this am given ongoing pain and distension - having bowel movements - NGT out overnight. No n/v this am so can remain out for now and continue clears but discussed with patient may need replacement for worsening symptoms  FEN - CLD VTE - SCD's. 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    LOS: 2 days   Eric Form, [facility] Surgery 08/13/2023, 9:01 AM Please see Amion for pager number during day hours 7:00am-4:30pm

## 2023-08-14 DIAGNOSIS — K56609 Unspecified intestinal obstruction, unspecified as to partial versus complete obstruction: Secondary | ICD-10-CM | POA: Diagnosis not present

## 2023-08-14 NOTE — Progress Notes (Signed)
Progress Note     Subjective: Had a better night last night. Pain improved. Passing more flatus. No n/v and feeling less distended. No BM for a few days. She sees GI at [facility] and has not been on medication for crohns in several years  Objective: Vital signs in last 24 hours: Temp:  [97.5 F (36.4 C)-98.1 F (36.7 C)] 97.5 F (36.4 C) (01/25 0740) Pulse Rate:  [61-64] 64 (01/25 0740) Resp:  [16-20] 16 (01/25 0740) BP: (126-150)/(58-79) 150/66 (01/25 0740) SpO2:  [95 %-100 %] 100 % (01/25 0740) Last BM Date : 08/13/23  Intake/Output from previous day: No intake/output data recorded. Intake/Output this shift: No intake/output data recorded.  PE: General: pleasant, WD, female who is laying in bed in NAD Lungs: Respiratory effort nonlabored on room air Abd: soft, mild distension. Mild TTP epigastrium improved from yesterday. Well healed surgical scars. NGT out MSK: all 4 extremities are symmetrical with no cyanosis, clubbing, or edema. Skin: warm and dry Psych: A&Ox3 with an appropriate affect.    Lab Results:  Recent Labs    08/12/23 0631 08/13/23 0655  WBC 6.6 8.6  HGB 12.3 13.0  HCT 38.6 40.0  PLT 205 251   BMET Recent Labs    08/12/23 0631 08/13/23 0655  NA 140 141  K 4.3 4.1  CL 106 104  CO2 26 25  GLUCOSE 104* 110*  BUN 23 19  CREATININE 1.11* 1.02*  CALCIUM 9.1 9.6   PT/INR No results for input(s): "LABPROT", "INR" in the last 72 hours. CMP     Component Value Date/Time   NA 141 08/13/2023 0655   NA 140 10/21/2022 1633   K 4.1 08/13/2023 0655   CL 104 08/13/2023 0655   CO2 25 08/13/2023 0655   GLUCOSE 110 (H) 08/13/2023 0655   BUN 19 08/13/2023 0655   BUN 35 (H) 10/21/2022 1633   CREATININE 1.02 (H) 08/13/2023 0655   CALCIUM 9.6 08/13/2023 0655   PROT 7.0 08/12/2023 0631   ALBUMIN 3.5 08/12/2023 0631   AST 20 08/12/2023 0631   ALT 8 08/12/2023 0631   ALKPHOS 35 (L) 08/12/2023 0631   BILITOT 1.2 08/12/2023 0631   GFRNONAA 55 (L)  08/13/2023 0655   Lipase     Component Value Date/Time   LIPASE 41 08/11/2023 0426       Studies/Results: DG Abd Portable 1V Result Date: 08/13/2023 CLINICAL DATA:  Small-bowel obstruction EXAM: PORTABLE ABDOMEN - 1 VIEW COMPARISON:  08/12/2023 FINDINGS: Gastric tube is been removed. Surgical staples right upper quadrant. No stenotic staples right lower quadrant. Stomach incompletely distended. A few gas distended left mid abdominal loops. The colon is nondilated. Bilateral gluteal injection granulomas. Mild lumbar spondylitic change. IMPRESSION: Nonobstructive bowel gas pattern. Electronically Signed   By: Corlis Leak M.D.   On: 08/13/2023 15:25    Anti-infectives: Anti-infectives (From admission, onward)    None        Assessment/Plan  SBO, likely related to intra-abdominal adhesions    - follow up xrays with contrast in colon and improved small bowel dilatation - abdominal pain improved and tolerating CLD - advance to FLD  FEN - FLD VTE - SCD's. 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    LOS: 3 days   Eric Form, [facility] Surgery 08/14/2023, 12:36 PM Please see Amion for pager number during day hours 7:00am-4:30pm

## 2023-08-14 NOTE — Progress Notes (Signed)
PROGRESS NOTE  Brittany Irwin  DOB: 06-Oct-1940  PCP: Teodoro Spray, MD ZOX:[id number]  DOA: 08/11/2023  LOS: 3 days  Hospital Day: 4  Brief narrative: Brittany Irwin is a 83 y.o. female with PMH significant for HTN, CAD, CHF, tachybradycardia syndrome s/p PPM, CKD, COPD, chronic respiratory failure with hypoxia on 3 L nasal cannula at baseline, chronic back pain, anxiety, Crohn's disease status post multiple surgeries leading to recurrent SBO. Most recent surgery 10 years ago.  Most recent SBO 2022. At baseline, patient is independent on ADLs and occasionally uses walker. 1/22, patient presented to the ED with complaint of abdominal pain, bloating along with cessation of flatus and BMs for 2 to 3 days.   In the ED, vital signs stable Routine labs stable CT imaging showed small bowel loops in the pelvis are fluid-filled and dilated up to 4.2 cm diameter, subtending mesentery of these dilated small bowel loops is congested and edematous, and dilated loop of small bowel in the anterior right paramidline pelvis demonstrating fecalization of enteric contents with an apparent abrupt transition zone.   Patient had been given Zofran and Dilaudid for pain. Admitted to [facility] General Surgery was consulted Started on conservative management  Subjective: Patient was seen and examined this morning. Lying in bed.  Not in distress.  Slept well last night. Abdominal pain gradually improving. Diet advanced to full liquid by general surgery today.  Assessment and plan: Small bowel obstruction H/o Crohn's disease s/p multiple surgeries --> recommended p.o. Presented with abdominal pain, nausea, vomiting  CT scan of the abdomen pelvis raised concern for small bowel obstruction with a transition point in the anterior right paramidline pelvis.   Currently on conservative management per general surgery recommendation Follow-up abdominal series x-ray yesterday showed contrast in colon and improved  SBO Despite return of bowel function, patient continued to complain abdominal pain.  Abdominal series repeated yesterday.  Gradually improving abdominal pain today.  Diet advanced from clear liquid to full liquid today. As needed IV antiemetics  Acute metabolic encephalopathy History of anxiety Likely hospital induced delirium in the setting of acute pain use of pain medicines, mood altering medications Required IV Ativan as needed. Continue nightly melatonin 3 mg at Xanax 1 mg   Chronic respiratory failure with hypoxia COPD Uses 3 L O2 at baseline  COPD stable currently  Continue bronchodilators   Chronic diastolic CHF HTN Most recent echo from March 2024 with EF 60 to 65%, G1 DD PTA meds- Bumex 1 mg daily, losartan 25 mg daily. Currently on hold  Tachybradycardia syndrome  s/p PPM 02/2023 Resumed atenolol 25 mg daily.  CAD, HLD Has history of CAD.  Patient denies any stent or bypass in the past.  Not on any blood thinner. PTA meds- Zetia 10 mg daily, fenofibrate 200 mg daily. Resume when oral intake is insured.  CKD 3a Creatinine stable Recent Labs    10/14/22 1100 10/14/22 1122 10/15/22 0028 10/17/22 0751 10/21/22 1633 08/11/23 0426 08/12/23 0631 08/13/23 0655  BUN 36* 34* 33* 26* 35* 24* 23 19  CREATININE 1.27* 1.30* 1.32* 1.07* 0.98 1.12* 1.11* 1.02*     Crohn's disease Followed by Dr. Aline August at [facility] for Crohn's disease which had been treated in the past with Humira then MTX complicated by elevated LFTs  Continue outpatient follow-up with GI   Chronic back pain Flexeril as needed  GERD PPI  Acute sinusitis Also complained of last few days of sinus congestion, low-grade fever and greenish mucus Patient reports  he had improvement the past with Zyrtec.  Started on Claritin. If develops fever or leukocytosis, may need antibiotic treatment as well.    Mobility: Encourage ambulation  Goals of care   Code Status: Full Code     DVT prophylaxis:   enoxaparin (LOVENOX) injection 40 mg Start: 08/11/23 1800   Antimicrobials: None Fluid: None currently Consultants: General Surgery Family Communication: None at bedside  Status: Inpatient Level of care:  Telemetry Medical   Patient is from: Home Needs to continue in-hospital care: Conservative management of bowel obstruction Anticipated d/c to: Hopefully home in 1 to 2 days    Diet:  Diet Order             Diet full liquid Room service appropriate? Yes; Fluid consistency: Thin  Diet effective now                   Scheduled Meds:  ALPRAZolam  1 mg Oral QHS   atenolol  25 mg Oral Daily   enoxaparin (LOVENOX) injection  40 mg Subcutaneous Q24H   fluticasone furoate-vilanterol  1 puff Inhalation Daily   And   umeclidinium bromide  1 puff Inhalation Daily   loratadine  10 mg Oral Daily   melatonin  3 mg Oral QHS   pantoprazole (PROTONIX) IV  40 mg Intravenous Q24H   pregabalin  50 mg Oral BID   sodium chloride flush  3 mL Intravenous Q12H    PRN meds: acetaminophen **OR** acetaminophen, diphenhydrAMINE, HYDROmorphone (DILAUDID) injection, levalbuterol, LORazepam, ondansetron **OR** ondansetron (ZOFRAN) IV, phenol, polyvinyl alcohol   Infusions:    Antimicrobials: Anti-infectives (From admission, onward)    None       Objective: Vitals:   08/14/23 0352 08/14/23 0740  BP: (!) 126/58 (!) 150/66  Pulse: 61 64  Resp: 20 16  Temp: 97.6 F (36.4 C) (!) 97.5 F (36.4 C)  SpO2: 95% 100%   No intake or output data in the 24 hours ending 08/14/23 1503  Filed Weights   08/11/23 0356  Weight: 68 kg   Weight change:  Body mass index is 25.75 kg/m.   Physical Exam: General exam: Pleasant, elderly Caucasian female.  Not in distress Skin: No rashes, lesions or ulcers. HEENT: Atraumatic, normocephalic, no obvious bleeding Lungs: Clear to auscultation bilaterally,  CVS: S1, S2, no murmur,   GI/Abd: Soft, improving abdominal tenderness, nondistended,  bowel sound present,   CNS: Alert, awake, oriented x 3.   Psychiatry: Mood appropriate,  Extremities: No pedal edema, no calf tenderness,   Data Review: I have personally reviewed the laboratory data and studies available.  F/u labs ordered Unresulted Labs (From admission, onward)     Start     Ordered   08/11/23 0416  Urinalysis, Routine w reflex microscopic -Urine, Clean Catch  Once,   URGENT       Question:  Specimen Source  Answer:  Urine, Clean Catch   08/11/23 0416           Total time spent in review of labs and imaging, patient evaluation, formulation of plan, documentation and communication with family: 45 minutes  Signed, Lorin Glass, MD [facility] 08/14/2023

## 2023-08-14 NOTE — Progress Notes (Signed)
Mobility Specialist Progress Note:    08/14/23 1400  Oxygen Therapy  O2 Device Nasal Cannula  O2 Flow Rate (L/min) 3 L/min  Mobility  Activity Ambulated with assistance in hallway  Level of Assistance Standby assist, set-up cues, supervision of patient - no hands on  Assistive Device Front wheel walker  Distance Ambulated (ft) 250 ft  Activity Response Tolerated well  Mobility Referral Yes  Mobility visit 1 Mobility  Mobility Specialist Start Time (ACUTE ONLY) 1358  Mobility Specialist Stop Time (ACUTE ONLY) 1411  Mobility Specialist Time Calculation (min) (ACUTE ONLY) 13 min   Pt received in bed, eager for mobility. Asymptomatic throughout w/ no complaints. Pt left on EOB with call bell and all needs met.  [place]ante Earlene Plater Mobility Specialist Please contact via Special educational needs teacher or Rehab office at [phone]

## 2023-08-15 DIAGNOSIS — K56609 Unspecified intestinal obstruction, unspecified as to partial versus complete obstruction: Secondary | ICD-10-CM | POA: Diagnosis not present

## 2023-08-15 MED ORDER — BUMETANIDE 1 MG PO TABS: 1.0000 mg | ORAL_TABLET | Freq: Every day | ORAL | Status: DC | PRN

## 2023-08-15 MED ORDER — MELATONIN 3 MG PO TABS: 3.0000 mg | ORAL_TABLET | Freq: Every day | ORAL | Status: DC

## 2023-08-15 MED ORDER — DOCUSATE SODIUM 100 MG PO CAPS
100.0000 mg | ORAL_CAPSULE | Freq: Two times a day (BID) | ORAL | Status: DC
  Administered 2023-08-15: 100 mg via ORAL

## 2023-08-15 MED ORDER — OXYCODONE HCL 5 MG PO TABS
2.5000 mg | ORAL_TABLET | ORAL | Status: DC | PRN
  Administered 2023-08-15: 5 mg via ORAL
  Filled 2023-08-15: qty 1

## 2023-08-15 NOTE — Progress Notes (Signed)
Mobility Specialist Progress Note:   08/15/23 1151  Oxygen Therapy  O2 Device Nasal Cannula  O2 Flow Rate (L/min) 3 L/min  Mobility  Activity Ambulated with assistance in hallway  Level of Assistance Standby assist, set-up cues, supervision of patient - no hands on  Assistive Device Front wheel walker  Distance Ambulated (ft) 400 ft  Activity Response Tolerated well  Mobility Referral Yes  Mobility visit 1 Mobility  Mobility Specialist Start Time (ACUTE ONLY) 1112  Mobility Specialist Stop Time (ACUTE ONLY) 1128  Mobility Specialist Time Calculation (min) (ACUTE ONLY) 16 min   Pt received on EOB, eager for mobility. Asymptomatic w/ no complaints throughout. Took x1 standing rest break w/ increased distance. Pt left on EOB with call bell and all needs met.  D'Vante Earlene Plater Mobility Specialist Please contact via Special educational needs teacher or Rehab office at [phone]

## 2023-08-15 NOTE — Discharge Summary (Signed)
Physician Discharge Summary  KAZIA GRISANTI ZOX:[id number] DOB: 23-Feb-1941 DOA: 08/11/2023  PCP: Teodoro Spray, MD  Admit date: 08/11/2023 Discharge date: 08/15/2023  Admitted From: Home Discharge disposition: Home  Recommendations at discharge:  Stay on soft diet for next 2 to 3 days and gradually advance to regular diet Resume losartan.  Keep Bumex on hold.  Resume as needed for pedal edema and shortness of breath.   Brief narrative: Brittany Irwin is a 83 y.o. female with PMH significant for HTN, CAD, CHF, tachybradycardia syndrome s/p PPM, CKD, COPD, chronic respiratory failure with hypoxia on 3 L nasal cannula at baseline, chronic back pain, anxiety, Crohn's disease status post multiple surgeries leading to recurrent SBO. Most recent surgery 10 years ago.  Most recent SBO 2022. At baseline, patient is independent on ADLs and occasionally uses walker. 1/22, patient presented to the ED with complaint of abdominal pain, bloating along with cessation of flatus and BMs for 2 to 3 days.   In the ED, vital signs stable Routine labs stable CT imaging showed small bowel loops in the pelvis are fluid-filled and dilated up to 4.2 cm diameter, subtending mesentery of these dilated small bowel loops is congested and edematous, and dilated loop of small bowel in the anterior right paramidline pelvis demonstrating fecalization of enteric contents with an apparent abrupt transition zone.   Patient had been given Zofran and Dilaudid for pain. Admitted to [facility] General Surgery was consulted Started on conservative management  Subjective: Patient was seen and examined this morning. Sitting up at the edge of the bed.  Not in distress.  Abdomen pain improving. Ambulated on the hallway today.  Able to tolerate soft diet.  Hospital course: Small bowel obstruction H/o Crohn's disease s/p multiple surgeries  Presented with abdominal pain, nausea, vomiting  CT scan of the abdomen pelvis raised  concern for small bowel obstruction with a transition point in the anterior right paramidline pelvis.   She was started on conservative management per general surgery recommendation Follow-up abdominal series x-ray for 2 subsequent H showed gradual improvement.  Her abdominal pain improved as well.  Able to tolerate soft diet today and feels ready to go home.    Acute metabolic encephalopathy History of anxiety Likely hospital induced delirium in the setting of acute pain use of pain medicines, mood altering medications Required IV Ativan as needed. Continue nightly melatonin 3 mg at Xanax 1 mg. Patient wants to continue melatonin overnight as an outpatient.   Chronic respiratory failure with hypoxia COPD Uses 3 L O2 at baseline  COPD stable currently  Continue bronchodilators   Chronic diastolic CHF HTN Most recent echo from March 2024 with EF 60 to 65%, G1 DD PTA meds- Bumex 1 mg daily, losartan 25 mg daily. Okay to resume losartan post discharge.  I would keep Bumex on hold but can resume PRN for fluid retention, shortness of breath  Tachybradycardia syndrome  s/p PPM 02/2023 Continue atenolol 25 mg daily.  CAD, HLD Has history of CAD.  Patient denies any stent or bypass in the past.  Not on any blood thinner. PTA meds- Zetia 10 mg daily, fenofibrate 200 mg daily. Resume when oral intake is insured.  CKD 3a Creatinine stable Recent Labs    10/14/22 1100 10/14/22 1122 10/15/22 0028 10/17/22 0751 10/21/22 1633 08/11/23 0426 08/12/23 0631 08/13/23 0655  BUN 36* 34* 33* 26* 35* 24* 23 19  CREATININE 1.27* 1.30* 1.32* 1.07* 0.98 1.12* 1.11* 1.02*     Crohn's  disease Followed by Dr. Aline August at [facility] for Crohn's disease which had been treated in the past with Humira then MTX complicated by elevated LFTs  Continue outpatient follow-up with GI   Chronic back pain Flexeril as needed  GERD PPI  Acute sinusitis Also complained of last few days of sinus congestion,  low-grade fever and greenish mucus Patient reports he had improvement the past with Zyrtec.  Started on Claritin. If develops fever or leukocytosis, may need antibiotic treatment as well.    Mobility: Encourage ambulation  Goals of care   Code Status: Full Code   Diet:  Diet Order             DIET SOFT Room service appropriate? Yes; Fluid consistency: Thin  Diet effective now           Diet general                   Nutritional status:  Body mass index is 25.75 kg/m.       Wounds:  -    Discharge Exam:   Vitals:   08/14/23 1530 08/14/23 2011 08/15/23 0448 08/15/23 0926  BP: 133/61 131/62 (!) 137/59   Pulse: 65 62 65   Resp: 16 18 18    Temp: 97.6 F (36.4 C) 97.8 F (36.6 C) 99 F (37.2 C)   TempSrc: Oral Oral Oral   SpO2: 97% 96% 98% 94%  Weight:      Height:        Body mass index is 25.75 kg/m.  General exam: Pleasant, elderly Caucasian female.  Not in distress Skin: No rashes, lesions or ulcers. HEENT: Atraumatic, normocephalic, no obvious bleeding Lungs: Clear to auscultation bilaterally,  CVS: S1, S2, no murmur,   GI/Abd: Soft, improving abdominal tenderness, nondistended, bowel sound present,   CNS: Alert, awake, oriented x 3.   Psychiatry: Mood appropriate,  Extremities: No pedal edema, no calf tenderness,   Follow ups:    Follow-up Information     Teodoro Spray, MD Follow up.   Specialty: Internal Medicine Contact information: [address] [place] [address] [phone]                 Discharge Instructions:   Discharge Instructions     Call MD for:  difficulty breathing, headache or visual disturbances   Complete by: As directed    Call MD for:  extreme fatigue   Complete by: As directed    Call MD for:  hives   Complete by: As directed    Call MD for:  persistant dizziness or light-headedness   Complete by: As directed    Call MD for:  persistant nausea and vomiting   Complete by: As directed     Call MD for:  severe uncontrolled pain   Complete by: As directed    Call MD for:  temperature >100.4   Complete by: As directed    Diet general   Complete by: As directed    Discharge instructions   Complete by: As directed    Recommendations at discharge:   Stay on soft diet for next 2 to 3 days and gradually advance to regular diet  Resume losartan.  Keep Bumex on hold.  Resume as needed for pedal edema and shortness of breath.  General discharge instructions: Follow with Primary MD Teodoro Spray, MD in 7 days  Please request your PCP  to go over your hospital tests, procedures, radiology results at the follow up. Please get  your medicines reviewed and adjusted.  Your PCP may decide to repeat certain labs or tests as needed. Do not drive, operate heavy machinery, perform activities at heights, swimming or participation in water activities or provide baby sitting services if your were admitted for syncope or siezures until you have seen by Primary MD or a Neurologist and advised to do so again. [place] Controlled Substance Reporting System database was reviewed. Do not drive, operate heavy machinery, perform activities at heights, swim, participate in water activities or provide baby-sitting services while on medications for pain, sleep and mood until your outpatient physician has reevaluated you and advised to do so again.  You are strongly recommended to comply with the dose, frequency and duration of prescribed medications. Activity: As tolerated with Full fall precautions use walker/cane & assistance as needed Avoid using any recreational substances like cigarette, tobacco, alcohol, or non-prescribed drug. If you experience worsening of your admission symptoms, develop shortness of breath, life threatening emergency, suicidal or homicidal thoughts you must seek medical attention immediately by calling 911 or calling your MD immediately  if symptoms less severe. You must read  complete instructions/literature along with all the possible adverse reactions/side effects for all the medicines you take and that have been prescribed to you. Take any new medicine only after you have completely understood and accepted all the possible adverse reactions/side effects.  Wear Seat belts while driving. You were cared for by a hospitalist during your hospital stay. If you have any questions about your discharge medications or the care you received while you were in the hospital after you are discharged, you can call the unit and ask to speak with the hospitalist or the covering physician. Once you are discharged, your primary care physician will handle any further medical issues. Please note that NO REFILLS for any discharge medications will be authorized once you are discharged, as it is imperative that you return to your primary care physician (or establish a relationship with a primary care physician if you do not have one).   Increase activity slowly   Complete by: As directed        Discharge Medications:   Allergies as of 08/15/2023       Reactions   Methylprednisolone Other (See Comments)   Increased BP, HR, agitation, hallucinations    Azulfidine [sulfasalazine] Other (See Comments)   Headache    Epipen [epinephrine] Hypertension   Flagyl [metronidazole] Hives   Motrin [ibuprofen] Nausea Only, Other (See Comments)   Told not to take medication due to GI distress caused by crohn's disease.   Nsaids Other (See Comments)   Told not to take NSAIDs due to GI distress caused by crohn's disease   Penicillins Hives   Morphine And Codeine Itching   Told not to take due to GI distress caused by crohn's disease        Medication List     STOP taking these medications    furosemide 20 MG tablet Commonly known as: LASIX   metolazone 5 MG tablet Commonly known as: ZAROXOLYN       TAKE these medications    acetaminophen-codeine 300-60 MG tablet Commonly known as:  TYLENOL #4 Take 1 tablet by mouth 4 (four) times daily as needed for pain.   ALPRAZolam 1 MG tablet Commonly known as: XANAX Take 1 mg by mouth 3 (three) times daily as needed for anxiety.   atenolol 25 MG tablet Commonly known as: Tenormin Take half tablet (12.5 mg) by mouth  daily. DO NOT take if heart rate is less than 60 BPM What changed:  how much to take how to take this when to take this additional instructions   AYR SALINE NASAL GEL NA Place 1 Application into the nose 2 (two) times daily as needed (Dry nose).   bumetanide 1 MG tablet Commonly known as: BUMEX Take 1 tablet (1 mg total) by mouth daily as needed. What changed:  when to take this reasons to take this   cyanocobalamin 1000 MCG/ML injection Commonly known as: VITAMIN B12 Inject 1,000 mcg into the muscle every 30 (thirty) days.   cyclobenzaprine 5 MG tablet Commonly known as: FLEXERIL Take 5 mg by mouth 2 (two) times daily as needed for muscle spasms.   ergocalciferol 1.25 MG (50000 UT) capsule Commonly known as: VITAMIN D2 Take 50,000 Units by mouth every 14 (fourteen) days. Every other Friday   ezetimibe 10 MG tablet Commonly known as: ZETIA Take 10 mg by mouth at bedtime.   fenofibrate micronized 200 MG capsule Commonly known as: LOFIBRA Take 200 mg by mouth daily.   ICY HOT BACK EX Apply 1 Application topically 2 (two) times daily as needed (back pain).   levalbuterol 45 MCG/ACT inhaler Commonly known as: XOPENEX HFA Inhale 2 puffs into the lungs every 6 (six) hours as needed for shortness of breath or wheezing.   losartan 25 MG tablet Commonly known as: COZAAR Take 0.5 tablets (12.5 mg total) by mouth daily. What changed: how much to take   melatonin 3 MG Tabs tablet Take 1 tablet (3 mg total) by mouth at bedtime.   nitroGLYCERIN 0.4 MG SL tablet Commonly known as: NITROSTAT Place 0.4 mg under the tongue every 5 (five) minutes x 3 doses as needed for chest pain.   omeprazole 40  MG capsule Commonly known as: PRILOSEC Take 40 mg by mouth daily.   potassium chloride SA 20 MEQ tablet Commonly known as: KLOR-CON M Take 20 mEq by mouth daily.   pregabalin 50 MG capsule Commonly known as: LYRICA Take 50 mg by mouth 2 (two) times daily.   Trelegy Ellipta 200-62.5-25 MCG/ACT Aepb Generic drug: Fluticasone-Umeclidin-Vilant Inhale 1 puff into the lungs daily.   Voltaren Arthritis Pain 1 % Gel Generic drug: diclofenac Sodium Apply 1 Application topically 2 (two) times daily as needed (back pain).         The results of significant diagnostics from this hospitalization (including imaging, microbiology, ancillary and laboratory) are listed below for reference.    Procedures and Diagnostic Studies:   DG Abd Portable 1V Result Date: 08/12/2023 CLINICAL DATA:  Small bowel obstruction. Small bowel delay film, 8 hours. Image taken at 10:15 a.m. 8.25 hour delay images. EXAM: PORTABLE ABDOMEN - 1 VIEW COMPARISON:  KUB 08/11/2023 at 6:16 p.m., upper abdominal radiograph 9:45 a.m.; CT abdomen and pelvis 08/11/2023 FINDINGS: Previously oral contrast was seen within the predominantly transverse colon, descending colon, and sigmoid colon. Note is made the patient is status post resection of the ascending colon. Oral contrast has now mildly advanced predominantly into the descending colon and sigmoid colon. Surgical suture overlies the right lower quadrant. Nasogastric tube appears mildly retracted from the two prior radiographs, with the side port now overlying the gastroesophageal junction. Note is made that the dilated small bowel loops previously were predominantly within the level of the pelvis, and the caliber of the loops of bowel within the pelvis again do appear improved from 08/11/2023 CT demonstrating small-bowel obstruction. Peripherally calcified structures overlying the  right flank are seen on CT to represent subcutaneous fat calcifications, likely from injection  granulomas. The lung bases are clear.  AICD leads again noted. IMPRESSION: Compared to 08/11/2023 at 6:16 p.m. (16 hours earlier): 1. Oral contrast has now mildly advanced predominantly into the descending colon and sigmoid colon. 2. Nasogastric tube appears mildly retracted from the two prior radiographs, with the side port now overlying the gastroesophageal junction. 3. Note is made that the dilated small bowel loops previously were predominantly within the level of the pelvis, and the caliber of the loops of bowel within the pelvis again do appear improved from 08/11/2023 CT (as on most recent 08/11/2023 radiographs). Electronically Signed   By: Neita Garnet M.D.   On: 08/12/2023 10:51   DG Abd Portable 1V-Small Bowel Obstruction Protocol-initial, 8 hr delay Result Date: 08/11/2023 CLINICAL DATA:  Small bowel obstruction.  8 hour delay. EXAM: PORTABLE ABDOMEN - 1 VIEW COMPARISON:  CT and plain films earlier today. FINDINGS: Oral contrast material is seen within the colon. No visible dilated small bowel currently to suggest small bowel obstruction. NG tube is in the stomach. Prior cholecystectomy. No organomegaly or free air. IMPRESSION: NG tube in the stomach. Contrast has passed into the colon. No dilated small bowel currently to suggest small bowel obstruction. Electronically Signed   By: Charlett Nose M.D.   On: 08/11/2023 18:38   DG Abd Portable 1V-Small Bowel Protocol-Position Verification Result Date: 08/11/2023 CLINICAL DATA:  83 year old female NG tube placement. EXAM: PORTABLE ABDOMEN - 1 VIEW COMPARISON:  CT Abdomen and Pelvis 0535 hours today. FINDINGS: Portable AP semi upright view at 0948 hours. Enteric tube placed into the epigastrium, side hole the level of the gastric body. Stable cholecystectomy clips. Stable visible bowel gas pattern. Cardiac pacemaker redemonstrated. Lung bases appear well aerated. IMPRESSION: Satisfactory enteric tube placement into the stomach. Electronically Signed    By: Odessa Fleming M.D.   On: 08/11/2023 10:04   CT ABDOMEN PELVIS WO CONTRAST Result Date: 08/11/2023 CLINICAL DATA:  3 day history of diarrhea followed by constipation. Abdominal distension. History of Crohn disease in previous bowel obstruction. EXAM: CT ABDOMEN AND PELVIS WITHOUT CONTRAST TECHNIQUE: Multidetector CT imaging of the abdomen and pelvis was performed following the standard protocol without IV contrast. RADIATION DOSE REDUCTION: This exam was performed according to the departmental dose-optimization program which includes automated exposure control, adjustment of the mA and/or kV according to patient size and/or use of iterative reconstruction technique. COMPARISON:  01/02/2022 FINDINGS: Lower chest: Chronic atelectasis or scarring noted right lung base. Hepatobiliary: No suspicious focal abnormality in the liver on this study without intravenous contrast. Gallbladder is surgically absent. Stable prominence of the common bile duct, likely secondary to prior cholecystectomy. Pancreas: Pancreas is diffusely atrophic without main duct dilatation. Spleen: No splenomegaly. No suspicious focal mass lesion. Adrenals/Urinary Tract: No adrenal nodule or mass. Exophytic cyst noted upper pole left kidney. 2.1 cm exophytic lesion posterior interpolar left kidney measures water density, compatible with a cyst. No followup imaging is recommended. 11 mm low-density cortical interpolar left renal lesion on 32/3 is stable in the interval, measuring close to water attenuation today. This is statistically most likely benign and also likely a cyst. No followup imaging is recommended. Right kidney unremarkable. No evidence for hydroureter. The urinary bladder appears normal for the degree of distention. Stomach/Bowel: Tiny hiatal hernia. Stomach otherwise unremarkable. Duodenum is normally positioned as is the ligament of Treitz. Patient is status post right hemicolectomy. Small bowel loops in the  pelvis are fluid-filled and  dilated up to 4.2 cm diameter. Subtending mesentery of these dilated small bowel loops is congested and edematous. There is a dilated loop of small bowel in the anterior right paramidline pelvis demonstrating fecalization of enteric contents with an apparent abrupt transition zone best seen on coronal 108/6. Small bowel loops in the right pelvis and the neo terminal ileum are relatively decompressed. Gas and stool are seen scattered along the nondilated colon. Vascular/Lymphatic: There is moderate atherosclerotic calcification of the abdominal aorta without aneurysm. There is no gastrohepatic or hepatoduodenal ligament lymphadenopathy. No retroperitoneal or mesenteric lymphadenopathy. No pelvic sidewall lymphadenopathy. Reproductive: Hysterectomy.  There is no adnexal mass. Other: No intraperitoneal free fluid. Musculoskeletal: No worrisome lytic or sclerotic osseous abnormality. IMPRESSION: 1. Status post right hemicolectomy. Small bowel loops in the pelvis are fluid-filled and dilated up to 4.2 cm diameter. Subtending mesentery of these dilated small bowel loops is congested and edematous. There is a dilated loop of small bowel in the anterior right paramidline pelvis demonstrating fecalization of enteric contents with an apparent abrupt transition zone best seen on coronal 108/6. Imaging features are compatible with small bowel obstruction. 2. Tiny hiatal hernia. 3.  Aortic Atherosclerosis (ICD10-I70.0). Electronically Signed   By: Kennith Center M.D.   On: 08/11/2023 06:06   DG Chest Port 1 View Result Date: 08/11/2023 CLINICAL DATA:  83 year old female with cough and shortness of breath. EXAM: PORTABLE CHEST 1 VIEW COMPARISON:  Portable chest 10/14/2022 and earlier. FINDINGS: Portable AP semi upright view at 0426 hours. Rightward rotation similar to previous exam 01/27/2022. Stable cardiac and mediastinal contours since that time, with evidence of chronic cardiomegaly. Left chest dual lead cardiac pacemaker  is new from last year. Stable lung volumes. Visualized tracheal air column is within normal limits. No pneumothorax or pulmonary edema. No pleural effusion or consolidation. No acute osseous abnormality identified. Negative visible bowel gas. IMPRESSION: Rightward rotated view. New dual lead left chest cardiac pacemaker since last year. No acute cardiopulmonary abnormality identified. Electronically Signed   By: Odessa Fleming M.D.   On: 08/11/2023 04:54     Labs:   Basic Metabolic Panel: Recent Labs  Lab 08/11/23 0426 08/12/23 0631 08/13/23 0655  NA 139 140 141  K 4.7 4.3 4.1  CL 99 106 104  CO2 28 26 25   GLUCOSE 141* 104* 110*  BUN 24* 23 19  CREATININE 1.12* 1.11* 1.02*  CALCIUM 9.5 9.1 9.6  MG  --   --  2.3  PHOS  --   --  3.1   GFR Estimated Creatinine Clearance: 41 mL/min (A) (by C-G formula based on SCr of 1.02 mg/dL (H)). Liver Function Tests: Recent Labs  Lab 08/11/23 0426 08/12/23 0631  AST 30 20  ALT 9 8  ALKPHOS 34* 35*  BILITOT 0.9 1.2  PROT 7.1 7.0  ALBUMIN 3.7 3.5   Recent Labs  Lab 08/11/23 0426  LIPASE 41   No results for input(s): "AMMONIA" in the last 168 hours. Coagulation profile No results for input(s): "INR", "PROTIME" in the last 168 hours.  CBC: Recent Labs  Lab 08/11/23 0426 08/12/23 0631 08/13/23 0655  WBC 10.1 6.6 8.6  NEUTROABS 8.8*  --  7.2  HGB 12.6 12.3 13.0  HCT 39.7 38.6 40.0  MCV 95.2 94.8 92.6  PLT 190 205 251   Cardiac Enzymes: No results for input(s): "CKTOTAL", "CKMB", "CKMBINDEX", "TROPONINI" in the last 168 hours. BNP: Invalid input(s): "POCBNP" CBG: No results for input(s): "GLUCAP" in the  last 168 hours. D-Dimer No results for input(s): "DDIMER" in the last 72 hours. Hgb A1c No results for input(s): "HGBA1C" in the last 72 hours. Lipid Profile No results for input(s): "CHOL", "HDL", "LDLCALC", "TRIG", "CHOLHDL", "LDLDIRECT" in the last 72 hours. Thyroid function studies No results for input(s): "TSH",  "T4TOTAL", "T3FREE", "THYROIDAB" in the last 72 hours.  Invalid input(s): "FREET3" Anemia work up No results for input(s): "VITAMINB12", "FOLATE", "FERRITIN", "TIBC", "IRON", "RETICCTPCT" in the last 72 hours. Microbiology Recent Results (from the past 240 hours)  Resp panel by RT-PCR (RSV, Flu A&B, Covid) Anterior Nasal Swab     Status: None   Collection Time: 08/11/23  4:16 AM   Specimen: Anterior Nasal Swab  Result Value Ref Range Status   SARS Coronavirus 2 by RT PCR NEGATIVE NEGATIVE Final   Influenza A by PCR NEGATIVE NEGATIVE Final   Influenza B by PCR NEGATIVE NEGATIVE Final    Comment: (NOTE) The Xpert Xpress SARS-CoV-2/FLU/RSV plus assay is intended as an aid in the diagnosis of influenza from Nasopharyngeal swab specimens and should not be used as a sole basis for treatment. Nasal washings and aspirates are unacceptable for Xpert Xpress SARS-CoV-2/FLU/RSV testing.  Fact Sheet for Patients: BloggerCourse.com  Fact Sheet for Healthcare Providers: SeriousBroker.it  This test is not yet approved or cleared by the [place] FDA and has been authorized for detection and/or diagnosis of SARS-CoV-2 by FDA under an Emergency Use Authorization (EUA). This EUA will remain in effect (meaning this test can be used) for the duration of the COVID-19 declaration under Section 564(b)(1) of the Act, 21 U.S.C. section 360bbb-3(b)(1), unless the authorization is terminated or revoked.     Resp Syncytial Virus by PCR NEGATIVE NEGATIVE Final    Comment: (NOTE) Fact Sheet for Patients: BloggerCourse.com  Fact Sheet for Healthcare Providers: SeriousBroker.it  This test is not yet approved or cleared by the [place] FDA and has been authorized for detection and/or diagnosis of SARS-CoV-2 by FDA under an Emergency Use Authorization (EUA). This EUA will remain in effect (meaning  this test can be used) for the duration of the COVID-19 declaration under Section 564(b)(1) of the Act, 21 U.S.C. section 360bbb-3(b)(1), unless the authorization is terminated or revoked.  Performed at [facility], [address] [place], [place] [address]     Time coordinating discharge: 45 minutes  Signed: Braxten Memmer  [facility] 08/15/2023, 3:04 PM

## 2023-08-15 NOTE — Progress Notes (Signed)
Progress Note     Subjective: Continues to improve. Now having diarrhea which is normal for her as her SBO episodes resolve. Pain minimal. No n/v/worsening distension with FLD  Objective: Vital signs in last 24 hours: Temp:  [97.6 F (36.4 C)-99 F (37.2 C)] 99 F (37.2 C) (01/26 0448) Pulse Rate:  [62-65] 65 (01/26 0448) Resp:  [16-18] 18 (01/26 0448) BP: (131-137)/(59-62) 137/59 (01/26 0448) SpO2:  [96 %-98 %] 98 % (01/26 0448) Last BM Date : 08/13/23  Intake/Output from previous day: 01/25 0701 - 01/26 0700 In: 240 [P.O.:240] Out: -  Intake/Output this shift: No intake/output data recorded.  PE: General: pleasant, WD, female who is laying in bed in NAD Lungs: Respiratory effort nonlabored on room air Abd: soft, minimal distension. NT. Well healed surgical scars. NGT out MSK: all 4 extremities are symmetrical with no cyanosis, clubbing, or edema. Skin: warm and dry Psych: A&Ox3 with an appropriate affect.    Lab Results:  Recent Labs    08/13/23 0655  WBC 8.6  HGB 13.0  HCT 40.0  PLT 251   BMET Recent Labs    08/13/23 0655  NA 141  K 4.1  CL 104  CO2 25  GLUCOSE 110*  BUN 19  CREATININE 1.02*  CALCIUM 9.6   PT/INR No results for input(s): "LABPROT", "INR" in the last 72 hours. CMP     Component Value Date/Time   NA 141 08/13/2023 0655   NA 140 10/21/2022 1633   K 4.1 08/13/2023 0655   CL 104 08/13/2023 0655   CO2 25 08/13/2023 0655   GLUCOSE 110 (H) 08/13/2023 0655   BUN 19 08/13/2023 0655   BUN 35 (H) 10/21/2022 1633   CREATININE 1.02 (H) 08/13/2023 0655   CALCIUM 9.6 08/13/2023 0655   PROT 7.0 08/12/2023 0631   ALBUMIN 3.5 08/12/2023 0631   AST 20 08/12/2023 0631   ALT 8 08/12/2023 0631   ALKPHOS 35 (L) 08/12/2023 0631   BILITOT 1.2 08/12/2023 0631   GFRNONAA 55 (L) 08/13/2023 0655   Lipase     Component Value Date/Time   LIPASE 41 08/11/2023 0426       Studies/Results: DG Abd Portable 1V Result Date:  08/13/2023 CLINICAL DATA:  Small-bowel obstruction EXAM: PORTABLE ABDOMEN - 1 VIEW COMPARISON:  08/12/2023 FINDINGS: Gastric tube is been removed. Surgical staples right upper quadrant. No stenotic staples right lower quadrant. Stomach incompletely distended. A few gas distended left mid abdominal loops. The colon is nondilated. Bilateral gluteal injection granulomas. Mild lumbar spondylitic change. IMPRESSION: Nonobstructive bowel gas pattern. Electronically Signed   By: Corlis Leak M.D.   On: 08/13/2023 15:25    Anti-infectives: Anti-infectives (From admission, onward)    None        Assessment/Plan  SBO, likely related to intra-abdominal adhesions    - follow up xrays with contrast in colon and improved small bowel dilatation - pain improved and having bowel function. advance to soft. Stable for dc as early as this pm if tolerates - recommend she touch base with her [facility] specialist which I discussed with her  FEN - soft VTE - SCD's. 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    LOS: 4 days   Eric Form, [facility] 08/15/2023, 8:01 AM Please see Amion for pager number during day hours 7:00am-4:30pm

## 2023-08-27 IMAGING — CR DG CHEST 2V
2 series · 2 of 2 positions shown · non-contrast
Comparison: None available

CLINICAL DATA: [place]. Per EMS pt states that she was "not feeling
well" and took 1 Nitro Sublingual, 1 Xanax and 1 Atenolol. Pt denied
having CP. Pt also c/o Increased SHOB w/ exertion, weakness x2 days.

EXAM:
CHEST - 2 VIEW.  Patient is rotated on the frontal view.

[chest lat]
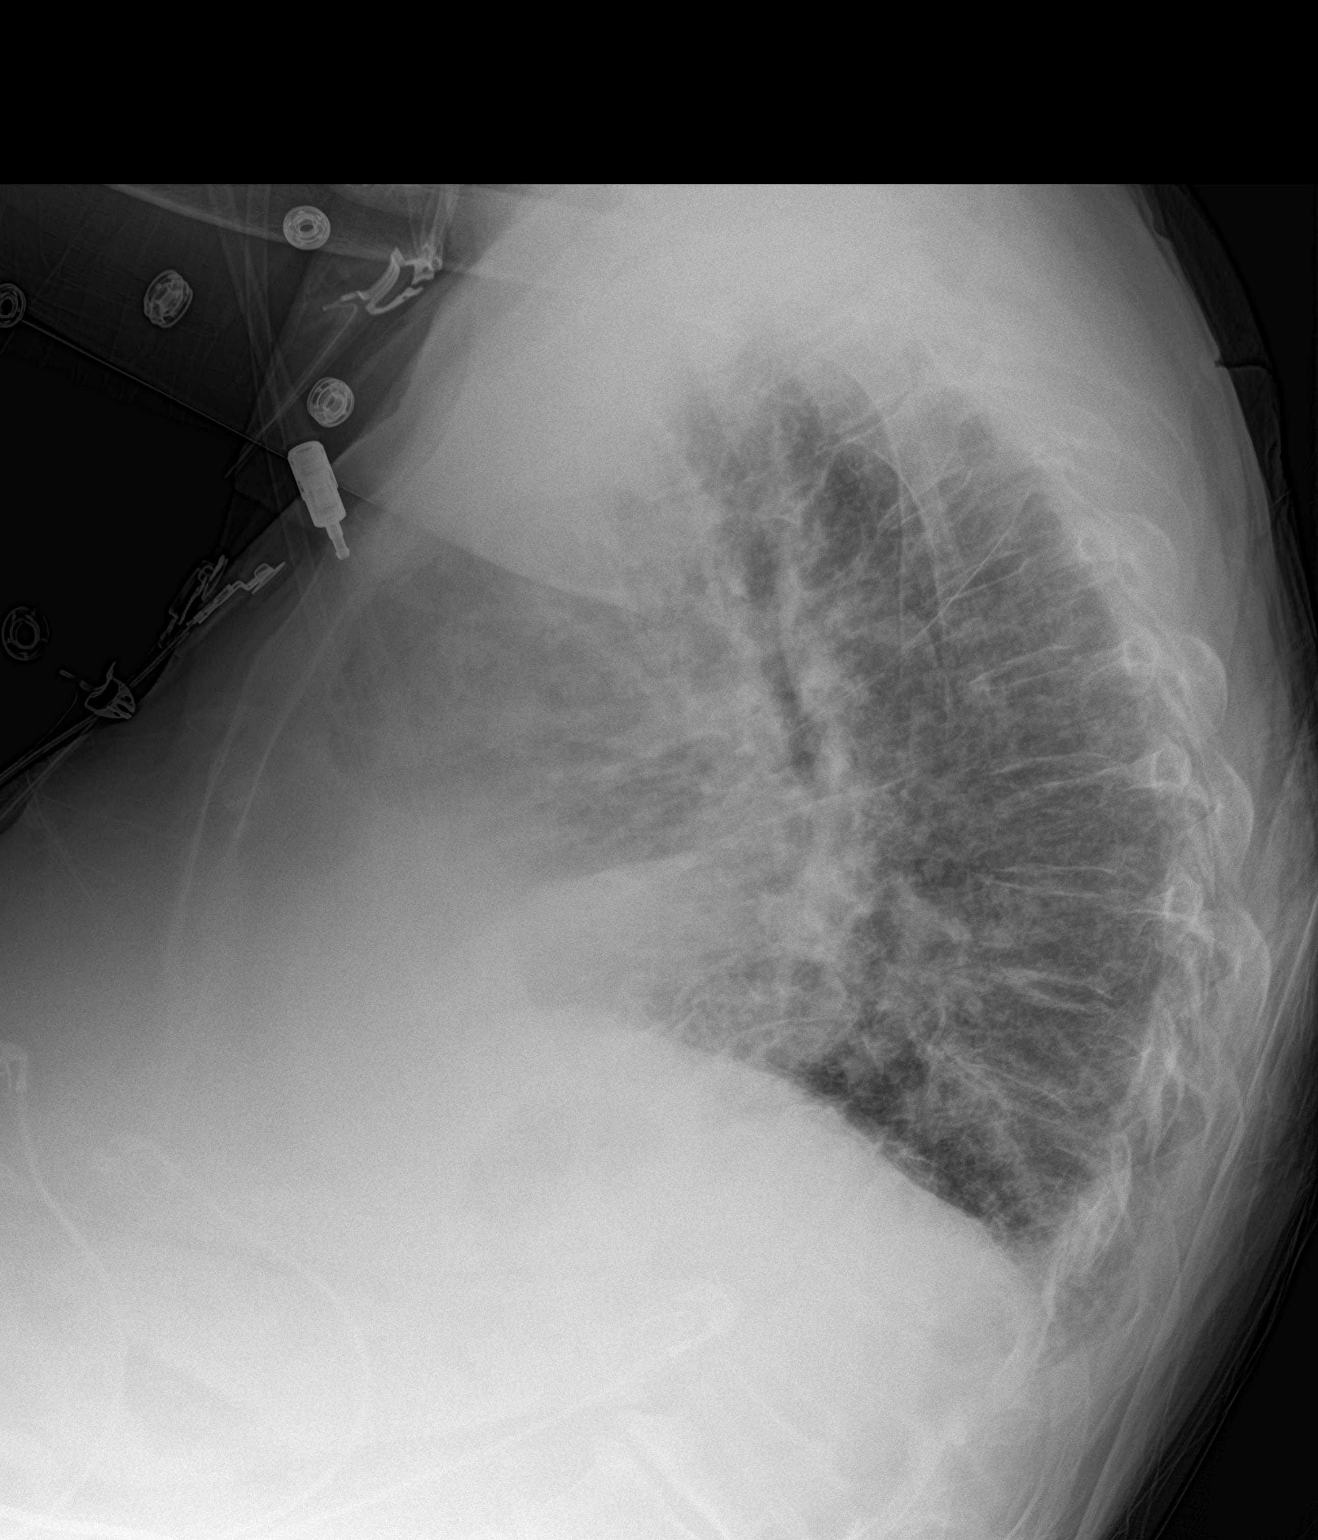

[chest ap]
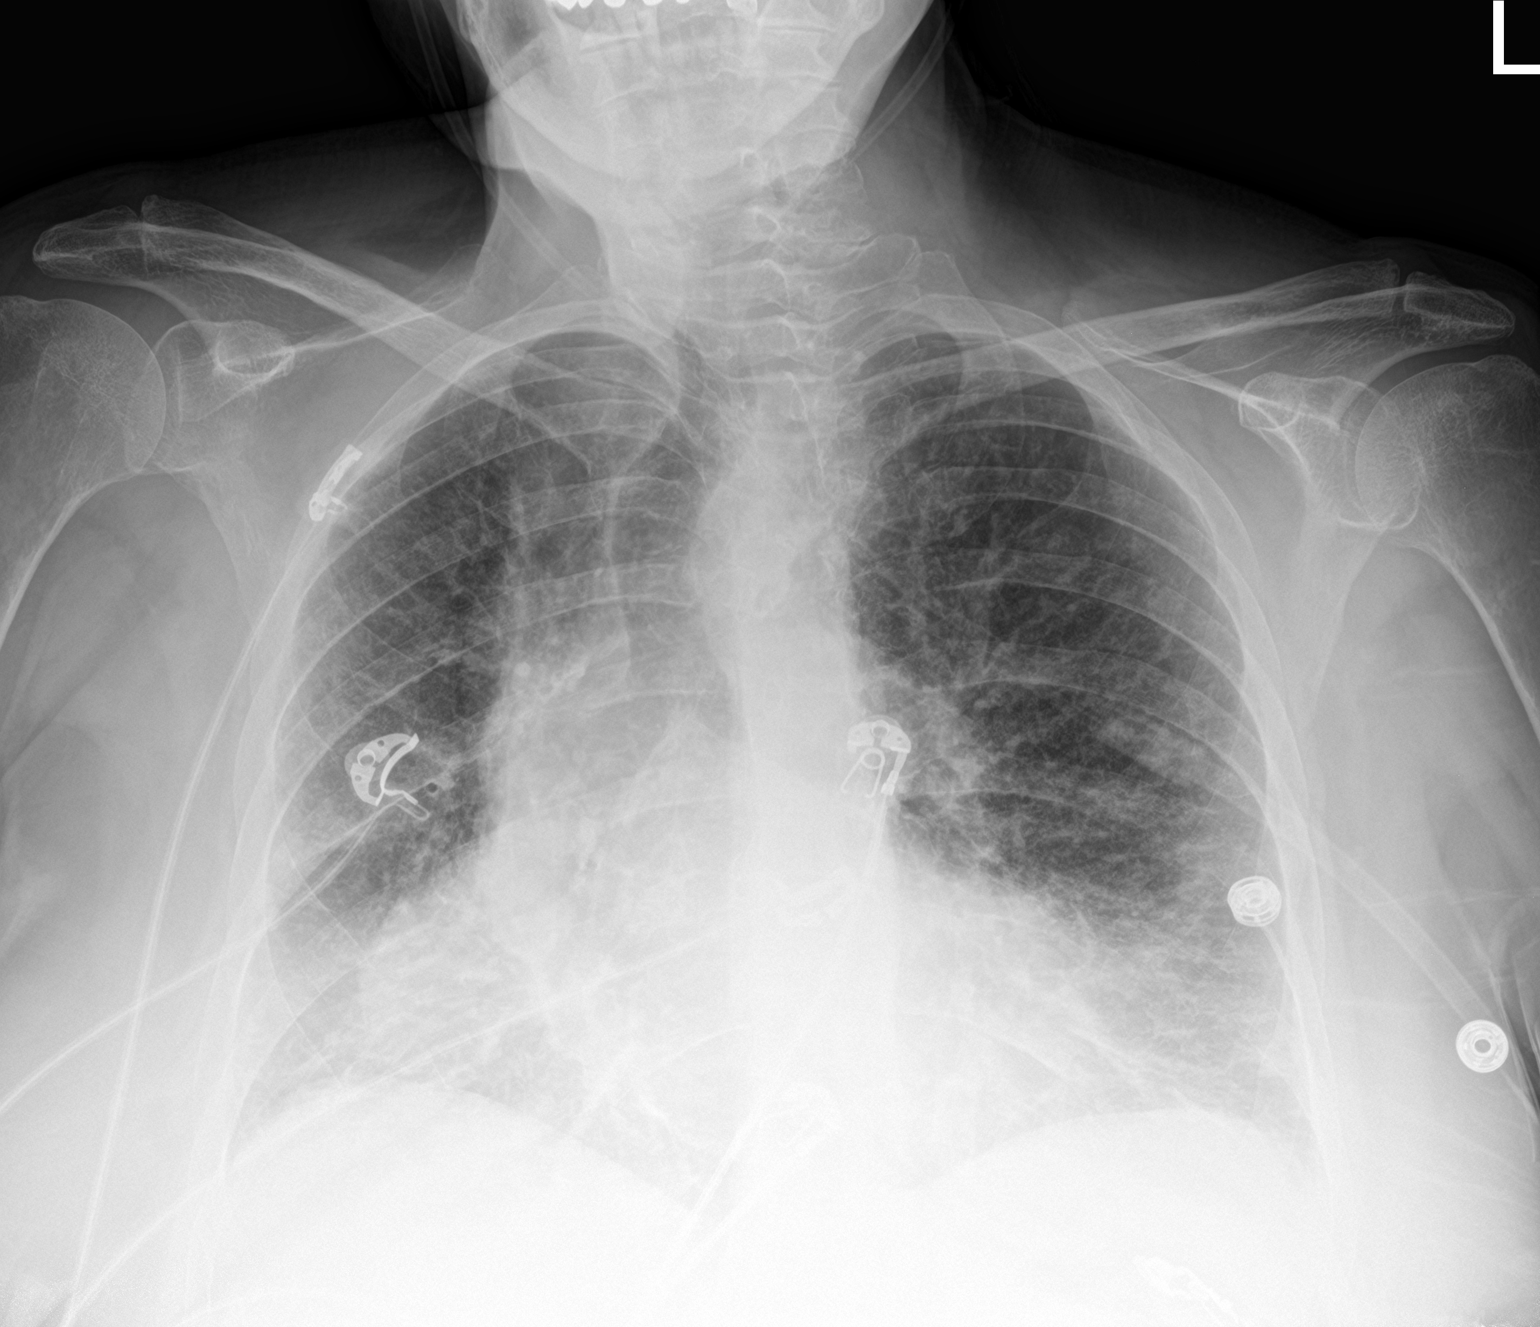

[2 of 2 positions shown; findings below may reference images not displayed]

FINDINGS: The heart and mediastinal contours are within normal limits.

Bibasilar atelectasis. Increased interstitial markings and Kerley B
lines. No pulmonary edema. Trace bilateral pleural effusions. No
pneumothorax.

No acute osseous abnormality.
IMPRESSION: 1. Pulmonary edema with trace bilateral pleural effusions.
2. Markedly rotated patient on frontal view due to patient rotation.
Recommend repeat PA radiograph.

## 2023-08-27 IMAGING — DX DG CHEST 1V PORT
1 series · 1 of 1 positions shown · non-contrast
Comparison: Same day PA and lateral chest x-ray.

CLINICAL DATA: Patient rotation

EXAM:
PORTABLE CHEST 1 VIEW

[chest ap]
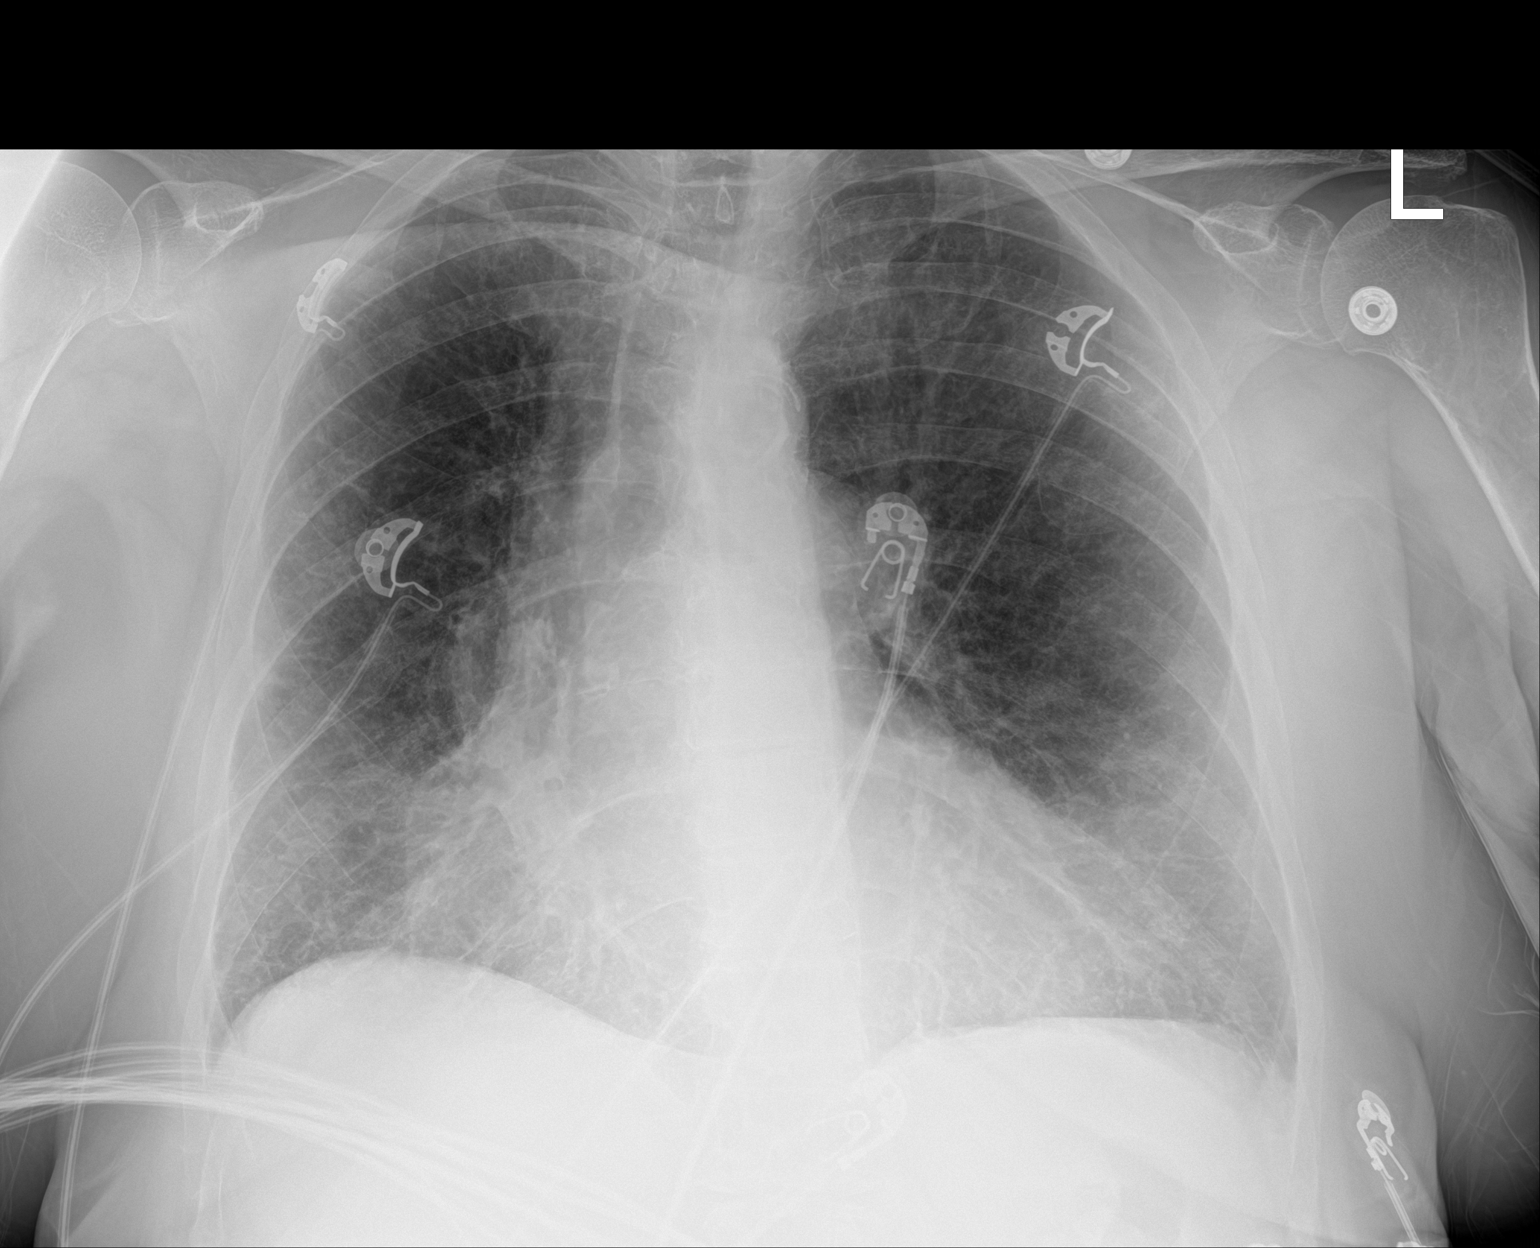

[1 of 1 positions shown; findings below may reference images not displayed]

FINDINGS: Mild cardiomegaly. Lower lung predominant interstitial opacities.
More focal linear opacities are seen in the right lower lung. No
evidence of pleural effusion or pneumothorax.
IMPRESSION: 1. Lower lung predominant interstitial opacities, likely due to
pulmonary edema.
2. More focal linear opacities are seen in the right lower lung,
possibly due to scarring. Recommend correlation with outside prior
imaging.

## 2023-08-27 IMAGING — CT CT ABD-PELV W/ CM
2 of 5 series · 16 of 46 positions shown, 18 images · IV contrast (Omni 300)
Comparison: None Available.

CLINICAL DATA: Abdominal pain.

EXAM:
CT ABDOMEN AND PELVIS WITH CONTRAST
TECHNIQUE: Multidetector CT imaging of the abdomen and pelvis was performed
using the standard protocol following bolus administration of
intravenous contrast.

[Series 3: a/p w/ 5mm · axial · 0.89mm/px · z∈[+649,+1044]mm · 13 of 89 slices shown, 15 images]
[im 5/89  soft-tissue]
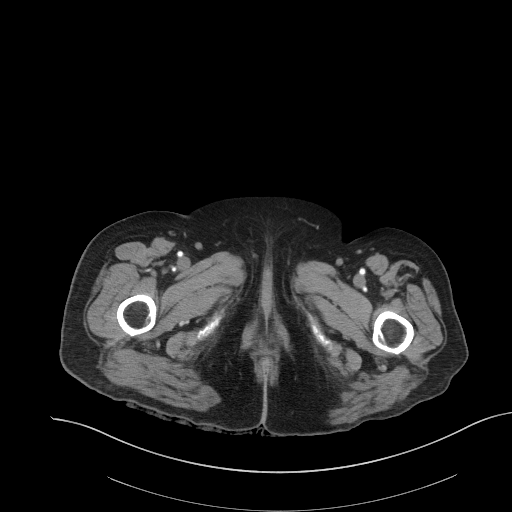
[im 5/89  bone]
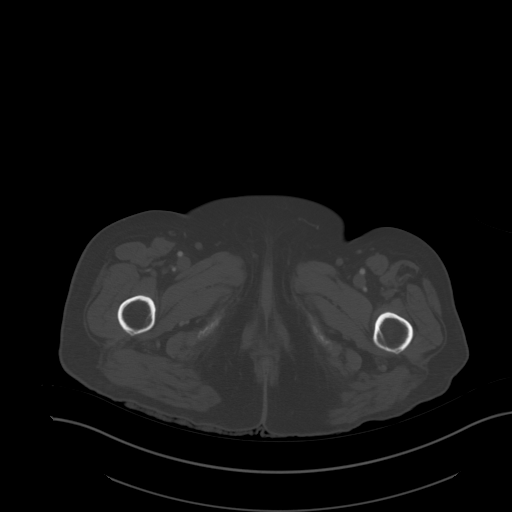
[im 14/89  soft-tissue]
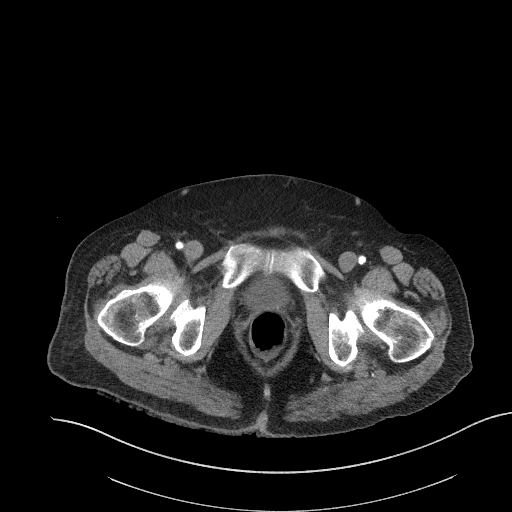
[im 19/89  soft-tissue]
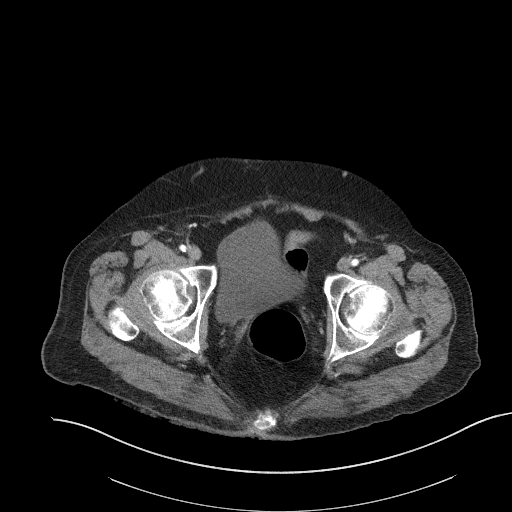
[im 24/89  soft-tissue]
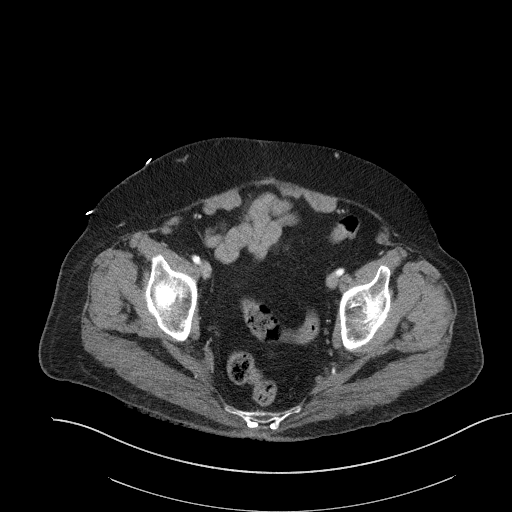
[im 33/89  soft-tissue]
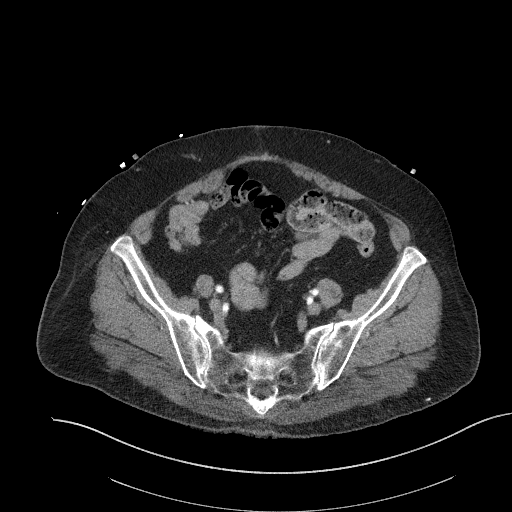
[im 38/89  soft-tissue]
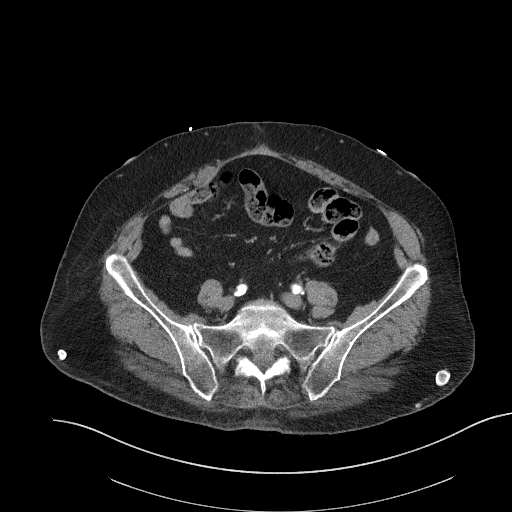
[im 47/89  soft-tissue]
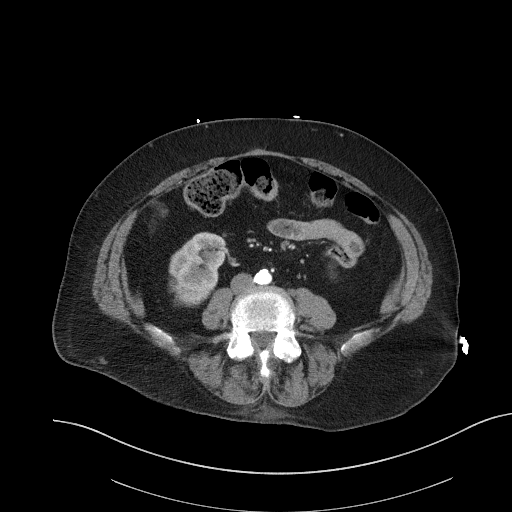
[im 51/89  soft-tissue]
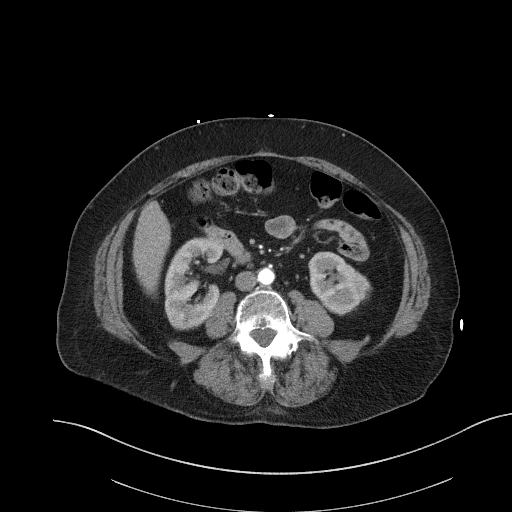
[im 56/89  soft-tissue]
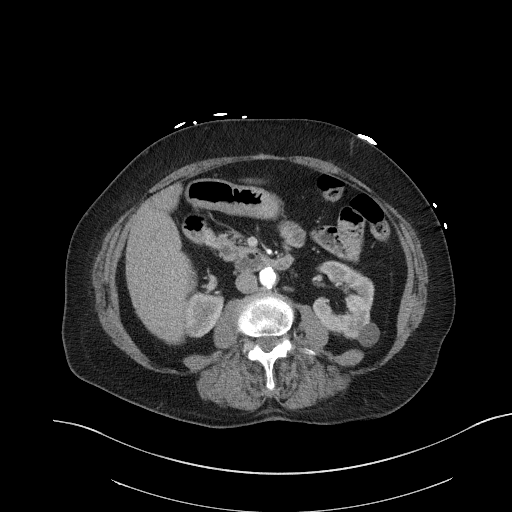
[im 56/89  bone]
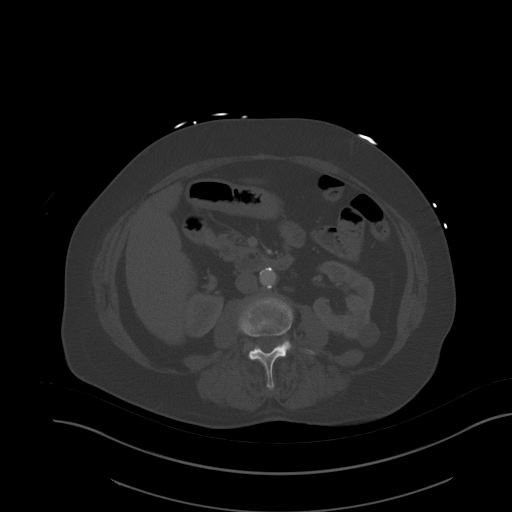
[im 65/89  soft-tissue]
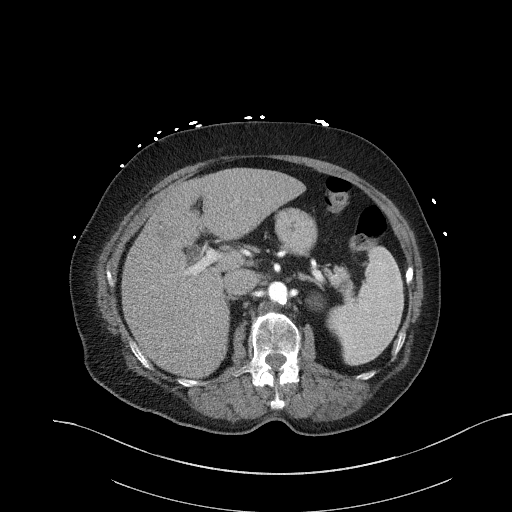
[im 70/89  soft-tissue]
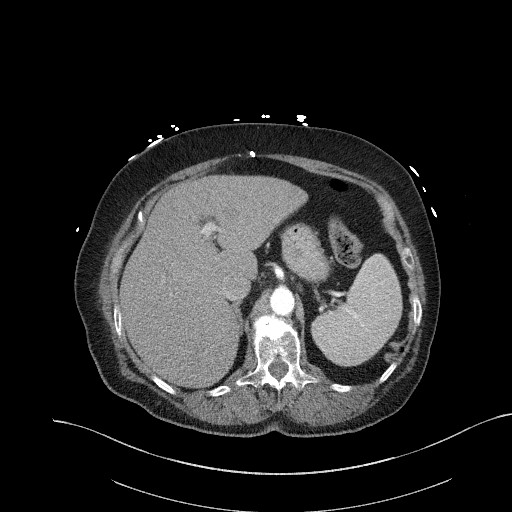
[im 75/89  soft-tissue]
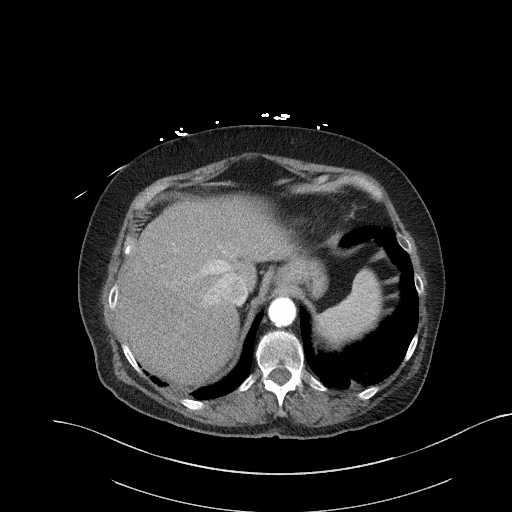
[im 84/89  soft-tissue]
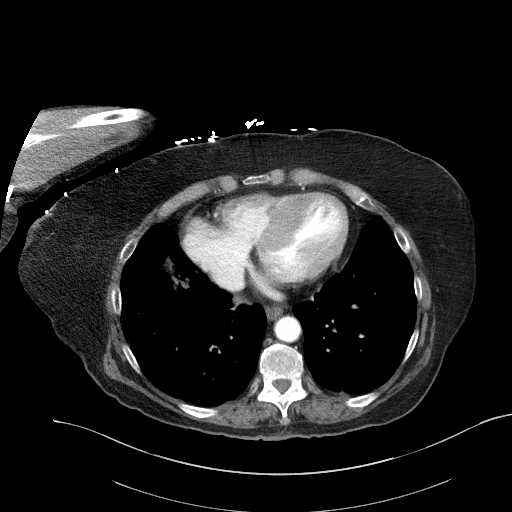

[Series 6: a/p w/ cor · coronal · 0.78mm/px · 3 of 152 slices shown]
[im 51/152  soft-tissue]
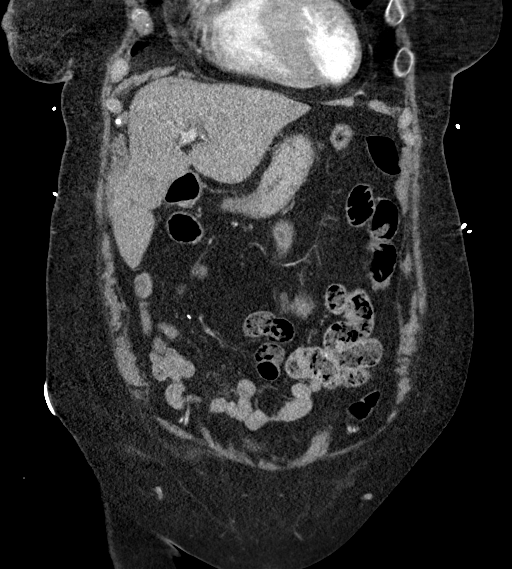
[im 68/152  soft-tissue]
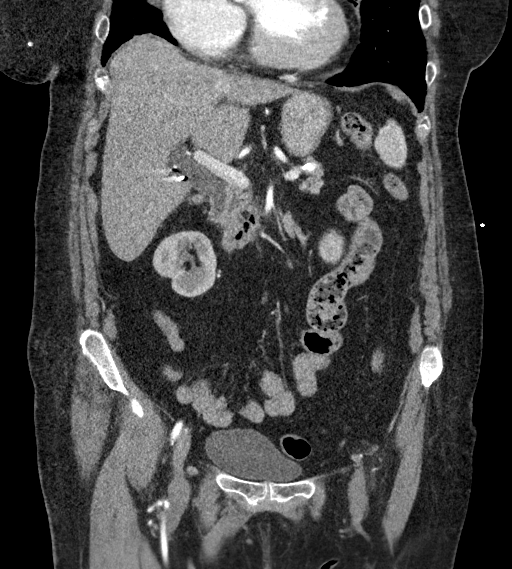
[im 84/152  soft-tissue]
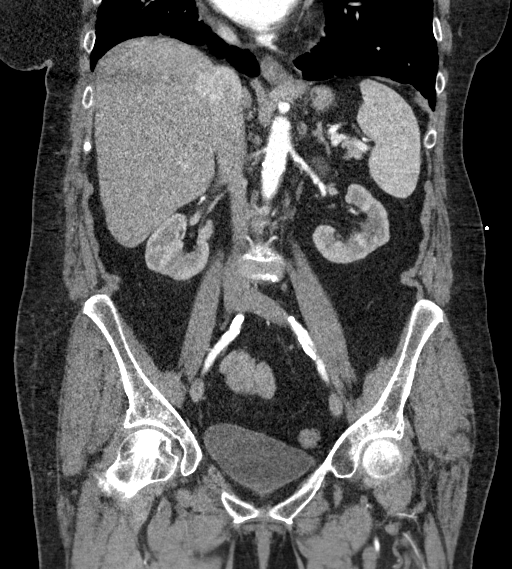

[16 of 46 positions shown; findings below may reference images not displayed]

RADIATION DOSE REDUCTION: This exam was performed according to the
departmental dose-optimization program which includes automated
exposure control, adjustment of the mA and/or kV according to
patient size and/or use of iterative reconstruction technique.

CONTRAST:  100mL OMNIPAQUE IOHEXOL 300 MG/ML  SOLN
FINDINGS: Lower chest: There are bibasilar linear atelectasis/scarring.
Cluster of nodularity at the right lung base may be chronic
scarring. Atypical infiltrate or aspiration is not excluded clinical
correlation is recommended. Trace pleural effusions suspected. There
is coronary vascular calcification.

No intra-abdominal free air or free fluid.

Hepatobiliary: The liver is unremarkable. There is mild dilatation
of the biliary tree, likely post cholecystectomy. No retained
calcified stone noted in the central CBD.

Pancreas: Unremarkable. No pancreatic ductal dilatation or
surrounding inflammatory changes.

Spleen: Normal in size without focal abnormality.

Adrenals/Urinary Tract: The adrenal glands are unremarkable. Several
small left renal cysts. There is no hydronephrosis on either side.
There is symmetric enhancement of the kidneys. The visualized
ureters and urinary bladder appear unremarkable.

Stomach/Bowel: There is postsurgical changes of right hemicolectomy
with ileotransverse anastomosis in the right upper abdomen. There is
sigmoid diverticulosis without active inflammatory changes. There is
no bowel obstruction or active inflammation. Appendectomy.

Vascular/Lymphatic: Mild aortoiliac atherosclerotic disease. The IVC
is unremarkable. No portal venous gas. There is no adenopathy.

Reproductive: Hysterectomy.  No adnexal masses.

Other: Midline vertical anterior pelvic wall incisional scar.

Musculoskeletal: Osteopenia with degenerative changes of the spine.
No acute osseous pathology.
IMPRESSION: 1. No acute intra-abdominal or pelvic pathology.
2. Sigmoid diverticulosis without active inflammatory changes.
3. Postsurgical changes of right hemicolectomy with ileotransverse
anastomosis in the right upper abdomen. No bowel obstruction.
4. Cluster of nodularity at the right lung base may be chronic
scarring. Atypical infiltrate or aspiration is not excluded clinical
correlation is recommended. Trace pleural effusions suspected.
5. Aortic Atherosclerosis ([id number].[id number]).

## 2023-09-05 ENCOUNTER — Emergency Department (HOSPITAL_COMMUNITY): Payer: Medicare Other

## 2023-09-05 ENCOUNTER — Inpatient Hospital Stay (HOSPITAL_COMMUNITY)
Admission: EM | Admit: 2023-09-05 | Discharge: 2023-09-11 | DRG: 871 | Disposition: A | Discharge status: Home / Self Care | Payer: Medicare Other | Attending: Internal Medicine | Admitting: Internal Medicine

## 2023-09-05 ENCOUNTER — Encounter (HOSPITAL_COMMUNITY): Payer: Self-pay

## 2023-09-05 ENCOUNTER — Other Ambulatory Visit: Payer: Self-pay

## 2023-09-05 DIAGNOSIS — I160 Hypertensive urgency: Secondary | ICD-10-CM | POA: Diagnosis not present

## 2023-09-05 DIAGNOSIS — I130 Hypertensive heart and chronic kidney disease with heart failure and stage 1 through stage 4 chronic kidney disease, or unspecified chronic kidney disease: Secondary | ICD-10-CM | POA: Diagnosis present

## 2023-09-05 DIAGNOSIS — I495 Sick sinus syndrome: Secondary | ICD-10-CM | POA: Diagnosis present

## 2023-09-05 DIAGNOSIS — I2510 Atherosclerotic heart disease of native coronary artery without angina pectoris: Secondary | ICD-10-CM | POA: Diagnosis present

## 2023-09-05 DIAGNOSIS — A4189 Other specified sepsis: Secondary | ICD-10-CM | POA: Diagnosis not present

## 2023-09-05 DIAGNOSIS — U071 COVID-19: Principal | ICD-10-CM | POA: Diagnosis present

## 2023-09-05 DIAGNOSIS — J9621 Acute and chronic respiratory failure with hypoxia: Secondary | ICD-10-CM | POA: Diagnosis not present

## 2023-09-05 DIAGNOSIS — E860 Dehydration: Secondary | ICD-10-CM | POA: Diagnosis not present

## 2023-09-05 DIAGNOSIS — G8929 Other chronic pain: Secondary | ICD-10-CM | POA: Diagnosis present

## 2023-09-05 DIAGNOSIS — N1831 Chronic kidney disease, stage 3a: Secondary | ICD-10-CM | POA: Diagnosis present

## 2023-09-05 DIAGNOSIS — Z87891 Personal history of nicotine dependence: Secondary | ICD-10-CM

## 2023-09-05 DIAGNOSIS — E861 Hypovolemia: Secondary | ICD-10-CM

## 2023-09-05 DIAGNOSIS — E875 Hyperkalemia: Secondary | ICD-10-CM | POA: Diagnosis present

## 2023-09-05 DIAGNOSIS — Z885 Allergy status to narcotic agent status: Secondary | ICD-10-CM

## 2023-09-05 DIAGNOSIS — K5090 Crohn's disease, unspecified, without complications: Secondary | ICD-10-CM | POA: Diagnosis present

## 2023-09-05 DIAGNOSIS — Z886 Allergy status to analgesic agent status: Secondary | ICD-10-CM

## 2023-09-05 DIAGNOSIS — M549 Dorsalgia, unspecified: Secondary | ICD-10-CM | POA: Diagnosis present

## 2023-09-05 DIAGNOSIS — R6520 Severe sepsis without septic shock: Secondary | ICD-10-CM | POA: Diagnosis present

## 2023-09-05 DIAGNOSIS — Z950 Presence of cardiac pacemaker: Secondary | ICD-10-CM

## 2023-09-05 DIAGNOSIS — E785 Hyperlipidemia, unspecified: Secondary | ICD-10-CM | POA: Diagnosis present

## 2023-09-05 DIAGNOSIS — A419 Sepsis, unspecified organism: Secondary | ICD-10-CM | POA: Diagnosis not present

## 2023-09-05 DIAGNOSIS — Z880 Allergy status to penicillin: Secondary | ICD-10-CM

## 2023-09-05 DIAGNOSIS — J441 Chronic obstructive pulmonary disease with (acute) exacerbation: Secondary | ICD-10-CM | POA: Diagnosis present

## 2023-09-05 DIAGNOSIS — Z888 Allergy status to other drugs, medicaments and biological substances status: Secondary | ICD-10-CM

## 2023-09-05 DIAGNOSIS — Z79899 Other long term (current) drug therapy: Secondary | ICD-10-CM

## 2023-09-05 DIAGNOSIS — F419 Anxiety disorder, unspecified: Secondary | ICD-10-CM | POA: Diagnosis present

## 2023-09-05 DIAGNOSIS — I5032 Chronic diastolic (congestive) heart failure: Secondary | ICD-10-CM | POA: Diagnosis present

## 2023-09-05 DIAGNOSIS — K219 Gastro-esophageal reflux disease without esophagitis: Secondary | ICD-10-CM | POA: Diagnosis present

## 2023-09-05 DIAGNOSIS — R509 Fever, unspecified: Secondary | ICD-10-CM

## 2023-09-05 DIAGNOSIS — Z7951 Long term (current) use of inhaled steroids: Secondary | ICD-10-CM

## 2023-09-05 DIAGNOSIS — N179 Acute kidney failure, unspecified: Secondary | ICD-10-CM | POA: Diagnosis present

## 2023-09-05 LAB — CBC WITH DIFFERENTIAL/PLATELET
Abs Immature Granulocytes: 0.04 10*3/uL (ref 0.00–0.07)
Basophils Absolute: 0.1 10*3/uL (ref 0.0–0.1)
Basophils Relative: 1 %
Eosinophils Absolute: 0.1 10*3/uL (ref 0.0–0.5)
Eosinophils Relative: 1 %
HCT: 36.6 % (ref 36.0–46.0)
Hemoglobin: 11.5 g/dL — ABNORMAL LOW (ref 12.0–15.0)
Immature Granulocytes: 0 %
Lymphocytes Relative: 4 %
Lymphs Abs: 0.4 10*3/uL — ABNORMAL LOW (ref 0.7–4.0)
MCH: 29.8 pg (ref 26.0–34.0)
MCHC: 31.4 g/dL (ref 30.0–36.0)
MCV: 94.8 fL (ref 80.0–100.0)
Monocytes Absolute: 0.5 10*3/uL (ref 0.1–1.0)
Monocytes Relative: 5 %
Neutro Abs: 8.4 10*3/uL — ABNORMAL HIGH (ref 1.7–7.7)
Neutrophils Relative %: 89 %
Platelets: 174 10*3/uL (ref 150–400)
RBC: 3.86 MIL/uL — ABNORMAL LOW (ref 3.87–5.11)
RDW: 13.3 % (ref 11.5–15.5)
WBC: 9.5 10*3/uL (ref 4.0–10.5)
nRBC: 0 % (ref 0.0–0.2)

## 2023-09-05 LAB — COMPREHENSIVE METABOLIC PANEL
ALT: 11 U/L (ref 0–44)
AST: 33 U/L (ref 15–41)
Albumin: 3.8 g/dL (ref 3.5–5.0)
Alkaline Phosphatase: 32 U/L — ABNORMAL LOW (ref 38–126)
Anion gap: 9 (ref 5–15)
BUN: 28 mg/dL — ABNORMAL HIGH (ref 8–23)
CO2: 25 mmol/L (ref 22–32)
Calcium: 8.8 mg/dL — ABNORMAL LOW (ref 8.9–10.3)
Chloride: 102 mmol/L (ref 98–111)
Creatinine, Ser: 1.4 mg/dL — ABNORMAL HIGH (ref 0.44–1.00)
GFR, Estimated: 38 mL/min — ABNORMAL LOW (ref >60)
Glucose, Bld: 119 mg/dL — ABNORMAL HIGH (ref 70–99)
Potassium: 5.2 mmol/L — ABNORMAL HIGH (ref 3.5–5.1)
Sodium: 136 mmol/L (ref 135–145)
Total Bilirubin: 0.6 mg/dL (ref 0.0–1.2)
Total Protein: 7.1 g/dL (ref 6.5–8.1)

## 2023-09-05 LAB — LIPASE, BLOOD: Lipase: 32 U/L (ref 11–51)

## 2023-09-05 LAB — RESP PANEL BY RT-PCR (RSV, FLU A&B, COVID)  RVPGX2
Influenza A by PCR: NEGATIVE
Influenza B by PCR: NEGATIVE
Resp Syncytial Virus by PCR: NEGATIVE
SARS Coronavirus 2 by RT PCR: POSITIVE — AB

## 2023-09-05 LAB — URINALYSIS, ROUTINE W REFLEX MICROSCOPIC
Bacteria, UA: NONE SEEN
Bilirubin Urine: NEGATIVE
Glucose, UA: NEGATIVE mg/dL
Hgb urine dipstick: NEGATIVE
Ketones, ur: NEGATIVE mg/dL
Nitrite: NEGATIVE
Protein, ur: NEGATIVE mg/dL
Specific Gravity, Urine: 1.018 (ref 1.005–1.030)
pH: 7 (ref 5.0–8.0)

## 2023-09-05 LAB — I-STAT CG4 LACTIC ACID, ED: Lactic Acid, Venous: 1.6 mmol/L (ref 0.5–1.9)

## 2023-09-05 MED ORDER — SENNOSIDES-DOCUSATE SODIUM 8.6-50 MG PO TABS: 1.0000 | ORAL_TABLET | Freq: Every evening | ORAL | Status: DC | PRN

## 2023-09-05 MED ORDER — FLUTICASONE FUROATE-VILANTEROL 200-25 MCG/ACT IN AEPB
1.0000 | INHALATION_SPRAY | Freq: Every day | RESPIRATORY_TRACT | Status: DC
  Filled 2023-09-05: qty 28

## 2023-09-05 MED ORDER — CYCLOBENZAPRINE HCL 5 MG PO TABS
5.0000 mg | ORAL_TABLET | Freq: Two times a day (BID) | ORAL | Status: DC | PRN
  Administered 2023-09-05 – 2023-09-10 (×5): 5 mg via ORAL
  Filled 2023-09-05 (×5): qty 1

## 2023-09-05 MED ORDER — EZETIMIBE 10 MG PO TABS
10.0000 mg | ORAL_TABLET | Freq: Every day | ORAL | Status: DC
  Administered 2023-09-06 – 2023-09-10 (×6): 10 mg via ORAL
  Filled 2023-09-05 (×7): qty 1

## 2023-09-05 MED ORDER — ONDANSETRON HCL 4 MG PO TABS: 4.0000 mg | ORAL_TABLET | Freq: Four times a day (QID) | ORAL | Status: DC | PRN

## 2023-09-05 MED ORDER — MELATONIN 3 MG PO TABS
3.0000 mg | ORAL_TABLET | Freq: Every evening | ORAL | Status: DC | PRN
  Administered 2023-09-07 – 2023-09-10 (×4): 3 mg via ORAL
  Filled 2023-09-05 (×4): qty 1

## 2023-09-05 MED ORDER — POTASSIUM CHLORIDE CRYS ER 20 MEQ PO TBCR
20.0000 meq | EXTENDED_RELEASE_TABLET | Freq: Every day | ORAL | Status: DC
  Administered 2023-09-06 – 2023-09-11 (×6): 20 meq via ORAL
  Filled 2023-09-05 (×6): qty 1

## 2023-09-05 MED ORDER — ONDANSETRON HCL 4 MG/2ML IJ SOLN
4.0000 mg | Freq: Four times a day (QID) | INTRAMUSCULAR | Status: DC | PRN
Start: 2023-09-05 — End: 2023-09-11

## 2023-09-05 MED ORDER — LEVALBUTEROL TARTRATE 45 MCG/ACT IN AERO: 2.0000 | INHALATION_SPRAY | Freq: Four times a day (QID) | RESPIRATORY_TRACT | Status: DC | PRN

## 2023-09-05 MED ORDER — ACETAMINOPHEN 650 MG RE SUPP
650.0000 mg | Freq: Four times a day (QID) | RECTAL | Status: DC | PRN
  Filled 2023-09-05: qty 1

## 2023-09-05 MED ORDER — UMECLIDINIUM BROMIDE 62.5 MCG/ACT IN AEPB
1.0000 | INHALATION_SPRAY | Freq: Every day | RESPIRATORY_TRACT | Status: DC
  Filled 2023-09-05: qty 7

## 2023-09-05 MED ORDER — SODIUM CHLORIDE 0.9 % IV SOLN
100.0000 mg | Freq: Every day | INTRAVENOUS | Status: AC
  Administered 2023-09-06 – 2023-09-07 (×2): 100 mg via INTRAVENOUS
  Filled 2023-09-05 (×2): qty 20

## 2023-09-05 MED ORDER — FENOFIBRATE 160 MG PO TABS
160.0000 mg | ORAL_TABLET | Freq: Every day | ORAL | Status: DC
  Administered 2023-09-06 – 2023-09-11 (×6): 160 mg via ORAL
  Filled 2023-09-05 (×8): qty 1

## 2023-09-05 MED ORDER — PREGABALIN 50 MG PO CAPS
50.0000 mg | ORAL_CAPSULE | Freq: Two times a day (BID) | ORAL | Status: DC
  Administered 2023-09-05 – 2023-09-11 (×12): 50 mg via ORAL
  Filled 2023-09-05 (×2): qty 1
  Filled 2023-09-05 (×4): qty 2
  Filled 2023-09-05 (×3): qty 1
  Filled 2023-09-05: qty 2
  Filled 2023-09-05: qty 1
  Filled 2023-09-05: qty 2

## 2023-09-05 MED ORDER — SODIUM CHLORIDE 0.9 % IV BOLUS
500.0000 mL | Freq: Once | INTRAVENOUS | Status: AC
  Administered 2023-09-06: 500 mL via INTRAVENOUS

## 2023-09-05 MED ORDER — ACETAMINOPHEN 325 MG PO TABS
650.0000 mg | ORAL_TABLET | Freq: Once | ORAL | Status: AC | PRN
  Administered 2023-09-05: 650 mg via ORAL
  Filled 2023-09-05: qty 2

## 2023-09-05 MED ORDER — DICLOFENAC SODIUM 1 % EX GEL
2.0000 g | Freq: Two times a day (BID) | CUTANEOUS | Status: DC | PRN
  Administered 2023-09-10: 2 g via TOPICAL
  Filled 2023-09-05: qty 100

## 2023-09-05 MED ORDER — SODIUM CHLORIDE 0.9 % IV SOLN
200.0000 mg | Freq: Once | INTRAVENOUS | Status: AC
  Administered 2023-09-05: 200 mg via INTRAVENOUS
  Filled 2023-09-05: qty 40

## 2023-09-05 MED ORDER — PANTOPRAZOLE SODIUM 40 MG PO TBEC
40.0000 mg | DELAYED_RELEASE_TABLET | Freq: Every day | ORAL | Status: DC
  Administered 2023-09-06 – 2023-09-11 (×6): 40 mg via ORAL
  Filled 2023-09-05 (×6): qty 1

## 2023-09-05 MED ORDER — ACETAMINOPHEN 325 MG PO TABS
650.0000 mg | ORAL_TABLET | Freq: Four times a day (QID) | ORAL | Status: DC | PRN
  Administered 2023-09-06 – 2023-09-10 (×8): 650 mg via ORAL
  Filled 2023-09-05 (×8): qty 2

## 2023-09-05 MED ORDER — ALPRAZOLAM 0.5 MG PO TABS
1.0000 mg | ORAL_TABLET | Freq: Three times a day (TID) | ORAL | Status: DC | PRN
  Administered 2023-09-06 – 2023-09-11 (×12): 1 mg via ORAL
  Filled 2023-09-05 (×14): qty 2

## 2023-09-05 MED ORDER — ENOXAPARIN SODIUM 30 MG/0.3ML IJ SOSY
30.0000 mg | PREFILLED_SYRINGE | INTRAMUSCULAR | Status: DC
  Administered 2023-09-05: 30 mg via SUBCUTANEOUS
  Filled 2023-09-05: qty 0.3

## 2023-09-05 MED ORDER — ATENOLOL 50 MG PO TABS: 25.0000 mg | ORAL_TABLET | Freq: Every day | ORAL | Status: DC

## 2023-09-05 MED ORDER — SODIUM CHLORIDE 0.9 % IV BOLUS
500.0000 mL | Freq: Once | INTRAVENOUS | Status: AC
  Administered 2023-09-05: 500 mL via INTRAVENOUS

## 2023-09-05 NOTE — H&P (Addendum)
 History and Physical  Brittany Irwin GEX:[id number] DOB: 1941-03-06 DOA: 09/05/2023  PCP: Teodoro Spray, MD   Chief Complaint: AMS, fevers, cough, generalized weakness  HPI: Brittany Irwin is a 83 y.o. female with medical history significant for hypertension, chronic diastolic CHF, s/p PPM, COPD, chronic respiratory failure with hypoxia on 3 L nasal cannula at baseline, CKD 3A, small bowel obstruction, Crohn's disease, chronic back pain, anxiety, and anemia who presents to the ED for evaluation of altered mental status, generalized weakness, cough and fever. Patient reports she has some dry cough and sinus congestion when she was discharged from [facility] about 3 weeks ago. Over the last 2 to 3 days, the cough has become intermittently productive with dark sputum. She also reports generalized weakness and chills. Patient reports that earlier today, her medical alert went off and she attempted to call the company to fix and while on the phone with them, they advised her to press the help button which alerted EMS. She did not intentionally try to call EMS. On EMS arrival, patient was found to be slightly disoriented.  Per sister, patient seemed slightly confused to her earlier today but her mental status seems to be at her baseline now. Patient endorsed fever and dry mouth today but denies any nausea, vomiting, shortness of breath, chest pain, palpitations, dysuria, diarrhea, constipation, leg swelling or dizziness.  ED Course: Initial vitals showed temp 102.7, RR 16, HR 104, BP 107/68 and SpO2 94% on 4 L.  Labs significant for positive COVID test, normal white count, K+ 5.2, normal lactic acid, creatinine 1.40, normal lipase, UA shows negative nitrite, small leuks, WBC 11-20, and no bacteria. CXR with no acute cardiopulmonary disease. CT head with no acute intracranial abnormality. Patient received Tylenol 650 mg x 1 and IV NS 500 cc bolus Which was consulted for admission  Review of Systems: Please  see HPI for pertinent positives and negatives. A complete 10 system review of systems are otherwise negative.  Past Medical History:  Diagnosis Date   Acute CHF (congestive heart failure) (HCC) 01/02/2022   AKI (acute kidney injury) (HCC) 09/08/2020   Aspiration pneumonia (HCC) 01/05/2019   Atypical chest pain 03/11/2022   Bradycardia 03/14/2020   C. difficile colitis 04/03/2021   CHF (congestive heart failure) (HCC)    Coronary artery disease    Tachycardia 05/31/2013   Past Surgical History:  Procedure Laterality Date   ABDOMINAL HYSTERECTOMY     BOWEL RESECTION     CHOLECYSTECTOMY     Social History:  reports that she has quit smoking. Her smoking use included cigarettes. She does not have any smokeless tobacco history on file. She reports that she does not currently use alcohol. She reports that she does not use drugs.  Allergies  Allergen Reactions   Methylprednisolone Other (See Comments)    Increased BP, HR, agitation, hallucinations    Azulfidine [Sulfasalazine] Other (See Comments)    Headache    Epipen [Epinephrine] Hypertension   Flagyl [Metronidazole] Hives   Motrin [Ibuprofen] Nausea Only and Other (See Comments)    Told not to take medication due to GI distress caused by crohn's disease.   Nsaids Other (See Comments)    Told not to take NSAIDs due to GI distress caused by crohn's disease   Penicillins Hives   Morphine And Codeine Itching    Told not to take due to GI distress caused by crohn's disease    History reviewed. No pertinent family history.   Prior to Admission  medications   Medication Sig Start Date End Date Taking? Authorizing Provider  acetaminophen-codeine (TYLENOL #4) 300-60 MG tablet Take 1 tablet by mouth 4 (four) times daily as needed for pain. 12/31/21   [provider]  Aloe-Sodium Chloride (AYR SALINE NASAL GEL NA) Place 1 Application into the nose 2 (two) times daily as needed (Dry nose).    [provider]  ALPRAZolam  Prudy Feeler) 1 MG tablet Take 1 mg by mouth 3 (three) times daily as needed for anxiety. 01/02/22   [provider]  atenolol (TENORMIN) 25 MG tablet Take half tablet (12.5 mg) by mouth daily. DO NOT take if heart rate is less than 60 BPM Patient taking differently: Take 25 mg by mouth daily. DO NOT take if heart rate is less than 60 BPM 10/21/22   Jonita Albee, PA-C  bumetanide (BUMEX) 1 MG tablet Take 1 tablet (1 mg total) by mouth daily as needed. 08/15/23   Dahal, Melina Schools, MD  cyanocobalamin (,VITAMIN B-12,) 1000 MCG/ML injection Inject 1,000 mcg into the muscle every 30 (thirty) days.    [provider]  cyclobenzaprine (FLEXERIL) 5 MG tablet Take 5 mg by mouth 2 (two) times daily as needed for muscle spasms. 10/13/22   [provider]  diclofenac Sodium (VOLTAREN ARTHRITIS PAIN) 1 % GEL Apply 1 Application topically 2 (two) times daily as needed (back pain).    [provider]  ergocalciferol (VITAMIN D2) 1.25 MG (50000 UT) capsule Take 50,000 Units by mouth every 14 (fourteen) days. Every other Friday    [provider]  ezetimibe (ZETIA) 10 MG tablet Take 10 mg by mouth at bedtime. 12/31/21   [provider]  fenofibrate micronized (LOFIBRA) 200 MG capsule Take 200 mg by mouth daily. 12/31/21   [provider]  Fluticasone-Umeclidin-Vilant (TRELEGY ELLIPTA) 200-62.5-25 MCG/ACT AEPB Inhale 1 puff into the lungs daily. 08/31/22     levalbuterol (XOPENEX HFA) 45 MCG/ACT inhaler Inhale 2 puffs into the lungs every 6 (six) hours as needed for shortness of breath or wheezing. 12/23/21   [provider]  losartan (COZAAR) 25 MG tablet Take 0.5 tablets (12.5 mg total) by mouth daily. Patient taking differently: Take 25 mg by mouth daily. 10/21/22   Jonita Albee, PA-C  melatonin 3 MG TABS tablet Take 1 tablet (3 mg total) by mouth at bedtime. 08/15/23   DahalMelina Schools, MD  Menthol, Topical Analgesic, (ICY HOT BACK EX) Apply 1 Application  topically 2 (two) times daily as needed (back pain).    [provider]  nitroGLYCERIN (NITROSTAT) 0.4 MG SL tablet Place 0.4 mg under the tongue every 5 (five) minutes x 3 doses as needed for chest pain. 12/31/21   [provider]  omeprazole (PRILOSEC) 40 MG capsule Take 40 mg by mouth daily.    [provider]  potassium chloride SA (KLOR-CON M) 20 MEQ tablet Take 20 mEq by mouth daily. 12/31/21   [provider]  pregabalin (LYRICA) 50 MG capsule Take 50 mg by mouth 2 (two) times daily.    [provider]    Physical Exam: BP (!) 92/56 (BP Location: Right Arm)   Pulse 64   Temp 99 F (37.2 C) (Oral)   Resp 18   Ht 5\' 4"  (1.626 m)   Wt 68 kg   SpO2 94%   BMI 25.73 kg/m  General: Pleasant, well-appearing elderly woman laying in bed. No acute distress. HEENT: [place]/AT. Anicteric sclera. Dry mucous membrane. CV: Tachycardic. Regular rhythm  with occasional extra beats. No murmurs, rubs, or gallops. No LE edema Pulmonary: On 4 L [place]. Lungs CTAB. Normal effort. No wheezing or rales. Abdominal: Soft, nontender, nondistended. Normal bowel sounds. Extremities: Palpable radial and DP pulses. Normal ROM. Skin: Warm and dry. No obvious rash or lesions. Neuro: A&Ox3. Moves all extremities. Normal sensation to light touch. No focal deficit. Psych: Normal mood and affect          Labs on Admission:  Basic Metabolic Panel: Recent Labs  Lab 09/05/23 1755  NA 136  K 5.2*  CL 102  CO2 25  GLUCOSE 119*  BUN 28*  CREATININE 1.40*  CALCIUM 8.8*   Liver Function Tests: Recent Labs  Lab 09/05/23 1755  AST 33  ALT 11  ALKPHOS 32*  BILITOT 0.6  PROT 7.1  ALBUMIN 3.8   Recent Labs  Lab 09/05/23 1755  LIPASE 32   No results for input(s): "AMMONIA" in the last 168 hours. CBC: Recent Labs  Lab 09/05/23 1755  WBC 9.5  NEUTROABS 8.4*  HGB 11.5*  HCT 36.6  MCV 94.8  PLT 174   Cardiac Enzymes: No results for input(s): "CKTOTAL", "CKMB",  "CKMBINDEX", "TROPONINI" in the last 168 hours. BNP (last 3 results) Recent Labs    10/14/22 0900  BNP 321.6*    ProBNP (last 3 results) No results for input(s): "PROBNP" in the last 8760 hours.  CBG: No results for input(s): "GLUCAP" in the last 168 hours.  Radiological Exams on Admission: DG Chest Port 1 View Result Date: 09/05/2023 CLINICAL DATA:  fever EXAM: PORTABLE CHEST 1 VIEW COMPARISON:  August 11, 2023 FINDINGS: Evaluation is limited by rotation. The cardiomediastinal silhouette is unchanged and enlarged in contour with similar prominence of the RIGHT pericardial border.LEFT chest cardiac pacing device. No pleural effusion. No pneumothorax. No acute pleuroparenchymal abnormality. IMPRESSION: No acute cardiopulmonary abnormality. Electronically Signed   By: Meda Klinefelter M.D.   On: 09/05/2023 19:44   CT Head Wo Contrast Result Date: 09/05/2023 CLINICAL DATA:  Mental status change, unknown cause EXAM: CT HEAD WITHOUT CONTRAST TECHNIQUE: Contiguous axial images were obtained from the base of the skull through the vertex without intravenous contrast. RADIATION DOSE REDUCTION: This exam was performed according to the departmental dose-optimization program which includes automated exposure control, adjustment of the mA and/or kV according to patient size and/or use of iterative reconstruction technique. COMPARISON:  None Available. FINDINGS: Brain: No evidence of acute infarction, hemorrhage, hydrocephalus, extra-axial collection or mass lesion/mass effect. Vascular: No hyperdense vessel. Skull: No acute fracture. Sinuses/Orbits: Clear sinuses.  No acute orbital findings. Other: No mastoid effusions. IMPRESSION: No evidence of acute intracranial abnormality. Electronically Signed   By: Feliberto Harts M.D.   On: 09/05/2023 19:40   Assessment/Plan Brittany Irwin is a 83 y.o. female with medical history significant for hypertension, chronic diastolic CHF, s/p PPM, COPD, chronic  respiratory failure with hypoxia on 3 L nasal cannula at baseline, CKD 3A, small bowel obstruction, Crohn's disease, chronic back pain, anxiety, and anemia who presents to the ED for evaluation of altered mental status, generalized weakness, cough and fever and admitted for sepsis secondary to COVID infection.  # Sepsis # COVID-19 infection Patient with history of COPD and heart failure presented with 2 to 3 days of productive cough, fevers and chills found to be positive for COVID on admission.  Patient meets sepsis criteria with fever, tachycardia, hypotension and evidence of respiratory infection. She also has increased oxygen requirement from 3 L at baseline to  4 L on admission. CXR without any infiltrate. BP still soft but maintaining MAP above 65. She is alert and oriented x 3. -Admit to telemetry bed -Start IV remdesivir 200 mg x 1 followed by 100 mg x 2 doses -Will hold off steroids as patient has history of agitation and hallucinations with steroids in the past -Intermittent IVF boluses to maintain MAP > 65 -Contact and droplet precautions -Flutter valve, incentive spirometer -Follow-up blood culture -Trend CBC and fever curve  # AKI on CKD 3A Baseline creatinine around 1.0-1.2. Creatinine slightly elevated to 1.4 on admission likely secondary to hypovolemia from viral infection and an oral diuresing with daily Bumex. S/p IV NS 500 cc bolus in the ED. -Give additional 500 cc IV NS bolus -Avoid nephrotoxic's agents -Trend renal function  # Hypotension Patient's BP initially soft at 107/68 on admission but dropped to SBP in the 80s to 90s.  Status post IV NS 500 cc in the ED.  BP remains soft with SBP in the 90s to 100s. -IV fluid as above -Intermittent IVF boluses to maintain MAP > 65 -Hold BP meds today, resume as appropriate  # Acute on chronic respiratory failure with hypoxia # COPD without acute exacerbation On baseline 3 L [place] but had increased O2 requirement to 4 L [place] on  admission in the setting of her COVID infection. She is in no respiratory distress and without wheezing or rales on exam. -Continue supplemental O2 -Continue Breo and Incruse Ellipta (substitute for home Trelegy) -Continue as needed levalbuterol inhaler -Incentive spirometer, flutter valve  # HFpEF TTE last year showed EF 60 to 65% with G1DD. Patient slightly dry on exam. Reports taking Bumex daily. -Hold Bumex for today, resume as appropriate -Continue potassium supplementation -Continue supplemental O2 -Strict I&O's, daily weights  # Tachybradycardia syndrome s/p PPM Patient had pacemaker placed in August 2024. Patient with occasional PVCs on telemetry while in the ED. -Follow up EKG -Monitor on tele closely as remdesivir can rarely cause bradycardia -Check mag and Phos  # Crohn's disease Reports she has been having normal bowel movement and has not had any issues with her abdomen or BM since discharge from the hospital. -Outpatient follow-up with gastroenterology  # Anxiety -Continue as needed Xanax -Melatonin as needed for sleep  # Chronic back pain -Continue Voltaren gel and as needed Flexeril  # Neuropathy -Continue Lyrica  # HLD -Continue fenofibrate and Zetia  # GERD -PPI  DVT prophylaxis: Lovenox     Code Status: Full Code  Consults called: None  Family Communication: Discussed admission with sister at bedside  Severity of Illness: The appropriate patient status for this patient is OBSERVATION. Observation status is judged to be reasonable and necessary in order to provide the required intensity of service to ensure the patient's safety. The patient's presenting symptoms, physical exam findings, and initial radiographic and laboratory data in the context of their medical condition is felt to place them at decreased risk for further clinical deterioration. Furthermore, it is anticipated that the patient will be medically stable for discharge from the hospital  within 2 midnights of admission.   Level of care:  Med telemetry  Steffanie Rainwater, MD 09/05/2023, 8:27 PM [facility] Pager: [phone] Isaiah 41:10   If 7PM-7AM, please contact night-coverage [email or web address] Password [email or web address]

## 2023-09-05 NOTE — ED Provider Notes (Addendum)
 [place] EMERGENCY DEPARTMENT AT [facility] Provider Note   CSN: [id number] Arrival date & time: 09/05/23  1727     History  Chief Complaint  Patient presents with   Weakness   Fatigue   Cough   Fever    Brittany Irwin is a 83 y.o. female.  Patient brought in by EMS from home.  Her medical alert signified them coming.  Patient claims she did not press it.  But she seems to be confused has a temp here of 102.7.  Heart rate 104 respirations 20 blood pressure 122/68 oxygen saturation 94% on 4 L nasal cannula.  Patient is normally on 3 L nasal cannula at home all the time.  Past medical history sniffing congestive heart failure tachybradycardia syndrome.  Patient's had abdominal hysterectomy gallbladder removed.  Patient seems confused so therefore difficult to trust her history.  Patient recent admission this year January 20 2 January 26.  Patient was admitted at that time for concerns for small bowel obstruction.  According to that admission note patient has a history of hypertension coronary disease congestive heart failure tachybradycardia syndrome chronic kidney disease COPD chronic respiratory failure with hypoxia on 3 L nasal cannula at baseline.  Chronic back pain anxiety.  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  Most recent surgery was 10 years ago.  Most recent SBO was in 2022.  Patient lives by herself.  Uses a walker.  Patient presented for that admission with abdominal plain bloating.  CT imaging done showed small bowel loops in the pelvis here for fluid-filled and dilated up to 4.2 cm suggestive of bowel obstruction.  As of her general surgery was consulted.  According to their notes things resolved on their own.  Patient has a history of anxiety..       Home Medications Prior to Admission medications   Medication Sig Start Date End Date Taking? Authorizing Provider  acetaminophen-codeine (TYLENOL #4) 300-60 MG tablet Take 1  tablet by mouth 4 (four) times daily as needed for pain. 12/31/21   [provider]  Aloe-Sodium Chloride (AYR SALINE NASAL GEL NA) Place 1 Application into the nose 2 (two) times daily as needed (Dry nose).    [provider]  ALPRAZolam Prudy Feeler) 1 MG tablet Take 1 mg by mouth 3 (three) times daily as needed for anxiety. 01/02/22   [provider]  atenolol (TENORMIN) 25 MG tablet Take half tablet (12.5 mg) by mouth daily. DO NOT take if heart rate is less than 60 BPM Patient taking differently: Take 25 mg by mouth daily. DO NOT take if heart rate is less than 60 BPM 10/21/22   Jonita Albee, PA-C  bumetanide (BUMEX) 1 MG tablet Take 1 tablet (1 mg total) by mouth daily as needed. 08/15/23   Dahal, Melina Schools, MD  cyanocobalamin (,VITAMIN B-12,) 1000 MCG/ML injection Inject 1,000 mcg into the muscle every 30 (thirty) days.    [provider]  cyclobenzaprine (FLEXERIL) 5 MG tablet Take 5 mg by mouth 2 (two) times daily as needed for muscle spasms. 10/13/22   [provider]  diclofenac Sodium (VOLTAREN ARTHRITIS PAIN) 1 % GEL Apply 1 Application topically 2 (two) times daily as needed (back pain).    [provider]  ergocalciferol (VITAMIN D2) 1.25 MG (50000 UT) capsule Take 50,000 Units by mouth every 14 (fourteen) days. Every other Friday    [provider]  ezetimibe (ZETIA) 10 MG tablet Take 10 mg by  mouth at bedtime. 12/31/21   [provider]  fenofibrate micronized (LOFIBRA) 200 MG capsule Take 200 mg by mouth daily. 12/31/21   [provider]  Fluticasone-Umeclidin-Vilant (TRELEGY ELLIPTA) 200-62.5-25 MCG/ACT AEPB Inhale 1 puff into the lungs daily. 08/31/22     levalbuterol (XOPENEX HFA) 45 MCG/ACT inhaler Inhale 2 puffs into the lungs every 6 (six) hours as needed for shortness of breath or wheezing. 12/23/21   [provider]  losartan (COZAAR) 25 MG tablet Take 0.5 tablets (12.5 mg total) by mouth  daily. Patient taking differently: Take 25 mg by mouth daily. 10/21/22   Jonita Albee, PA-C  melatonin 3 MG TABS tablet Take 1 tablet (3 mg total) by mouth at bedtime. 08/15/23   DahalMelina Schools, MD  Menthol, Topical Analgesic, (ICY HOT BACK EX) Apply 1 Application topically 2 (two) times daily as needed (back pain).    [provider]  nitroGLYCERIN (NITROSTAT) 0.4 MG SL tablet Place 0.4 mg under the tongue every 5 (five) minutes x 3 doses as needed for chest pain. 12/31/21   [provider]  omeprazole (PRILOSEC) 40 MG capsule Take 40 mg by mouth daily.    [provider]  potassium chloride SA (KLOR-CON M) 20 MEQ tablet Take 20 mEq by mouth daily. 12/31/21   [provider]  pregabalin (LYRICA) 50 MG capsule Take 50 mg by mouth 2 (two) times daily.    [provider]      Allergies    Methylprednisolone, Azulfidine [sulfasalazine], Epipen [epinephrine], Flagyl [metronidazole], Motrin [ibuprofen], Nsaids, Penicillins, and Morphine and codeine    Review of Systems   Review of Systems  Unable to perform ROS: Mental status change  Constitutional:  Positive for fever.  Skin:  Negative for color change and rash.  All other systems reviewed and are negative.   Physical Exam Updated Vital Signs BP 107/68   Pulse (!) 104   Temp (!) 102.7 F (39.3 C) (Oral)   Resp 16   Ht 1.626 m (5\' 4" )   Wt 68 kg   SpO2 94%   BMI 25.73 kg/m  Physical Exam Vitals and nursing note reviewed.  Constitutional:      General: She is not in acute distress.    Appearance: Normal appearance. She is well-developed.  HENT:     Head: Normocephalic and atraumatic.  Eyes:     Extraocular Movements: Extraocular movements intact.     Conjunctiva/sclera: Conjunctivae normal.     Pupils: Pupils are equal, round, and reactive to light.  Cardiovascular:     Rate and Rhythm: Regular rhythm. Tachycardia present.     Heart sounds: No murmur heard. Pulmonary:     Effort:  Pulmonary effort is normal. No respiratory distress.     Breath sounds: Normal breath sounds.  Abdominal:     General: There is no distension.     Palpations: Abdomen is soft.     Tenderness: There is no abdominal tenderness. There is no guarding.  Musculoskeletal:        General: No swelling.     Cervical back: Neck supple.  Skin:    General: Skin is warm and dry.     Capillary Refill: Capillary refill takes less than 2 seconds.  Neurological:     General: No focal deficit present.     Mental Status: She is alert.     Comments: Talking but seems to be confused.  Moving all 4 extremities.  Psychiatric:  Mood and Affect: Mood normal.     ED Results / Procedures / Treatments   Labs (all labs ordered are listed, but only abnormal results are displayed) Labs Reviewed  RESP PANEL BY RT-PCR (RSV, FLU A&B, COVID)  RVPGX2 - Abnormal; Notable for the following components:      Result Value   SARS Coronavirus 2 by RT PCR POSITIVE (*)    All other components within normal limits  URINALYSIS, ROUTINE W REFLEX MICROSCOPIC - Abnormal; Notable for the following components:   Leukocytes,Ua SMALL (*)    Non Squamous Epithelial 0-5 (*)    All other components within normal limits  CBC WITH DIFFERENTIAL/PLATELET - Abnormal; Notable for the following components:   RBC 3.86 (*)    Hemoglobin 11.5 (*)    Neutro Abs 8.4 (*)    Lymphs Abs 0.4 (*)    All other components within normal limits  COMPREHENSIVE METABOLIC PANEL - Abnormal; Notable for the following components:   Potassium 5.2 (*)    Glucose, Bld 119 (*)    BUN 28 (*)    Creatinine, Ser 1.40 (*)    Calcium 8.8 (*)    Alkaline Phosphatase 32 (*)    GFR, Estimated 38 (*)    All other components within normal limits  URINE CULTURE  CULTURE, BLOOD (ROUTINE X 2)  CULTURE, BLOOD (ROUTINE X 2)  LIPASE, BLOOD  I-STAT CG4 LACTIC ACID, ED  I-STAT CG4 LACTIC ACID, ED    EKG None  Radiology No results  found.  Procedures Procedures    Medications Ordered in ED Medications  acetaminophen (TYLENOL) tablet 650 mg (650 mg Oral Given 09/05/23 1839)    ED Course/ Medical Decision Making/ A&P                                 Medical Decision Making Amount and/or Complexity of Data Reviewed Labs: ordered. Radiology: ordered.  Risk OTC drugs. Decision regarding hospitalization.   Patient seems to deny any abdominal pain or nausea vomiting.  The patient certainly has significant fever here by 102.7.  Will get blood cultures will get lactic acid.  Blood pressure is good currently.  Will hold off for now initiating sepsis protocol.  Patient certainly has a history of congestive heart failure.  Will see will get on respiratory panel.  Patient's urinalysis has 11-20 white blood cells no bacteria.  Will send for culture.  Patient is lactic acid is back that is reassuring at 1.6.  Will see what we get on the rest of her labs.  Patient given extra strength Tylenol for her fever.  Blood cultures pending urinalysis with white blood cells.  Urine culture pending.  Patient's CBC white count 9.4 hemoglobin 11.5 platelets 174 complete metabolic panel creatinine 1.4 for GFR 38 so a little bit of acute kidney injury dehydration potassium up a little bit at 5.2 blood sugar good.  LFTs normal.  Lipase normal.  Patient's respiratory panel positive for COVID.  Head CT and chest x-ray pending. CT and chest x-ray without any acute findings.  Will contact hospitalist for admission.   Final Clinical Impression(s) / ED Diagnoses Final diagnoses:  COVID  Fever, unspecified fever cause  Dehydration    Rx / DC Orders ED Discharge Orders     None         Vanetta Mulders, MD 09/05/23 Carlis Stable    Vanetta Mulders, MD 09/05/23 1941    Deretha Emory,  Lorin Picket, MD 09/05/23 [phone]

## 2023-09-05 NOTE — ED Triage Notes (Signed)
 Pt arrived from home, brought in by EMS. EMS states called was placed from patient medical alert. Patient has been having urinary frequency, weakness, cough that has worsened in the last few days and fever that started today. Family reported new onset confusion. Patient normally AAOx4, ambulatory. COPD hx. Normally wear 2L at home

## 2023-09-06 DIAGNOSIS — E860 Dehydration: Secondary | ICD-10-CM | POA: Diagnosis present

## 2023-09-06 DIAGNOSIS — I130 Hypertensive heart and chronic kidney disease with heart failure and stage 1 through stage 4 chronic kidney disease, or unspecified chronic kidney disease: Secondary | ICD-10-CM | POA: Diagnosis present

## 2023-09-06 DIAGNOSIS — I160 Hypertensive urgency: Secondary | ICD-10-CM | POA: Diagnosis not present

## 2023-09-06 DIAGNOSIS — I5032 Chronic diastolic (congestive) heart failure: Secondary | ICD-10-CM | POA: Diagnosis present

## 2023-09-06 DIAGNOSIS — Z950 Presence of cardiac pacemaker: Secondary | ICD-10-CM | POA: Diagnosis not present

## 2023-09-06 DIAGNOSIS — K5090 Crohn's disease, unspecified, without complications: Secondary | ICD-10-CM | POA: Diagnosis present

## 2023-09-06 DIAGNOSIS — E875 Hyperkalemia: Secondary | ICD-10-CM | POA: Diagnosis present

## 2023-09-06 DIAGNOSIS — U071 COVID-19: Principal | ICD-10-CM | POA: Diagnosis present

## 2023-09-06 DIAGNOSIS — A4189 Other specified sepsis: Secondary | ICD-10-CM | POA: Diagnosis present

## 2023-09-06 DIAGNOSIS — J9621 Acute and chronic respiratory failure with hypoxia: Secondary | ICD-10-CM | POA: Diagnosis present

## 2023-09-06 DIAGNOSIS — I2510 Atherosclerotic heart disease of native coronary artery without angina pectoris: Secondary | ICD-10-CM | POA: Diagnosis present

## 2023-09-06 DIAGNOSIS — Z79899 Other long term (current) drug therapy: Secondary | ICD-10-CM | POA: Diagnosis not present

## 2023-09-06 DIAGNOSIS — E861 Hypovolemia: Secondary | ICD-10-CM | POA: Diagnosis present

## 2023-09-06 DIAGNOSIS — Z87891 Personal history of nicotine dependence: Secondary | ICD-10-CM | POA: Diagnosis not present

## 2023-09-06 DIAGNOSIS — J441 Chronic obstructive pulmonary disease with (acute) exacerbation: Secondary | ICD-10-CM | POA: Diagnosis present

## 2023-09-06 DIAGNOSIS — E785 Hyperlipidemia, unspecified: Secondary | ICD-10-CM | POA: Diagnosis present

## 2023-09-06 DIAGNOSIS — G8929 Other chronic pain: Secondary | ICD-10-CM | POA: Diagnosis present

## 2023-09-06 DIAGNOSIS — N179 Acute kidney failure, unspecified: Secondary | ICD-10-CM | POA: Diagnosis present

## 2023-09-06 DIAGNOSIS — K219 Gastro-esophageal reflux disease without esophagitis: Secondary | ICD-10-CM | POA: Diagnosis present

## 2023-09-06 DIAGNOSIS — M549 Dorsalgia, unspecified: Secondary | ICD-10-CM | POA: Diagnosis present

## 2023-09-06 DIAGNOSIS — N1831 Chronic kidney disease, stage 3a: Secondary | ICD-10-CM | POA: Diagnosis present

## 2023-09-06 DIAGNOSIS — I495 Sick sinus syndrome: Secondary | ICD-10-CM | POA: Diagnosis present

## 2023-09-06 DIAGNOSIS — F419 Anxiety disorder, unspecified: Secondary | ICD-10-CM | POA: Diagnosis present

## 2023-09-06 DIAGNOSIS — R6520 Severe sepsis without septic shock: Secondary | ICD-10-CM | POA: Diagnosis present

## 2023-09-06 LAB — CBC
HCT: 42.7 % (ref 36.0–46.0)
Hemoglobin: 13.3 g/dL (ref 12.0–15.0)
MCH: 30.4 pg (ref 26.0–34.0)
MCHC: 31.1 g/dL (ref 30.0–36.0)
MCV: 97.5 fL (ref 80.0–100.0)
Platelets: 210 10*3/uL (ref 150–400)
RBC: 4.38 MIL/uL (ref 3.87–5.11)
RDW: 13.4 % (ref 11.5–15.5)
WBC: 10.5 10*3/uL (ref 4.0–10.5)
nRBC: 0 % (ref 0.0–0.2)

## 2023-09-06 LAB — URINE CULTURE

## 2023-09-06 LAB — BASIC METABOLIC PANEL
Anion gap: 14 (ref 5–15)
BUN: 24 mg/dL — ABNORMAL HIGH (ref 8–23)
CO2: 18 mmol/L — ABNORMAL LOW (ref 22–32)
Calcium: 8.2 mg/dL — ABNORMAL LOW (ref 8.9–10.3)
Chloride: 105 mmol/L (ref 98–111)
Creatinine, Ser: 1.19 mg/dL — ABNORMAL HIGH (ref 0.44–1.00)
GFR, Estimated: 46 mL/min — ABNORMAL LOW (ref >60)
Glucose, Bld: 157 mg/dL — ABNORMAL HIGH (ref 70–99)
Potassium: 4.7 mmol/L (ref 3.5–5.1)
Sodium: 137 mmol/L (ref 135–145)

## 2023-09-06 LAB — MAGNESIUM: Magnesium: 1.8 mg/dL (ref 1.7–2.4)

## 2023-09-06 LAB — I-STAT CG4 LACTIC ACID, ED: Lactic Acid, Venous: 2.9 mmol/L (ref 0.5–1.9)

## 2023-09-06 LAB — GLUCOSE, CAPILLARY: Glucose-Capillary: 94 mg/dL (ref 70–99)

## 2023-09-06 LAB — PHOSPHORUS: Phosphorus: 3.9 mg/dL (ref 2.5–4.6)

## 2023-09-06 MED ORDER — LEVALBUTEROL HCL 0.63 MG/3ML IN NEBU
INHALATION_SOLUTION | RESPIRATORY_TRACT | Status: AC
  Filled 2023-09-06: qty 3

## 2023-09-06 MED ORDER — CHLORHEXIDINE GLUCONATE CLOTH 2 % EX PADS
6.0000 | MEDICATED_PAD | Freq: Every day | CUTANEOUS | Status: DC
  Administered 2023-09-07 – 2023-09-11 (×5): 6 via TOPICAL

## 2023-09-06 MED ORDER — LEVALBUTEROL HCL 0.63 MG/3ML IN NEBU
0.6300 mg | INHALATION_SOLUTION | Freq: Four times a day (QID) | RESPIRATORY_TRACT | Status: AC | PRN
  Administered 2023-09-06 – 2023-09-07 (×2): 0.63 mg via RESPIRATORY_TRACT
  Filled 2023-09-06 (×2): qty 3

## 2023-09-06 MED ORDER — ENOXAPARIN SODIUM 40 MG/0.4ML IJ SOSY
40.0000 mg | PREFILLED_SYRINGE | INTRAMUSCULAR | Status: DC
  Administered 2023-09-07 – 2023-09-10 (×5): 40 mg via SUBCUTANEOUS
  Filled 2023-09-06 (×5): qty 0.4

## 2023-09-06 NOTE — ED Notes (Signed)
 ED TO INPATIENT HANDOFF REPORT  ED Nurse Name and Phone #: Jacqulyn Liner EMTP  S Name/Age/Gender Brittany Irwin 83 y.o. female Room/Bed: WA19/WA19  Code Status   Code Status: Full Code  Home/SNF/Other Home Patient oriented to: self, place, time, and situation Is this baseline? Yes   Triage Complete: Triage complete  Chief Complaint COVID-19 virus infection [U07.1] COVID [U07.1]  Triage Note Pt arrived from home, brought in by EMS. EMS states called was placed from patient medical alert. Patient has been having urinary frequency, weakness, cough that has worsened in the last few days and fever that started today. Family reported new onset confusion. Patient normally AAOx4, ambulatory. COPD hx. Normally wear 2L at home   Allergies Allergies  Allergen Reactions   Methylprednisolone Other (See Comments)    Increased BP, HR, agitation, hallucinations    Ativan [Lorazepam]     Psychosis   Azulfidine [Sulfasalazine] Other (See Comments)    Headache    Epipen [Epinephrine] Hypertension   Flagyl [Metronidazole] Hives   Motrin [Ibuprofen] Nausea Only and Other (See Comments)    Told not to take medication due to GI distress caused by crohn's disease.   Nsaids Other (See Comments)    Told not to take NSAIDs due to GI distress caused by crohn's disease   Penicillins Hives   Morphine And Codeine Itching    Told not to take due to GI distress caused by crohn's disease    Level of Care/Admitting Diagnosis ED Disposition     ED Disposition  Admit   Condition  --   Comment  Hospital Area: [facility] [[id number]]  Level of Care: Stepdown [14]  Admit to SDU based on following criteria: Respiratory Distress:  Frequent assessment and/or intervention to maintain adequate ventilation/respiration, pulmonary toilet, and respiratory treatment.  May admit patient to [facility] or [facility] if equivalent level of care is available:: No  Covid Evaluation: Confirmed  COVID Positive  Diagnosis: COVID [[id number]]  Admitting Physician: Steffanie Rainwater [[id number]]  Attending Physician: Joycelyn Das [[id number]]  Certification:: I certify this patient will need inpatient services for at least 2 midnights  Expected Medical Readiness: 09/08/2023          B Medical/Surgery History Past Medical History:  Diagnosis Date   Acute CHF (congestive heart failure) (HCC) 01/02/2022   AKI (acute kidney injury) (HCC) 09/08/2020   Aspiration pneumonia (HCC) 01/05/2019   Atypical chest pain 03/11/2022   Bradycardia 03/14/2020   C. difficile colitis 04/03/2021   CHF (congestive heart failure) (HCC)    Coronary artery disease    Tachycardia 05/31/2013   Past Surgical History:  Procedure Laterality Date   ABDOMINAL HYSTERECTOMY     BOWEL RESECTION     CHOLECYSTECTOMY       A IV Location/Drains/Wounds Patient Lines/Drains/Airways Status     Active Line/Drains/Airways     Name Placement date Placement time Site Days   Peripheral IV 09/05/23 20 G Anterior;Left;Proximal Forearm 09/05/23  1903  Forearm  1   Peripheral IV 09/05/23 Right;Posterior Hand 09/05/23  1904  Hand  1   Peripheral IV 09/06/23 22 G Left;Posterior Hand 09/06/23  1744  Hand  less than 1            Intake/Output Last 24 hours  Intake/Output Summary (Last 24 hours) at 09/06/2023 1918 Last data filed at 09/06/2023 0134 Gross per 24 hour  Intake 1291.08 ml  Output --  Net 1291.08 ml    Labs/Imaging Results for  orders placed or performed during the hospital encounter of 09/05/23 (from the past 48 hours)  Resp panel by RT-PCR (RSV, Flu A&B, Covid) Urine, Clean Catch     Status: Abnormal   Collection Time: 09/05/23  5:54 PM   Specimen: Urine, Clean Catch; Nasal Swab  Result Value Ref Range   SARS Coronavirus 2 by RT PCR POSITIVE (A) NEGATIVE    Comment: (NOTE) SARS-CoV-2 target nucleic acids are DETECTED.  The SARS-CoV-2 RNA is generally detectable in upper  respiratory specimens during the acute phase of infection. Positive results are indicative of the presence of the identified virus, but do not rule out bacterial infection or co-infection with other pathogens not detected by the test. Clinical correlation with patient history and other diagnostic information is necessary to determine patient infection status. The expected result is Negative.  Fact Sheet for Patients: BloggerCourse.com  Fact Sheet for Healthcare Providers: SeriousBroker.it  This test is not yet approved or cleared by the [place] FDA and  has been authorized for detection and/or diagnosis of SARS-CoV-2 by FDA under an Emergency Use Authorization (EUA).  This EUA will remain in effect (meaning this test can be used) for the duration of  the COVID-19 declaration under Section 564(b)(1) of the A ct, 21 U.S.C. section 360bbb-3(b)(1), unless the authorization is terminated or revoked sooner.     Influenza A by PCR NEGATIVE NEGATIVE   Influenza B by PCR NEGATIVE NEGATIVE    Comment: (NOTE) The Xpert Xpress SARS-CoV-2/FLU/RSV plus assay is intended as an aid in the diagnosis of influenza from Nasopharyngeal swab specimens and should not be used as a sole basis for treatment. Nasal washings and aspirates are unacceptable for Xpert Xpress SARS-CoV-2/FLU/RSV testing.  Fact Sheet for Patients: BloggerCourse.com  Fact Sheet for Healthcare Providers: SeriousBroker.it  This test is not yet approved or cleared by the [place] FDA and has been authorized for detection and/or diagnosis of SARS-CoV-2 by FDA under an Emergency Use Authorization (EUA). This EUA will remain in effect (meaning this test can be used) for the duration of the COVID-19 declaration under Section 564(b)(1) of the Act, 21 U.S.C. section 360bbb-3(b)(1), unless the authorization is terminated  or revoked.     Resp Syncytial Virus by PCR NEGATIVE NEGATIVE    Comment: (NOTE) Fact Sheet for Patients: BloggerCourse.com  Fact Sheet for Healthcare Providers: SeriousBroker.it  This test is not yet approved or cleared by the [place] FDA and has been authorized for detection and/or diagnosis of SARS-CoV-2 by FDA under an Emergency Use Authorization (EUA). This EUA will remain in effect (meaning this test can be used) for the duration of the COVID-19 declaration under Section 564(b)(1) of the Act, 21 U.S.C. section 360bbb-3(b)(1), unless the authorization is terminated or revoked.  Performed at [facility], [address], [place], [place] [address]   CBC with Differential/Platelet     Status: Abnormal   Collection Time: 09/05/23  5:55 PM  Result Value Ref Range   WBC 9.5 4.0 - 10.5 K/uL   RBC 3.86 (L) 3.87 - 5.11 MIL/uL   Hemoglobin 11.5 (L) 12.0 - 15.0 g/dL   HCT 24.4 01.0 - 27.2 %   MCV 94.8 80.0 - 100.0 fL   MCH 29.8 26.0 - 34.0 pg   MCHC 31.4 30.0 - 36.0 g/dL   RDW 53.6 64.4 - 03.4 %   Platelets 174 150 - 400 K/uL   nRBC 0.0 0.0 - 0.2 %   Neutrophils Relative % 89 %  Neutro Abs 8.4 (H) 1.7 - 7.7 K/uL   Lymphocytes Relative 4 %   Lymphs Abs 0.4 (L) 0.7 - 4.0 K/uL   Monocytes Relative 5 %   Monocytes Absolute 0.5 0.1 - 1.0 K/uL   Eosinophils Relative 1 %   Eosinophils Absolute 0.1 0.0 - 0.5 K/uL   Basophils Relative 1 %   Basophils Absolute 0.1 0.0 - 0.1 K/uL   Immature Granulocytes 0 %   Abs Immature Granulocytes 0.04 0.00 - 0.07 K/uL    Comment: Performed at [facility], [address] [place], [place] [address]  Comprehensive metabolic panel     Status: Abnormal   Collection Time: 09/05/23  5:55 PM  Result Value Ref Range   Sodium 136 135 - 145 mmol/L   Potassium 5.2 (H) 3.5 - 5.1 mmol/L   Chloride 102 98 - 111 mmol/L   CO2 25 22 - 32 mmol/L   Glucose, Bld  119 (H) 70 - 99 mg/dL    Comment: Glucose reference range applies only to samples taken after fasting for at least 8 hours.   BUN 28 (H) 8 - 23 mg/dL   Creatinine, Ser 6.04 (H) 0.44 - 1.00 mg/dL   Calcium 8.8 (L) 8.9 - 10.3 mg/dL   Total Protein 7.1 6.5 - 8.1 g/dL   Albumin 3.8 3.5 - 5.0 g/dL   AST 33 15 - 41 U/L   ALT 11 0 - 44 U/L   Alkaline Phosphatase 32 (L) 38 - 126 U/L   Total Bilirubin 0.6 0.0 - 1.2 mg/dL   GFR, Estimated 38 (L) >60 mL/min    Comment: (NOTE) Calculated using the CKD-EPI Creatinine Equation (2021)    Anion gap 9 5 - 15    Comment: Performed at [facility], [address], [place], [place] [address]  Lipase, blood     Status: None   Collection Time: 09/05/23  5:55 PM  Result Value Ref Range   Lipase 32 11 - 51 U/L    Comment: Performed at [facility], [address], [place], [place] [address]  Urine Culture     Status: Abnormal   Collection Time: 09/05/23  5:57 PM   Specimen: Urine, Clean Catch  Result Value Ref Range   Specimen Description      URINE, CLEAN CATCH Performed at [facility], [address], [place], [place] [address]    Special Requests      NONE Performed at [facility], [address] [place], [place] [address]    Culture MULTIPLE SPECIES PRESENT, SUGGEST RECOLLECTION (A)    Report Status 09/06/2023 FINAL   Urinalysis, Routine w reflex microscopic -Urine, Clean Catch     Status: Abnormal   Collection Time: 09/05/23  5:57 PM  Result Value Ref Range   Color, Urine YELLOW YELLOW   APPearance CLEAR CLEAR   Specific Gravity, Urine 1.018 1.005 - 1.030   pH 7.0 5.0 - 8.0   Glucose, UA NEGATIVE NEGATIVE mg/dL   Hgb urine dipstick NEGATIVE NEGATIVE   Bilirubin Urine NEGATIVE NEGATIVE   Ketones, ur NEGATIVE NEGATIVE mg/dL   Protein, ur NEGATIVE NEGATIVE mg/dL   Nitrite NEGATIVE NEGATIVE   Leukocytes,Ua SMALL (A) NEGATIVE   RBC / HPF 0-5 0 - 5 RBC/hpf   WBC,  UA 11-20 0 - 5 WBC/hpf   Bacteria, UA NONE SEEN NONE SEEN   Squamous Epithelial / HPF 0-5 0 - 5 /HPF   Non Squamous Epithelial  0-5 (A) NONE SEEN    Comment: Performed at [facility], [address], [place], [place] [address]  I-Stat CG4 Lactic Acid     Status: None   Collection Time: 09/05/23  6:00 PM  Result Value Ref Range   Lactic Acid, Venous 1.6 0.5 - 1.9 mmol/L  Culture, blood (Routine X 2) w Reflex to ID Panel     Status: None (Preliminary result)   Collection Time: 09/05/23  6:05 PM   Specimen: BLOOD  Result Value Ref Range   Specimen Description      BLOOD LEFT ANTECUBITAL Performed at [facility] [address], [place], [place] [address]    Special Requests      BOTTLES DRAWN AEROBIC AND ANAEROBIC Blood Culture results may not be optimal due to an inadequate volume of blood received in culture bottles Performed at [facility], [address] [place], [place] [address]    Culture      NO GROWTH < 12 HOURS Performed at [facility], [address] [place], [place] [address]    Report Status PENDING   Culture, blood (Routine X 2) w Reflex to ID Panel     Status: None (Preliminary result)   Collection Time: 09/05/23  6:42 PM   Specimen: BLOOD  Result Value Ref Range   Specimen Description      BLOOD RIGHT ANTECUBITAL Performed at [facility], [address] [place], [place] [address]    Special Requests      BOTTLES DRAWN AEROBIC AND ANAEROBIC Blood Culture results may not be optimal due to an inadequate volume of blood received in culture bottles Performed at [facility], [address], [place], [place] [address]    Culture      NO GROWTH < 12 HOURS Performed at [facility], [address] [place], [place] [address]    Report Status PENDING   Basic metabolic panel     Status: Abnormal   Collection Time: 09/06/23  6:16 AM  Result Value Ref Range    Sodium 137 135 - 145 mmol/L   Potassium 4.7 3.5 - 5.1 mmol/L   Chloride 105 98 - 111 mmol/L   CO2 18 (L) 22 - 32 mmol/L   Glucose, Bld 157 (H) 70 - 99 mg/dL    Comment: Glucose reference range applies only to samples taken after fasting for at least 8 hours.   BUN 24 (H) 8 - 23 mg/dL   Creatinine, Ser 9.62 (H) 0.44 - 1.00 mg/dL   Calcium 8.2 (L) 8.9 - 10.3 mg/dL   GFR, Estimated 46 (L) >60 mL/min    Comment: (NOTE) Calculated using the CKD-EPI Creatinine Equation (2021)    Anion gap 14 5 - 15    Comment: Performed at [facility], [address] [place], [place] [address]  CBC     Status: None   Collection Time: 09/06/23  6:16 AM  Result Value Ref Range   WBC 10.5 4.0 - 10.5 K/uL   RBC 4.38 3.87 - 5.11 MIL/uL   Hemoglobin 13.3 12.0 - 15.0 g/dL   HCT 13.2 44.0 - 10.2 %   MCV 97.5 80.0 - 100.0 fL   MCH 30.4 26.0 - 34.0 pg   MCHC 31.1 30.0 - 36.0 g/dL   RDW 72.5 36.6 - 44.0 %   Platelets 210 150 - 400 K/uL   nRBC 0.0 0.0 - 0.2 %  Comment: Performed at [facility], [address], [place], [place] [address]  Magnesium     Status: None   Collection Time: 09/06/23  6:16 AM  Result Value Ref Range   Magnesium 1.8 1.7 - 2.4 mg/dL    Comment: Performed at [facility], [address], [place], [place] [address]  Phosphorus     Status: None   Collection Time: 09/06/23  6:16 AM  Result Value Ref Range   Phosphorus 3.9 2.5 - 4.6 mg/dL    Comment: Performed at [facility], [address], [place], [place] [address]  I-Stat CG4 Lactic Acid     Status: Abnormal   Collection Time: 09/06/23  6:26 AM  Result Value Ref Range   Lactic Acid, Venous 2.9 (HH) 0.5 - 1.9 mmol/L   Comment NOTIFIED PHYSICIAN    DG Chest Port 1 View Result Date: 09/05/2023 CLINICAL DATA:  fever EXAM: PORTABLE CHEST 1 VIEW COMPARISON:  August 11, 2023 FINDINGS: Evaluation is limited by rotation. The cardiomediastinal silhouette is  unchanged and enlarged in contour with similar prominence of the RIGHT pericardial border.LEFT chest cardiac pacing device. No pleural effusion. No pneumothorax. No acute pleuroparenchymal abnormality. IMPRESSION: No acute cardiopulmonary abnormality. Electronically Signed   By: Meda Klinefelter M.D.   On: 09/05/2023 19:44   CT Head Wo Contrast Result Date: 09/05/2023 CLINICAL DATA:  Mental status change, unknown cause EXAM: CT HEAD WITHOUT CONTRAST TECHNIQUE: Contiguous axial images were obtained from the base of the skull through the vertex without intravenous contrast. RADIATION DOSE REDUCTION: This exam was performed according to the departmental dose-optimization program which includes automated exposure control, adjustment of the mA and/or kV according to patient size and/or use of iterative reconstruction technique. COMPARISON:  None Available. FINDINGS: Brain: No evidence of acute infarction, hemorrhage, hydrocephalus, extra-axial collection or mass lesion/mass effect. Vascular: No hyperdense vessel. Skull: No acute fracture. Sinuses/Orbits: Clear sinuses.  No acute orbital findings. Other: No mastoid effusions. IMPRESSION: No evidence of acute intracranial abnormality. Electronically Signed   By: Feliberto Harts M.D.   On: 09/05/2023 19:40    Pending Labs Unresulted Labs (From admission, onward)     Start     Ordered   09/07/23 0500  CBC  Tomorrow morning,   R        09/06/23 1349   09/07/23 0500  Basic metabolic panel  Tomorrow morning,   R        09/06/23 1349   09/07/23 0500  Magnesium  Tomorrow morning,   R        09/06/23 1349            Vitals/Pain Today's Vitals   09/06/23 1720 09/06/23 1730 09/06/23 1815 09/06/23 1918  BP:  (!) 117/50 124/64   Pulse: 76 74 72   Resp:  17 17   Temp: 98.5 F (36.9 C)     TempSrc: Oral     SpO2: 93% 95% 93%   Weight:      Height:      PainSc:    4     Isolation Precautions Airborne and Contact  precautions  Medications Medications  acetaminophen (TYLENOL) tablet 650 mg (650 mg Oral Given 09/06/23 1721)    Or  acetaminophen (TYLENOL) suppository 650 mg ( Rectal See Alternative 09/06/23 1721)  senna-docusate (Senokot-S) tablet 1 tablet (has no administration in time range)  ondansetron (ZOFRAN) tablet 4 mg (has no administration in time range)    Or  ondansetron (  ZOFRAN) injection 4 mg (has no administration in time range)  ALPRAZolam (XANAX) tablet 1 mg (1 mg Oral Given 09/06/23 1721)  cyclobenzaprine (FLEXERIL) tablet 5 mg (5 mg Oral Given by Other 09/05/23 2345)  diclofenac Sodium (VOLTAREN) 1 % topical gel 2 g (has no administration in time range)  ezetimibe (ZETIA) tablet 10 mg (10 mg Oral Given by Other 09/06/23 0015)  fenofibrate tablet 160 mg (160 mg Oral Given 09/06/23 1721)  fluticasone furoate-vilanterol (BREO ELLIPTA) 200-25 MCG/ACT 1 puff (1 puff Inhalation Not Given 09/06/23 1154)    And  umeclidinium bromide (INCRUSE ELLIPTA) 62.5 MCG/ACT 1 puff (1 puff Inhalation Not Given 09/06/23 1156)  melatonin tablet 3 mg (has no administration in time range)  pantoprazole (PROTONIX) EC tablet 40 mg (40 mg Oral Given 09/06/23 1330)  potassium chloride SA (KLOR-CON M) CR tablet 20 mEq (20 mEq Oral Given 09/06/23 1330)  pregabalin (LYRICA) capsule 50 mg (50 mg Oral Given 09/06/23 1330)  remdesivir 200 mg in sodium chloride 0.9% 250 mL IVPB (0 mg Intravenous Stopped 09/06/23 0019)    Followed by  remdesivir 100 mg in sodium chloride 0.9 % 100 mL IVPB (100 mg Intravenous New Bag/Given 09/06/23 1733)  levalbuterol (XOPENEX) nebulizer solution 0.63 mg (0.63 mg Nebulization Given 09/06/23 0551)  levalbuterol (XOPENEX) 0.63 MG/3ML nebulizer solution (  Canceled Entry 09/06/23 0552)  enoxaparin (LOVENOX) injection 40 mg (has no administration in time range)  acetaminophen (TYLENOL) tablet 650 mg (650 mg Oral Given 09/05/23 1839)  sodium chloride 0.9 % bolus 500 mL (0 mLs Intravenous Stopped 09/06/23  0017)  sodium chloride 0.9 % bolus 500 mL (0 mLs Intravenous Stopped 09/06/23 0134)    Mobility walks     Focused Assessments Pulmonary Assessment Handoff:  Lung sounds: Bilateral Breath Sounds: Diminished, Clear O2 Device: Nasal Cannula O2 Flow Rate (L/min): 4 L/min    R Recommendations: See Admitting Provider Note  Report given to:   Additional Notes:

## 2023-09-06 NOTE — ED Notes (Signed)
 Pt found by tech standing at the end of her bed. Pt IV was no longer in place with dried blood on her arm. Pt was altered unable to advised what happened. Pt had cyanosis around her mouth, fingers and toes. Pt placed on Non-rebreather while waiting for provider. Provider advised on new orders.

## 2023-09-06 NOTE — Progress Notes (Signed)
 Pt resting comfortably, bipap not indicated at this time.

## 2023-09-06 NOTE — Progress Notes (Signed)
 Removed PT from BiPAP and placed on 4 LPM nasal cannula (3-4 LPM Dep at home). PT tolerating well, vitals stable, no respiratory distress noted at this time. BiPAP remains in room and order has been changed to PRN). RN aware.

## 2023-09-06 NOTE — Progress Notes (Addendum)
 Attempted to remove PT from BiPAP- PT was too SOB (placed back on BiPAP)- RN aware. Mepilex applied across bridge of nose (skin slightly red but intact before application).

## 2023-09-06 NOTE — Hospital Course (Addendum)
 82 yof w/ HTN, chronic diastolic CHF, s/p PPM, COPD, chronic respiratory failure with hypoxia on 3 L [place],CKD 3A,  HX of SBO, Crohn's disease, chronic back pain, anxiety, and anemia presented with altered mental status generalized weakness cough and fever.  Recently admit 1/22-1/26 for SBO w/ acute metabolic encephalopathy improved with conservative management. In the ED: Fever up to 102.7, tachycardic initial oxygen 94%.Labs-positive COVID,Normal lactate and normal white blood cell count.UA with 11-20 white cells.CXR-without any infiltrate.CT head without any intracranial abnormality.  Patient subsequent PRN nonrebreather and BIPAP and admitted to stepdown unit.  Initially needing BiPAP and nonrebreather, managed with remdesivir aggressive bronchodilators. Overall clinically improving wean down to HFNC.  Patient reports using 4 L nasal cannula mostly at home.  Overall patient is clinically improving.  Doing well with PT OT and ambulation.  Transferred out of SDU 2/20. Patient slowly continues to improve does have deconditioning debility, seen by PT OT and no further PT OT needs.  Oxygen need at baseline home setting of 4 L and hemodynamically stable She feels well and ready for discharge to home today.

## 2023-09-06 NOTE — Progress Notes (Signed)
 PROGRESS NOTE  Brittany Irwin ZOX:[id number] DOB: [birth year] DOA: 09/05/2023 PCP: Teodoro Spray, MD   LOS: 0 days   Brief narrative:  Brittany Irwin is a 83 y.o. female with medical history significant for hypertension, chronic diastolic CHF, s/p PPM, COPD, chronic respiratory failure with hypoxia on 3 L nasal cannula at baseline, CKD 3A, small bowel obstruction, Crohn's disease, chronic back pain, anxiety, and anemia.  With altered mental status, generalized weakness cough and fever.  Reported dark sinus congestion and dry cough since being discharged from: Hospital 3 weeks ago.  EMS was called in and was noted to be slightly confused.  In the ED patient was febrile with a temperature of 102.7 F.  Was tachycardic at 1004.  Pulse ox was 94% on room air.  Lab work significant for positive COVID test.  Normal lactate and normal white blood cell count.  Urine analysis with 11-20 white cells.  Chest x-ray without any infiltrate.  CT head without any intracranial abnormality.  Assessment/Plan: Principal Problem:   COVID-19 virus infection Active Problems:   Sepsis without acute organ dysfunction (HCC)   Acute on chronic hypoxic respiratory failure (HCC)   Dehydration   Hypotension due to hypovolemia   COVID   Sepsis secondary to COVID-19 infection Patient presented with fever, hypotension, tachycardia with pulmonary infiltrates positive for COVID suggestive of sepsis on presentation with increased oxygen demand to 4 L from 3 L at baseline.  Patient had increased work of breathing so was transition to BiPAP for few hours.  CXR without any infiltrate.  Has been started on remdesivir. History of agitation and hallucinations with steroids in the past but willing to try dose if necessary.  Currently we will hold off.  Continue incentive spirometry, flutter valve.  AKI on CKD 3a Baseline creatinine around 1.0-1.2. Creatinine slightly elevated to 1.4 on admission.  Has improved to 1.1 again.   Received IV fluids during hospitalization.   Hypotension Has improved at this time.  Latest blood pressure of 107/62.   Acute on chronic respiratory failure with hypoxia COPD with exacerbation.  on baseline 3 L [place] but had increased O2 requirement to 4 L [place] on admission.  Requiring BiPAP for increased work of breathing, will wean off as able.  Continue nebulizers Breo, incentive spirometer, flutter valve.    HFpEF TTE last year showed EF 60 to 65% with G1DD.  Takes Bumex at home.  Received IV fluid here.  Continue intake and output charting Daily weights.  Consider restarting diuretic by a.m.   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  Continue to monitor   Crohn's disease No acute issues. -Outpatient follow-up with gastroenterology   Anxiety -Continue as needed Xanax. Melatonin as needed for sleep   Chronic back pain -Continue Voltaren gel and as needed Flexeril    Neuropathy -Continue Lyrica  Hyperlipidemia -Continue fenofibrate and Zetia   GERD Continue PPI   DVT prophylaxis: enoxaparin (LOVENOX) injection 40 mg Start: 09/06/23 2200   Disposition: Uncertain at this time.  Pending clinical improvement.  Will get PT OT evaluation when stable.  Status is: Inpatient Remains inpatient appropriate because: Respiratory distress, requiring BiPAP, pending clinical improvement,    Code Status:     Code Status: Full Code  Family Communication: Spoke with the patient's sister at bedside  Consultants: None  Procedures: BiPAP  Anti-infectives:  Remdesivir  Anti-infectives (From admission, onward)    Start     Dose/Rate Route Frequency Ordered Stop  09/06/23 1000  remdesivir 100 mg in sodium chloride 0.9 % 100 mL IVPB       Placed in "Followed by" Linked Group   100 mg 200 mL/hr over 30 Minutes Intravenous Daily 09/05/23 2308 09/08/23 0959   09/05/23 2330  remdesivir 200 mg in sodium chloride 0.9% 250 mL IVPB       Placed in  "Followed by" Linked Group   200 mg 580 mL/hr over 30 Minutes Intravenous Once 09/05/23 2308 09/06/23 0019        Subjective: Today, patient was seen and examined at bedside.  On BiPAP.  Wishes to take it off for drink.  Complains of shortness of breath cough and congestion.  Nurse had reported increased work of breathing show patient bedside on BiPAP.  Objective: Vitals:   09/06/23 1047 09/06/23 1326  BP: 92/60 (!) 107/51  Pulse:    Resp: 15   Temp: 98 F (36.7 C)   SpO2:      Intake/Output Summary (Last 24 hours) at 09/06/2023 1345 Last data filed at 09/06/2023 0134 Gross per 24 hour  Intake 1291.08 ml  Output --  Net 1291.08 ml   Filed Weights   09/05/23 1817  Weight: 68 kg   Body mass index is 25.73 kg/m.   Physical Exam: GENERAL: Patient is alert awake and oriented.  In mild to moderate respiratory distress on BiPAP.  Elderly female. HENT: No scleral pallor or icterus. Pupils equally reactive to light. Oral mucosa is moist NECK: is supple, no gross swelling noted. CHEST:  Diminished breath sounds bilaterally.  Coarse breath sounds noted. CVS: S1 and S2 heard, no murmur. Regular rate and rhythm.  ABDOMEN: Soft, non-tender, bowel sounds are present. EXTREMITIES: No edema. CNS: Cranial nerves are intact. No focal motor deficits.  Follows commands. SKIN: warm and dry without rashes.  Data Review: I have personally reviewed the following laboratory data and studies,  CBC: Recent Labs  Lab 09/05/23 1755 09/06/23 0616  WBC 9.5 10.5  NEUTROABS 8.4*  --   HGB 11.5* 13.3  HCT 36.6 42.7  MCV 94.8 97.5  PLT 174 210   Basic Metabolic Panel: Recent Labs  Lab 09/05/23 1755 09/06/23 0616  NA 136 137  K 5.2* 4.7  CL 102 105  CO2 25 18*  GLUCOSE 119* 157*  BUN 28* 24*  CREATININE 1.40* 1.19*  CALCIUM 8.8* 8.2*  MG  --  1.8  PHOS  --  3.9   Liver Function Tests: Recent Labs  Lab 09/05/23 1755  AST 33  ALT 11  ALKPHOS 32*  BILITOT 0.6  PROT 7.1   ALBUMIN 3.8   Recent Labs  Lab 09/05/23 1755  LIPASE 32   No results for input(s): "AMMONIA" in the last 168 hours. Cardiac Enzymes: No results for input(s): "CKTOTAL", "CKMB", "CKMBINDEX", "TROPONINI" in the last 168 hours. BNP (last 3 results) Recent Labs    10/14/22 0900  BNP 321.6*    ProBNP (last 3 results) No results for input(s): "PROBNP" in the last 8760 hours.  CBG: No results for input(s): "GLUCAP" in the last 168 hours. Recent Results (from the past 240 hours)  Resp panel by RT-PCR (RSV, Flu A&B, Covid) Urine, Clean Catch     Status: Abnormal   Collection Time: 09/05/23  5:54 PM   Specimen: Urine, Clean Catch; Nasal Swab  Result Value Ref Range Status   SARS Coronavirus 2 by RT PCR POSITIVE (A) NEGATIVE Final    Comment: (NOTE) SARS-CoV-2 target nucleic acids are  DETECTED.  The SARS-CoV-2 RNA is generally detectable in upper respiratory specimens during the acute phase of infection. Positive results are indicative of the presence of the identified virus, but do not rule out bacterial infection or co-infection with other pathogens not detected by the test. Clinical correlation with patient history and other diagnostic information is necessary to determine patient infection status. The expected result is Negative.  Fact Sheet for Patients: BloggerCourse.com  Fact Sheet for Healthcare Providers: SeriousBroker.it  This test is not yet approved or cleared by the [place] FDA and  has been authorized for detection and/or diagnosis of SARS-CoV-2 by FDA under an Emergency Use Authorization (EUA).  This EUA will remain in effect (meaning this test can be used) for the duration of  the COVID-19 declaration under Section 564(b)(1) of the A ct, 21 U.S.C. section 360bbb-3(b)(1), unless the authorization is terminated or revoked sooner.     Influenza A by PCR NEGATIVE NEGATIVE Final   Influenza B by PCR  NEGATIVE NEGATIVE Final    Comment: (NOTE) The Xpert Xpress SARS-CoV-2/FLU/RSV plus assay is intended as an aid in the diagnosis of influenza from Nasopharyngeal swab specimens and should not be used as a sole basis for treatment. Nasal washings and aspirates are unacceptable for Xpert Xpress SARS-CoV-2/FLU/RSV testing.  Fact Sheet for Patients: BloggerCourse.com  Fact Sheet for Healthcare Providers: SeriousBroker.it  This test is not yet approved or cleared by the [place] FDA and has been authorized for detection and/or diagnosis of SARS-CoV-2 by FDA under an Emergency Use Authorization (EUA). This EUA will remain in effect (meaning this test can be used) for the duration of the COVID-19 declaration under Section 564(b)(1) of the Act, 21 U.S.C. section 360bbb-3(b)(1), unless the authorization is terminated or revoked.     Resp Syncytial Virus by PCR NEGATIVE NEGATIVE Final    Comment: (NOTE) Fact Sheet for Patients: BloggerCourse.com  Fact Sheet for Healthcare Providers: SeriousBroker.it  This test is not yet approved or cleared by the [place] FDA and has been authorized for detection and/or diagnosis of SARS-CoV-2 by FDA under an Emergency Use Authorization (EUA). This EUA will remain in effect (meaning this test can be used) for the duration of the COVID-19 declaration under Section 564(b)(1) of the Act, 21 U.S.C. section 360bbb-3(b)(1), unless the authorization is terminated or revoked.  Performed at [facility], [address], [place], [place] [address]   Culture, blood (Routine X 2) w Reflex to ID Panel     Status: None (Preliminary result)   Collection Time: 09/05/23  6:05 PM   Specimen: BLOOD  Result Value Ref Range Status   Specimen Description   Final    BLOOD LEFT ANTECUBITAL Performed at [facility], [address], [place], [place] [address]    Special Requests   Final    BOTTLES DRAWN AEROBIC AND ANAEROBIC Blood Culture results may not be optimal due to an inadequate volume of blood received in culture bottles Performed at [facility], [address] [place], [place] [address]    Culture   Final    NO GROWTH < 12 HOURS Performed at [facility], [address] [place], [place] [address]    Report Status PENDING  Incomplete  Culture, blood (Routine X 2) w Reflex to ID Panel     Status: None (Preliminary result)   Collection Time: 09/05/23  6:42 PM   Specimen: BLOOD  Result Value Ref Range Status  Specimen Description   Final    BLOOD RIGHT ANTECUBITAL Performed at [facility], [address] [place], [place] [address]    Special Requests   Final    BOTTLES DRAWN AEROBIC AND ANAEROBIC Blood Culture results may not be optimal due to an inadequate volume of blood received in culture bottles Performed at [facility], [address], [place], [place] [address]    Culture   Final    NO GROWTH < 12 HOURS Performed at [facility], [address] [place], [place] [address]    Report Status PENDING  Incomplete     Studies: DG Chest Port 1 View Result Date: 09/05/2023 CLINICAL DATA:  fever EXAM: PORTABLE CHEST 1 VIEW COMPARISON:  August 11, 2023 FINDINGS: Evaluation is limited by rotation. The cardiomediastinal silhouette is unchanged and enlarged in contour with similar prominence of the RIGHT pericardial border.LEFT chest cardiac pacing device. No pleural effusion. No pneumothorax. No acute pleuroparenchymal abnormality. IMPRESSION: No acute cardiopulmonary abnormality. Electronically Signed   By: Meda Klinefelter M.D.   On: 09/05/2023 19:44   CT Head Wo Contrast Result Date: 09/05/2023 CLINICAL DATA:  Mental status change, unknown cause EXAM: CT HEAD WITHOUT CONTRAST TECHNIQUE: Contiguous axial images were  obtained from the base of the skull through the vertex without intravenous contrast. RADIATION DOSE REDUCTION: This exam was performed according to the departmental dose-optimization program which includes automated exposure control, adjustment of the mA and/or kV according to patient size and/or use of iterative reconstruction technique. COMPARISON:  None Available. FINDINGS: Brain: No evidence of acute infarction, hemorrhage, hydrocephalus, extra-axial collection or mass lesion/mass effect. Vascular: No hyperdense vessel. Skull: No acute fracture. Sinuses/Orbits: Clear sinuses.  No acute orbital findings. Other: No mastoid effusions. IMPRESSION: No evidence of acute intracranial abnormality. Electronically Signed   By: Feliberto Harts M.D.   On: 09/05/2023 19:40      Joycelyn Das, MD  [facility] 09/06/2023  If 7PM-7AM, please contact night-coverage

## 2023-09-06 NOTE — ED Notes (Signed)
 Pt is asleep and comfortable after being restless most of the morning. Did not wake pt for meds at this time.

## 2023-09-07 ENCOUNTER — Encounter (HOSPITAL_COMMUNITY): Payer: Self-pay | Admitting: Student

## 2023-09-07 DIAGNOSIS — U071 COVID-19: Secondary | ICD-10-CM | POA: Diagnosis not present

## 2023-09-07 LAB — BLOOD GAS, VENOUS
Acid-base deficit: 2.2 mmol/L — ABNORMAL HIGH (ref 0.0–2.0)
Bicarbonate: 24.2 mmol/L (ref 20.0–28.0)
O2 Saturation: 47.6 %
Patient temperature: 37
pCO2, Ven: 47 mm[Hg] (ref 44–60)
pH, Ven: 7.32 (ref 7.25–7.43)
pO2, Ven: <31 mm[Hg] — CL (ref 32–45)

## 2023-09-07 LAB — CBC
HCT: 35.3 % — ABNORMAL LOW (ref 36.0–46.0)
Hemoglobin: 10.9 g/dL — ABNORMAL LOW (ref 12.0–15.0)
MCH: 30 pg (ref 26.0–34.0)
MCHC: 30.9 g/dL (ref 30.0–36.0)
MCV: 97.2 fL (ref 80.0–100.0)
Platelets: 143 10*3/uL — ABNORMAL LOW (ref 150–400)
RBC: 3.63 MIL/uL — ABNORMAL LOW (ref 3.87–5.11)
RDW: 13.5 % (ref 11.5–15.5)
WBC: 4.4 10*3/uL (ref 4.0–10.5)
nRBC: 0 % (ref 0.0–0.2)

## 2023-09-07 LAB — BASIC METABOLIC PANEL
Anion gap: 12 (ref 5–15)
BUN: 23 mg/dL (ref 8–23)
CO2: 21 mmol/L — ABNORMAL LOW (ref 22–32)
Calcium: 8.8 mg/dL — ABNORMAL LOW (ref 8.9–10.3)
Chloride: 108 mmol/L (ref 98–111)
Creatinine, Ser: 0.94 mg/dL (ref 0.44–1.00)
GFR, Estimated: >60 mL/min (ref >60)
Glucose, Bld: 100 mg/dL — ABNORMAL HIGH (ref 70–99)
Potassium: 4.3 mmol/L (ref 3.5–5.1)
Sodium: 141 mmol/L (ref 135–145)

## 2023-09-07 LAB — MAGNESIUM: Magnesium: 2.1 mg/dL (ref 1.7–2.4)

## 2023-09-07 MED ORDER — HYDRALAZINE HCL 20 MG/ML IJ SOLN: 5.0000 mg | INTRAMUSCULAR | Status: DC | PRN

## 2023-09-07 MED ORDER — ARFORMOTEROL TARTRATE 15 MCG/2ML IN NEBU
15.0000 ug | INHALATION_SOLUTION | Freq: Two times a day (BID) | RESPIRATORY_TRACT | Status: DC
  Administered 2023-09-07 – 2023-09-11 (×8): 15 ug via RESPIRATORY_TRACT
  Filled 2023-09-07 (×8): qty 2

## 2023-09-07 MED ORDER — LOPERAMIDE HCL 2 MG PO CAPS
2.0000 mg | ORAL_CAPSULE | ORAL | Status: DC | PRN
  Administered 2023-09-09: 2 mg via ORAL
  Filled 2023-09-07: qty 1

## 2023-09-07 MED ORDER — ACETAMINOPHEN-CODEINE 300-60 MG PO TABS: 1.0000 | ORAL_TABLET | Freq: Four times a day (QID) | ORAL | Status: DC | PRN

## 2023-09-07 MED ORDER — REVEFENACIN 175 MCG/3ML IN SOLN
175.0000 ug | Freq: Every day | RESPIRATORY_TRACT | Status: DC
  Administered 2023-09-07 – 2023-09-11 (×5): 175 ug via RESPIRATORY_TRACT
  Filled 2023-09-07 (×6): qty 3

## 2023-09-07 MED ORDER — BUDESONIDE 0.25 MG/2ML IN SUSP
0.2500 mg | Freq: Two times a day (BID) | RESPIRATORY_TRACT | Status: DC
  Administered 2023-09-07 – 2023-09-11 (×8): 0.25 mg via RESPIRATORY_TRACT
  Filled 2023-09-07 (×8): qty 2

## 2023-09-07 MED ORDER — LOSARTAN POTASSIUM 25 MG PO TABS
25.0000 mg | ORAL_TABLET | Freq: Every day | ORAL | Status: DC
  Administered 2023-09-07 – 2023-09-11 (×4): 25 mg via ORAL
  Filled 2023-09-07 (×5): qty 1

## 2023-09-07 MED ORDER — ACETAMINOPHEN-CODEINE 300-30 MG PO TABS
1.0000 | ORAL_TABLET | Freq: Four times a day (QID) | ORAL | Status: DC | PRN
  Administered 2023-09-07 – 2023-09-11 (×9): 1 via ORAL
  Filled 2023-09-07 (×9): qty 1

## 2023-09-07 MED ORDER — LEVALBUTEROL HCL 0.63 MG/3ML IN NEBU: 0.6300 mg | INHALATION_SOLUTION | RESPIRATORY_TRACT | Status: DC | PRN

## 2023-09-07 NOTE — Plan of Care (Signed)
  Problem: Education: Goal: Knowledge of risk factors and measures for prevention of condition will improve 09/07/2023 1259 by Loralie Champagne, RN Outcome: Progressing 09/07/2023 1258 by Loralie Champagne, RN Outcome: Progressing   Problem: Coping: Goal: Psychosocial and spiritual needs will be supported 09/07/2023 1259 by Loralie Champagne, RN Outcome: Progressing 09/07/2023 1258 by Loralie Champagne, RN Outcome: Progressing   Problem: Respiratory: Goal: Complications related to the disease process, condition or treatment will be avoided or minimized Outcome: Progressing   Problem: Education: Goal: Knowledge of General Education information will improve Description: Including pain rating scale, medication(s)/side effects and non-pharmacologic comfort measures Outcome: Progressing   Problem: Health Behavior/Discharge Planning: Goal: Ability to manage health-related needs will improve Outcome: Progressing   Problem: Clinical Measurements: Goal: Ability to maintain clinical measurements within normal limits will improve Outcome: Progressing Goal: Will remain free from infection Outcome: Progressing Goal: Diagnostic test results will improve Outcome: Progressing Goal: Respiratory complications will improve Outcome: Progressing Goal: Cardiovascular complication will be avoided Outcome: Progressing   Problem: Activity: Goal: Risk for activity intolerance will decrease Outcome: Progressing   Problem: Nutrition: Goal: Adequate nutrition will be maintained Outcome: Progressing   Problem: Coping: Goal: Level of anxiety will decrease Outcome: Progressing   Problem: Elimination: Goal: Will not experience complications related to bowel motility Outcome: Progressing Goal: Will not experience complications related to urinary retention Outcome: Progressing   Problem: Pain Managment: Goal: General experience of comfort will improve and/or be  controlled Outcome: Progressing   Problem: Safety: Goal: Ability to remain free from injury will improve Outcome: Progressing   Problem: Skin Integrity: Goal: Risk for impaired skin integrity will decrease Outcome: Progressing

## 2023-09-07 NOTE — Progress Notes (Signed)
 Patient arrived to unit with large bag of home medications. When alert, this RN explained that family would need to take medications home. Security sheet signed by this RN and pharmacist and medications left to keep in the hospital pharmacy. Oncoming nurse and charge nurse made aware.

## 2023-09-07 NOTE — Progress Notes (Signed)
 PROGRESS NOTE Brittany Irwin  UJW:[id number] DOB: 1940/10/20 DOA: 09/05/2023 PCP: Teodoro Spray, MD  Brief Narrative/Hospital Course: 89 yof w/ HTN, chronic diastolic CHF, s/p PPM, COPD, chronic respiratory failure with hypoxia on 3 L [place],CKD 3A,  HX of SBO, Crohn's disease, chronic back pain, anxiety, and anemia presented with altered mental status generalized weakness cough and fever.  Recently admit 1/22-1/26 for SBO w/ acute metabolic encephalopathy improved with conservative management. In the ED: Fever up to 102.7, tachycardic initial oxygen 94%.Labs-positive COVID,Normal lactate and normal white blood cell count.UA with 11-20 white cells.CXR-without any infiltrate.CT head without any intracranial abnormality.  Patient subsequent PRN nonrebreather and BIPAP and admitted to stepdown unit      Subjective: Seen and examined overnight patient remains tachycardic BP well-controlled in 1 to 2-200, has been needing nonrebreather> this morning on HFNC 6 L Labs shows creatinine improved to 0.9 otherwise labs fairly stable  Patient was sleeping had to try multiple times to wake her up able to tell me her name that she is in the hospital denies any complaint-but she goes back to sleep quickly. Later she did wake up alert awake oriented and ABG cancelled  Assessment and Plan: Principal Problem:   COVID-19 virus infection Active Problems:   Sepsis without acute organ dysfunction (HCC)   Acute on chronic hypoxic respiratory failure (HCC)   Dehydration   Hypotension due to hypovolemia   COVID   Sepsis POA COVID-19 infection: Patient presented with fever, hypotension tachycardia with pulmonary infiltrates positive for COVID suggestive of sepsis on presentation with increased oxygen demand to 4 L from 3 L at baseline.  CXR without any infiltrate.  Being managed with remdesivir, supple oxygen, add Yupelri/Brovana/Pulmicort.  Appears drowsy will check ABG.  Patient with history of agitation had  secondary steroids in the past.  This morning oxygen is down to 6 L and reports shortness of breath is improving.  Will continue current inhalers added as below along with supplemental oxygen I-S PT OT ambulation  Acute on chronic hypoxic respiratory failure Acute COPD exacerbation: Patient had worsening hypoxia from baseline -multifactorial  from COPD exacerbation, COVID infection, sepsis on baseline 3 L [place] .  Has been needing nonrebreather/BiPAP thsi am in HFNC. Continue supplemental oxygen to maintain saturation at least 92%,  add morning/Pulmicort/open  PRN Xopenex.   Hypertension urgency Chronic CHF with preserved EF Hypotensive initially: BP is poorly controlled this time, resume losartan.  Added  iv hydralazine  prn  Crohn's disease Recent SBO in January GERD: Continue PPI.  Follow[facility] at [facility].  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  Continue to monitor   AKI on CKD 3a Baseline creatinine around 1.0-1.2. Creatinine slightly elevated to 1.4 on admission.  Has improved to 1.1 again.  Received IV fluids during hospitalization.   Hypotension Has improved at this time.  Latest blood pressure of 107/62.  HLD: Continue high-fiber/Zetia.  Anxiety disorder Chronic back pain Neuropathy: At baseline on Xanax as needed, Lyrica, pain management, continue the same.  DVT prophylaxis: enoxaparin (LOVENOX) injection 40 mg Start: 09/06/23 2200 Code Status:   Code Status: Full Code Family Communication: plan of care discussed with patient at bedside. Patient status is: Remains hospitalized because of severity of illness Level of care: Stepdown   Dispo: The patient is from: home            Anticipated disposition: TBD Objective: Vitals last 24 hrs: Vitals:   09/07/23 0650 09/07/23 0700 09/07/23 0715 09/07/23  0953  BP:  (!) 171/75 (!) 183/93   Pulse: (!) 109 (!) 117 (!) 124   Resp: (!) 24 (!) 24 (!) 21   Temp:  98.6 F (37 C)     TempSrc:  Oral    SpO2: 93% 92% (!) 88% 94%  Weight:      Height:       Weight change: 3.4 kg  Physical Examination: General exam: sleepy abel to wake up> later on alert awake oriented HEENT:Oral mucosa moist, Ear/Nose WNL grossly Respiratory system: Bilaterally diminished BS,no use of accessory muscle Cardiovascular system: S1 & S2 +, No JVD. Gastrointestinal system: Abdomen soft,NT,ND, BS+ Nervous System: Somnolent  Extremities: LE edema neg,distal peripheral pulses palpable and warm.  Skin: No rashes,no icterus. MSK: Normal muscle bulk,tone, power   Medications reviewed:  Scheduled Meds:  arformoterol  15 mcg Nebulization BID   budesonide (PULMICORT) nebulizer solution  0.25 mg Nebulization BID   Chlorhexidine Gluconate Cloth  6 each Topical Daily   enoxaparin (LOVENOX) injection  40 mg Subcutaneous Q24H   ezetimibe  10 mg Oral QHS   fenofibrate  160 mg Oral Daily   losartan  25 mg Oral Daily   pantoprazole  40 mg Oral Daily   potassium chloride SA  20 mEq Oral Daily   pregabalin  50 mg Oral BID   revefenacin  175 mcg Nebulization Daily   Continuous Infusions:  remdesivir 100 mg in sodium chloride 0.9 % 100 mL IVPB Stopped (09/06/23 1937)    Diet Order             Diet Heart Room service appropriate? Yes; Fluid consistency: Thin  Diet effective now                   Intake/Output Summary (Last 24 hours) at 09/07/2023 1017 Last data filed at 09/07/2023 0700 Gross per 24 hour  Intake 220 ml  Output 400 ml  Net -180 ml   Net IO Since Admission: 1,111.08 mL [09/07/23 1017]  Wt Readings from Last 3 Encounters:  09/06/23 71.4 kg  08/11/23 68 kg  10/21/22 68.9 kg     Unresulted Labs (From admission, onward)     Start     Ordered   09/07/23 1018  Blood gas, arterial  Once,   R        09/07/23 1017          Data Reviewed: I have personally reviewed following labs and imaging studies CBC: Recent Labs  Lab 09/05/23 1755 09/06/23 0616 09/07/23 0317   WBC 9.5 10.5 4.4  NEUTROABS 8.4*  --   --   HGB 11.5* 13.3 10.9*  HCT 36.6 42.7 35.3*  MCV 94.8 97.5 97.2  PLT 174 210 143*   Basic Metabolic Panel:  Recent Labs  Lab 09/05/23 1755 09/06/23 0616 09/07/23 0317  NA 136 137 141  K 5.2* 4.7 4.3  CL 102 105 108  CO2 25 18* 21*  GLUCOSE 119* 157* 100*  BUN 28* 24* 23  CREATININE 1.40* 1.19* 0.94  CALCIUM 8.8* 8.2* 8.8*  MG  --  1.8 2.1  PHOS  --  3.9  --    GFR: Estimated Creatinine Clearance: 45.5 mL/min (by C-G formula based on SCr of 0.94 mg/dL). Liver Function Tests:  Recent Labs  Lab 09/05/23 1755  AST 33  ALT 11  ALKPHOS 32*  BILITOT 0.6  PROT 7.1  ALBUMIN 3.8   Recent Labs  Lab 09/05/23 1755  LIPASE 32  No results for input(s): "AMMONIA" in the last 168 hours. No results for input(s): "CHOL", "HDL", "LDLCALC", "TRIG", "CHOLHDL", "LDLDIRECT" in the last 72 hours. No results for input(s): "TSH", "T4TOTAL", "FREET4", "T3FREE", "THYROIDAB" in the last 72 hours. Sepsis Labs: Recent Labs  Lab 09/05/23 1800 09/06/23 0626  LATICACIDVEN 1.6 2.9*   Recent Results (from the past 240 hours)  Resp panel by RT-PCR (RSV, Flu A&B, Covid) Urine, Clean Catch     Status: Abnormal   Collection Time: 09/05/23  5:54 PM   Specimen: Urine, Clean Catch; Nasal Swab  Result Value Ref Range Status   SARS Coronavirus 2 by RT PCR POSITIVE (A) NEGATIVE Final    Comment: (NOTE) SARS-CoV-2 target nucleic acids are DETECTED.  The SARS-CoV-2 RNA is generally detectable in upper respiratory specimens during the acute phase of infection. Positive results are indicative of the presence of the identified virus, but do not rule out bacterial infection or co-infection with other pathogens not detected by the test. Clinical correlation with patient history and other diagnostic information is necessary to determine patient infection status. The expected result is Negative.  Fact Sheet for  Patients: BloggerCourse.com  Fact Sheet for Healthcare Providers: SeriousBroker.it  This test is not yet approved or cleared by the [place] FDA and  has been authorized for detection and/or diagnosis of SARS-CoV-2 by FDA under an Emergency Use Authorization (EUA).  This EUA will remain in effect (meaning this test can be used) for the duration of  the COVID-19 declaration under Section 564(b)(1) of the A ct, 21 U.S.C. section 360bbb-3(b)(1), unless the authorization is terminated or revoked sooner.     Influenza A by PCR NEGATIVE NEGATIVE Final   Influenza B by PCR NEGATIVE NEGATIVE Final    Comment: (NOTE) The Xpert Xpress SARS-CoV-2/FLU/RSV plus assay is intended as an aid in the diagnosis of influenza from Nasopharyngeal swab specimens and should not be used as a sole basis for treatment. Nasal washings and aspirates are unacceptable for Xpert Xpress SARS-CoV-2/FLU/RSV testing.  Fact Sheet for Patients: BloggerCourse.com  Fact Sheet for Healthcare Providers: SeriousBroker.it  This test is not yet approved or cleared by the [place] FDA and has been authorized for detection and/or diagnosis of SARS-CoV-2 by FDA under an Emergency Use Authorization (EUA). This EUA will remain in effect (meaning this test can be used) for the duration of the COVID-19 declaration under Section 564(b)(1) of the Act, 21 U.S.C. section 360bbb-3(b)(1), unless the authorization is terminated or revoked.     Resp Syncytial Virus by PCR NEGATIVE NEGATIVE Final    Comment: (NOTE) Fact Sheet for Patients: BloggerCourse.com  Fact Sheet for Healthcare Providers: SeriousBroker.it  This test is not yet approved or cleared by the [place] FDA and has been authorized for detection and/or diagnosis of SARS-CoV-2 by FDA under an Emergency Use  Authorization (EUA). This EUA will remain in effect (meaning this test can be used) for the duration of the COVID-19 declaration under Section 564(b)(1) of the Act, 21 U.S.C. section 360bbb-3(b)(1), unless the authorization is terminated or revoked.  Performed at [facility], [address], [place], [place] [address]   Urine Culture     Status: Abnormal   Collection Time: 09/05/23  5:57 PM   Specimen: Urine, Clean Catch  Result Value Ref Range Status   Specimen Description   Final    URINE, CLEAN CATCH Performed at [facility], [address], [place], [place] [address]    Special Requests  Final    NONE Performed at [facility], [address] [place], [place] [address]    Culture MULTIPLE SPECIES PRESENT, SUGGEST RECOLLECTION (A)  Final   Report Status 09/06/2023 FINAL  Final  Culture, blood (Routine X 2) w Reflex to ID Panel     Status: None (Preliminary result)   Collection Time: 09/05/23  6:05 PM   Specimen: BLOOD  Result Value Ref Range Status   Specimen Description   Final    BLOOD LEFT ANTECUBITAL Performed at [facility], [address] [place], [place] [address]    Special Requests   Final    BOTTLES DRAWN AEROBIC AND ANAEROBIC Blood Culture results may not be optimal due to an inadequate volume of blood received in culture bottles Performed at [facility], [name], [place], [place] [address]    Culture   Final    NO GROWTH 2 DAYS Performed at [facility], [address] [place], [place] [address]    Report Status PENDING  Incomplete  Culture, blood (Routine X 2) w Reflex to ID Panel     Status: None (Preliminary result)   Collection Time: 09/05/23  6:42 PM   Specimen: BLOOD  Result Value Ref Range Status   Specimen Description   Final    BLOOD RIGHT ANTECUBITAL Performed at [facility], [address], [place], [place]  [address]    Special Requests   Final    BOTTLES DRAWN AEROBIC AND ANAEROBIC Blood Culture results may not be optimal due to an inadequate volume of blood received in culture bottles Performed at [facility], [address] [place], [place] [address]    Culture   Final    NO GROWTH 2 DAYS Performed at [facility], [address] [place], [place] [address]    Report Status PENDING  Incomplete    Antimicrobials/Microbiology: Anti-infectives (From admission, onward)    Start     Dose/Rate Route Frequency Ordered Stop   09/06/23 1000  remdesivir 100 mg in sodium chloride 0.9 % 100 mL IVPB       Placed in "Followed by" Linked Group   100 mg 200 mL/hr over 30 Minutes Intravenous Daily 09/05/23 2308 09/08/23 0959   09/05/23 2330  remdesivir 200 mg in sodium chloride 0.9% 250 mL IVPB       Placed in "Followed by" Linked Group   200 mg 580 mL/hr over 30 Minutes Intravenous Once 09/05/23 2308 09/06/23 0019         Component Value Date/Time   SDES  09/05/2023 1842    BLOOD RIGHT ANTECUBITAL Performed at [facility], [address], [place], [place] [address]    SPECREQUEST  09/05/2023 1842    BOTTLES DRAWN AEROBIC AND ANAEROBIC Blood Culture results may not be optimal due to an inadequate volume of blood received in culture bottles Performed at [facility], [address] [place], [place] [address]    CULT  09/05/2023 1842    NO GROWTH 2 DAYS Performed at [facility], [address], [place], [place] [address]    REPTSTATUS PENDING 09/05/2023 1842     Radiology Studies: [facility] Chest Port 1 View Result Date: 09/05/2023 CLINICAL DATA:  fever EXAM: PORTABLE CHEST 1 VIEW COMPARISON:  August 11, 2023 FINDINGS: Evaluation is limited by rotation. The cardiomediastinal silhouette is unchanged and enlarged in contour with similar prominence of the RIGHT  pericardial border.LEFT chest cardiac pacing device. No pleural  effusion. No pneumothorax. No acute pleuroparenchymal abnormality. IMPRESSION: No acute cardiopulmonary abnormality. Electronically Signed   By: Meda Klinefelter M.D.   On: 09/05/2023 19:44   CT Head Wo Contrast Result Date: 09/05/2023 CLINICAL DATA:  Mental status change, unknown cause EXAM: CT HEAD WITHOUT CONTRAST TECHNIQUE: Contiguous axial images were obtained from the base of the skull through the vertex without intravenous contrast. RADIATION DOSE REDUCTION: This exam was performed according to the departmental dose-optimization program which includes automated exposure control, adjustment of the mA and/or kV according to patient size and/or use of iterative reconstruction technique. COMPARISON:  None Available. FINDINGS: Brain: No evidence of acute infarction, hemorrhage, hydrocephalus, extra-axial collection or mass lesion/mass effect. Vascular: No hyperdense vessel. Skull: No acute fracture. Sinuses/Orbits: Clear sinuses.  No acute orbital findings. Other: No mastoid effusions. IMPRESSION: No evidence of acute intracranial abnormality. Electronically Signed   By: Feliberto Harts M.D.   On: 09/05/2023 19:40     LOS: 1 day   Total time spent in review of labs and imaging, patient evaluation, formulation of plan, documentation and communication with family: 50 minutes  Lanae Boast, MD  [facility]  09/07/2023, 10:17 AM

## 2023-09-07 NOTE — Progress Notes (Signed)
   09/07/23 1527  TOC Brief Assessment  Insurance and Status Reviewed  Patient has primary care physician Yes  Home environment has been reviewed Apartment  Prior level of function: Independent/modified independent  Prior/Current Home Services No current home services  Social Drivers of Health Review SDOH reviewed no interventions necessary  Readmission risk has been reviewed Yes  Transition of care needs transition of care needs identified, TOC will continue to follow   Pt on 3L of O2 at baseline. TOC following for discharge needs.

## 2023-09-08 DIAGNOSIS — U071 COVID-19: Secondary | ICD-10-CM | POA: Diagnosis not present

## 2023-09-08 LAB — COMPREHENSIVE METABOLIC PANEL
ALT: 13 U/L (ref 0–44)
AST: 41 U/L (ref 15–41)
Albumin: 3 g/dL — ABNORMAL LOW (ref 3.5–5.0)
Alkaline Phosphatase: 28 U/L — ABNORMAL LOW (ref 38–126)
Anion gap: 7 (ref 5–15)
BUN: 19 mg/dL (ref 8–23)
CO2: 22 mmol/L (ref 22–32)
Calcium: 8.5 mg/dL — ABNORMAL LOW (ref 8.9–10.3)
Chloride: 109 mmol/L (ref 98–111)
Creatinine, Ser: 0.9 mg/dL (ref 0.44–1.00)
GFR, Estimated: >60 mL/min (ref >60)
Glucose, Bld: 120 mg/dL — ABNORMAL HIGH (ref 70–99)
Potassium: 4.4 mmol/L (ref 3.5–5.1)
Sodium: 138 mmol/L (ref 135–145)
Total Bilirubin: 0.7 mg/dL (ref 0.0–1.2)
Total Protein: 6.2 g/dL — ABNORMAL LOW (ref 6.5–8.1)

## 2023-09-08 LAB — CBC
HCT: 38.7 % (ref 36.0–46.0)
Hemoglobin: 11.7 g/dL — ABNORMAL LOW (ref 12.0–15.0)
MCH: 29.3 pg (ref 26.0–34.0)
MCHC: 30.2 g/dL (ref 30.0–36.0)
MCV: 97 fL (ref 80.0–100.0)
Platelets: 166 10*3/uL (ref 150–400)
RBC: 3.99 MIL/uL (ref 3.87–5.11)
RDW: 13.3 % (ref 11.5–15.5)
WBC: 3.5 10*3/uL — ABNORMAL LOW (ref 4.0–10.5)
nRBC: 0 % (ref 0.0–0.2)

## 2023-09-08 NOTE — Progress Notes (Signed)
 PROGRESS NOTE Brittany Irwin  ZOX:[id number] DOB: 1941-06-03 DOA: 09/05/2023 PCP: Teodoro Spray, MD  Brief Narrative/Hospital Course: 78 yof w/ HTN, chronic diastolic CHF, s/p PPM, COPD, chronic respiratory failure with hypoxia on 3 L [place],CKD 3A,  HX of SBO, Crohn's disease, chronic back pain, anxiety, and anemia presented with altered mental status generalized weakness cough and fever.  Recently admit 1/22-1/26 for SBO w/ acute metabolic encephalopathy improved with conservative management. In the ED: Fever up to 102.7, tachycardic initial oxygen 94%.Labs-positive COVID,Normal lactate and normal white blood cell count.UA with 11-20 white cells.CXR-without any infiltrate.CT head without any intracranial abnormality.  Patient subsequent PRN nonrebreather and BIPAP and admitted to stepdown unit   Subjective: Seen this am She feels much better Overnight afebrile vitals fairly stable needing 6 L HFNC> weaned to 5l and doing well Labs  CBC BMP with mild leukopenia  Assessment and Plan: Principal Problem:   COVID-19 virus infection Active Problems:   Sepsis without acute organ dysfunction (HCC)   Acute on chronic hypoxic respiratory failure (HCC)   Dehydration   Hypotension due to hypovolemia   COVID   Sepsis POA COVID-19 infection: Patient presented with fever, hypotension tachycardia with pulmonary infiltrates positive for COVID suggestive of sepsis on presentation with increased oxygen demand. She told me today she uses 4l [place] mostly.CXR without any infiltrate. S/p Remdesivir. Cont supplemental oxygen currently necessitating HFNC-on admission needed BiPAP/nonrebreather. Started Yupelri/Brovana/Pulmicort and nebs 2/18. Continue current respiratory support incentive spirometry, PT OT and ambulation   Acute on chronic hypoxic respiratory failure Acute COPD exacerbation: Patient had worsening hypoxia from baseline -multifactorial  from COPD exacerbation, COVID infection, sepsis on baseline  3 L [place]. Has been needing nonrebreather/BiPAP on admission, now on 6l HFNC>weaned to 5l. Cont Brovana/Yupelri/Pulmicor prn Xopenex.  Patient had hallucination and agitation with steroid in the past-but if fails to respond quickly can potentially -she is somewhat reluctant but on exam she has no wheezing  today  Hypertension urgency Chronic CHF with preserved EF Hypotensive initially: BP poorly controlled  2/18-resumed losartan added  iv hydralazine  prn and stable now  Crohn's disease Recent SBO in January GERD: Continue PPI, Tylenol #3/Imodium.Followed by GI at [facility].  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  Continue to monitor   AKI on CKD 3a Baseline creatinine around  1.0.  1.4 on admit and improved to 0.9 now.  Monitor intermittently  HLD: Continue high-fiber/Zetia.  Anxiety disorder Chronic back pain Neuropathy: Continue home home  PRN Xanax, Lyrica, pain management  DVT prophylaxis: enoxaparin (LOVENOX) injection 40 mg Start: 09/06/23 2200 Code Status:   Code Status: Full Code Family Communication: plan of care discussed with patient at bedside. Patient status is: Remains hospitalized because of severity of illness Level of care: Stepdown   Dispo: The patient is from: home            Anticipated disposition: TBD.  Will consult PT OT for ambulation today Objective: Vitals last 24 hrs: Vitals:   09/08/23 0700 09/08/23 0800 09/08/23 0900 09/08/23 1000  BP:  124/65  (!) 106/56  Pulse: 76 75 61 79  Resp: 20 15 16 14   Temp:      TempSrc:      SpO2: 96% 98% 98% 95%  Weight:      Height:       Weight change:   Physical Examination: General exam: alert awake, pleasant,  HEENT:Oral mucosa  moist, Ear/Nose WNL grossly Respiratory system: Bilaterally air entry present mild scattered crackles ,no use of accessory muscle Cardiovascular system: S1 & S2 +, No JVD. Gastrointestinal  system: Abdomen soft,NT,ND, BS+ Nervous System: Alert, awake, moving all extremities,and following commands. Extremities: LE edema neg,distal peripheral pulses palpable and warm.  Skin: No rashes,no icterus. MSK: Normal muscle bulk,tone, power   Medications reviewed:  Scheduled Meds:  arformoterol  15 mcg Nebulization BID   budesonide (PULMICORT) nebulizer solution  0.25 mg Nebulization BID   Chlorhexidine Gluconate Cloth  6 each Topical Daily   enoxaparin (LOVENOX) injection  40 mg Subcutaneous Q24H   ezetimibe  10 mg Oral QHS   fenofibrate  160 mg Oral Daily   losartan  25 mg Oral Daily   pantoprazole  40 mg Oral Daily   potassium chloride SA  20 mEq Oral Daily   pregabalin  50 mg Oral BID   revefenacin  175 mcg Nebulization Daily   Continuous Infusions:    Diet Order             Diet Heart Room service appropriate? Yes; Fluid consistency: Thin  Diet effective now                   Intake/Output Summary (Last 24 hours) at 09/08/2023 1135 Last data filed at 09/08/2023 1105 Gross per 24 hour  Intake 290 ml  Output 700 ml  Net -410 ml   Net IO Since Admission: 701.08 mL [09/08/23 1135]  Wt Readings from Last 3 Encounters:  09/06/23 71.4 kg  08/11/23 68 kg  10/21/22 68.9 kg     Unresulted Labs (From admission, onward)     Start     Ordered   09/08/23 0500  CBC  Daily,   R     Question:  Specimen collection method  Answer:  Lab=Lab collect   09/07/23 1154   09/08/23 0500  Comprehensive metabolic panel  Daily,   R     Question:  Specimen collection method  Answer:  Lab=Lab collect   09/07/23 1154          Data Reviewed: I have personally reviewed following labs and imaging studies CBC: Recent Labs  Lab 09/05/23 1755 09/06/23 0616 09/07/23 0317 09/08/23 0315  WBC 9.5 10.5 4.4 3.5*  NEUTROABS 8.4*  --   --   --   HGB 11.5* 13.3 10.9* 11.7*  HCT 36.6 42.7 35.3* 38.7  MCV 94.8 97.5 97.2 97.0  PLT 174 210 143* 166   Basic Metabolic Panel:  Recent  Labs  Lab 09/05/23 1755 09/06/23 0616 09/07/23 0317 09/08/23 0315  NA 136 137 141 138  K 5.2* 4.7 4.3 4.4  CL 102 105 108 109  CO2 25 18* 21* 22  GLUCOSE 119* 157* 100* 120*  BUN 28* 24* 23 19  CREATININE 1.40* 1.19* 0.94 0.90  CALCIUM 8.8* 8.2* 8.8* 8.5*  MG  --  1.8 2.1  --   PHOS  --  3.9  --   --    GFR: Estimated Creatinine Clearance: 47.5 mL/min (by C-G formula based on SCr of 0.9 mg/dL). Liver Function Tests:  Recent Labs  Lab 09/05/23 1755 09/08/23 0315  AST 33 41  ALT 11 13  ALKPHOS 32* 28*  BILITOT 0.6 0.7  PROT 7.1 6.2*  ALBUMIN 3.8 3.0*   Recent Labs  Lab 09/05/23 1755  LIPASE 32   No results for input(s): "AMMONIA" in the last 168 hours. No results for input(s): "CHOL", "HDL", "LDLCALC", "  TRIG", "CHOLHDL", "LDLDIRECT" in the last 72 hours. No results for input(s): "TSH", "T4TOTAL", "FREET4", "T3FREE", "THYROIDAB" in the last 72 hours. Sepsis Labs: Recent Labs  Lab 09/05/23 1800 09/06/23 0626  LATICACIDVEN 1.6 2.9*   Recent Results (from the past 240 hours)  Resp panel by RT-PCR (RSV, Flu A&B, Covid) Urine, Clean Catch     Status: Abnormal   Collection Time: 09/05/23  5:54 PM   Specimen: Urine, Clean Catch; Nasal Swab  Result Value Ref Range Status   SARS Coronavirus 2 by RT PCR POSITIVE (A) NEGATIVE Final    Comment: (NOTE) SARS-CoV-2 target nucleic acids are DETECTED.  The SARS-CoV-2 RNA is generally detectable in upper respiratory specimens during the acute phase of infection. Positive results are indicative of the presence of the identified virus, but do not rule out bacterial infection or co-infection with other pathogens not detected by the test. Clinical correlation with patient history and other diagnostic information is necessary to determine patient infection status. The expected result is Negative.  Fact Sheet for Patients: BloggerCourse.com  Fact Sheet for Healthcare  Providers: SeriousBroker.it  This test is not yet approved or cleared by the [place] FDA and  has been authorized for detection and/or diagnosis of SARS-CoV-2 by FDA under an Emergency Use Authorization (EUA).  This EUA will remain in effect (meaning this test can be used) for the duration of  the COVID-19 declaration under Section 564(b)(1) of the A ct, 21 U.S.C. section 360bbb-3(b)(1), unless the authorization is terminated or revoked sooner.     Influenza A by PCR NEGATIVE NEGATIVE Final   Influenza B by PCR NEGATIVE NEGATIVE Final    Comment: (NOTE) The Xpert Xpress SARS-CoV-2/FLU/RSV plus assay is intended as an aid in the diagnosis of influenza from Nasopharyngeal swab specimens and should not be used as a sole basis for treatment. Nasal washings and aspirates are unacceptable for Xpert Xpress SARS-CoV-2/FLU/RSV testing.  Fact Sheet for Patients: BloggerCourse.com  Fact Sheet for Healthcare Providers: SeriousBroker.it  This test is not yet approved or cleared by the [place] FDA and has been authorized for detection and/or diagnosis of SARS-CoV-2 by FDA under an Emergency Use Authorization (EUA). This EUA will remain in effect (meaning this test can be used) for the duration of the COVID-19 declaration under Section 564(b)(1) of the Act, 21 U.S.C. section 360bbb-3(b)(1), unless the authorization is terminated or revoked.     Resp Syncytial Virus by PCR NEGATIVE NEGATIVE Final    Comment: (NOTE) Fact Sheet for Patients: BloggerCourse.com  Fact Sheet for Healthcare Providers: SeriousBroker.it  This test is not yet approved or cleared by the [place] FDA and has been authorized for detection and/or diagnosis of SARS-CoV-2 by FDA under an Emergency Use Authorization (EUA). This EUA will remain in effect (meaning this test can be  used) for the duration of the COVID-19 declaration under Section 564(b)(1) of the Act, 21 U.S.C. section 360bbb-3(b)(1), unless the authorization is terminated or revoked.  Performed at [facility], [address] [place], [place] [address]   Urine Culture     Status: Abnormal   Collection Time: 09/05/23  5:57 PM   Specimen: Urine, Clean Catch  Result Value Ref Range Status   Specimen Description   Final    URINE, CLEAN CATCH Performed at [facility], [address], [place], [place] [address]    Special Requests   Final    NONE Performed at [facility], [address] [place],  [place] [address]    Culture MULTIPLE SPECIES PRESENT, SUGGEST RECOLLECTION (A)  Final   Report Status 09/06/2023 FINAL  Final  Culture, blood (Routine X 2) w Reflex to ID Panel     Status: None (Preliminary result)   Collection Time: 09/05/23  6:05 PM   Specimen: BLOOD  Result Value Ref Range Status   Specimen Description   Final    BLOOD LEFT ANTECUBITAL Performed at [facility], [address], [place], [place] [address]    Special Requests   Final    BOTTLES DRAWN AEROBIC AND ANAEROBIC Blood Culture results may not be optimal due to an inadequate volume of blood received in culture bottles Performed at [facility], [address], [place], [place] [address]    Culture   Final    NO GROWTH 3 DAYS Performed at [facility], [address] [place], [place] [address]    Report Status PENDING  Incomplete  Culture, blood (Routine X 2) w Reflex to ID Panel     Status: None (Preliminary result)   Collection Time: 09/05/23  6:42 PM   Specimen: BLOOD  Result Value Ref Range Status   Specimen Description   Final    BLOOD RIGHT ANTECUBITAL Performed at [facility], [address], [place], [place] [address]    Special Requests   Final    BOTTLES DRAWN AEROBIC AND ANAEROBIC  Blood Culture results may not be optimal due to an inadequate volume of blood received in culture bottles Performed at [facility], [address] [place], [place] [address]    Culture   Final    NO GROWTH 3 DAYS Performed at [facility], [address] [place], [place] [address]    Report Status PENDING  Incomplete    Antimicrobials/Microbiology: Anti-infectives (From admission, onward)    Start     Dose/Rate Route Frequency Ordered Stop   09/06/23 1000  remdesivir 100 mg in sodium chloride 0.9 % 100 mL IVPB       Placed in "Followed by" Linked Group   100 mg 200 mL/hr over 30 Minutes Intravenous Daily 09/05/23 2308 09/07/23 1429   09/05/23 2330  remdesivir 200 mg in sodium chloride 0.9% 250 mL IVPB       Placed in "Followed by" Linked Group   200 mg 580 mL/hr over 30 Minutes Intravenous Once 09/05/23 2308 09/06/23 0019         Component Value Date/Time   SDES  09/05/2023 1842    BLOOD RIGHT ANTECUBITAL Performed at [facility], [address] [place] [address] [place], [place] [address]    SPECREQUEST  09/05/2023 1842    BOTTLES DRAWN AEROBIC AND ANAEROBIC Blood Culture results may not be optimal due to an inadequate volume of blood received in culture bottles Performed at [facility], [address], [place], [place] [address]    CULT  09/05/2023 1842    NO GROWTH 3 DAYS Performed at [facility], [address], [place], [place] [address]    REPTSTATUS PENDING 09/05/2023 1842     Radiology Studies: No results found.    LOS: 2 days   Total time spent in review of labs and imaging, patient evaluation, formulation of plan, documentation and communication with family: 50 minutes  Lanae Boast, MD  [facility]  09/08/2023, 11:35 AM

## 2023-09-08 NOTE — Plan of Care (Signed)

## 2023-09-09 DIAGNOSIS — U071 COVID-19: Secondary | ICD-10-CM | POA: Diagnosis not present

## 2023-09-09 LAB — COMPREHENSIVE METABOLIC PANEL
ALT: 11 U/L (ref 0–44)
AST: 30 U/L (ref 15–41)
Albumin: 2.9 g/dL — ABNORMAL LOW (ref 3.5–5.0)
Alkaline Phosphatase: 25 U/L — ABNORMAL LOW (ref 38–126)
Anion gap: 8 (ref 5–15)
BUN: 16 mg/dL (ref 8–23)
CO2: 23 mmol/L (ref 22–32)
Calcium: 8.4 mg/dL — ABNORMAL LOW (ref 8.9–10.3)
Chloride: 108 mmol/L (ref 98–111)
Creatinine, Ser: 0.83 mg/dL (ref 0.44–1.00)
GFR, Estimated: >60 mL/min (ref >60)
Glucose, Bld: 101 mg/dL — ABNORMAL HIGH (ref 70–99)
Potassium: 4.3 mmol/L (ref 3.5–5.1)
Sodium: 139 mmol/L (ref 135–145)
Total Bilirubin: 0.7 mg/dL (ref 0.0–1.2)
Total Protein: 6.1 g/dL — ABNORMAL LOW (ref 6.5–8.1)

## 2023-09-09 LAB — CBC
HCT: 37.7 % (ref 36.0–46.0)
Hemoglobin: 11.8 g/dL — ABNORMAL LOW (ref 12.0–15.0)
MCH: 30 pg (ref 26.0–34.0)
MCHC: 31.3 g/dL (ref 30.0–36.0)
MCV: 95.9 fL (ref 80.0–100.0)
Platelets: 149 10*3/uL — ABNORMAL LOW (ref 150–400)
RBC: 3.93 MIL/uL (ref 3.87–5.11)
RDW: 13.4 % (ref 11.5–15.5)
WBC: 2.8 10*3/uL — ABNORMAL LOW (ref 4.0–10.5)
nRBC: 0 % (ref 0.0–0.2)

## 2023-09-09 MED ORDER — GUAIFENESIN-DM 100-10 MG/5ML PO SYRP
10.0000 mL | ORAL_SOLUTION | ORAL | Status: DC | PRN
  Administered 2023-09-09 – 2023-09-11 (×6): 10 mL via ORAL
  Filled 2023-09-09 (×6): qty 10

## 2023-09-09 NOTE — Plan of Care (Signed)

## 2023-09-09 NOTE — Patient Instructions (Signed)
 Marland Kitchen

## 2023-09-09 NOTE — Progress Notes (Signed)
 PROGRESS NOTE Brittany Irwin  FAO:[id number] DOB: 1941/04/12 DOA: 09/05/2023 PCP: Teodoro Spray, MD  Brief Narrative/Hospital Course: 73 yof w/ HTN, chronic diastolic CHF, s/p PPM, COPD, chronic respiratory failure with hypoxia on 3 L [place],CKD 3A,  HX of SBO, Crohn's disease, chronic back pain, anxiety, and anemia presented with altered mental status generalized weakness cough and fever.  Recently admit 1/22-1/26 for SBO w/ acute metabolic encephalopathy improved with conservative management. In the ED: Fever up to 102.7, tachycardic initial oxygen 94%.Labs-positive COVID,Normal lactate and normal white blood cell count.UA with 11-20 white cells.CXR-without any infiltrate.CT head without any intracranial abnormality.  Patient subsequent PRN nonrebreather and BIPAP and admitted to stepdown unit.  Initially needing BiPAP and nonrebreather, managed with remdesivir aggressive bronchodilators. Overall clinically improving wean down to HFNC.  Patient reports using 4 L nasal cannula mostly at home.   Subjective:  Patient seen examined this morning Overnight afebrile BP stable on 5 L HFNC> weaned to 4 L and doing well.   Complaints of cough and chest pain with cough Labs remained stable leukopenia 2.8 and mild thrombocytopenia 149.  Assessment and Plan: Principal Problem:   COVID-19 virus infection Active Problems:   Sepsis without acute organ dysfunction (HCC)   Acute on chronic hypoxic respiratory failure (HCC)   Dehydration   Hypotension due to hypovolemia   COVID   Sepsis POA COVID-19 infection: Patient presented with fever, hypotension tachycardia with pulmonary infiltrates positive for COVID suggestive of sepsis on presentation with increased oxygen demand. PTA using 4l [place] mostly.CXR without any infiltrate. S/p Remdesivir.  No longer on BiPAP/NRB Overall clinically improving, oxygen requirement at baseline now. Cont Yupelri/Brovana/Pulmicort and nebs pulmonary toileting with incentive  spirometry PT OT about to work with her.    Acute on chronic hypoxic respiratory failure Acute COPD exacerbation: Patient had worsening hypoxia from baseline -multifactorial  from COPD exacerbation, COVID infection, sepsis on baseline  4 L [place].  Weaned oxygen to home setting. Continue aggressive pulmonary toileting inhalers as able.  Unfortunately unable to tolerate steroid due to hallucination and agitation he reports that he had to stop steroid in the past but if fails to respond quickly can potentially d osteroid trial  Hypertension urgency Chronic CHF with preserved EF Hypotensive initially: BP well-controlled.  Continue losartan prn hydralazine  Crohn's disease Recent SBO in January GERD: Continue PPI, Tylenol #3/Imodium.Followed by GI at [facility].  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  Continue to monitor   AKI on CKD 3a Baseline creatinine around  1.0.  1.4 on admit and improved to 0.8.Monitor intermittently  HLD: Continue high-fiber/Zetia.  Anxiety disorder Chronic back pain Neuropathy: Mood stable, continue home home  PRN Xanax, Lyrica, pain management  DVT prophylaxis: enoxaparin (LOVENOX) injection 40 mg Start: 09/06/23 2200 Code Status:   Code Status: Full Code Family Communication: plan of care discussed with patient at bedside. Patient status is: Remains hospitalized because of severity of illness Level of care: Stepdown > transfer to [facility]  Dispo: The patient is from: home            Anticipated disposition: TBD.  PT OT consulted  Objective: Vitals last 24 hrs: Vitals:   09/09/23 0700 09/09/23 0800 09/09/23 0822 09/09/23 0903  BP:  (!) 100/46 (!) 114/55   Pulse: 64 66 76   Resp: 16 16 14    Temp:   (!) 97.3 F (36.3 C)   TempSrc:  Oral   SpO2: 96% 96% 97% 97%  Weight:      Height:       Weight change:   Physical Examination: General exam: alert awake, oriented at  baseline, older than stated age HEENT:Oral mucosa moist, Ear/Nose WNL grossly Respiratory system: Bilaterally air entry present with no wheezing BS,no use of accessory muscle Cardiovascular system: S1 & S2 +, No JVD. Gastrointestinal system: Abdomen soft,NT,ND, BS+ Nervous System: Alert, awake, moving all extremities,and following commands. Extremities: LE edema neg,distal peripheral pulses palpable and warm.  Skin: No rashes,no icterus. MSK: Normal muscle bulk,tone, power   Medications reviewed:  Scheduled Meds:  arformoterol  15 mcg Nebulization BID   budesonide (PULMICORT) nebulizer solution  0.25 mg Nebulization BID   Chlorhexidine Gluconate Cloth  6 each Topical Daily   enoxaparin (LOVENOX) injection  40 mg Subcutaneous Q24H   ezetimibe  10 mg Oral QHS   fenofibrate  160 mg Oral Daily   losartan  25 mg Oral Daily   pantoprazole  40 mg Oral Daily   potassium chloride SA  20 mEq Oral Daily   pregabalin  50 mg Oral BID   revefenacin  175 mcg Nebulization Daily   Continuous Infusions:    Diet Order             Diet Heart Room service appropriate? Yes; Fluid consistency: Thin  Diet effective now                   Intake/Output Summary (Last 24 hours) at 09/09/2023 1137 Last data filed at 09/09/2023 0500 Gross per 24 hour  Intake 120 ml  Output 150 ml  Net -30 ml   Net IO Since Admission: 671.08 mL [09/09/23 1137]  Wt Readings from Last 3 Encounters:  09/09/23 71.8 kg  08/11/23 68 kg  10/21/22 68.9 kg     Unresulted Labs (From admission, onward)     Start     Ordered   09/08/23 0500  CBC  Daily,   R     Question:  Specimen collection method  Answer:  Lab=Lab collect   09/07/23 1154   09/08/23 0500  Comprehensive metabolic panel  Daily,   R     Question:  Specimen collection method  Answer:  Lab=Lab collect   09/07/23 1154          Data Reviewed: I have personally reviewed following labs and imaging studies CBC: Recent Labs  Lab 09/05/23 1755  09/06/23 0616 09/07/23 0317 09/08/23 0315 09/09/23 0308  WBC 9.5 10.5 4.4 3.5* 2.8*  NEUTROABS 8.4*  --   --   --   --   HGB 11.5* 13.3 10.9* 11.7* 11.8*  HCT 36.6 42.7 35.3* 38.7 37.7  MCV 94.8 97.5 97.2 97.0 95.9  PLT 174 210 143* 166 149*   Basic Metabolic Panel:  Recent Labs  Lab 09/05/23 1755 09/06/23 0616 09/07/23 0317 09/08/23 0315 09/09/23 0308  NA 136 137 141 138 139  K 5.2* 4.7 4.3 4.4 4.3  CL 102 105 108 109 108  CO2 25 18* 21* 22 23  GLUCOSE 119* 157* 100* 120* 101*  BUN 28* 24* 23 19 16   CREATININE 1.40* 1.19* 0.94 0.90 0.83  CALCIUM 8.8* 8.2* 8.8* 8.5* 8.4*  MG  --  1.8 2.1  --   --   PHOS  --  3.9  --   --   --    GFR: Estimated Creatinine Clearance: 51.6 mL/min (by C-G formula based on SCr of 0.83  mg/dL). Liver Function Tests:  Recent Labs  Lab 09/05/23 1755 09/08/23 0315 09/09/23 0308  AST 33 41 30  ALT 11 13 11   ALKPHOS 32* 28* 25*  BILITOT 0.6 0.7 0.7  PROT 7.1 6.2* 6.1*  ALBUMIN 3.8 3.0* 2.9*   Recent Labs  Lab 09/05/23 1755  LIPASE 32   No results for input(s): "AMMONIA" in the last 168 hours. No results for input(s): "CHOL", "HDL", "LDLCALC", "TRIG", "CHOLHDL", "LDLDIRECT" in the last 72 hours. No results for input(s): "TSH", "T4TOTAL", "FREET4", "T3FREE", "THYROIDAB" in the last 72 hours. Sepsis Labs: Recent Labs  Lab 09/05/23 1800 09/06/23 0626  LATICACIDVEN 1.6 2.9*   Recent Results (from the past 240 hours)  Resp panel by RT-PCR (RSV, Flu A&B, Covid) Urine, Clean Catch     Status: Abnormal   Collection Time: 09/05/23  5:54 PM   Specimen: Urine, Clean Catch; Nasal Swab  Result Value Ref Range Status   SARS Coronavirus 2 by RT PCR POSITIVE (A) NEGATIVE Final    Comment: (NOTE) SARS-CoV-2 target nucleic acids are DETECTED.  The SARS-CoV-2 RNA is generally detectable in upper respiratory specimens during the acute phase of infection. Positive results are indicative of the presence of the identified virus, but do not rule out  bacterial infection or co-infection with other pathogens not detected by the test. Clinical correlation with patient history and other diagnostic information is necessary to determine patient infection status. The expected result is Negative.  Fact Sheet for Patients: BloggerCourse.com  Fact Sheet for Healthcare Providers: SeriousBroker.it  This test is not yet approved or cleared by the [place] FDA and  has been authorized for detection and/or diagnosis of SARS-CoV-2 by FDA under an Emergency Use Authorization (EUA).  This EUA will remain in effect (meaning this test can be used) for the duration of  the COVID-19 declaration under Section 564(b)(1) of the A ct, 21 U.S.C. section 360bbb-3(b)(1), unless the authorization is terminated or revoked sooner.     Influenza A by PCR NEGATIVE NEGATIVE Final   Influenza B by PCR NEGATIVE NEGATIVE Final    Comment: (NOTE) The Xpert Xpress SARS-CoV-2/FLU/RSV plus assay is intended as an aid in the diagnosis of influenza from Nasopharyngeal swab specimens and should not be used as a sole basis for treatment. Nasal washings and aspirates are unacceptable for Xpert Xpress SARS-CoV-2/FLU/RSV testing.  Fact Sheet for Patients: BloggerCourse.com  Fact Sheet for Healthcare Providers: SeriousBroker.it  This test is not yet approved or cleared by the [place] FDA and has been authorized for detection and/or diagnosis of SARS-CoV-2 by FDA under an Emergency Use Authorization (EUA). This EUA will remain in effect (meaning this test can be used) for the duration of the COVID-19 declaration under Section 564(b)(1) of the Act, 21 U.S.C. section 360bbb-3(b)(1), unless the authorization is terminated or revoked.     Resp Syncytial Virus by PCR NEGATIVE NEGATIVE Final    Comment: (NOTE) Fact Sheet for  Patients: BloggerCourse.com  Fact Sheet for Healthcare Providers: SeriousBroker.it  This test is not yet approved or cleared by the [place] FDA and has been authorized for detection and/or diagnosis of SARS-CoV-2 by FDA under an Emergency Use Authorization (EUA). This EUA will remain in effect (meaning this test can be used) for the duration of the COVID-19 declaration under Section 564(b)(1) of the Act, 21 U.S.C. section 360bbb-3(b)(1), unless the authorization is terminated or revoked.  Performed at [facility], [address] [place], [place] [address]   Urine Culture  Status: Abnormal   Collection Time: 09/05/23  5:57 PM   Specimen: Urine, Clean Catch  Result Value Ref Range Status   Specimen Description   Final    URINE, CLEAN CATCH Performed at [facility], [address], [place], [place] [address]    Special Requests   Final    NONE Performed at [facility], [address], [place], [place] [address]    Culture MULTIPLE SPECIES PRESENT, SUGGEST RECOLLECTION (A)  Final   Report Status 09/06/2023 FINAL  Final  Culture, blood (Routine X 2) w Reflex to ID Panel     Status: None (Preliminary result)   Collection Time: 09/05/23  6:05 PM   Specimen: BLOOD  Result Value Ref Range Status   Specimen Description   Final    BLOOD LEFT ANTECUBITAL Performed at [facility], [address] [place], [place] [address]    Special Requests   Final    BOTTLES DRAWN AEROBIC AND ANAEROBIC Blood Culture results may not be optimal due to an inadequate volume of blood received in culture bottles Performed at [facility], [address], [place], [place] [address]    Culture   Final    NO GROWTH 4 DAYS Performed at [facility], [address] [place], [place] [address]    Report Status PENDING  Incomplete  Culture, blood  (Routine X 2) w Reflex to ID Panel     Status: None (Preliminary result)   Collection Time: 09/05/23  6:42 PM   Specimen: BLOOD  Result Value Ref Range Status   Specimen Description   Final    BLOOD RIGHT ANTECUBITAL Performed at [facility], [address] [place], [place] [address]    Special Requests   Final    BOTTLES DRAWN AEROBIC AND ANAEROBIC Blood Culture results may not be optimal due to an inadequate volume of blood received in culture bottles Performed at [facility], [address], [place], [place] [address]    Culture   Final    NO GROWTH 4 DAYS Performed at [facility], [address], [place], [place] [address]    Report Status PENDING  Incomplete    Antimicrobials/Microbiology: Anti-infectives (From admission, onward)    Start     Dose/Rate Route Frequency Ordered Stop   09/06/23 1000  remdesivir 100 mg in sodium chloride 0.9 % 100 mL IVPB       Placed in "Followed by" Linked Group   100 mg 200 mL/hr over 30 Minutes Intravenous Daily 09/05/23 2308 09/07/23 1429   09/05/23 2330  remdesivir 200 mg in sodium chloride 0.9% 250 mL IVPB       Placed in "Followed by" Linked Group   200 mg 580 mL/hr over 30 Minutes Intravenous Once 09/05/23 2308 09/06/23 0019         Component Value Date/Time   SDES  09/05/2023 1842    BLOOD RIGHT ANTECUBITAL Performed at [facility], [address] [place], [place] [address]    SPECREQUEST  09/05/2023 1842    BOTTLES DRAWN AEROBIC AND ANAEROBIC Blood Culture results may not be optimal due to an inadequate volume of blood received in culture bottles Performed at [facility], [address], [place], [place] [address]    CULT  09/05/2023 1842    NO GROWTH 4 DAYS Performed at [facility], [address], [place], [place] [address]  REPTSTATUS PENDING 09/05/2023 1842     Radiology Studies: No results found.    LOS: 3 days    Total time spent in review of labs and imaging, patient evaluation, formulation of plan, documentation and communication with family:  35 minutes  Lanae Boast, MD  [facility]  09/09/2023, 11:37 AM

## 2023-09-09 NOTE — Evaluation (Signed)
 Physical Therapy Evaluation Patient Details Name: Brittany Irwin MRN: [id number] DOB: April 26, 1941 Today's Date: 09/09/2023  History of Present Illness  Pt is 83 yo female admitted on 09/05/23 with sepsis and resp failure due to COVID and COPD exacerbation. Pt with hx including but not limited to HTN, chronic diastolic CHF, s/p PPM, COPD, chronic respiratory failure with hypoxia on 3 L [place],CKD 3A,  HX of SBO, Crohn's disease, chronic back pain, anxiety, and anemia  Clinical Impression  Pt admitted with above diagnosis. At baseline, pt lives alone, is independent, does use rollator for community ambulation and occasionally in home.  On 3-4 L O2 baseline.  Today, pt able to transfer and ambulate with CGA and min cues.  She did fatigue easily but VSS on 4 L O2.  Pt expected to progress well with therapy and likely no PT needs at d/c.  Pt currently with functional limitations due to the deficits listed below (see PT Problem List). Pt will benefit from acute skilled PT to increase their independence and safety with mobility to allow discharge.           If plan is discharge home, recommend the following: Assistance with cooking/housework;Help with stairs or ramp for entrance   Can travel by private vehicle        Equipment Recommendations None recommended by PT  Recommendations for Other Services       Functional Status Assessment Patient has had a recent decline in their functional status and demonstrates the ability to make significant improvements in function in a reasonable and predictable amount of time.     Precautions / Restrictions Precautions Precautions: Fall      Mobility  Bed Mobility Overal bed mobility: Needs Assistance Bed Mobility: Supine to Sit     Supine to sit: Supervision          Transfers Overall transfer level: Needs assistance Equipment used: Rolling walker (2 wheels) Transfers: Sit to/from Stand Sit to Stand: Contact guard assist                 Ambulation/Gait Ambulation/Gait assistance: Contact guard assist Gait Distance (Feet): 50 Feet Assistive device: Rolling walker (2 wheels) Gait Pattern/deviations: Step-to pattern, Decreased stride length Gait velocity: decreased     General Gait Details: ambulating 2 laps in room navigating around obstacles and making turns; CGA for safety; min cues for RW  Stairs            Wheelchair Mobility     Tilt Bed    Modified Rankin (Stroke Patients Only)       Balance Overall balance assessment: Needs assistance Sitting-balance support: No upper extremity supported Sitting balance-Leahy Scale: Good     Standing balance support: Reliant on assistive device for balance, Bilateral upper extremity supported Standing balance-Leahy Scale: Poor Standing balance comment: steady with RW                             Pertinent Vitals/Pain Pain Assessment Pain Assessment: No/denies pain    Home Living Family/patient expects to be discharged to:: Private residence Living Arrangements: Alone Available Help at Discharge: Family;Neighbor;Available PRN/intermittently Type of Home: Apartment Home Access: Level entry       Home Layout: One level Home Equipment: Agricultural consultant (2 wheels);Rollator (4 wheels);Grab bars - tub/shower;Hand held shower head      Prior Function Prior Level of Function : Independent/Modified Independent;Driving  Mobility Comments: Uses rollator when she goes out, occasionally in home.  On 3-4 L O2.  Can drive but typically does not (does not have a car) ADLs Comments: Independent with ADLs; typically orders groceries ; independent light iadls     Extremity/Trunk Assessment   Upper Extremity Assessment Upper Extremity Assessment: Overall WFL for tasks assessed    Lower Extremity Assessment Lower Extremity Assessment: LLE deficits/detail;RLE deficits/detail RLE Deficits / Details: ROM WFL; MMT 5/5 LLE Deficits /  Details: ROM WFL; MMT 5/5    Cervical / Trunk Assessment Cervical / Trunk Assessment: Normal  Communication        Cognition Arousal: Alert Behavior During Therapy: WFL for tasks assessed/performed   PT - Cognitive impairments: No apparent impairments                                 Cueing       General Comments General comments (skin integrity, edema, etc.): Pt on 4 L O2 with VSS rest and activity    Exercises     Assessment/Plan    PT Assessment Patient needs continued PT services  PT Problem List Decreased strength;Cardiopulmonary status limiting activity;Decreased range of motion;Decreased activity tolerance;Decreased balance;Decreased mobility;Decreased knowledge of use of DME       PT Treatment Interventions DME instruction;Therapeutic exercise;Gait training;Stair training;Functional mobility training;Therapeutic activities;Patient/family education;Balance training;Neuromuscular re-education    PT Goals (Current goals can be found in the Care Plan section)  Acute Rehab PT Goals Patient Stated Goal: return home PT Goal Formulation: With patient Time For Goal Achievement: 09/23/23 Potential to Achieve Goals: Good    Frequency Min 1X/week     Co-evaluation               AM-PAC PT "6 Clicks" Mobility  Outcome Measure Help needed turning from your back to your side while in a flat bed without using bedrails?: A Little Help needed moving from lying on your back to sitting on the side of a flat bed without using bedrails?: A Little Help needed moving to and from a bed to a chair (including a wheelchair)?: A Little Help needed standing up from a chair using your arms (e.g., wheelchair or bedside chair)?: A Little Help needed to walk in hospital room?: A Little Help needed climbing 3-5 steps with a railing? : A Little 6 Click Score: 18    End of Session Equipment Utilized During Treatment: Gait belt Activity Tolerance: Patient tolerated  treatment well Patient left: with call bell/phone within reach;with chair alarm set;in chair Nurse Communication: Mobility status PT Visit Diagnosis: Other abnormalities of gait and mobility (R26.89);Muscle weakness (generalized) (M62.81)    Time: 1610-9604 PT Time Calculation (min) (ACUTE ONLY): 28 min   Charges:   PT Evaluation $PT Eval Low Complexity: 1 Low PT Treatments $Gait Training: 8-22 mins PT General Charges $$ ACUTE PT VISIT: 1 Visit         Anise Salvo, PT Acute Rehab [facility] [phone]   Rayetta Humphrey 09/09/2023, 12:57 PM

## 2023-09-09 NOTE — Progress Notes (Signed)
 Attempt to call report x3 to 4W.

## 2023-09-09 NOTE — Evaluation (Signed)
 Occupational Therapy Evaluation Patient Details Name: Brittany Irwin MRN: [id number] DOB: Jan 23, 1941 Today's Date: 09/09/2023   History of Present Illness   Pt is 83 yo female admitted on 09/05/23 with sepsis and resp failure due to COVID and COPD exacerbation. Pt with hx including but not limited to HTN, chronic diastolic CHF, s/p PPM, COPD, chronic respiratory failure with hypoxia on 3 L [place],CKD 3A,  HX of SBO, Crohn's disease, chronic back pain, anxiety, and anemia     Clinical Impressions Pt admitted with the above diagnosis. Pt currently with functional limitations due to the deficits listed below (see OT Problem List). Prior to admit, pt was living alone and completing all ADL and functional mobility at Mod I/Independent level. Pt will benefit from acute skilled OT to increase their safety and independence with ADL and functional mobility for ADL to facilitate discharge. Pt provided with energy conservation education and handout during evaluation reviewing strategies for daily tasks at home. Recommend shower seat and pt educated that she may wear her oxygen while in the shower. Do not foresee any need for follow up OT services at discharge.       If plan is discharge home, recommend the following:   Assist for transportation;Assistance with cooking/housework     Functional Status Assessment   Patient has had a recent decline in their functional status and demonstrates the ability to make significant improvements in function in a reasonable and predictable amount of time.     Equipment Recommendations   Tub/shower seat      Precautions/Restrictions   Precautions Precautions: Fall Restrictions Weight Bearing Restrictions Per Provider Order: No     Mobility Bed Mobility Overal bed mobility: Needs Assistance       General bed mobility comments: Pt able to boost self up in bed with HOB flat. Increased time needed.    Transfers Overall transfer level:  (defer  to PT eval)       Balance Overall balance assessment: Mild deficits observed, not formally tested       ADL either performed or assessed with clinical judgement   ADL    General ADL Comments: Pt is able to complete BADL tasks with set-up/SBA. VC may be needed for energy conservation strategies. pt provided with education and handout for 5 P's of energy conservation. Pt able to verbalize strategies she is already utilizing and areas that she needs to work on.     Vision Baseline Vision/History: 1 Wears glasses Ability to See in Adequate Light: 0 Adequate Patient Visual Report: No change from baseline Vision Assessment?: Wears glasses for reading     Perception Perception: Within Functional Limits       Praxis Praxis: [facility]       Pertinent Vitals/Pain Pain Assessment Pain Assessment: Faces Faces Pain Scale: No hurt     Extremity/Trunk Assessment Upper Extremity Assessment Upper Extremity Assessment: Overall WFL for tasks assessed   Lower Extremity Assessment Lower Extremity Assessment: Defer to PT evaluation   Cervical / Trunk Assessment Cervical / Trunk Assessment: Normal   Communication Communication Communication: No apparent difficulties   Cognition Arousal: Alert Behavior During Therapy: WFL for tasks assessed/performed Cognition: No apparent impairments    Following commands: Intact       Cueing  General Comments   Cueing Techniques: Verbal cues              Home Living Family/patient expects to be discharged to:: Private residence Living Arrangements: Alone Available Help at Discharge: Family;Neighbor;Available PRN/intermittently Type  of Home: Apartment Home Access: Level entry     Home Layout: One level     Bathroom Shower/Tub: Chief Strategy Officer: Standard     Home Equipment: Agricultural consultant (2 wheels);Rollator (4 wheels);Grab bars - tub/shower;Hand held shower head          Prior Functioning/Environment Prior  Level of Function : Independent/Modified Independent;Driving    Mobility Comments: Uses rollator when she goes out, occasionally in home.  On 3-4 L O2.  Can drive but typically does not (does not have a car) ADLs Comments: Independent with ADL; typically orders groceries ; independent light IADL    OT Problem List: Decreased activity tolerance        OT Goals(Current goals can be found in the care plan section)   Acute Rehab OT Goals Patient Stated Goal: none stated OT Goal Formulation: With patient Time For Goal Achievement: 09/23/23 Potential to Achieve Goals: Good   OT Frequency:  Min 1X/week       AM-PAC OT "6 Clicks" Daily Activity     Outcome Measure Help from another person eating meals?: None Help from another person taking care of personal grooming?: None Help from another person toileting, which includes using toliet, bedpan, or urinal?: None Help from another person bathing (including washing, rinsing, drying)?: None Help from another person to put on and taking off regular upper body clothing?: None Help from another person to put on and taking off regular lower body clothing?: None 6 Click Score: 24   End of Session Equipment Utilized During Treatment: Oxygen  Activity Tolerance: Patient tolerated treatment well Patient left: in bed;with call bell/phone within reach;with bed alarm set  OT Visit Diagnosis: Unsteadiness on feet (R26.81);Muscle weakness (generalized) (M62.81)                Time: 1629-1700 OT Time Calculation (min): 31 min Charges:  OT General Charges $OT Visit: 1 Visit OT Evaluation $OT Eval Moderate Complexity: 1 Mod OT Treatments $Self Care/Home Management : 8-22 mins  Limmie Patricia, OTR/L,CBIS  Supplemental OT - [facility] and [facility] Secure Chat Preferred    Timea Breed, Charisse March 09/09/2023, 5:29 PM

## 2023-09-09 NOTE — Progress Notes (Signed)
 MD verbal order, okay for patient to transport to 4W with nurse tech.

## 2023-09-10 DIAGNOSIS — U071 COVID-19: Secondary | ICD-10-CM | POA: Diagnosis not present

## 2023-09-10 LAB — CBC
HCT: 36.2 % (ref 36.0–46.0)
Hemoglobin: 11.1 g/dL — ABNORMAL LOW (ref 12.0–15.0)
MCH: 29.6 pg (ref 26.0–34.0)
MCHC: 30.7 g/dL (ref 30.0–36.0)
MCV: 96.5 fL (ref 80.0–100.0)
Platelets: 161 10*3/uL (ref 150–400)
RBC: 3.75 MIL/uL — ABNORMAL LOW (ref 3.87–5.11)
RDW: 13.3 % (ref 11.5–15.5)
WBC: 4 10*3/uL (ref 4.0–10.5)
nRBC: 0 % (ref 0.0–0.2)

## 2023-09-10 LAB — CULTURE, BLOOD (ROUTINE X 2)
Culture: NO GROWTH
Culture: NO GROWTH

## 2023-09-10 LAB — COMPREHENSIVE METABOLIC PANEL
ALT: 9 U/L (ref 0–44)
AST: 24 U/L (ref 15–41)
Albumin: 3 g/dL — ABNORMAL LOW (ref 3.5–5.0)
Alkaline Phosphatase: 29 U/L — ABNORMAL LOW (ref 38–126)
Anion gap: 11 (ref 5–15)
BUN: 20 mg/dL (ref 8–23)
CO2: 21 mmol/L — ABNORMAL LOW (ref 22–32)
Calcium: 9 mg/dL (ref 8.9–10.3)
Chloride: 106 mmol/L (ref 98–111)
Creatinine, Ser: 0.81 mg/dL (ref 0.44–1.00)
GFR, Estimated: >60 mL/min (ref >60)
Glucose, Bld: 97 mg/dL (ref 70–99)
Potassium: 4.4 mmol/L (ref 3.5–5.1)
Sodium: 138 mmol/L (ref 135–145)
Total Bilirubin: 0.7 mg/dL (ref 0.0–1.2)
Total Protein: 6.4 g/dL — ABNORMAL LOW (ref 6.5–8.1)

## 2023-09-10 MED ORDER — LORATADINE 10 MG PO TABS
10.0000 mg | ORAL_TABLET | Freq: Every day | ORAL | Status: DC
  Administered 2023-09-10 – 2023-09-11 (×2): 10 mg via ORAL
  Filled 2023-09-10 (×2): qty 1

## 2023-09-10 MED ORDER — FLUTICASONE PROPIONATE 50 MCG/ACT NA SUSP
2.0000 | Freq: Every day | NASAL | Status: DC
  Administered 2023-09-10 – 2023-09-11 (×2): 2 via NASAL
  Filled 2023-09-10: qty 16

## 2023-09-10 NOTE — Progress Notes (Signed)
 PROGRESS NOTE LASHAWN BROMWELL  VWU:[id number] DOB: August 22, 1940 DOA: 09/05/2023 PCP: Teodoro Spray, MD  Brief Narrative/Hospital Course: 7 yof w/ HTN, chronic diastolic CHF, s/p PPM, COPD, chronic respiratory failure with hypoxia on 3 L [place],CKD 3A,  HX of SBO, Crohn's disease, chronic back pain, anxiety, and anemia presented with altered mental status generalized weakness cough and fever.  Recently admit 1/22-1/26 for SBO w/ acute metabolic encephalopathy improved with conservative management. In the ED: Fever up to 102.7, tachycardic initial oxygen 94%.Labs-positive COVID,Normal lactate and normal white blood cell count.UA with 11-20 white cells.CXR-without any infiltrate.CT head without any intracranial abnormality.  Patient subsequent PRN nonrebreather and BIPAP and admitted to stepdown unit.  Initially needing BiPAP and nonrebreather, managed with remdesivir aggressive bronchodilators. Overall clinically improving wean down to HFNC.  Patient reports using 4 L nasal cannula mostly at home.  Overall patient is clinically improving.  Doing well with PT OT and ambulation.  Transferred out of SDU 2/20.   Subjective: Patient seen and examined Complains of nasal congestion and stuffiness Still not feeling at baseline Having weakness She is down to 4 L nasal cannula home setting  Assessment and Plan: Principal Problem:   COVID-19 virus infection Active Problems:   Sepsis without acute organ dysfunction (HCC)   Acute on chronic hypoxic respiratory failure (HCC)   Dehydration   Hypotension due to hypovolemia   COVID   Sepsis POA COVID-19 infection: Patient presented with fever, hypotension tachycardia with pulmonary infiltrates positive for COVID suggestive of sepsis on presentation with increased oxygen demand. PTA using 4l [place] mostly.CXR without any infiltrate. S/p Remdesivir.  No longer on BiPAP/NRB Patient is slowly improving complaining of nasal congestion, add Claritin and Flonase.   Respiratory status much better , on 4L [place] Cont Yupelri/Brovana/Pulmicort and nebs pulmonary toileting with incentive spirometry PT OT about to work with her.    Acute on chronic hypoxic respiratory failure Acute COPD exacerbation: Patient had worsening hypoxia from baseline -multifactorial  from COPD exacerbation, COVID infection, sepsis on baseline  4 L [place].  Weaned oxygen to home setting and stable. Continue aggressive pulmonary toileting inhalers as able.  Unfortunately unable to tolerate steroid due to hallucination and agitation AND she reported that they had to stop steroid in the past   Hypertension urgency Chronic CHF with preserved EF Hypotensive initially: BP controlled continue losartan.   Crohn's disease Recent SBO in January GERD: Continue PPI, Tylenol #3/Imodium.Followed by GI at [facility].  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  Continue to monitor   AKI on CKD 3a Baseline creatinine around  1.0.  1.4 on admit and improved to 0.8.Monitor intermittently  HLD: Continue high-fiber/Zetia.  Anxiety disorder Chronic back pain Neuropathy: Mood stable, continue home home  PRN Xanax, Lyrica, pain management  DVT prophylaxis: enoxaparin (LOVENOX) injection 40 mg Start: 09/06/23 2200 Code Status:   Code Status: Full Code Family Communication: plan of care discussed with patient at bedside. Patient status is: Remains hospitalized because of severity of illness Level of care: Telemetry   Dispo: The patient is from: home            Anticipated disposition: Anticipate discharge home in next 24 hours   Objective: Vitals last 24 hrs: Vitals:   09/10/23 0434 09/10/23 0754 09/10/23 0758 09/10/23 1100  BP:    124/83  Pulse:    66  Resp:    16  Temp:  97.7 F (36.5 C)  TempSrc:    Oral  SpO2:  97% 97% 97%  Weight: 72.2 kg     Height:       Weight change: 0.4 kg  Physical  Examination: General exam: alert awake, oriented at baseline, older than stated age HEENT:Oral mucosa moist, Ear/Nose WNL grossly Respiratory system: Bilaterally diminished without wheezing,no use of accessory muscle Cardiovascular system: S1 & S2 +, No JVD. Gastrointestinal system: Abdomen soft,NT,ND, BS+ Nervous System: Alert, awake, moving all extremities,and following commands. Extremities: LE edema neg,distal peripheral pulses palpable and warm.  Skin: No rashes,no icterus. MSK: Normal muscle bulk,tone, power    Medications reviewed:  Scheduled Meds:  arformoterol  15 mcg Nebulization BID   budesonide (PULMICORT) nebulizer solution  0.25 mg Nebulization BID   Chlorhexidine Gluconate Cloth  6 each Topical Daily   enoxaparin (LOVENOX) injection  40 mg Subcutaneous Q24H   ezetimibe  10 mg Oral QHS   fenofibrate  160 mg Oral Daily   fluticasone  2 spray Each Nare Daily   loratadine  10 mg Oral Daily   losartan  25 mg Oral Daily   pantoprazole  40 mg Oral Daily   potassium chloride SA  20 mEq Oral Daily   pregabalin  50 mg Oral BID   revefenacin  175 mcg Nebulization Daily   Continuous Infusions:    Diet Order             Diet Heart Room service appropriate? Yes; Fluid consistency: Thin  Diet effective now                  No intake or output data in the 24 hours ending 09/10/23 1149  Net IO Since Admission: 671.08 mL [09/10/23 1149]  Wt Readings from Last 3 Encounters:  09/10/23 72.2 kg  08/11/23 68 kg  10/21/22 68.9 kg     Unresulted Labs (From admission, onward)     Start     Ordered   09/08/23 0500  CBC  Daily,   R     Question:  Specimen collection method  Answer:  Lab=Lab collect   09/07/23 1154   09/08/23 0500  Comprehensive metabolic panel  Daily,   R     Question:  Specimen collection method  Answer:  Lab=Lab collect   09/07/23 1154          Data Reviewed: I have personally reviewed following labs and imaging studies CBC: Recent Labs  Lab  09/05/23 1755 09/06/23 0616 09/07/23 0317 09/08/23 0315 09/09/23 0308 09/10/23 0406  WBC 9.5 10.5 4.4 3.5* 2.8* 4.0  NEUTROABS 8.4*  --   --   --   --   --   HGB 11.5* 13.3 10.9* 11.7* 11.8* 11.1*  HCT 36.6 42.7 35.3* 38.7 37.7 36.2  MCV 94.8 97.5 97.2 97.0 95.9 96.5  PLT 174 210 143* 166 149* 161   Basic Metabolic Panel:  Recent Labs  Lab 09/06/23 0616 09/07/23 0317 09/08/23 0315 09/09/23 0308 09/10/23 0406  NA 137 141 138 139 138  K 4.7 4.3 4.4 4.3 4.4  CL 105 108 109 108 106  CO2 18* 21* 22 23 21*  GLUCOSE 157* 100* 120* 101* 97  BUN 24* 23 19 16 20   CREATININE 1.19* 0.94 0.90 0.83 0.81  CALCIUM 8.2* 8.8* 8.5* 8.4* 9.0  MG 1.8 2.1  --   --   --   PHOS 3.9  --   --   --   --  GFR: Estimated Creatinine Clearance: 53.1 mL/min (by C-G formula based on SCr of 0.81 mg/dL). Liver Function Tests:  Recent Labs  Lab 09/05/23 1755 09/08/23 0315 09/09/23 0308 09/10/23 0406  AST 33 41 30 24  ALT 11 13 11 9   ALKPHOS 32* 28* 25* 29*  BILITOT 0.6 0.7 0.7 0.7  PROT 7.1 6.2* 6.1* 6.4*  ALBUMIN 3.8 3.0* 2.9* 3.0*   Recent Labs  Lab 09/05/23 1755  LIPASE 32   No results for input(s): "AMMONIA" in the last 168 hours. No results for input(s): "CHOL", "HDL", "LDLCALC", "TRIG", "CHOLHDL", "LDLDIRECT" in the last 72 hours. No results for input(s): "TSH", "T4TOTAL", "FREET4", "T3FREE", "THYROIDAB" in the last 72 hours. Sepsis Labs: Recent Labs  Lab 09/05/23 1800 09/06/23 0626  LATICACIDVEN 1.6 2.9*   Recent Results (from the past 240 hours)  Resp panel by RT-PCR (RSV, Flu A&B, Covid) Urine, Clean Catch     Status: Abnormal   Collection Time: 09/05/23  5:54 PM   Specimen: Urine, Clean Catch; Nasal Swab  Result Value Ref Range Status   SARS Coronavirus 2 by RT PCR POSITIVE (A) NEGATIVE Final    Comment: (NOTE) SARS-CoV-2 target nucleic acids are DETECTED.  The SARS-CoV-2 RNA is generally detectable in upper respiratory specimens during the acute phase of infection.  Positive results are indicative of the presence of the identified virus, but do not rule out bacterial infection or co-infection with other pathogens not detected by the test. Clinical correlation with patient history and other diagnostic information is necessary to determine patient infection status. The expected result is Negative.  Fact Sheet for Patients: BloggerCourse.com  Fact Sheet for Healthcare Providers: SeriousBroker.it  This test is not yet approved or cleared by the [place] FDA and  has been authorized for detection and/or diagnosis of SARS-CoV-2 by FDA under an Emergency Use Authorization (EUA).  This EUA will remain in effect (meaning this test can be used) for the duration of  the COVID-19 declaration under Section 564(b)(1) of the A ct, 21 U.S.C. section 360bbb-3(b)(1), unless the authorization is terminated or revoked sooner.     Influenza A by PCR NEGATIVE NEGATIVE Final   Influenza B by PCR NEGATIVE NEGATIVE Final    Comment: (NOTE) The Xpert Xpress SARS-CoV-2/FLU/RSV plus assay is intended as an aid in the diagnosis of influenza from Nasopharyngeal swab specimens and should not be used as a sole basis for treatment. Nasal washings and aspirates are unacceptable for Xpert Xpress SARS-CoV-2/FLU/RSV testing.  Fact Sheet for Patients: BloggerCourse.com  Fact Sheet for Healthcare Providers: SeriousBroker.it  This test is not yet approved or cleared by the [place] FDA and has been authorized for detection and/or diagnosis of SARS-CoV-2 by FDA under an Emergency Use Authorization (EUA). This EUA will remain in effect (meaning this test can be used) for the duration of the COVID-19 declaration under Section 564(b)(1) of the Act, 21 U.S.C. section 360bbb-3(b)(1), unless the authorization is terminated or revoked.     Resp Syncytial Virus by PCR  NEGATIVE NEGATIVE Final    Comment: (NOTE) Fact Sheet for Patients: BloggerCourse.com  Fact Sheet for Healthcare Providers: SeriousBroker.it  This test is not yet approved or cleared by the [place] FDA and has been authorized for detection and/or diagnosis of SARS-CoV-2 by FDA under an Emergency Use Authorization (EUA). This EUA will remain in effect (meaning this test can be used) for the duration of the COVID-19 declaration under Section 564(b)(1) of the Act, 21 U.S.C. section 360bbb-3(b)(1), unless the authorization  is terminated or revoked.  Performed at [facility], [address] [place], [place] [address]   Urine Culture     Status: Abnormal   Collection Time: 09/05/23  5:57 PM   Specimen: Urine, Clean Catch  Result Value Ref Range Status   Specimen Description   Final    URINE, CLEAN CATCH Performed at [facility], [address], [place], [place] [address]    Special Requests   Final    NONE Performed at [facility], [address], [place], [place] [address]    Culture MULTIPLE SPECIES PRESENT, SUGGEST RECOLLECTION (A)  Final   Report Status 09/06/2023 FINAL  Final  Culture, blood (Routine X 2) w Reflex to ID Panel     Status: None   Collection Time: 09/05/23  6:05 PM   Specimen: BLOOD  Result Value Ref Range Status   Specimen Description   Final    BLOOD LEFT ANTECUBITAL Performed at [facility], [address], [place], [place] [address]    Special Requests   Final    BOTTLES DRAWN AEROBIC AND ANAEROBIC Blood Culture results may not be optimal due to an inadequate volume of blood received in culture bottles Performed at [facility], [address] [place], [place] [address]    Culture   Final    NO GROWTH 5 DAYS Performed at [facility], [address], [place], [place] [address]    Report  Status 09/10/2023 FINAL  Final  Culture, blood (Routine X 2) w Reflex to ID Panel     Status: None   Collection Time: 09/05/23  6:42 PM   Specimen: BLOOD  Result Value Ref Range Status   Specimen Description   Final    BLOOD RIGHT ANTECUBITAL Performed at [facility], [address] [place], [place] [address]    Special Requests   Final    BOTTLES DRAWN AEROBIC AND ANAEROBIC Blood Culture results may not be optimal due to an inadequate volume of blood received in culture bottles Performed at [facility], [address] [place], [place] [address]    Culture   Final    NO GROWTH 5 DAYS Performed at [facility], [address], [place], [place] [address]    Report Status 09/10/2023 FINAL  Final    Antimicrobials/Microbiology: Anti-infectives (From admission, onward)    Start     Dose/Rate Route Frequency Ordered Stop   09/06/23 1000  remdesivir 100 mg in sodium chloride 0.9 % 100 mL IVPB       Placed in "Followed by" Linked Group   100 mg 200 mL/hr over 30 Minutes Intravenous Daily 09/05/23 2308 09/07/23 1429   09/05/23 2330  remdesivir 200 mg in sodium chloride 0.9% 250 mL IVPB       Placed in "Followed by" Linked Group   200 mg 580 mL/hr over 30 Minutes Intravenous Once 09/05/23 2308 09/06/23 0019         Component Value Date/Time   SDES  09/05/2023 1842    BLOOD RIGHT ANTECUBITAL Performed at [facility], [address] [place], [place] [address]    SPECREQUEST  09/05/2023 1842    BOTTLES DRAWN AEROBIC AND ANAEROBIC Blood Culture results may not be optimal due to an inadequate volume of blood received in culture bottles Performed at [facility], [address], [place], [place] [address]    CULT  09/05/2023 1842    NO GROWTH 5 DAYS Performed at [facility], [address] [place], [place] [address]    REPTSTATUS 09/10/2023 FINAL 09/05/2023 1842     Radiology  Studies: No results found.    LOS: 4 days   Total time spent in review of labs and imaging, patient evaluation, formulation of plan, documentation and communication with family:  35 minutes  Lanae Boast, MD  [facility]  09/10/2023, 11:49 AM

## 2023-09-10 NOTE — Progress Notes (Signed)
 Physical Therapy Treatment Patient Details Name: Brittany Irwin MRN: [id number] DOB: 1941-04-18 Today's Date: 09/10/2023   History of Present Illness Pt is 83 yo female admitted on 09/05/23 with sepsis and resp failure due to COVID and COPD exacerbation. Pt with hx including but not limited to HTN, chronic diastolic CHF, s/p PPM, COPD, chronic respiratory failure with hypoxia on 3 L [place],CKD 3A,  HX of SBO, Crohn's disease, chronic back pain, anxiety, and anemia    PT Comments  PT - Cognition Comments: AxO x 3 "feeling better" c/o being "tired" and sometimes "foggy brain" but over all improving "slowly". Assisted to [facility] then amb around room.  General transfer comment: self able OOB to [facility] then back to bed.  Good safety cognition and use of hands to steady self. General Gait Details: amb several laps around room with walker on 4 lts oxygen with avg sats 90%.  Max c/o "feeling tired".  Minimal lean/use on walker.  Mild c/o B LE weakness from "not moving". Assisted back to bed to rest.  "Tired".  Pt lives home alone and plans to return home with some family help. Pt progressing.     If plan is discharge home, recommend the following: Assistance with cooking/housework;Help with stairs or ramp for entrance   Can travel by private vehicle        Equipment Recommendations  None recommended by PT    Recommendations for Other Services       Precautions / Restrictions Precautions Precautions: Fall Precaution/Restrictions Comments: droplet/ home oxygen 3-4 lts Restrictions Weight Bearing Restrictions Per Provider Order: No     Mobility  Bed Mobility   Bed Mobility: Supine to Sit, Sit to Supine     Supine to sit: Supervision Sit to supine: Supervision   General bed mobility comments: self able with increased time    Transfers Overall transfer level: Needs assistance Equipment used: Rolling walker (2 wheels) Transfers: Sit to/from Stand Sit to Stand: Supervision            General transfer comment: self able OOB to [facility] then back to bed.  Good safety cognition and use of hands to steady self.    Ambulation/Gait Ambulation/Gait assistance: Supervision Gait Distance (Feet): 44 Feet Assistive device: Rolling walker (2 wheels) Gait Pattern/deviations: Step-to pattern, Decreased stride length Gait velocity: decreased     General Gait Details: amb several laps around room with walker on 4 lts oxygen with avg sats 90%.  Max c/o "feeling tired".  Minimal lean/use on walker.  Mild c/o B LE weakness from "not moving".   Stairs             Wheelchair Mobility     Tilt Bed    Modified Rankin (Stroke Patients Only)       Balance                                            Communication Communication Communication: No apparent difficulties  Cognition Arousal: Alert Behavior During Therapy: WFL for tasks assessed/performed   PT - Cognitive impairments: No apparent impairments                       PT - Cognition Comments: AxO x 3 "feeling better" c/o being "tired" and sometimes "foggy brain" but over all improving "slowly". Following commands: Intact      Cueing Cueing  Techniques: Verbal cues  Exercises      General Comments        Pertinent Vitals/Pain Pain Assessment Pain Assessment: No/denies pain    Home Living                          Prior Function            PT Goals (current goals can now be found in the care plan section) Progress towards PT goals: Progressing toward goals    Frequency    Min 1X/week      PT Plan      Co-evaluation              AM-PAC PT "6 Clicks" Mobility   Outcome Measure  Help needed turning from your back to your side while in a flat bed without using bedrails?: None Help needed moving from lying on your back to sitting on the side of a flat bed without using bedrails?: None Help needed moving to and from a bed to a chair (including a  wheelchair)?: None Help needed standing up from a chair using your arms (e.g., wheelchair or bedside chair)?: None Help needed to walk in hospital room?: A Little Help needed climbing 3-5 steps with a railing? : A Little 6 Click Score: 22    End of Session Equipment Utilized During Treatment: Gait belt Activity Tolerance: Patient limited by fatigue Patient left: in bed;with call bell/phone within reach Nurse Communication: Mobility status PT Visit Diagnosis: Other abnormalities of gait and mobility (R26.89);Muscle weakness (generalized) (M62.81)     Time: 1106-1130 PT Time Calculation (min) (ACUTE ONLY): 24 min  Charges:    $Gait Training: 8-22 mins $Therapeutic Activity: 8-22 mins PT General Charges $$ ACUTE PT VISIT: 1 Visit                    Felecia Shelling  PTA Acute  Rehabilitation Services Office M-F          [phone]

## 2023-09-10 NOTE — Plan of Care (Signed)

## 2023-09-11 ENCOUNTER — Other Ambulatory Visit (HOSPITAL_COMMUNITY): Payer: Self-pay

## 2023-09-11 DIAGNOSIS — U071 COVID-19: Secondary | ICD-10-CM | POA: Diagnosis not present

## 2023-09-11 LAB — CBC
HCT: 39.8 % (ref 36.0–46.0)
Hemoglobin: 12 g/dL (ref 12.0–15.0)
MCH: 29.2 pg (ref 26.0–34.0)
MCHC: 30.2 g/dL (ref 30.0–36.0)
MCV: 96.8 fL (ref 80.0–100.0)
Platelets: 203 10*3/uL (ref 150–400)
RBC: 4.11 MIL/uL (ref 3.87–5.11)
RDW: 13.2 % (ref 11.5–15.5)
WBC: 3.6 10*3/uL — ABNORMAL LOW (ref 4.0–10.5)
nRBC: 0 % (ref 0.0–0.2)

## 2023-09-11 LAB — COMPREHENSIVE METABOLIC PANEL
ALT: 11 U/L (ref 0–44)
AST: 27 U/L (ref 15–41)
Albumin: 3.3 g/dL — ABNORMAL LOW (ref 3.5–5.0)
Alkaline Phosphatase: 32 U/L — ABNORMAL LOW (ref 38–126)
Anion gap: 10 (ref 5–15)
BUN: 19 mg/dL (ref 8–23)
CO2: 24 mmol/L (ref 22–32)
Calcium: 9.4 mg/dL (ref 8.9–10.3)
Chloride: 105 mmol/L (ref 98–111)
Creatinine, Ser: 0.86 mg/dL (ref 0.44–1.00)
GFR, Estimated: >60 mL/min (ref >60)
Glucose, Bld: 97 mg/dL (ref 70–99)
Potassium: 4.5 mmol/L (ref 3.5–5.1)
Sodium: 139 mmol/L (ref 135–145)
Total Bilirubin: 0.6 mg/dL (ref 0.0–1.2)
Total Protein: 7 g/dL (ref 6.5–8.1)

## 2023-09-11 MED ORDER — LORATADINE 10 MG PO TABS
10.0000 mg | ORAL_TABLET | Freq: Every day | ORAL | 0 refills | Status: DC
  Filled 2023-09-11: qty 14, 14d supply, fill #0

## 2023-09-11 MED ORDER — LEVALBUTEROL HCL 0.63 MG/3ML IN NEBU
0.6300 mg | INHALATION_SOLUTION | RESPIRATORY_TRACT | 0 refills | Status: DC | PRN
  Filled 2023-09-11: qty 3, 1d supply, fill #0

## 2023-09-11 MED ORDER — GUAIFENESIN-DM 100-10 MG/5ML PO SYRP
10.0000 mL | ORAL_SOLUTION | ORAL | 0 refills | Status: DC | PRN
  Filled 2023-09-11 (×2): qty 118, 2d supply, fill #0

## 2023-09-11 NOTE — Discharge Summary (Signed)
 Physician Discharge Summary  Brittany Irwin ZOX:[id number] DOB: 1941/07/19 DOA: 09/05/2023  PCP: Teodoro Spray, MD  Admit date: 09/05/2023 Discharge date: 09/11/2023 Recommendations for Outpatient Follow-up:  Follow up with PCP in 1 weeks-call for appointment Please obtain BMP/CBC in one week  Discharge Dispo: Home Discharge Condition: Stable Code Status:   Code Status: Full Code Diet recommendation:  Diet Order             Diet Heart Room service appropriate? Yes; Fluid consistency: Thin  Diet effective now                    Brief/Interim Summary: 57 yof w/ HTN, chronic diastolic CHF, s/p PPM, COPD, chronic respiratory failure with hypoxia on 3 L [place],CKD 3A,  HX of SBO, Crohn's disease, chronic back pain, anxiety, and anemia presented with altered mental status generalized weakness cough and fever.  Recently admit 1/22-1/26 for SBO w/ acute metabolic encephalopathy improved with conservative management. In the ED: Fever up to 102.7, tachycardic initial oxygen 94%.Labs-positive COVID,Normal lactate and normal white blood cell count.UA with 11-20 white cells.CXR-without any infiltrate.CT head without any intracranial abnormality.  Patient subsequent PRN nonrebreather and BIPAP and admitted to stepdown unit.  Initially needing BiPAP and nonrebreather, managed with remdesivir aggressive bronchodilators. Overall clinically improving wean down to HFNC.  Patient reports using 4 L nasal cannula mostly at home.  Overall patient is clinically improving.  Doing well with PT OT and ambulation.  Transferred out of SDU 2/20. Patient slowly continues to improve does have deconditioning debility, seen by PT OT and no further PT OT needs.  Oxygen need at baseline home setting of 4 L and hemodynamically stable She feels well and ready for discharge to home today.   Discharge Diagnoses:  Principal Problem:   COVID-19 virus infection Active Problems:   Sepsis without acute organ dysfunction  (HCC)   Acute on chronic hypoxic respiratory failure (HCC)   Dehydration   Hypotension due to hypovolemia   COVID  Assessment and Plan: No notes have been filed under this hospital service. Service: Hospitalist  Sepsis POA COVID-19 infection: Patient presented with fever, hypotension tachycardia with pulmonary infiltrates positive for COVID suggestive of sepsis on presentation with increased oxygen demand. PTA using 4l [place] mostly.CXR without any infiltrate. S/p Remdesivir.  No longer on BiPAP/NRB.  Overall she is clinically stable and improved doing well on 4 L nasal cannula She will continue supportive measures saline spray/Flonase, Claritin   Acute on chronic hypoxic respiratory failure Acute COPD exacerbation: Patient had worsening hypoxia from baseline -multifactorial  from COPD exacerbation, COVID infection, sepsis on baseline  4 L [place]. Weaned oxygen to home setting and stable.Unfortunately unable to tolerate steroid due to hallucination and agitation AND she reported that they had to stop steroid in the past    Hypertension urgency Chronic CHF with preserved EF Hypotensive initially: BP currently well-controlled continue current losartan resume bumex.     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  Continue to monitor   AKI on CKD 3a Baseline creatinine around  1.0.  1.4 on admit and improved   HLD: Continue high-fiber/Zetia.   Anxiety disorder Chronic back pain Neuropathy: Mood stable, continue home home  PRN Xanax, Lyrica, pain management  Consults: pccm Subjective: aaox3  Discharge Exam: Vitals:   09/11/23 0518 09/11/23 0859  BP: 101/68   Pulse: Marland Kitchen)  59   Resp: 16 18  Temp: (!) 97.5 F (36.4 C)   SpO2: 99% 98%   General: Pt is alert, awake, not in acute distress Cardiovascular: RRR, S1/S2 +, no rubs, no gallops Respiratory: CTA bilaterally, no  wheezing, no rhonchi Abdominal: Soft, NT, ND, bowel sounds + Extremities: no edema, no cyanosis  Discharge Instructions  Discharge Instructions     Discharge instructions   Complete by: As directed    Please call call MD or return to ER for similar or worsening recurring problem that brought you to hospital or if any fever,nausea/vomiting,abdominal pain, uncontrolled pain, chest pain,  shortness of breath or any other alarming symptoms.  Please follow-up your doctor as instructed in a week time and call the office for appointment.  Please avoid alcohol, smoking, or any other illicit substance and maintain healthy habits including taking your regular medications as prescribed.  You were cared for by a hospitalist during your hospital stay. If you have any questions about your discharge medications or the care you received while you were in the hospital after you are discharged, you can call the unit and ask to speak with the hospitalist on call if the hospitalist that took care of you is not available.  Once you are discharged, your primary care physician will handle any further medical issues. Please note that NO REFILLS for any discharge medications will be authorized once you are discharged, as it is imperative that you return to your primary care physician (or establish a relationship with a primary care physician if you do not have one) for your aftercare needs so that they can reassess your need for medications and monitor your lab values   Increase activity slowly   Complete by: As directed       Allergies as of 09/11/2023       Reactions   Methylprednisolone Other (See Comments)   Increased BP, HR, agitation, hallucinations    Shellfish Allergy Nausea And Vomiting   All SEAFOOD   Ativan [lorazepam]    Psychosis   Azulfidine [sulfasalazine] Other (See Comments)   Headache    Epipen [epinephrine] Hypertension   Flagyl [metronidazole] Hives   Motrin [ibuprofen] Nausea Only,  Other (See Comments)   Told not to take medication due to GI distress caused by crohn's disease.   Nsaids Other (See Comments)   Told not to take NSAIDs due to GI distress caused by crohn's disease   Penicillins Hives   Morphine And Codeine Itching   Told not to take due to GI distress caused by crohn's disease        Medication List     STOP taking these medications    levalbuterol 45 MCG/ACT inhaler Commonly known as: XOPENEX HFA       TAKE these medications    acetaminophen-codeine 300-60 MG tablet Commonly known as: TYLENOL #4 Take 1 tablet by mouth 4 (four) times daily as needed for pain.   ALPRAZolam 1 MG tablet Commonly known as: XANAX Take 1 mg by mouth 3 (three) times daily as needed for anxiety.   atenolol 25 MG tablet Commonly known as: Tenormin Take half tablet (12.5 mg) by mouth daily. DO NOT take if heart rate is less than 60 BPM What changed:  how much to take how to take this when to take this additional instructions   AYR SALINE NASAL GEL NA Place 1 Application into the nose 2 (two) times daily as needed (Dry nose).   bumetanide 1 MG  tablet Commonly known as: BUMEX Take 1 tablet (1 mg total) by mouth daily as needed. What changed: when to take this   cyanocobalamin 1000 MCG/ML injection Commonly known as: VITAMIN B12 Inject 1,000 mcg into the muscle every 30 (thirty) days.   cyclobenzaprine 5 MG tablet Commonly known as: FLEXERIL Take 5 mg by mouth 2 (two) times daily as needed for muscle spasms.   ergocalciferol 1.25 MG (50000 UT) capsule Commonly known as: VITAMIN D2 Take 50,000 Units by mouth every 14 (fourteen) days. Every other Friday   ezetimibe 10 MG tablet Commonly known as: ZETIA Take 10 mg by mouth at bedtime.   fenofibrate micronized 200 MG capsule Commonly known as: LOFIBRA Take 200 mg by mouth daily.   guaiFENesin-dextromethorphan 100-10 MG/5ML syrup Commonly known as: ROBITUSSIN DM Take 10 mLs by mouth every 4  (four) hours as needed for cough.   ICY HOT BACK EX Apply 1 Application topically 2 (two) times daily as needed (back pain).   levalbuterol 0.63 MG/3ML nebulizer solution Commonly known as: XOPENEX Take 3 mLs (0.63 mg total) by nebulization every 4 (four) hours as needed for wheezing or shortness of breath.   loratadine 10 MG tablet Commonly known as: CLARITIN Take 1 tablet (10 mg total) by mouth daily for 14 days. Start taking on: September 12, 2023   losartan 25 MG tablet Commonly known as: COZAAR Take 0.5 tablets (12.5 mg total) by mouth daily. What changed: how much to take   melatonin 3 MG Tabs tablet Take 1 tablet (3 mg total) by mouth at bedtime. What changed:  when to take this reasons to take this   nitroGLYCERIN 0.4 MG SL tablet Commonly known as: NITROSTAT Place 0.4 mg under the tongue every 5 (five) minutes x 3 doses as needed for chest pain.   omeprazole 40 MG capsule Commonly known as: PRILOSEC Take 40 mg by mouth daily.   potassium chloride SA 20 MEQ tablet Commonly known as: KLOR-CON M Take 20 mEq by mouth daily.   pregabalin 50 MG capsule Commonly known as: LYRICA Take 50 mg by mouth 2 (two) times daily.   Trelegy Ellipta 200-62.5-25 MCG/ACT Aepb Generic drug: Fluticasone-Umeclidin-Vilant Inhale 1 puff into the lungs daily.   Voltaren Arthritis Pain 1 % Gel Generic drug: diclofenac Sodium Apply 1 Application topically 2 (two) times daily as needed (back pain).        Follow-up Information     Teodoro Spray, MD Follow up in 1 week(s).   Specialty: Internal Medicine Contact information: [address] [place] [address] [phone]                Allergies  Allergen Reactions   Methylprednisolone Other (See Comments)    Increased BP, HR, agitation, hallucinations    Shellfish Allergy Nausea And Vomiting    All SEAFOOD   Ativan [Lorazepam]     Psychosis   Azulfidine [Sulfasalazine] Other (See Comments)    Headache     Epipen [Epinephrine] Hypertension   Flagyl [Metronidazole] Hives   Motrin [Ibuprofen] Nausea Only and Other (See Comments)    Told not to take medication due to GI distress caused by crohn's disease.   Nsaids Other (See Comments)    Told not to take NSAIDs due to GI distress caused by crohn's disease   Penicillins Hives   Morphine And Codeine Itching    Told not to take due to GI distress caused by crohn's disease    The results of significant diagnostics  from this hospitalization (including imaging, microbiology, ancillary and laboratory) are listed below for reference.    Microbiology: Recent Results (from the past 240 hours)  Resp panel by RT-PCR (RSV, Flu A&B, Covid) Urine, Clean Catch     Status: Abnormal   Collection Time: 09/05/23  5:54 PM   Specimen: Urine, Clean Catch; Nasal Swab  Result Value Ref Range Status   SARS Coronavirus 2 by RT PCR POSITIVE (A) NEGATIVE Final    Comment: (NOTE) SARS-CoV-2 target nucleic acids are DETECTED.  The SARS-CoV-2 RNA is generally detectable in upper respiratory specimens during the acute phase of infection. Positive results are indicative of the presence of the identified virus, but do not rule out bacterial infection or co-infection with other pathogens not detected by the test. Clinical correlation with patient history and other diagnostic information is necessary to determine patient infection status. The expected result is Negative.  Fact Sheet for Patients: BloggerCourse.com  Fact Sheet for Healthcare Providers: SeriousBroker.it  This test is not yet approved or cleared by the [place] FDA and  has been authorized for detection and/or diagnosis of SARS-CoV-2 by FDA under an Emergency Use Authorization (EUA).  This EUA will remain in effect (meaning this test can be used) for the duration of  the COVID-19 declaration under Section 564(b)(1) of the A ct, 21 U.S.C. section  360bbb-3(b)(1), unless the authorization is terminated or revoked sooner.     Influenza A by PCR NEGATIVE NEGATIVE Final   Influenza B by PCR NEGATIVE NEGATIVE Final    Comment: (NOTE) The Xpert Xpress SARS-CoV-2/FLU/RSV plus assay is intended as an aid in the diagnosis of influenza from Nasopharyngeal swab specimens and should not be used as a sole basis for treatment. Nasal washings and aspirates are unacceptable for Xpert Xpress SARS-CoV-2/FLU/RSV testing.  Fact Sheet for Patients: BloggerCourse.com  Fact Sheet for Healthcare Providers: SeriousBroker.it  This test is not yet approved or cleared by the [place] FDA and has been authorized for detection and/or diagnosis of SARS-CoV-2 by FDA under an Emergency Use Authorization (EUA). This EUA will remain in effect (meaning this test can be used) for the duration of the COVID-19 declaration under Section 564(b)(1) of the Act, 21 U.S.C. section 360bbb-3(b)(1), unless the authorization is terminated or revoked.     Resp Syncytial Virus by PCR NEGATIVE NEGATIVE Final    Comment: (NOTE) Fact Sheet for Patients: BloggerCourse.com  Fact Sheet for Healthcare Providers: SeriousBroker.it  This test is not yet approved or cleared by the [place] FDA and has been authorized for detection and/or diagnosis of SARS-CoV-2 by FDA under an Emergency Use Authorization (EUA). This EUA will remain in effect (meaning this test can be used) for the duration of the COVID-19 declaration under Section 564(b)(1) of the Act, 21 U.S.C. section 360bbb-3(b)(1), unless the authorization is terminated or revoked.  Performed at [facility], [address], [place], [place] [address]   Urine Culture     Status: Abnormal   Collection Time: 09/05/23  5:57 PM   Specimen: Urine, Clean Catch  Result Value Ref Range Status    Specimen Description   Final    URINE, CLEAN CATCH Performed at [facility], [address] [place], [place] [address]    Special Requests   Final    NONE Performed at [facility], [address] [place], [place] [address]    Culture MULTIPLE SPECIES PRESENT, SUGGEST RECOLLECTION (A)  Final   Report Status 09/06/2023 FINAL  Final  Culture, blood (Routine X 2) w Reflex to ID Panel     Status: None   Collection Time: 09/05/23  6:05 PM   Specimen: BLOOD  Result Value Ref Range Status   Specimen Description   Final    BLOOD LEFT ANTECUBITAL Performed at [facility], [address] [place], [place] [address]    Special Requests   Final    BOTTLES DRAWN AEROBIC AND ANAEROBIC Blood Culture results may not be optimal due to an inadequate volume of blood received in culture bottles Performed at [facility], [address], [place], [place] [address]    Culture   Final    NO GROWTH 5 DAYS Performed at [facility], [address] [place], [place] [address]    Report Status 09/10/2023 FINAL  Final  Culture, blood (Routine X 2) w Reflex to ID Panel     Status: None   Collection Time: 09/05/23  6:42 PM   Specimen: BLOOD  Result Value Ref Range Status   Specimen Description   Final    BLOOD RIGHT ANTECUBITAL Performed at [facility], [address], [place], [place] [address]    Special Requests   Final    BOTTLES DRAWN AEROBIC AND ANAEROBIC Blood Culture results may not be optimal due to an inadequate volume of blood received in culture bottles Performed at [facility], [address], [place], [place] [address]    Culture   Final    NO GROWTH 5 DAYS Performed at [facility], [address] [place], [place] [address]    Report Status 09/10/2023 FINAL  Final    Procedures/Studies: DG Chest Port 1 View Result Date: 09/05/2023 CLINICAL DATA:  fever  EXAM: PORTABLE CHEST 1 VIEW COMPARISON:  August 11, 2023 FINDINGS: Evaluation is limited by rotation. The cardiomediastinal silhouette is unchanged and enlarged in contour with similar prominence of the RIGHT pericardial border.LEFT chest cardiac pacing device. No pleural effusion. No pneumothorax. No acute pleuroparenchymal abnormality. IMPRESSION: No acute cardiopulmonary abnormality. Electronically Signed   By: Meda Klinefelter M.D.   On: 09/05/2023 19:44   CT Head Wo Contrast Result Date: 09/05/2023 CLINICAL DATA:  Mental status change, unknown cause EXAM: CT HEAD WITHOUT CONTRAST TECHNIQUE: Contiguous axial images were obtained from the base of the skull through the vertex without intravenous contrast. RADIATION DOSE REDUCTION: This exam was performed according to the departmental dose-optimization program which includes automated exposure control, adjustment of the mA and/or kV according to patient size and/or use of iterative reconstruction technique. COMPARISON:  None Available. FINDINGS: Brain: No evidence of acute infarction, hemorrhage, hydrocephalus, extra-axial collection or mass lesion/mass effect. Vascular: No hyperdense vessel. Skull: No acute fracture. Sinuses/Orbits: Clear sinuses.  No acute orbital findings. Other: No mastoid effusions. IMPRESSION: No evidence of acute intracranial abnormality. Electronically Signed   By: Feliberto Harts M.D.   On: 09/05/2023 19:40   DG Abd Portable 1V Result Date: 08/13/2023 CLINICAL DATA:  Small-bowel obstruction EXAM: PORTABLE ABDOMEN - 1 VIEW COMPARISON:  08/12/2023 FINDINGS: Gastric tube is been removed. Surgical staples right upper quadrant. No stenotic staples right lower quadrant. Stomach incompletely distended. A few gas distended left mid abdominal loops. The colon is nondilated. Bilateral gluteal injection granulomas. Mild lumbar spondylitic change. IMPRESSION: Nonobstructive bowel gas pattern. Electronically Signed   By: Corlis Leak M.D.    On: 08/13/2023 15:25    Labs: BNP (last 3 results) Recent Labs  10/14/22 0900  BNP 321.6*   Basic Metabolic Panel: Recent Labs  Lab 09/06/23 0616 09/07/23 0317 09/08/23 0315 09/09/23 0308 09/10/23 0406 09/11/23 0442  NA 137 141 138 139 138 139  K 4.7 4.3 4.4 4.3 4.4 4.5  CL 105 108 109 108 106 105  CO2 18* 21* 22 23 21* 24  GLUCOSE 157* 100* 120* 101* 97 97  BUN 24* 23 19 16 20 19   CREATININE 1.19* 0.94 0.90 0.83 0.81 0.86  CALCIUM 8.2* 8.8* 8.5* 8.4* 9.0 9.4  MG 1.8 2.1  --   --   --   --   PHOS 3.9  --   --   --   --   --    Liver Function Tests: Recent Labs  Lab 09/05/23 1755 09/08/23 0315 09/09/23 0308 09/10/23 0406 09/11/23 0442  AST 33 41 30 24 27   ALT 11 13 11 9 11   ALKPHOS 32* 28* 25* 29* 32*  BILITOT 0.6 0.7 0.7 0.7 0.6  PROT 7.1 6.2* 6.1* 6.4* 7.0  ALBUMIN 3.8 3.0* 2.9* 3.0* 3.3*   Recent Labs  Lab 09/05/23 1755  LIPASE 32   No results for input(s): "AMMONIA" in the last 168 hours. CBC: Recent Labs  Lab 09/05/23 1755 09/06/23 0616 09/07/23 0317 09/08/23 0315 09/09/23 0308 09/10/23 0406 09/11/23 0442  WBC 9.5   < > 4.4 3.5* 2.8* 4.0 3.6*  NEUTROABS 8.4*  --   --   --   --   --   --   HGB 11.5*   < > 10.9* 11.7* 11.8* 11.1* 12.0  HCT 36.6   < > 35.3* 38.7 37.7 36.2 39.8  MCV 94.8   < > 97.2 97.0 95.9 96.5 96.8  PLT 174   < > 143* 166 149* 161 203   < > = values in this interval not displayed.   Cardiac Enzymes: No results for input(s): "CKTOTAL", "CKMB", "CKMBINDEX", "TROPONINI" in the last 168 hours. BNP: Invalid input(s): "POCBNP" CBG: Recent Labs  Lab 09/06/23 1956  GLUCAP 94   D-Dimer No results for input(s): "DDIMER" in the last 72 hours. Hgb A1c No results for input(s): "HGBA1C" in the last 72 hours. Lipid Profile No results for input(s): "CHOL", "HDL", "LDLCALC", "TRIG", "CHOLHDL", "LDLDIRECT" in the last 72 hours. Thyroid function studies No results for input(s): "TSH", "T4TOTAL", "T3FREE", "THYROIDAB" in the last  72 hours.  Invalid input(s): "FREET3" Anemia work up No results for input(s): "VITAMINB12", "FOLATE", "FERRITIN", "TIBC", "IRON", "RETICCTPCT" in the last 72 hours. Urinalysis    Component Value Date/Time   COLORURINE YELLOW 09/05/2023 1757   APPEARANCEUR CLEAR 09/05/2023 1757   LABSPEC 1.018 09/05/2023 1757   PHURINE 7.0 09/05/2023 1757   GLUCOSEU NEGATIVE 09/05/2023 1757   HGBUR NEGATIVE 09/05/2023 1757   BILIRUBINUR NEGATIVE 09/05/2023 1757   KETONESUR NEGATIVE 09/05/2023 1757   PROTEINUR NEGATIVE 09/05/2023 1757   NITRITE NEGATIVE 09/05/2023 1757   LEUKOCYTESUR SMALL (A) 09/05/2023 1757   Sepsis Labs Recent Labs  Lab 09/08/23 0315 09/09/23 0308 09/10/23 0406 09/11/23 0442  WBC 3.5* 2.8* 4.0 3.6*   Microbiology Recent Results (from the past 240 hours)  Resp panel by RT-PCR (RSV, Flu A&B, Covid) Urine, Clean Catch     Status: Abnormal   Collection Time: 09/05/23  5:54 PM   Specimen: Urine, Clean Catch; Nasal Swab  Result Value Ref Range Status   SARS Coronavirus 2 by RT PCR POSITIVE (A) NEGATIVE Final    Comment: (NOTE) SARS-CoV-2 target nucleic acids are DETECTED.  The SARS-CoV-2 RNA is generally detectable in upper respiratory specimens during the acute phase of infection. Positive results are indicative of the presence of the identified virus, but do not rule out bacterial infection or co-infection with other pathogens not detected by the test. Clinical correlation with patient history and other diagnostic information is necessary to determine patient infection status. The expected result is Negative.  Fact Sheet for Patients: BloggerCourse.com  Fact Sheet for Healthcare Providers: SeriousBroker.it  This test is not yet approved or cleared by the [place] FDA and  has been authorized for detection and/or diagnosis of SARS-CoV-2 by FDA under an Emergency Use Authorization (EUA).  This EUA will remain in  effect (meaning this test can be used) for the duration of  the COVID-19 declaration under Section 564(b)(1) of the A ct, 21 U.S.C. section 360bbb-3(b)(1), unless the authorization is terminated or revoked sooner.     Influenza A by PCR NEGATIVE NEGATIVE Final   Influenza B by PCR NEGATIVE NEGATIVE Final    Comment: (NOTE) The Xpert Xpress SARS-CoV-2/FLU/RSV plus assay is intended as an aid in the diagnosis of influenza from Nasopharyngeal swab specimens and should not be used as a sole basis for treatment. Nasal washings and aspirates are unacceptable for Xpert Xpress SARS-CoV-2/FLU/RSV testing.  Fact Sheet for Patients: BloggerCourse.com  Fact Sheet for Healthcare Providers: SeriousBroker.it  This test is not yet approved or cleared by the [place] FDA and has been authorized for detection and/or diagnosis of SARS-CoV-2 by FDA under an Emergency Use Authorization (EUA). This EUA will remain in effect (meaning this test can be used) for the duration of the COVID-19 declaration under Section 564(b)(1) of the Act, 21 U.S.C. section 360bbb-3(b)(1), unless the authorization is terminated or revoked.     Resp Syncytial Virus by PCR NEGATIVE NEGATIVE Final    Comment: (NOTE) Fact Sheet for Patients: BloggerCourse.com  Fact Sheet for Healthcare Providers: SeriousBroker.it  This test is not yet approved or cleared by the [place] FDA and has been authorized for detection and/or diagnosis of SARS-CoV-2 by FDA under an Emergency Use Authorization (EUA). This EUA will remain in effect (meaning this test can be used) for the duration of the COVID-19 declaration under Section 564(b)(1) of the Act, 21 U.S.C. section 360bbb-3(b)(1), unless the authorization is terminated or revoked.  Performed at [facility], [address] [place], [place] [address]    Urine Culture     Status: Abnormal   Collection Time: 09/05/23  5:57 PM   Specimen: Urine, Clean Catch  Result Value Ref Range Status   Specimen Description   Final    URINE, CLEAN CATCH Performed at [facility], [address], [place], [place] [address]    Special Requests   Final    NONE Performed at [facility], [address], [place], [place] [address]    Culture MULTIPLE SPECIES PRESENT, SUGGEST RECOLLECTION (A)  Final   Report Status 09/06/2023 FINAL  Final  Culture, blood (Routine X 2) w Reflex to ID Panel     Status: None   Collection Time: 09/05/23  6:05 PM   Specimen: BLOOD  Result Value Ref Range Status   Specimen Description   Final    BLOOD LEFT ANTECUBITAL Performed at [facility], [address], [place], [place] [address]    Special Requests   Final    BOTTLES DRAWN AEROBIC AND ANAEROBIC Blood Culture results may not be optimal due to an  inadequate volume of blood received in culture bottles Performed at [facility], [address], [place], [place] [address]    Culture   Final    NO GROWTH 5 DAYS Performed at [facility], [address], [place], [place] [address]    Report Status 09/10/2023 FINAL  Final  Culture, blood (Routine X 2) w Reflex to ID Panel     Status: None   Collection Time: 09/05/23  6:42 PM   Specimen: BLOOD  Result Value Ref Range Status   Specimen Description   Final    BLOOD RIGHT ANTECUBITAL Performed at [facility], [address] [place], [place] [address]    Special Requests   Final    BOTTLES DRAWN AEROBIC AND ANAEROBIC Blood Culture results may not be optimal due to an inadequate volume of blood received in culture bottles Performed at [facility], [address], [place], [place] [address]    Culture   Final    NO GROWTH 5 DAYS Performed at [facility], [address] [place], [place]  [address]    Report Status 09/10/2023 FINAL  Final     Time coordinating discharge: 35 minutes  SIGNED: Lanae Boast, MD  [facility] 09/11/2023, 11:04 AM  If 7PM-7AM, please contact night-coverage [email or web address]

## 2023-09-11 NOTE — Plan of Care (Signed)

## 2023-09-11 NOTE — Progress Notes (Signed)
 Discharge teaching was done. Patient medication that she brought into the hospital was returned to her.

## 2023-09-11 NOTE — Plan of Care (Signed)
  Problem: Education: Goal: Knowledge of risk factors and measures for prevention of condition will improve Outcome: Progressing   Problem: Coping: Goal: Psychosocial and spiritual needs will be supported Outcome: Progressing   Problem: Respiratory: Goal: Will maintain a patent airway Outcome: Progressing   Problem: Education: Goal: Knowledge of General Education information will improve Description: Including pain rating scale, medication(s)/side effects and non-pharmacologic comfort measures Outcome: Progressing   Problem: Health Behavior/Discharge Planning: Goal: Ability to manage health-related needs will improve Outcome: Progressing   Problem: Clinical Measurements: Goal: Ability to maintain clinical measurements within normal limits will improve Outcome: Progressing Goal: Will remain free from infection Outcome: Progressing Goal: Diagnostic test results will improve Outcome: Progressing Goal: Respiratory complications will improve Outcome: Progressing Goal: Cardiovascular complication will be avoided Outcome: Progressing   Problem: Activity: Goal: Risk for activity intolerance will decrease Outcome: Progressing   Problem: Nutrition: Goal: Adequate nutrition will be maintained Outcome: Progressing   Problem: Coping: Goal: Level of anxiety will decrease Outcome: Progressing   Problem: Elimination: Goal: Will not experience complications related to bowel motility Outcome: Progressing Goal: Will not experience complications related to urinary retention Outcome: Progressing   Problem: Pain Managment: Goal: General experience of comfort will improve and/or be controlled Outcome: Progressing   Problem: Safety: Goal: Ability to remain free from injury will improve Outcome: Progressing   Problem: Skin Integrity: Goal: Risk for impaired skin integrity will decrease Outcome: Progressing

## 2023-09-13 ENCOUNTER — Other Ambulatory Visit: Payer: Self-pay

## 2023-09-13 ENCOUNTER — Other Ambulatory Visit (HOSPITAL_COMMUNITY): Payer: Self-pay

## 2023-09-20 ENCOUNTER — Other Ambulatory Visit (HOSPITAL_BASED_OUTPATIENT_CLINIC_OR_DEPARTMENT_OTHER): Payer: Self-pay

## 2023-09-30 ENCOUNTER — Encounter (HOSPITAL_COMMUNITY): Payer: Self-pay

## 2023-09-30 ENCOUNTER — Emergency Department (HOSPITAL_COMMUNITY)

## 2023-09-30 ENCOUNTER — Other Ambulatory Visit: Payer: Self-pay

## 2023-09-30 ENCOUNTER — Inpatient Hospital Stay (HOSPITAL_COMMUNITY)
Admission: EM | Admit: 2023-09-30 | Discharge: 2023-10-05 | DRG: 871 | Disposition: A | Discharge status: Home / Self Care | Attending: Internal Medicine | Admitting: Internal Medicine

## 2023-09-30 DIAGNOSIS — R9431 Abnormal electrocardiogram [ECG] [EKG]: Secondary | ICD-10-CM | POA: Diagnosis present

## 2023-09-30 DIAGNOSIS — N1831 Chronic kidney disease, stage 3a: Secondary | ICD-10-CM | POA: Diagnosis present

## 2023-09-30 DIAGNOSIS — M48061 Spinal stenosis, lumbar region without neurogenic claudication: Secondary | ICD-10-CM | POA: Diagnosis present

## 2023-09-30 DIAGNOSIS — A419 Sepsis, unspecified organism: Principal | ICD-10-CM | POA: Diagnosis present

## 2023-09-30 DIAGNOSIS — Z1152 Encounter for screening for COVID-19: Secondary | ICD-10-CM | POA: Diagnosis not present

## 2023-09-30 DIAGNOSIS — F419 Anxiety disorder, unspecified: Secondary | ICD-10-CM | POA: Diagnosis present

## 2023-09-30 DIAGNOSIS — J189 Pneumonia, unspecified organism: Secondary | ICD-10-CM | POA: Diagnosis present

## 2023-09-30 DIAGNOSIS — I2510 Atherosclerotic heart disease of native coronary artery without angina pectoris: Secondary | ICD-10-CM | POA: Diagnosis present

## 2023-09-30 DIAGNOSIS — G629 Polyneuropathy, unspecified: Secondary | ICD-10-CM | POA: Diagnosis present

## 2023-09-30 DIAGNOSIS — Z8616 Personal history of COVID-19: Secondary | ICD-10-CM | POA: Diagnosis not present

## 2023-09-30 DIAGNOSIS — R6520 Severe sepsis without septic shock: Secondary | ICD-10-CM | POA: Diagnosis present

## 2023-09-30 DIAGNOSIS — J440 Chronic obstructive pulmonary disease with acute lower respiratory infection: Secondary | ICD-10-CM | POA: Diagnosis present

## 2023-09-30 DIAGNOSIS — Y95 Nosocomial condition: Secondary | ICD-10-CM | POA: Diagnosis present

## 2023-09-30 DIAGNOSIS — N179 Acute kidney failure, unspecified: Secondary | ICD-10-CM | POA: Diagnosis present

## 2023-09-30 DIAGNOSIS — I5032 Chronic diastolic (congestive) heart failure: Secondary | ICD-10-CM | POA: Diagnosis present

## 2023-09-30 DIAGNOSIS — K5090 Crohn's disease, unspecified, without complications: Secondary | ICD-10-CM | POA: Diagnosis present

## 2023-09-30 DIAGNOSIS — Z886 Allergy status to analgesic agent status: Secondary | ICD-10-CM

## 2023-09-30 DIAGNOSIS — Z87891 Personal history of nicotine dependence: Secondary | ICD-10-CM

## 2023-09-30 DIAGNOSIS — Z9981 Dependence on supplemental oxygen: Secondary | ICD-10-CM

## 2023-09-30 DIAGNOSIS — I130 Hypertensive heart and chronic kidney disease with heart failure and stage 1 through stage 4 chronic kidney disease, or unspecified chronic kidney disease: Secondary | ICD-10-CM | POA: Diagnosis present

## 2023-09-30 DIAGNOSIS — G8929 Other chronic pain: Secondary | ICD-10-CM | POA: Diagnosis present

## 2023-09-30 DIAGNOSIS — E872 Acidosis, unspecified: Secondary | ICD-10-CM | POA: Diagnosis present

## 2023-09-30 DIAGNOSIS — Z7951 Long term (current) use of inhaled steroids: Secondary | ICD-10-CM

## 2023-09-30 DIAGNOSIS — Z888 Allergy status to other drugs, medicaments and biological substances status: Secondary | ICD-10-CM

## 2023-09-30 DIAGNOSIS — Z881 Allergy status to other antibiotic agents status: Secondary | ICD-10-CM

## 2023-09-30 DIAGNOSIS — I160 Hypertensive urgency: Secondary | ICD-10-CM | POA: Diagnosis present

## 2023-09-30 DIAGNOSIS — R918 Other nonspecific abnormal finding of lung field: Secondary | ICD-10-CM | POA: Diagnosis present

## 2023-09-30 DIAGNOSIS — Z880 Allergy status to penicillin: Secondary | ICD-10-CM

## 2023-09-30 DIAGNOSIS — Z91013 Allergy to seafood: Secondary | ICD-10-CM

## 2023-09-30 DIAGNOSIS — Z90710 Acquired absence of both cervix and uterus: Secondary | ICD-10-CM

## 2023-09-30 DIAGNOSIS — Z79899 Other long term (current) drug therapy: Secondary | ICD-10-CM | POA: Diagnosis not present

## 2023-09-30 DIAGNOSIS — J9611 Chronic respiratory failure with hypoxia: Secondary | ICD-10-CM | POA: Diagnosis present

## 2023-09-30 DIAGNOSIS — K219 Gastro-esophageal reflux disease without esophagitis: Secondary | ICD-10-CM | POA: Diagnosis present

## 2023-09-30 DIAGNOSIS — E785 Hyperlipidemia, unspecified: Secondary | ICD-10-CM | POA: Diagnosis present

## 2023-09-30 DIAGNOSIS — Z885 Allergy status to narcotic agent status: Secondary | ICD-10-CM

## 2023-09-30 LAB — COMPREHENSIVE METABOLIC PANEL
ALT: 9 U/L (ref 0–44)
AST: 31 U/L (ref 15–41)
Albumin: 4 g/dL (ref 3.5–5.0)
Alkaline Phosphatase: 34 U/L — ABNORMAL LOW (ref 38–126)
Anion gap: 13 (ref 5–15)
BUN: 33 mg/dL — ABNORMAL HIGH (ref 8–23)
CO2: 29 mmol/L (ref 22–32)
Calcium: 9.6 mg/dL (ref 8.9–10.3)
Chloride: 98 mmol/L (ref 98–111)
Creatinine, Ser: 1.39 mg/dL — ABNORMAL HIGH (ref 0.44–1.00)
GFR, Estimated: 38 mL/min — ABNORMAL LOW (ref >60)
Glucose, Bld: 103 mg/dL — ABNORMAL HIGH (ref 70–99)
Potassium: 4.9 mmol/L (ref 3.5–5.1)
Sodium: 140 mmol/L (ref 135–145)
Total Bilirubin: 1.3 mg/dL — ABNORMAL HIGH (ref 0.0–1.2)
Total Protein: 8 g/dL (ref 6.5–8.1)

## 2023-09-30 LAB — URINALYSIS, W/ REFLEX TO CULTURE (INFECTION SUSPECTED)
Bacteria, UA: NONE SEEN
Bilirubin Urine: NEGATIVE
Glucose, UA: NEGATIVE mg/dL
Hgb urine dipstick: NEGATIVE
Ketones, ur: NEGATIVE mg/dL
Leukocytes,Ua: NEGATIVE
Nitrite: NEGATIVE
Protein, ur: NEGATIVE mg/dL
Specific Gravity, Urine: 1.014 (ref 1.005–1.030)
pH: 7 (ref 5.0–8.0)

## 2023-09-30 LAB — RESP PANEL BY RT-PCR (RSV, FLU A&B, COVID)  RVPGX2
Influenza A by PCR: NEGATIVE
Influenza B by PCR: NEGATIVE
Resp Syncytial Virus by PCR: NEGATIVE
SARS Coronavirus 2 by RT PCR: NEGATIVE

## 2023-09-30 LAB — CBC WITH DIFFERENTIAL/PLATELET
Abs Immature Granulocytes: 0.04 10*3/uL (ref 0.00–0.07)
Basophils Absolute: 0.1 10*3/uL (ref 0.0–0.1)
Basophils Relative: 1 %
Eosinophils Absolute: 0.1 10*3/uL (ref 0.0–0.5)
Eosinophils Relative: 1 %
HCT: 42 % (ref 36.0–46.0)
Hemoglobin: 12.7 g/dL (ref 12.0–15.0)
Immature Granulocytes: 0 %
Lymphocytes Relative: 7 %
Lymphs Abs: 0.7 10*3/uL (ref 0.7–4.0)
MCH: 29.7 pg (ref 26.0–34.0)
MCHC: 30.2 g/dL (ref 30.0–36.0)
MCV: 98.1 fL (ref 80.0–100.0)
Monocytes Absolute: 0.7 10*3/uL (ref 0.1–1.0)
Monocytes Relative: 7 %
Neutro Abs: 8.5 10*3/uL — ABNORMAL HIGH (ref 1.7–7.7)
Neutrophils Relative %: 84 %
Platelets: 194 10*3/uL (ref 150–400)
RBC: 4.28 MIL/uL (ref 3.87–5.11)
RDW: 14.7 % (ref 11.5–15.5)
WBC: 10.2 10*3/uL (ref 4.0–10.5)
nRBC: 0 % (ref 0.0–0.2)

## 2023-09-30 LAB — PROTIME-INR
INR: 1.2 (ref 0.8–1.2)
Prothrombin Time: 14.9 s (ref 11.4–15.2)

## 2023-09-30 LAB — LACTIC ACID, PLASMA
Lactic Acid, Venous: 2.5 mmol/L (ref 0.5–1.9)
Lactic Acid, Venous: 0.7 mmol/L (ref 0.5–1.9)

## 2023-09-30 LAB — APTT: aPTT: 24 s (ref 24–36)

## 2023-09-30 MED ORDER — UMECLIDINIUM BROMIDE 62.5 MCG/ACT IN AEPB
1.0000 | INHALATION_SPRAY | Freq: Every day | RESPIRATORY_TRACT | Status: DC
  Administered 2023-10-01 – 2023-10-05 (×5): 1 via RESPIRATORY_TRACT
  Filled 2023-09-30: qty 7

## 2023-09-30 MED ORDER — CEFTRIAXONE SODIUM 2 G IJ SOLR
2.0000 g | INTRAMUSCULAR | Status: AC
  Administered 2023-09-30 – 2023-10-04 (×5): 2 g via INTRAVENOUS
  Filled 2023-09-30 (×5): qty 20

## 2023-09-30 MED ORDER — CYCLOBENZAPRINE HCL 5 MG PO TABS
5.0000 mg | ORAL_TABLET | Freq: Two times a day (BID) | ORAL | Status: DC | PRN
  Administered 2023-09-30 – 2023-10-03 (×7): 5 mg via ORAL
  Filled 2023-09-30 (×7): qty 1

## 2023-09-30 MED ORDER — SENNOSIDES-DOCUSATE SODIUM 8.6-50 MG PO TABS: 1.0000 | ORAL_TABLET | Freq: Every evening | ORAL | Status: DC | PRN

## 2023-09-30 MED ORDER — LEVALBUTEROL HCL 0.63 MG/3ML IN NEBU: 0.6300 mg | INHALATION_SOLUTION | RESPIRATORY_TRACT | Status: DC | PRN

## 2023-09-30 MED ORDER — ATENOLOL 25 MG PO TABS
12.5000 mg | ORAL_TABLET | Freq: Every day | ORAL | Status: DC
  Administered 2023-09-30 – 2023-10-05 (×6): 12.5 mg via ORAL
  Filled 2023-09-30: qty 1
  Filled 2023-09-30: qty 0.5
  Filled 2023-09-30 (×5): qty 1

## 2023-09-30 MED ORDER — FLUTICASONE FUROATE-VILANTEROL 200-25 MCG/ACT IN AEPB
1.0000 | INHALATION_SPRAY | Freq: Every day | RESPIRATORY_TRACT | Status: DC
  Administered 2023-10-01 – 2023-10-05 (×5): 1 via RESPIRATORY_TRACT
  Filled 2023-09-30: qty 28

## 2023-09-30 MED ORDER — FENOFIBRATE 160 MG PO TABS
160.0000 mg | ORAL_TABLET | Freq: Every day | ORAL | Status: DC
  Administered 2023-10-01 – 2023-10-05 (×5): 160 mg via ORAL
  Filled 2023-09-30 (×5): qty 1

## 2023-09-30 MED ORDER — HEPARIN SODIUM (PORCINE) 5000 UNIT/ML IJ SOLN
5000.0000 [IU] | Freq: Three times a day (TID) | INTRAMUSCULAR | Status: DC
  Administered 2023-09-30 – 2023-10-05 (×14): 5000 [IU] via SUBCUTANEOUS
  Filled 2023-09-30 (×15): qty 1

## 2023-09-30 MED ORDER — ONDANSETRON HCL 4 MG/2ML IJ SOLN
4.0000 mg | Freq: Four times a day (QID) | INTRAMUSCULAR | Status: DC | PRN
Start: 2023-09-30 — End: 2023-10-05
  Administered 2023-10-03: 4 mg via INTRAVENOUS
  Filled 2023-09-30: qty 2

## 2023-09-30 MED ORDER — ACETAMINOPHEN 650 MG RE SUPP: 650.0000 mg | Freq: Four times a day (QID) | RECTAL | Status: DC | PRN

## 2023-09-30 MED ORDER — EZETIMIBE 10 MG PO TABS
10.0000 mg | ORAL_TABLET | Freq: Every day | ORAL | Status: DC
  Administered 2023-09-30 – 2023-10-04 (×5): 10 mg via ORAL
  Filled 2023-09-30 (×5): qty 1

## 2023-09-30 MED ORDER — OXYCODONE-ACETAMINOPHEN 5-325 MG PO TABS
1.0000 | ORAL_TABLET | Freq: Four times a day (QID) | ORAL | Status: AC | PRN
  Administered 2023-09-30 – 2023-10-01 (×2): 1 via ORAL
  Filled 2023-09-30 (×2): qty 1

## 2023-09-30 MED ORDER — SODIUM CHLORIDE 0.9 % IV BOLUS
1000.0000 mL | Freq: Once | INTRAVENOUS | Status: AC
  Administered 2023-09-30: 1000 mL via INTRAVENOUS

## 2023-09-30 MED ORDER — ONDANSETRON HCL 4 MG PO TABS: 4.0000 mg | ORAL_TABLET | Freq: Four times a day (QID) | ORAL | Status: DC | PRN

## 2023-09-30 MED ORDER — PANTOPRAZOLE SODIUM 40 MG PO TBEC
40.0000 mg | DELAYED_RELEASE_TABLET | Freq: Every day | ORAL | Status: DC
  Administered 2023-09-30 – 2023-10-05 (×6): 40 mg via ORAL
  Filled 2023-09-30 (×6): qty 1

## 2023-09-30 MED ORDER — LEVOFLOXACIN IN D5W 750 MG/150ML IV SOLN: 750.0000 mg | INTRAVENOUS | Status: DC

## 2023-09-30 MED ORDER — ACETAMINOPHEN 325 MG PO TABS
650.0000 mg | ORAL_TABLET | Freq: Four times a day (QID) | ORAL | Status: DC | PRN
  Administered 2023-09-30 – 2023-10-01 (×2): 650 mg via ORAL
  Filled 2023-09-30 (×2): qty 2

## 2023-09-30 NOTE — ED Triage Notes (Addendum)
 Patient BIB [facility] from home. Sister called EMS. Weakness, short of breath, cough, confusion began yesterday. Wears 4L [place] baseline. Patient alert to self and place only. Thinks it is 41. Complaining of back pain.

## 2023-09-30 NOTE — H&P (Addendum)
 History and Physical  Brittany Irwin VOZ:[id number] DOB: 02-13-1941 DOA: 09/30/2023  PCP: Teodoro Spray, MD   Chief Complaint: Weakness, fever, confusion  HPI: Brittany Irwin is a 83 y.o. female with medical history significant for COPD chronically on 4 L nasal cannula oxygen, recent hospitalization for SBO and more recently 1 month ago for acute COVID-19 virus infection being admitted to the hospital with sepsis due to healthcare acquired pneumonia.  The patient seems to be a little bit confused, with slow responses unable to give me some intermittent information.  I was able to speak with the patient's Sister Bonita Quin over the phone.  Apparently the patient has been mildly confused and coughing up some thick yellow sputum at home.  Patient lives on her own but tells me that her sister lives nearby and takes close care of her.  Patient herself says that as expected since her COVID diagnosis about a month ago, she has been a bit weak but denies specific symptoms such as fever, chest pain or significant shortness of breath.  Sister corroborates this, says that she has had a persistent cough persistent weakness since her COVID diagnosis 1 week ago.  Says that yesterday she got particularly concerned because the patient seemed to be having intermittent episodes of confusion, and then would go back to normal.  She did not check a temperature, does not know if she had any fevers.  The patient never complained of chills.  She was seen recently as an outpatient on 09/22/2023 by her outpatient pulmonologist Dr. Patricia Pesa who prescribed 7 days of Ceftin, with a 4-day prednisone taper.  At the time he commented on her QTc being 519, indicating a contraindication to Levaquin.  In the emergency department, the patient was noted to be stable on her baseline oxygen, chest x-ray as noted below with concern for consolidation, she also had elevated lactic acid, fever and tachycardia.  She was empirically ordered IV Levaquin  and hospitalist was contacted for admission.  Review of Systems: Please see HPI for pertinent positives and negatives. A complete 10 system review of systems are otherwise negative.  Past Medical History:  Diagnosis Date   Acute CHF (congestive heart failure) (HCC) 01/02/2022   AKI (acute kidney injury) (HCC) 09/08/2020   Aspiration pneumonia (HCC) 01/05/2019   Atypical chest pain 03/11/2022   Bradycardia 03/14/2020   C. difficile colitis 04/03/2021   CHF (congestive heart failure) (HCC)    Coronary artery disease    Tachycardia 05/31/2013   Past Surgical History:  Procedure Laterality Date   ABDOMINAL HYSTERECTOMY     BOWEL RESECTION     CHOLECYSTECTOMY     Social History:  reports that she has quit smoking. Her smoking use included cigarettes. She does not have any smokeless tobacco history on file. She reports that she does not currently use alcohol. She reports that she does not use drugs.  Allergies  Allergen Reactions   Methylprednisolone Other (See Comments)    Increased BP, HR, agitation, hallucinations    Shellfish Allergy Nausea And Vomiting    All SEAFOOD   Ativan [Lorazepam]     Psychosis   Azulfidine [Sulfasalazine] Other (See Comments)    Headache    Epipen [Epinephrine] Hypertension   Flagyl [Metronidazole] Hives   Motrin [Ibuprofen] Nausea Only and Other (See Comments)    Told not to take medication due to GI distress caused by crohn's disease.   Nsaids Other (See Comments)    Told not to take NSAIDs due to  GI distress caused by crohn's disease   Penicillins Hives   Morphine And Codeine Itching    Told not to take due to GI distress caused by crohn's disease    History reviewed. No pertinent family history.   Prior to Admission medications   Medication Sig Start Date End Date Taking? Authorizing Provider  acetaminophen-codeine (TYLENOL #4) 300-60 MG tablet Take 1 tablet by mouth 4 (four) times daily as needed for pain. 12/31/21   [provider]  Aloe-Sodium Chloride (AYR SALINE NASAL GEL NA) Place 1 Application into the nose 2 (two) times daily as needed (Dry nose).    [provider]  ALPRAZolam Prudy Feeler) 1 MG tablet Take 1 mg by mouth 3 (three) times daily as needed for anxiety. 01/02/22   [provider]  atenolol (TENORMIN) 25 MG tablet Take half tablet (12.5 mg) by mouth daily. DO NOT take if heart rate is less than 60 BPM Patient taking differently: Take 25 mg by mouth daily. DO NOT take if heart rate is less than 60 BPM 10/21/22   Jonita Albee, PA-C  bumetanide (BUMEX) 1 MG tablet Take 1 tablet (1 mg total) by mouth daily as needed. Patient taking differently: Take 1 mg by mouth daily. 08/15/23   Dahal, Melina Schools, MD  cyanocobalamin (,VITAMIN B-12,) 1000 MCG/ML injection Inject 1,000 mcg into the muscle every 30 (thirty) days.    [provider]  cyclobenzaprine (FLEXERIL) 5 MG tablet Take 5 mg by mouth 2 (two) times daily as needed for muscle spasms. 10/13/22   [provider]  diclofenac Sodium (VOLTAREN ARTHRITIS PAIN) 1 % GEL Apply 1 Application topically 2 (two) times daily as needed (back pain).    [provider]  ergocalciferol (VITAMIN D2) 1.25 MG (50000 UT) capsule Take 50,000 Units by mouth every 14 (fourteen) days. Every other Friday    [provider]  ezetimibe (ZETIA) 10 MG tablet Take 10 mg by mouth at bedtime. 12/31/21   [provider]  fenofibrate micronized (LOFIBRA) 200 MG capsule Take 200 mg by mouth daily. 12/31/21   [provider]  Fluticasone-Umeclidin-Vilant (TRELEGY ELLIPTA) 200-62.5-25 MCG/ACT AEPB Inhale 1 puff into the lungs daily. 08/31/22     guaiFENesin-dextromethorphan (ROBITUSSIN DM) 100-10 MG/5ML syrup Take 10 mLs by mouth every 4 (four) hours as needed for cough. 09/11/23   Lanae Boast, MD  levalbuterol Pauline Aus) 0.63 MG/3ML nebulizer solution Take 3 mLs (0.63 mg total) by nebulization every 4 (four) hours as needed for wheezing or  shortness of breath. 09/11/23 10/11/23  Lanae Boast, MD  loratadine (CLARITIN) 10 MG tablet Take 1 tablet (10 mg total) by mouth daily for 14 days. 09/12/23 09/26/23  Lanae Boast, MD  losartan (COZAAR) 25 MG tablet Take 0.5 tablets (12.5 mg total) by mouth daily. Patient taking differently: Take 25 mg by mouth daily. 10/21/22   Jonita Albee, PA-C  melatonin 3 MG TABS tablet Take 1 tablet (3 mg total) by mouth at bedtime. Patient taking differently: Take 3 mg by mouth at bedtime as needed (sleep). 08/15/23   DahalMelina Schools, MD  Menthol, Topical Analgesic, (ICY HOT BACK EX) Apply 1 Application topically 2 (two) times daily as needed (back pain).    [provider]  nitroGLYCERIN (NITROSTAT) 0.4 MG SL tablet Place 0.4 mg under the tongue every 5 (five) minutes x 3 doses as needed for chest pain. 12/31/21   [provider]  omeprazole (PRILOSEC) 40 MG capsule Take 40 mg by mouth daily.  [provider]  potassium chloride SA (KLOR-CON M) 20 MEQ tablet Take 20 mEq by mouth daily. 12/31/21   [provider]  pregabalin (LYRICA) 50 MG capsule Take 50 mg by mouth 2 (two) times daily.    [provider]    Physical Exam: BP (!) 164/66   Pulse 100   Temp (!) 102 F (38.9 C) (Oral)   Resp (!) 23   Ht 5\' 4"  (1.626 m)   Wt 74 kg   SpO2 92%   BMI 28.00 kg/m  General:  Alert, oriented, calm, in no acute distress, resting comfortably on her baseline 3 L nasal cannula oxygen.  She is able to speak in full sentences.  Intermittently she is oriented x 4, however when asked questions about her recent history she is not able to answer reliably.  However she is able to tell me that she was recently hospitalized with small bowel obstruction, and more recently COVID infection. Eyes: EOMI, clear conjuctivae, white sclerea Neck: supple, no masses, trachea mildline  Cardiovascular: RRR, no murmurs or rubs, no peripheral edema  Respiratory: Breath sounds are distant but clear  to auscultation bilaterally with some crackles at the right base, no wheezes, no rhonchi or respiratory distress Abdomen: soft, nontender, nondistended, normal bowel tones heard  Skin: dry, no rashes  Musculoskeletal: no joint effusions, normal range of motion  Psychiatric: appropriate affect, normal speech  Neurologic: extraocular muscles intact, clear speech, moving all extremities with intact sensorium         Labs on Admission:  Basic Metabolic Panel: Recent Labs  Lab 09/30/23 1004  NA 140  K 4.9  CL 98  CO2 29  GLUCOSE 103*  BUN 33*  CREATININE 1.39*  CALCIUM 9.6   Liver Function Tests: Recent Labs  Lab 09/30/23 1004  AST 31  ALT 9  ALKPHOS 34*  BILITOT 1.3*  PROT 8.0  ALBUMIN 4.0   No results for input(s): "LIPASE", "AMYLASE" in the last 168 hours. No results for input(s): "AMMONIA" in the last 168 hours. CBC: Recent Labs  Lab 09/30/23 1004  WBC 10.2  NEUTROABS 8.5*  HGB 12.7  HCT 42.0  MCV 98.1  PLT 194   Cardiac Enzymes: No results for input(s): "CKTOTAL", "CKMB", "CKMBINDEX", "TROPONINI" in the last 168 hours. BNP (last 3 results) Recent Labs    10/14/22 0900  BNP 321.6*    ProBNP (last 3 results) No results for input(s): "PROBNP" in the last 8760 hours.  CBG: No results for input(s): "GLUCAP" in the last 168 hours.  Radiological Exams on Admission: DG Chest 2 View Result Date: 09/30/2023 CLINICAL DATA:  high clinical suspicion for PNA. Weakness. Shortness of breath. Cough. EXAM: CHEST - 2 VIEW COMPARISON:  09/05/2023. FINDINGS: Low lung volume. There is a new approximately 1.2 x 1.5 cm nodular opacity overlying the right lateral costophrenic angle region. The opacity is not clearly seen on the lateral film. Further evaluation with chest CT scan is recommended. There are probable atelectatic changes in the retrocardiac region, right more than left. Bilateral lung fields are otherwise clear. Bilateral costophrenic angles are clear. Normal  cardio-mediastinal silhouette. There is a left sided 2-lead pacemaker. No acute osseous abnormalities. The soft tissues are within normal limits. IMPRESSION: *There is a new approximately 1.2 x 1.5 cm nodular opacity overlying the right lateral costophrenic angle region. The opacity is not clearly seen on the lateral film. Further evaluation with chest CT scan is recommended. Electronically Signed   By: Jules Schick  M.D.   On: 09/30/2023 11:47   Assessment/Plan Malachy Chamber is a 83 y.o. female with medical history significant for COPD chronically on 4 L nasal cannula oxygen, recent hospitalization for SBO and more recently one month ago for acute COVID-19 virus infection being admitted to the hospital with sepsis due to healthcare acquired pneumonia.   Severe sepsis-meeting criteria with tachycardia, fever, source is healthcare-acquired pneumonia.  Complicated by elevated lactic acid, and AKI. -Inpatient admission to progressive -Given QT prolongation and multiple allergies, will discontinue Levaquin and treat for now with IV Rocephin, discussed with pharmacy -Follow-up blood cultures -Follow-up CT chest -Received 1 L normal saline this morning, will start maintenance IV fluids, check repeat lactate stat and give further fluid boluses as indicated  HCAP-treating as above  COPD-chronically on 4 L nasal cannula oxygen, currently does not seem to have any evidence of acute exacerbation -Continue supplemental oxygen -Continue home Breo/Incruse -Xopenex neb as needed  AKI in the setting of documented CKD-although it appears the patient's baseline renal function lately has been normal.  Current AKI is likely due to her sepsis. -Hold nephrotoxins -Treat sepsis as above  Pretension-continue home dose atenolol  Lactic acidosis-due to sepsis, treating as above.  Check stat lactate now.  Peripheral neuropathy-Flexeril as needed  GERD-Protonix  DVT prophylaxis: Heparin subcu    Code  Status: Full Code  Consults called: None  Admission status: The appropriate patient status for this patient is INPATIENT. Inpatient status is judged to be reasonable and necessary in order to provide the required intensity of service to ensure the patient's safety. The patient's presenting symptoms, physical exam findings, and initial radiographic and laboratory data in the context of their chronic comorbidities is felt to place them at high risk for further clinical deterioration. Furthermore, it is not anticipated that the patient will be medically stable for discharge from the hospital within 2 midnights of admission.    I certify that at the point of admission it is my clinical judgment that the patient will require inpatient hospital care spanning beyond 2 midnights from the point of admission due to high intensity of service, high risk for further deterioration and high frequency of surveillance required  Time spent: 55 minutes  Grabiel Schmutz Sharlette Dense MD [facility] Pager [phone]  If 7PM-7AM, please contact night-coverage [email or web address] Password [email or web address]  09/30/2023, 1:10 PM

## 2023-09-30 NOTE — ED Provider Notes (Signed)
  EMERGENCY DEPARTMENT AT [facility] Provider Note   CSN: [id number] Arrival date & time: 09/30/23  7829     History  Chief Complaint  Patient presents with   Altered Mental Status    Brittany Irwin is a 83 y.o. female presenting to the ED with concern for confusion, fevers, cough, shortness of breath.  The patient has a complicated medical history that I reviewed prior external records including most recent hospital discharge approximately 1 month ago for acute COVID-19 virus infection.  Her history includes COPD and chronic respiratory failure on 3 L nasal cannula, stage III kidney disease, history of bowel obstruction, Crohn's disease, chronic pain, anemia, chronic diastolic heart failure and hypertension.  Patient herself is a poor historian at this time and is having difficulty recalling details of how long she has been sick for.  Reportedly from EMS her sister called 911 and noting that the patient was confused and had a thick productive cough.  From reviewing her most recent hospital course, she was admitted that time for sepsis ultimately suspected to be due to acute COVID-19 infection.  She was given remdesivir.  She was initially on BiPAP but weaned off to nasal cannula.  She was reported to not tolerate steroids due to agitation & hallucinations.  She was also managed for hypertensive urgency.  From a more recent pulmonology office evaluation on the fifth she appears that been prescribed a week of Ceftin and reports that she finishes this antibiotic yesterday.  Pulmonologist note this was prescribed for a cough with discolored sputum production.  HPI     Home Medications Prior to Admission medications   Medication Sig Start Date End Date Taking? Authorizing Provider  acetaminophen-codeine (TYLENOL #4) 300-60 MG tablet Take 1 tablet by mouth 4 (four) times daily as needed for pain. 12/31/21   [provider]  Aloe-Sodium Chloride (AYR SALINE  NASAL GEL NA) Place 1 Application into the nose 2 (two) times daily as needed (Dry nose).    [provider]  ALPRAZolam Prudy Feeler) 1 MG tablet Take 1 mg by mouth 3 (three) times daily as needed for anxiety. 01/02/22   [provider]  atenolol (TENORMIN) 25 MG tablet Take half tablet (12.5 mg) by mouth daily. DO NOT take if heart rate is less than 60 BPM Patient taking differently: Take 25 mg by mouth daily. DO NOT take if heart rate is less than 60 BPM 10/21/22   Jonita Albee, PA-C  bumetanide (BUMEX) 1 MG tablet Take 1 tablet (1 mg total) by mouth daily as needed. Patient taking differently: Take 1 mg by mouth daily. 08/15/23   Dahal, Melina Schools, MD  cyanocobalamin (,VITAMIN B-12,) 1000 MCG/ML injection Inject 1,000 mcg into the muscle every 30 (thirty) days.    [provider]  cyclobenzaprine (FLEXERIL) 5 MG tablet Take 5 mg by mouth 2 (two) times daily as needed for muscle spasms. 10/13/22   [provider]  diclofenac Sodium (VOLTAREN ARTHRITIS PAIN) 1 % GEL Apply 1 Application topically 2 (two) times daily as needed (back pain).    [provider]  ergocalciferol (VITAMIN D2) 1.25 MG (50000 UT) capsule Take 50,000 Units by mouth every 14 (fourteen) days. Every other Friday    [provider]  ezetimibe (ZETIA) 10 MG tablet Take 10 mg by mouth at bedtime. 12/31/21   [provider]  fenofibrate micronized (LOFIBRA) 200 MG capsule Take 200 mg by mouth daily. 12/31/21   [provider]  Fluticasone-Umeclidin-Vilant (TRELEGY ELLIPTA) 200-62.5-25 MCG/ACT AEPB Inhale 1 puff into the lungs daily. 08/31/22     guaiFENesin-dextromethorphan (ROBITUSSIN DM) 100-10 MG/5ML syrup Take 10 mLs by mouth every 4 (four) hours as needed for cough. 09/11/23   Lanae Boast, MD  levalbuterol Pauline Aus) 0.63 MG/3ML nebulizer solution Take 3 mLs (0.63 mg total) by nebulization every 4 (four) hours as needed for wheezing or shortness of breath. 09/11/23 10/11/23   Lanae Boast, MD  loratadine (CLARITIN) 10 MG tablet Take 1 tablet (10 mg total) by mouth daily for 14 days. 09/12/23 09/26/23  Lanae Boast, MD  losartan (COZAAR) 25 MG tablet Take 0.5 tablets (12.5 mg total) by mouth daily. Patient taking differently: Take 25 mg by mouth daily. 10/21/22   Jonita Albee, PA-C  melatonin 3 MG TABS tablet Take 1 tablet (3 mg total) by mouth at bedtime. Patient taking differently: Take 3 mg by mouth at bedtime as needed (sleep). 08/15/23   DahalMelina Schools, MD  Menthol, Topical Analgesic, (ICY HOT BACK EX) Apply 1 Application topically 2 (two) times daily as needed (back pain).    [provider]  nitroGLYCERIN (NITROSTAT) 0.4 MG SL tablet Place 0.4 mg under the tongue every 5 (five) minutes x 3 doses as needed for chest pain. 12/31/21   [provider]  omeprazole (PRILOSEC) 40 MG capsule Take 40 mg by mouth daily.    [provider]  potassium chloride SA (KLOR-CON M) 20 MEQ tablet Take 20 mEq by mouth daily. 12/31/21   [provider]  pregabalin (LYRICA) 50 MG capsule Take 50 mg by mouth 2 (two) times daily.    [provider]      Allergies    Methylprednisolone, Shellfish allergy, Ativan [lorazepam], Azulfidine [sulfasalazine], Epipen [epinephrine], Flagyl [metronidazole], Motrin [ibuprofen], Nsaids, Penicillins, and Morphine and codeine    Review of Systems   Review of Systems  Physical Exam Updated Vital Signs BP (!) 164/66   Pulse 100   Temp (!) 102 F (38.9 C) (Oral)   Resp (!) 23   Ht 5\' 4"  (1.626 m)   Wt 74 kg   SpO2 92%   BMI 28.00 kg/m  Physical Exam Constitutional:      General: She is not in acute distress. HENT:     Head: Normocephalic and atraumatic.  Eyes:     Conjunctiva/sclera: Conjunctivae normal.     Pupils: Pupils are equal, round, and reactive to light.  Cardiovascular:     Rate and Rhythm: Regular rhythm. Tachycardia present.  Pulmonary:     Effort: Pulmonary effort is normal. No  respiratory distress.     Comments: 91% on 4L [place] Productive cough with rhonchi in the lower lobes, no audible wheezing Abdominal:     General: There is no distension.     Tenderness: There is no abdominal tenderness.  Skin:    General: Skin is warm and dry.  Neurological:     General: No focal deficit present.     Mental Status: She is alert. Mental status is at baseline.  Psychiatric:        Mood and Affect: Mood normal.        Behavior: Behavior normal.     ED Results / Procedures / Treatments   Labs (all labs ordered are listed, but only abnormal results are displayed) Labs Reviewed  COMPREHENSIVE METABOLIC PANEL - Abnormal; Notable for the following components:      Result Value   Glucose, Bld 103 (*)    BUN  33 (*)    Creatinine, Ser 1.39 (*)    Alkaline Phosphatase 34 (*)    Total Bilirubin 1.3 (*)    GFR, Estimated 38 (*)    All other components within normal limits  CBC WITH DIFFERENTIAL/PLATELET - Abnormal; Notable for the following components:   Neutro Abs 8.5 (*)    All other components within normal limits  LACTIC ACID, PLASMA - Abnormal; Notable for the following components:   Lactic Acid, Venous 2.5 (*)    All other components within normal limits  CULTURE, BLOOD (ROUTINE X 2)  RESP PANEL BY RT-PCR (RSV, FLU A&B, COVID)  RVPGX2  CULTURE, BLOOD (ROUTINE X 2)  PROTIME-INR  APTT  URINALYSIS, W/ REFLEX TO CULTURE (INFECTION SUSPECTED)  LACTIC ACID, PLASMA  I-STAT CG4 LACTIC ACID, ED  I-STAT CG4 LACTIC ACID, ED    EKG None  Radiology DG Chest 2 View Result Date: 09/30/2023 CLINICAL DATA:  high clinical suspicion for PNA. Weakness. Shortness of breath. Cough. EXAM: CHEST - 2 VIEW COMPARISON:  09/05/2023. FINDINGS: Low lung volume. There is a new approximately 1.2 x 1.5 cm nodular opacity overlying the right lateral costophrenic angle region. The opacity is not clearly seen on the lateral film. Further evaluation with chest CT scan is recommended. There are  probable atelectatic changes in the retrocardiac region, right more than left. Bilateral lung fields are otherwise clear. Bilateral costophrenic angles are clear. Normal cardio-mediastinal silhouette. There is a left sided 2-lead pacemaker. No acute osseous abnormalities. The soft tissues are within normal limits. IMPRESSION: *There is a new approximately 1.2 x 1.5 cm nodular opacity overlying the right lateral costophrenic angle region. The opacity is not clearly seen on the lateral film. Further evaluation with chest CT scan is recommended. Electronically Signed   By: Jules Schick M.D.   On: 09/30/2023 11:47    Procedures Procedures    Medications Ordered in ED Medications  atenolol (TENORMIN) tablet 12.5 mg (has no administration in time range)  ezetimibe (ZETIA) tablet 10 mg (has no administration in time range)  fenofibrate tablet 160 mg (has no administration in time range)  pantoprazole (PROTONIX) EC tablet 40 mg (has no administration in time range)  cyclobenzaprine (FLEXERIL) tablet 5 mg (has no administration in time range)  fluticasone furoate-vilanterol (BREO ELLIPTA) 200-25 MCG/ACT 1 puff (1 puff Inhalation Not Given 09/30/23 1359)    And  umeclidinium bromide (INCRUSE ELLIPTA) 62.5 MCG/ACT 1 puff (1 puff Inhalation Not Given 09/30/23 1400)  levalbuterol (XOPENEX) nebulizer solution 0.63 mg (has no administration in time range)  heparin injection 5,000 Units (has no administration in time range)  acetaminophen (TYLENOL) tablet 650 mg (has no administration in time range)    Or  acetaminophen (TYLENOL) suppository 650 mg (has no administration in time range)  senna-docusate (Senokot-S) tablet 1 tablet (has no administration in time range)  ondansetron (ZOFRAN) tablet 4 mg (has no administration in time range)    Or  ondansetron (ZOFRAN) injection 4 mg (has no administration in time range)  cefTRIAXone (ROCEPHIN) 2 g in sodium chloride 0.9 % 100 mL IVPB (has no administration in  time range)  sodium chloride 0.9 % bolus 1,000 mL (0 mLs Intravenous Stopped 09/30/23 1410)    ED Course/ Medical Decision Making/ A&P Clinical Course as of 09/30/23 1510  Thu Sep 30, 2023  1303 Admitted to hospitalist [MT]    Clinical Course User Index [MT] Renaye Rakers Kermit Balo, MD  Medical Decision Making Amount and/or Complexity of Data Reviewed Labs: ordered. Radiology: ordered.  Risk Decision regarding hospitalization.   This patient presents to the ED with concern for fever, cough. This involves an extensive number of treatment options, and is a complaint that carries with it a high risk of complications and morbidity.  The differential diagnosis includes bacterial pneumonia versus urinary tract infection or other source of infection versus viral URI versus other  Patient does not present with audible wheezing on exam to require DuoNebs.  She is also noted to not tolerate steroids due to agitation and hallucinations.  Of note, the patient a COVID-19 1 month ago in the hospital, and may have a continued positive result, but is no longer acutely infectious with COVID.  Patient completed 1 week of Ceftin for presumed PNA yesterday.  Co-morbidities that complicate the patient evaluation: History of COPD at risk of pulmonary exacerbation; history of chronic hypoxia  Additional history obtained from EMS  External records from outside source obtained and reviewed including most recent hospital course and discharge summary   I ordered and personally interpreted labs.  The pertinent results include: White blood cell count 10.3.  Lactate level abated 2.5.  CMP near baseline levels with minor elevation of creatinine 1.4.  UA without clear evidence of infection.  COVID and flu test are negative  I ordered imaging studies including x-ray of the chest, CT chest I independently visualized and interpreted imaging which showed irregular right-sided pulmonary  nodule versus infection on xray, CT chest pending upon admission I agree with the radiologist interpretation  The patient was maintained on a cardiac monitor.  I personally viewed and interpreted the cardiac monitored which showed an underlying rhythm of: Sinus rhythm and sinus tachycardia   I ordered medication including IV levaquin for suspected pulmonary infection (given that the patient had failed outpatient cephalosporin treatment this week)  I have reviewed the patients home medicines and have made adjustments as needed  Test Considered: Low suspicion for acute PE, meningitis, intra-abdominal emergency  After the interventions noted above, I reevaluated the patient and found that they have: stayed the same   Dispostion:  After consideration of the diagnostic results and the patients response to treatment, I feel that the patent would benefit from medical admission for suspected pulmonary infection/sepsis with confusion.         Final Clinical Impression(s) / ED Diagnoses Final diagnoses:  Sepsis, due to unspecified organism, unspecified whether acute organ dysfunction present (HCC)  Pneumonia due to infectious organism, unspecified laterality, unspecified part of lung    Rx / DC Orders ED Discharge Orders     None         Terald Sleeper, MD 09/30/23 1511

## 2023-10-01 DIAGNOSIS — A419 Sepsis, unspecified organism: Secondary | ICD-10-CM | POA: Diagnosis not present

## 2023-10-01 DIAGNOSIS — J189 Pneumonia, unspecified organism: Secondary | ICD-10-CM | POA: Diagnosis not present

## 2023-10-01 LAB — BASIC METABOLIC PANEL
Anion gap: 10 (ref 5–15)
BUN: 26 mg/dL — ABNORMAL HIGH (ref 8–23)
CO2: 24 mmol/L (ref 22–32)
Calcium: 8.7 mg/dL — ABNORMAL LOW (ref 8.9–10.3)
Chloride: 105 mmol/L (ref 98–111)
Creatinine, Ser: 1.02 mg/dL — ABNORMAL HIGH (ref 0.44–1.00)
GFR, Estimated: 55 mL/min — ABNORMAL LOW (ref >60)
Glucose, Bld: 106 mg/dL — ABNORMAL HIGH (ref 70–99)
Potassium: 3.8 mmol/L (ref 3.5–5.1)
Sodium: 139 mmol/L (ref 135–145)

## 2023-10-01 LAB — CBC
HCT: 33.5 % — ABNORMAL LOW (ref 36.0–46.0)
Hemoglobin: 10.4 g/dL — ABNORMAL LOW (ref 12.0–15.0)
MCH: 30.1 pg (ref 26.0–34.0)
MCHC: 31 g/dL (ref 30.0–36.0)
MCV: 97.1 fL (ref 80.0–100.0)
Platelets: 142 10*3/uL — ABNORMAL LOW (ref 150–400)
RBC: 3.45 MIL/uL — ABNORMAL LOW (ref 3.87–5.11)
RDW: 14.6 % (ref 11.5–15.5)
WBC: 5.6 10*3/uL (ref 4.0–10.5)
nRBC: 0 % (ref 0.0–0.2)

## 2023-10-01 MED ORDER — ALPRAZOLAM 0.5 MG PO TABS
1.0000 mg | ORAL_TABLET | Freq: Three times a day (TID) | ORAL | Status: DC | PRN
  Administered 2023-10-01 – 2023-10-05 (×8): 1 mg via ORAL
  Filled 2023-10-01 (×9): qty 2

## 2023-10-01 MED ORDER — ACETAMINOPHEN-CODEINE 300-30 MG PO TABS
1.0000 | ORAL_TABLET | Freq: Four times a day (QID) | ORAL | Status: DC | PRN
  Administered 2023-10-01 – 2023-10-05 (×6): 1 via ORAL
  Filled 2023-10-01 (×6): qty 1

## 2023-10-01 MED ORDER — PREGABALIN 50 MG PO CAPS
50.0000 mg | ORAL_CAPSULE | Freq: Two times a day (BID) | ORAL | Status: DC
  Administered 2023-10-01 – 2023-10-05 (×9): 50 mg via ORAL
  Filled 2023-10-01 (×9): qty 1

## 2023-10-01 MED ORDER — MELATONIN 3 MG PO TABS
3.0000 mg | ORAL_TABLET | Freq: Every evening | ORAL | Status: DC | PRN
  Administered 2023-10-03: 3 mg via ORAL
  Filled 2023-10-01: qty 1

## 2023-10-01 MED ORDER — ACETAMINOPHEN-CODEINE 300-60 MG PO TABS: 1.0000 | ORAL_TABLET | Freq: Four times a day (QID) | ORAL | Status: DC | PRN

## 2023-10-01 NOTE — Evaluation (Signed)
 Occupational Therapy Evaluation Patient Details Name: Brittany Irwin MRN: [id number] DOB: 01-24-1941 Today's Date: 10/01/2023   History of Present Illness   Brittany Irwin is a 83 y.o. female with medical history significant for COPD chronically on 4 L nasal cannula oxygen, recent hospitalization for SBO and more recently on 09/05/23 for acute COVID-19 virus infection. Pt now being admitted to the hospital on 09/30/23 with sepsis due to healthcare acquired pneumonia.     Clinical Impressions Patient evaluated by Occupational Therapy with no further acute OT needs identified. All education has been completed and the patient has no further questions.  See below for any follow-up Occupational Therapy or equipment needs. OT is signing off. Pt agreeable. Thank you for this referral.      If plan is discharge home, recommend the following:   Assist for transportation;Assistance with cooking/housework     Functional Status Assessment   Patient has not had a recent decline in their functional status     Equipment Recommendations   Tub/shower seat;Other (comment) (long handled adaptive equipment)     Recommendations for Other Services   PT consult     Precautions/Restrictions   Precautions Precautions: Fall Precaution/Restrictions Comments: Severe back pain Restrictions Weight Bearing Restrictions Per Provider Order: No     Mobility Bed Mobility Overal bed mobility: Modified Independent                  Transfers                          Balance Overall balance assessment: Mild deficits observed, not formally tested             Standing balance comment: Tends to furniture cruise in room to offest back pain.                           ADL either performed or assessed with clinical judgement   ADL                                         General ADL Comments: Pt is able to complete BADL tasks with  set-up/SBA. VC may be needed for back pain management strategies.  Pt provided with education on use of long handled AE to limit forward spinal flexion. Pt educated on "BLT" for back pain management, as well as other Nonpharmacological pain management strategies such as contrasting cold and heat to back, stretching, use of either chriopractor or outpt PT in additional to pt's plans to visit a pain clinic. Pt also to follow up with her back doctor on whether she can use a TENS with her PPM.  pt agrees that she is able to do all her ADLs, but OT limited her LB ADLs this date due to pain. Pt showing enough ability to perform once pain is lessened.      Vision Baseline Vision/History: 1 Wears glasses Ability to See in Adequate Light: 0 Adequate Patient Visual Report: No change from baseline Vision Assessment?: Wears glasses for reading     Perception         Praxis         Pertinent Vitals/Pain Pain Assessment Pain Assessment: 0-10 Pain Score: 9  Faces Pain Scale: Hurts worst Pain Location: Mid back Pain Descriptors / Indicators:  ('I have no adjective for  this.") Pain Intervention(s): Ice applied, Premedicated before session, Repositioned, Relaxation, Limited activity within patient's tolerance (STM)     Extremity/Trunk Assessment Upper Extremity Assessment Upper Extremity Assessment: Overall WFL for tasks assessed (mmT not assess due to severe back pain.)           Communication Communication Communication: No apparent difficulties   Cognition Arousal: Alert Behavior During Therapy: WFL for tasks assessed/performed Cognition: No apparent impairments             OT - Cognition Comments: pleasant, Ox4. Distracted by back pain but otherwise no cognitive deficits. pt aware of recent ams and agrees that she is back to her baseline mentation.                 Following commands: Intact       Cueing  General Comments   Cueing Techniques: Verbal cues       Exercises Other Exercises Other Exercises: ot performed StM for several minutes on mid back and palpated mm tightness.  Performed supine pec stretch with rolled towl down spine and pt breathing and reported pain lessening. After this applied ice pack down spine while pt awaiting K-Pad for heating after coordinating with RN.   Shoulder Instructions      Home Living Family/patient expects to be discharged to:: Private residence Living Arrangements: Alone Available Help at Discharge: Family;Neighbor;Available PRN/intermittently Type of Home: Apartment Home Access: Level entry     Home Layout: One level     Bathroom Shower/Tub: Chief Strategy Officer: Standard     Home Equipment: Agricultural consultant (2 wheels);Rollator (4 wheels);Grab bars - tub/shower;Hand held shower head   Additional Comments: Pt reports plans for a shower chair.      Prior Functioning/Environment Prior Level of Function : Independent/Modified Independent;Driving             Mobility Comments: Uses rollator when she goes out, occasionally in home.  On 3-4 L O2.  Can drive but typically does not (does not have a car) ADLs Comments: Independent with ADL; typically orders groceries ; independent light IADL  Neice and sister will come by and assist with heavier housework as well.    OT Problem List: Decreased activity tolerance;Pain   OT Treatment/Interventions:        OT Goals(Current goals can be found in the care plan section)   Acute Rehab OT Goals OT Goal Formulation: All assessment and education complete, DC therapy ADL Goals Additional ADL Goal #1: Pt will identify Nonpharmacological strategies to safely address back pain in addition to her regular pain med regime.   OT Frequency:       Co-evaluation              AM-PAC OT "6 Clicks" Daily Activity     Outcome Measure Help from another person eating meals?: None Help from another person taking care of personal grooming?:  None Help from another person toileting, which includes using toliet, bedpan, or urinal?: None Help from another person bathing (including washing, rinsing, drying)?: None Help from another person to put on and taking off regular upper body clothing?: None Help from another person to put on and taking off regular lower body clothing?: None 6 Click Score: 24   End of Session Equipment Utilized During Treatment: Oxygen Nurse Communication: Patient requests pain meds;Other (comment) (Requested heating pad for pt)  Activity Tolerance: Patient limited by pain Patient left: in bed;with call bell/phone within reach;with nursing/sitter in room  OT Visit Diagnosis:  Pain Pain - part of body:  (mid-back)                Time: 1610-9604 OT Time Calculation (min): 43 min Charges:  OT General Charges $OT Visit: 1 Visit OT Evaluation $OT Eval Low Complexity: 1 Low OT Treatments $Self Care/Home Management : 8-22 mins $Massage: 8-22 mins  Victorino Dike, OT Acute Rehab Services Office: [phone] 10/01/2023   Theodoro Clock 10/01/2023, 11:11 AM

## 2023-10-01 NOTE — Progress Notes (Addendum)
 PROGRESS NOTE    Brittany Irwin  ONG:[id number] DOB: [birth year] DOA: 09/30/2023 PCP: Teodoro Spray, MD    Brief Narrative:   Brittany Irwin is a 83 y.o. 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  Patient's sister reported that she has been progressively short of breath, confused and coughing up thick yellow sputum.  Also endorses progressive weakness.  Patient denied any fevers or chills.  No chest pain.  Recently seen by her outpatient pulmonologist, Dr. Patricia Pesa who prescribed 7 days of Ceftin and a 4-day prednisone taper; patient reports did not take prednisone due to previous side effects.  In the ED, temperature 102.0 F, HR 100, RR 23, BP 164/66, SpO2 92% on 4 L nasal cannula.  WBC 10.2, hemoglobin 12.7, platelet count 194.  Sodium 140, potassium 4.9, chloride 98, CO2 29, glucose 103, BUN 33, Cram 1.39.  AST 31, ALT 9, total bilirubin 1.3.  Lactic acid 2.5.  COVID/influenza/RSV PCR negative.  Urinalysis unrevealing.  Chest x-ray with new approximately 1.2 x 1.5 cm nodular opacity right lateral costophrenic angle.  CT chest without contrast with peripheral airspace disease right middle lobe and both lower lobes, nodular in appearance likely infectious/inflammatory, but cannot exclude underlying malignancy, CAD, mildly enlarged mediastinal lymph nodes likely reactive.  Patient was started on empiric antibiotics.  TRH consulted for admission for further evaluation management of community-acquired pneumonia.  Assessment & Plan:   Sepsis secondary to community-acquired pneumonia, POA Patient presenting to the ED with confusion, shortness of  breath, productive cough of yellow sputum.  Patient was febrile with temperature 102.0, tachycardic, tachypneic.  WBC count 10.2, lactic acid 2.5.  Chest imaging concerning for pneumonia. -- WBC 10.2>5.6 -- Lactic acid 2.5>0.7 -- Ceftriaxone 2 g IV every 24 hours -- Supportive care, antipyretics -- Continue supplemental oxygen, maintain SpO2 greater than 88% (Baseline 3-4L [place]) -- Repeat CBC in a.m.  Acute renal failure on CKD stage IIIa -- Cr 1.34>1.02 -- Holding home Bumex and losartan -- Repeat BMP in a.m.  Chronic hypoxic respiratory failure  COPD Patient on home oxygen, baseline O2 needs 3-4 L per nasal cannula. On Trelegy Ellipta outpatient -- Breo Ellipta 1 puff daily -- Incruse Ellipta 1 puff daily -- Xopenex nebs every 4 hours as needed wheezing/shortness of breath  Anxiety -- Xanax 1 mg p.o. 3 times daily as needed anxiety  Chronic low back pain, history of spinal stenosis -- Tylenol No. 4 1 tablet every 6 hours as needed moderate pain -- Lyrica 50 mg p.o. twice daily -- Flexeril 5 mg p.o. twice daily as needed muscle spasm -- Kpad  Hyperlipidemia -- Zetia 10 mg p.o. daily -- Fenofibrate 160 mg p.o. daily  Essential hypertension -- Atenolol 12.5 m p.o. daily -- Holding home bumex and losartan given AKI as above -- Continue monitor BP closely  History of Crohn's disease Follows with gastroenterology outpatient, Dr. Aline August.  Currently not on any immunosuppressive medications.  History of QTc prolongation --Check EKG -- Monitor on telemetry   DVT prophylaxis: heparin injection 5,000 Units Start: 09/30/23 1400    Code Status: Full Code Family Communication: No family present at bedside this morning  Disposition Plan:  Level of care: Telemetry Status is: Inpatient  Remains inpatient appropriate because: IV antibiotics    Consultants:  None  Procedures:  None  Antimicrobials:  Ceftriaxone 3/13>>   Subjective: Patient seen examined bedside, sitting  at edge of bed.  Anxious.  Wanting her home Tylenol No. 4 and Xanax restarted.  Continues to endorse shortness of breath but remains on 3 L nasal cannula which is at her baseline.  No further coughing this morning.  No other specific complaints or concerns at this time.  Denies headache, no dizziness, no chest pain, no palpitations, no current fever, no chills, no night sweats, no nausea/vomiting/diarrhea, no focal weakness, no fatigue, no paresthesias.  No acute concerns overnight per nursing staff.  Objective: Vitals:   10/01/23 0005 10/01/23 0358 10/01/23 0849 10/01/23 1152  BP: (!) 118/52 121/63  (!) 154/65  Pulse: 61 60  72  Resp: 20 16  18   Temp: 97.7 F (36.5 C) 97.6 F (36.4 C)  98 F (36.7 C)  TempSrc: Oral Oral  Oral  SpO2: 94% 97% 96% 96%  Weight:      Height:        Intake/Output Summary (Last 24 hours) at 10/01/2023 1220 Last data filed at 09/30/2023 1855 Gross per 24 hour  Intake 1200.77 ml  Output --  Net 1200.77 ml   Filed Weights   09/30/23 0909 09/30/23 1647  Weight: 74 kg 71.9 kg    Examination:  Physical Exam: GEN: NAD, alert and oriented x 3, elderly in appearance HEENT: NCAT, PERRL, EOMI, sclera clear, MMM PULM: Breath slightly diminished bilateral bases, no wheezes/crackles, normal respiratory effort without accessory muscle use, on 3 L nasal cannula which is at her baseline CV: RRR w/o M/G/R GI: abd soft, NTND, + BS MSK: no peripheral edema, moves all extremities dependently NEURO: CN II-XII intact, no focal deficits, sensation to light touch intact PSYCH: normal mood/affect Integumentary: No concerning rashes/lesions/wounds noted on exposed skin surfaces    Data Reviewed: I have personally reviewed following labs and imaging studies  CBC: Recent Labs  Lab 09/30/23 1004 10/01/23 0404  WBC 10.2 5.6  NEUTROABS 8.5*  --   HGB 12.7 10.4*  HCT 42.0 33.5*  MCV 98.1 97.1  PLT 194 142*   Basic Metabolic Panel: Recent Labs  Lab 09/30/23 1004  10/01/23 0404  NA 140 139  K 4.9 3.8  CL 98 105  CO2 29 24  GLUCOSE 103* 106*  BUN 33* 26*  CREATININE 1.39* 1.02*  CALCIUM 9.6 8.7*   GFR: Estimated Creatinine Clearance: 42.1 mL/min (A) (by C-G formula based on SCr of 1.02 mg/dL (H)). Liver Function Tests: Recent Labs  Lab 09/30/23 1004  AST 31  ALT 9  ALKPHOS 34*  BILITOT 1.3*  PROT 8.0  ALBUMIN 4.0   No results for input(s): "LIPASE", "AMYLASE" in the last 168 hours. No results for input(s): "AMMONIA" in the last 168 hours. Coagulation Profile: Recent Labs  Lab 09/30/23 1004  INR 1.2   Cardiac Enzymes: No results for input(s): "CKTOTAL", "CKMB", "CKMBINDEX", "TROPONINI" in the last 168 hours. BNP (last 3 results) No results for input(s): "PROBNP" in the last 8760 hours. HbA1C: No results for input(s): "HGBA1C" in the last 72 hours. CBG: No results for input(s): "GLUCAP" in the last 168 hours. Lipid Profile: No results for input(s): "CHOL", "HDL", "LDLCALC", "TRIG", "CHOLHDL", "LDLDIRECT" in the last 72 hours. Thyroid Function Tests: No results for input(s): "TSH", "T4TOTAL", "FREET4", "T3FREE", "THYROIDAB" in the last 72 hours. Anemia Panel: No results for input(s): "VITAMINB12", "FOLATE", "FERRITIN", "TIBC", "  IRON", "RETICCTPCT" in the last 72 hours. Sepsis Labs: Recent Labs  Lab 09/30/23 1027 09/30/23 1657  LATICACIDVEN 2.5* 0.7    Recent Results (from the past 240 hours)  Resp panel by RT-PCR (RSV, Flu A&B, Covid) Anterior Nasal Swab     Status: None   Collection Time: 09/30/23  9:29 AM   Specimen: Anterior Nasal Swab  Result Value Ref Range Status   SARS Coronavirus 2 by RT PCR NEGATIVE NEGATIVE Final    Comment: (NOTE) SARS-CoV-2 target nucleic acids are NOT DETECTED.  The SARS-CoV-2 RNA is generally detectable in upper respiratory specimens during the acute phase of infection. The lowest concentration of SARS-CoV-2 viral copies this assay can detect is 138 copies/mL. A negative result does  not preclude SARS-Cov-2 infection and should not be used as the sole basis for treatment or other patient management decisions. A negative result may occur with  improper specimen collection/handling, submission of specimen other than nasopharyngeal swab, presence of viral mutation(s) within the areas targeted by this assay, and inadequate number of viral copies(<138 copies/mL). A negative result must be combined with clinical observations, patient history, and epidemiological information. The expected result is Negative.  Fact Sheet for Patients:  BloggerCourse.com  Fact Sheet for Healthcare Providers:  SeriousBroker.it  This test is no t yet approved or cleared by the [place] FDA and  has been authorized for detection and/or diagnosis of SARS-CoV-2 by FDA under an Emergency Use Authorization (EUA). This EUA will remain  in effect (meaning this test can be used) for the duration of the COVID-19 declaration under Section 564(b)(1) of the Act, 21 U.S.C.section 360bbb-3(b)(1), unless the authorization is terminated  or revoked sooner.       Influenza A by PCR NEGATIVE NEGATIVE Final   Influenza B by PCR NEGATIVE NEGATIVE Final    Comment: (NOTE) The Xpert Xpress SARS-CoV-2/FLU/RSV plus assay is intended as an aid in the diagnosis of influenza from Nasopharyngeal swab specimens and should not be used as a sole basis for treatment. Nasal washings and aspirates are unacceptable for Xpert Xpress SARS-CoV-2/FLU/RSV testing.  Fact Sheet for Patients: BloggerCourse.com  Fact Sheet for Healthcare Providers: SeriousBroker.it  This test is not yet approved or cleared by the [place] FDA and has been authorized for detection and/or diagnosis of SARS-CoV-2 by FDA under an Emergency Use Authorization (EUA). This EUA will remain in effect (meaning this test can be used) for the  duration of the COVID-19 declaration under Section 564(b)(1) of the Act, 21 U.S.C. section 360bbb-3(b)(1), unless the authorization is terminated or revoked.     Resp Syncytial Virus by PCR NEGATIVE NEGATIVE Final    Comment: (NOTE) Fact Sheet for Patients: BloggerCourse.com  Fact Sheet for Healthcare Providers: SeriousBroker.it  This test is not yet approved or cleared by the [place] FDA and has been authorized for detection and/or diagnosis of SARS-CoV-2 by FDA under an Emergency Use Authorization (EUA). This EUA will remain in effect (meaning this test can be used) for the duration of the COVID-19 declaration under Section 564(b)(1) of the Act, 21 U.S.C. section 360bbb-3(b)(1), unless the authorization is terminated or revoked.  Performed at [facility], [address] [place], [place] [address]   Blood Culture (routine x 2)     Status: None (Preliminary result)   Collection Time: 09/30/23  9:33 AM   Specimen: BLOOD  Result Value Ref Range Status   Specimen Description   Final    BLOOD SITE NOT SPECIFIED Performed at  [facility], [address] [place], [place] [address]    Special Requests   Final    BOTTLES DRAWN AEROBIC AND ANAEROBIC Blood Culture results may not be optimal due to an inadequate volume of blood received in culture bottles Performed at [facility], [address], [place], [place] [address]    Culture   Final    NO GROWTH < 24 HOURS Performed at [facility], [address], [place], [place] [address]    Report Status PENDING  Incomplete  Blood Culture (routine x 2)     Status: None (Preliminary result)   Collection Time: 09/30/23 10:04 AM   Specimen: BLOOD LEFT ARM  Result Value Ref Range Status   Specimen Description   Final    BLOOD LEFT ARM Performed at [facility], [address] [place], [place] [address]     Special Requests   Final    BOTTLES DRAWN AEROBIC AND ANAEROBIC Blood Culture results may not be optimal due to an inadequate volume of blood received in culture bottles Performed at [facility], [address], [place], [place] [address]    Culture   Final    NO GROWTH < 24 HOURS Performed at [facility], [address], [place], [place] [address]    Report Status PENDING  Incomplete         Radiology Studies: CT Chest Wo Contrast Result Date: 09/30/2023 CLINICAL DATA:  Pneumonia, complication suspected, xray done. Shortness of breath, cough. Abnormal chest x-ray. EXAM: CT CHEST WITHOUT CONTRAST TECHNIQUE: Multidetector CT imaging of the chest was performed following the standard protocol without IV contrast. RADIATION DOSE REDUCTION: This exam was performed according to the departmental dose-optimization program which includes automated exposure control, adjustment of the mA and/or kV according to patient size and/or use of iterative reconstruction technique. COMPARISON:  Chest x-ray today. FINDINGS: Cardiovascular: Left chest wall pacer in place with leads in the right atrium and right ventricle. Heart mildly enlarged. Coronary artery and aortic atherosclerosis. No evidence of aortic aneurysm. Mediastinum/Nodes: Prominent mediastinal lymph nodes. Right paratracheal lymph node has a short axis diameter of 1.3 cm. No axillary or visible hilar adenopathy. Lungs/Pleura: Moderate centrilobular emphysema. No pleural effusions. Peripheral nodular airspace opacities in the right lower lobe corresponding to the density seen on prior chest x-ray. There are nodular airspace opacities also posteriorly in the left lower lobe and within the right middle lobe. I favor this is infectious/inflammatory, possibly related to pneumonia. No effusions. Right middle lobe atelectasis noted. Upper Abdomen: No acute findings Musculoskeletal: Chest wall soft tissues are unremarkable. No acute bony  abnormality. IMPRESSION: Peripheral airspace disease in the right middle lobe and both lower lobes with somewhat nodular appearance in some areas, particularly right lower lobe. I favor this is likely infectious/inflammatory. Cannot exclude pneumonia. Followup chest CT is recommended in 3-4 weeks following trial of antibiotic therapy to ensure resolution and exclude underlying malignancy. Coronary artery disease. Mildly enlarged mediastinal lymph nodes, likely reactive. Recommend attention on follow-up imaging. Aortic Atherosclerosis (ICD10-I70.0) and Emphysema (ICD10-J43.9). Electronically Signed   By: Charlett Nose M.D.   On: 09/30/2023 16:08   DG Chest 2 View Result Date: 09/30/2023 CLINICAL DATA:  high clinical suspicion for PNA. Weakness. Shortness of breath. Cough. EXAM: CHEST - 2 VIEW COMPARISON:  09/05/2023. FINDINGS: Low lung volume. There is a new approximately 1.2 x 1.5 cm nodular opacity overlying the right lateral costophrenic angle region. The opacity  is not clearly seen on the lateral film. Further evaluation with chest CT scan is recommended. There are probable atelectatic changes in the retrocardiac region, right more than left. Bilateral lung fields are otherwise clear. Bilateral costophrenic angles are clear. Normal cardio-mediastinal silhouette. There is a left sided 2-lead pacemaker. No acute osseous abnormalities. The soft tissues are within normal limits. IMPRESSION: *There is a new approximately 1.2 x 1.5 cm nodular opacity overlying the right lateral costophrenic angle region. The opacity is not clearly seen on the lateral film. Further evaluation with chest CT scan is recommended. Electronically Signed   By: Jules Schick M.D.   On: 09/30/2023 11:47        Scheduled Meds:  atenolol  12.5 mg Oral Daily   ezetimibe  10 mg Oral QHS   fenofibrate  160 mg Oral Daily   fluticasone furoate-vilanterol  1 puff Inhalation Daily   And   umeclidinium bromide  1 puff Inhalation Daily    heparin  5,000 Units Subcutaneous Q8H   pantoprazole  40 mg Oral Daily   pregabalin  50 mg Oral BID   Continuous Infusions:  cefTRIAXone (ROCEPHIN)  IV Stopped (09/30/23 1549)     LOS: 1 day    Time spent: 52 minutes spent on 10/01/2023 caring for this patient face-to-face including chart review, ordering labs/tests, documenting, discussion with nursing staff, consultants, updating family and interview/physical exam    Alvira Philips [place], DO [facility] Available via Epic secure chat 7am-7pm After these hours, please refer to coverage provider listed on [email or web address] 10/01/2023, 12:20 PM

## 2023-10-01 NOTE — Evaluation (Signed)
 Physical Therapy One Time Evaluation Patient Details Name: Brittany Irwin MRN: [id number] DOB: 15-May-1941 Today's Date: 10/01/2023  History of Present Illness  Brittany Irwin is a 83 y.o. female admitted to the hospital on 09/30/23 with sepsis due to healthcare acquired pneumonia. Past medical history significant for COPD chronically on 4 L nasal cannula oxygen, CAD, aspiration PNA and recent hospitalization for SBO and more recently on 09/05/23 for acute COVID-19 virus infection  Clinical Impression  Patient evaluated by Physical Therapy with no further acute PT needs identified. All education has been completed and the patient has no further questions.  Pt ambulated in hallway and appears to be mobilizing at her baseline.  Pt typically uses rollator at home as needed. Pt reports 3L O2 at rest and increases to 4L for activity.  Pt reports hx of spinal stenosis limiting her standing activity due to pain. No further follow-up Physical Therapy or equipment needs. PT is signing off. Pt would benefit from ambulating around unit as tolerated with staff during this admission.  Thank you for this referral.         If plan is discharge home, recommend the following: Assistance with cooking/housework;Help with stairs or ramp for entrance   Can travel by private vehicle        Equipment Recommendations None recommended by PT  Recommendations for Other Services       Functional Status Assessment Patient has had a recent decline in their functional status and demonstrates the ability to make significant improvements in function in a reasonable and predictable amount of time.     Precautions / Restrictions Precautions Precautions: Fall Precaution/Restrictions Comments: Severe back pain - pt reports hx of spinal stenosis      Mobility  Bed Mobility Overal bed mobility: Modified Independent                  Transfers Overall transfer level: Modified independent                       Ambulation/Gait Ambulation/Gait assistance: Supervision Gait Distance (Feet): 400 Feet Assistive device: Rolling walker (2 wheels) Gait Pattern/deviations: Step-through pattern, Decreased stride length, Trunk flexed       General Gait Details: pt reports using 4L O2 [place] for activity at baseline so ambulated on 4L, SpO2 93%; back pain limiting (reports hx of spinal stenosis)  Stairs            Wheelchair Mobility     Tilt Bed    Modified Rankin (Stroke Patients Only)       Balance Overall balance assessment: No apparent balance deficits (not formally assessed) (denies any recent falls)                                           Pertinent Vitals/Pain Pain Assessment Pain Assessment: 0-10 Pain Score: 8  Pain Location: Mid left back Pain Descriptors / Indicators: Discomfort Pain Intervention(s): Repositioned, Monitored during session, Heat applied, Premedicated before session    Home Living Family/patient expects to be discharged to:: Private residence Living Arrangements: Alone Available Help at Discharge: Family;Neighbor;Available PRN/intermittently Type of Home: Apartment Home Access: Level entry       Home Layout: One level Home Equipment: Agricultural consultant (2 wheels);Rollator (4 wheels);Grab bars - tub/shower;Hand held shower head Additional Comments: Pt reports plans for a shower chair.    Prior  Function Prior Level of Function : Independent/Modified Independent;Driving             Mobility Comments: Uses rollator when she goes out, occasionally in home.  On 3-4 L O2.  Can drive but typically does not (does not have a car) ADLs Comments: Independent with ADL; typically orders groceries ; independent light IADL  Neice and sister will come by and assist with heavier housework as well.     Extremity/Trunk Assessment   Upper Extremity Assessment Upper Extremity Assessment: Overall WFL for tasks assessed (mmT not assess due to  severe back pain.)    Lower Extremity Assessment Lower Extremity Assessment: Overall WFL for tasks assessed    Cervical / Trunk Assessment Cervical / Trunk Assessment: Kyphotic  Communication   Communication Communication: No apparent difficulties    Cognition Arousal: Alert Behavior During Therapy: WFL for tasks assessed/performed   PT - Cognitive impairments: No apparent impairments                         Following commands: Intact       Cueing       General Comments      Exercises     Assessment/Plan    PT Assessment Patient does not need any further PT services  PT Problem List         PT Treatment Interventions      PT Goals (Current goals can be found in the Care Plan section)  Acute Rehab PT Goals PT Goal Formulation: All assessment and education complete, DC therapy    Frequency       Co-evaluation               AM-PAC PT "6 Clicks" Mobility  Outcome Measure Help needed turning from your back to your side while in a flat bed without using bedrails?: None Help needed moving from lying on your back to sitting on the side of a flat bed without using bedrails?: None Help needed moving to and from a bed to a chair (including a wheelchair)?: None Help needed standing up from a chair using your arms (e.g., wheelchair or bedside chair)?: None Help needed to walk in hospital room?: A Little Help needed climbing 3-5 steps with a railing? : A Little 6 Click Score: 22    End of Session Equipment Utilized During Treatment: Gait belt Activity Tolerance: Patient tolerated treatment well Patient left: in bed;with call bell/phone within reach   PT Visit Diagnosis: Difficulty in walking, not elsewhere classified (R26.2)    Time: 1308-6578 PT Time Calculation (min) (ACUTE ONLY): 12 min   Charges:   PT Evaluation $PT Eval Low Complexity: 1 Low   PT General Charges $$ ACUTE PT VISIT: 1 Visit       Thomasene Mohair PT, DPT Physical  Therapist Acute Rehabilitation Services Office: [phone]   Janan Halter [place] 10/01/2023, 12:47 PM

## 2023-10-02 DIAGNOSIS — A419 Sepsis, unspecified organism: Secondary | ICD-10-CM | POA: Diagnosis not present

## 2023-10-02 DIAGNOSIS — J189 Pneumonia, unspecified organism: Secondary | ICD-10-CM | POA: Diagnosis not present

## 2023-10-02 LAB — BASIC METABOLIC PANEL
Anion gap: 7 (ref 5–15)
BUN: 23 mg/dL (ref 8–23)
CO2: 24 mmol/L (ref 22–32)
Calcium: 8.6 mg/dL — ABNORMAL LOW (ref 8.9–10.3)
Chloride: 108 mmol/L (ref 98–111)
Creatinine, Ser: 0.88 mg/dL (ref 0.44–1.00)
GFR, Estimated: >60 mL/min (ref >60)
Glucose, Bld: 104 mg/dL — ABNORMAL HIGH (ref 70–99)
Potassium: 3.9 mmol/L (ref 3.5–5.1)
Sodium: 139 mmol/L (ref 135–145)

## 2023-10-02 LAB — CBC
HCT: 33.9 % — ABNORMAL LOW (ref 36.0–46.0)
Hemoglobin: 10.3 g/dL — ABNORMAL LOW (ref 12.0–15.0)
MCH: 29.7 pg (ref 26.0–34.0)
MCHC: 30.4 g/dL (ref 30.0–36.0)
MCV: 97.7 fL (ref 80.0–100.0)
Platelets: 145 10*3/uL — ABNORMAL LOW (ref 150–400)
RBC: 3.47 MIL/uL — ABNORMAL LOW (ref 3.87–5.11)
RDW: 14.5 % (ref 11.5–15.5)
WBC: 3.8 10*3/uL — ABNORMAL LOW (ref 4.0–10.5)
nRBC: 0 % (ref 0.0–0.2)

## 2023-10-02 MED ORDER — HYDROMORPHONE HCL 1 MG/ML IJ SOLN
0.5000 mg | INTRAMUSCULAR | Status: DC | PRN
  Administered 2023-10-02 – 2023-10-05 (×8): 0.5 mg via INTRAVENOUS
  Filled 2023-10-02 (×8): qty 0.5

## 2023-10-02 NOTE — Plan of Care (Signed)

## 2023-10-02 NOTE — Progress Notes (Signed)
 Mobility Specialist - Progress Note  Pre-mobility: 64 bpm HR, 95% SpO2 ([place] 3L) During mobility: 75 bpm HR, 93% SpO2 ([place] 4L)   10/02/23 1101  Mobility  Activity Ambulated with assistance in hallway;Ambulated with assistance to bathroom  Level of Assistance Standby assist, set-up cues, supervision of patient - no hands on  Assistive Device Front wheel walker  Distance Ambulated (ft) 350 ft  Range of Motion/Exercises Active  Activity Response Tolerated well  Mobility Referral Yes  Mobility visit 1 Mobility  Mobility Specialist Start Time (ACUTE ONLY) 1040  Mobility Specialist Stop Time (ACUTE ONLY) 1100  Mobility Specialist Time Calculation (min) (ACUTE ONLY) 20 min   Pt was found on recliner chair and agreeable to ambulate. No complaints with session. At EOS returned to use bathroom and afterwards returned to recliner chair with all needs met. Call bell in reach.  Billey Chang Mobility Specialist

## 2023-10-02 NOTE — Progress Notes (Signed)
 PROGRESS NOTE    Brittany Irwin  WUJ:[id number] DOB: [birth year] DOA: 09/30/2023 PCP: Teodoro Spray, MD    Brief Narrative:   Brittany Irwin is a 83 y.o. 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  Patient's sister reported that she has been progressively short of breath, confused and coughing up thick yellow sputum.  Also endorses progressive weakness.  Patient denied any fevers or chills.  No chest pain.  Recently seen by her outpatient pulmonologist, Dr. Patricia Pesa who prescribed 7 days of Ceftin and a 4-day prednisone taper; patient reports did not take prednisone due to previous side effects.  In the ED, temperature 102.0 F, HR 100, RR 23, BP 164/66, SpO2 92% on 4 L nasal cannula.  WBC 10.2, hemoglobin 12.7, platelet count 194.  Sodium 140, potassium 4.9, chloride 98, CO2 29, glucose 103, BUN 33, Cram 1.39.  AST 31, ALT 9, total bilirubin 1.3.  Lactic acid 2.5.  COVID/influenza/RSV PCR negative.  Urinalysis unrevealing.  Chest x-ray with new approximately 1.2 x 1.5 cm nodular opacity right lateral costophrenic angle.  CT chest without contrast with peripheral airspace disease right middle lobe and both lower lobes, nodular in appearance likely infectious/inflammatory, but cannot exclude underlying malignancy, CAD, mildly enlarged mediastinal lymph nodes likely reactive.  Patient was started on empiric antibiotics.  TRH consulted for admission for further evaluation management of community-acquired pneumonia.  Assessment & Plan:   Sepsis secondary to community-acquired pneumonia, POA Patient presenting to the ED with confusion, shortness of  breath, productive cough of yellow sputum.  Patient was febrile with temperature 102.0, tachycardic, tachypneic.  WBC count 10.2, lactic acid 2.5.  Chest imaging concerning for pneumonia. -- WBC 10.2>5.6>3.8 -- Lactic acid 2.5>0.7 -- Ceftriaxone 2 g IV every 24 hours -- Supportive care, antipyretics -- Continue supplemental oxygen, maintain SpO2 greater than 88% (Baseline 3-4L [place]) -- Repeat CT chest without contrast in 3-4 weeks to exclude underlying malignancy  Acute renal failure on CKD stage IIIa -- Cr 1.34>1.02>0.88 -- Holding home Bumex and losartan -- Repeat BMP in a.m.  Chronic hypoxic respiratory failure  COPD Patient on home oxygen, baseline O2 needs 3-4 L per nasal cannula. On Trelegy Ellipta outpatient -- Breo Ellipta 1 puff daily -- Incruse Ellipta 1 puff daily -- Xopenex nebs every 4 hours as needed wheezing/shortness of breath  Anxiety -- Xanax 1 mg p.o. 3 times daily as needed anxiety  Chronic low back pain, history of spinal stenosis -- Tylenol No. 4 1 tablet every 6 hours as needed moderate pain -- Lyrica 50 mg p.o. twice daily -- Flexeril 5 mg p.o. twice daily as needed muscle spasm -- Dilaudid 0.5 mg IV every 3 hours as needed severe pain -- Kpad  Hyperlipidemia -- Zetia 10 mg p.o. daily -- Fenofibrate 160 mg p.o. daily  Essential hypertension -- Atenolol 12.5 m p.o. daily -- Holding home bumex and losartan given AKI as above -- Continue monitor BP closely  History of Crohn's disease Follows with gastroenterology outpatient, Dr. Aline August.  Currently not on any immunosuppressive medications.  History of QTc prolongation --Check EKG -- Monitor on telemetry   DVT prophylaxis: heparin injection 5,000 Units Start: 09/30/23 1400    Code Status: Full Code  Family Communication: No family present at bedside this morning  Disposition Plan:  Level of care: Telemetry Status is: Inpatient Remains inpatient appropriate because: IV antibiotics     Consultants:  None  Procedures:  None  Antimicrobials:  Ceftriaxone 3/13>>   Subjective: Patient seen examined bedside, sitting at edge of bed.  Complaining of low back pain due to her spinal stenosis.  Breathing improved, remains on IV ceftriaxone.  No other specific complaints or concerns at this time.  Denies headache, no dizziness, no chest pain, no palpitations, no current fever, no chills, no night sweats, no nausea/vomiting/diarrhea, no focal weakness, no fatigue, no paresthesias.  No acute concerns overnight per nursing staff.  Objective: Vitals:   10/01/23 1152 10/01/23 2029 10/02/23 0536 10/02/23 1018  BP: (!) 154/65 121/62 127/62   Pulse: 72 62 62   Resp: 18 19    Temp: 98 F (36.7 C) 97.6 F (36.4 C) 97.6 F (36.4 C)   TempSrc: Oral  Oral   SpO2: 96% 96% 98% 98%  Weight:      Height:        Intake/Output Summary (Last 24 hours) at 10/02/2023 1229 Last data filed at 10/01/2023 1857 Gross per 24 hour  Intake 120 ml  Output --  Net 120 ml   Filed Weights   09/30/23 0909 09/30/23 1647  Weight: 74 kg 71.9 kg    Examination:  Physical Exam: GEN: NAD, alert and oriented x 3, elderly in appearance HEENT: NCAT, PERRL, EOMI, sclera clear, MMM PULM: Breath slightly diminished bilateral bases, no wheezes/crackles, normal respiratory effort without accessory muscle use, on 3 L nasal cannula which is at her baseline CV: RRR w/o M/G/R GI: abd soft, NTND, + BS MSK: no peripheral edema, moves all extremities dependently NEURO: CN II-XII intact, no focal deficits, sensation to light touch intact PSYCH: normal mood/affect Integumentary: No concerning rashes/lesions/wounds noted on exposed skin surfaces    Data Reviewed: I have personally reviewed following labs and imaging studies  CBC: Recent Labs  Lab 09/30/23 1004 10/01/23 0404 10/02/23 0417  WBC 10.2 5.6 3.8*  NEUTROABS 8.5*  --   --   HGB 12.7 10.4* 10.3*  HCT 42.0 33.5* 33.9*  MCV 98.1 97.1 97.7   PLT 194 142* 145*   Basic Metabolic Panel: Recent Labs  Lab 09/30/23 1004 10/01/23 0404 10/02/23 0417  NA 140 139 139  K 4.9 3.8 3.9  CL 98 105 108  CO2 29 24 24   GLUCOSE 103* 106* 104*  BUN 33* 26* 23  CREATININE 1.39* 1.02* 0.88  CALCIUM 9.6 8.7* 8.6*   GFR: Estimated Creatinine Clearance: 48.8 mL/min (by C-G formula based on SCr of 0.88 mg/dL). Liver Function Tests: Recent Labs  Lab 09/30/23 1004  AST 31  ALT 9  ALKPHOS 34*  BILITOT 1.3*  PROT 8.0  ALBUMIN 4.0   No results for input(s): "LIPASE", "AMYLASE" in the last 168 hours. No results for input(s): "AMMONIA" in the last 168 hours. Coagulation Profile: Recent Labs  Lab 09/30/23 1004  INR 1.2   Cardiac Enzymes: No results for input(s): "CKTOTAL", "CKMB", "CKMBINDEX", "TROPONINI" in the last 168 hours. BNP (last 3 results) No results for input(s): "PROBNP" in the last 8760 hours. HbA1C: No results for input(s): "HGBA1C" in the last 72 hours. CBG: No results for input(s): "GLUCAP" in the last 168 hours. Lipid Profile: No results for input(s): "CHOL", "HDL", "LDLCALC", "TRIG", "CHOLHDL", "LDLDIRECT" in the last 72 hours. Thyroid Function Tests: No results for input(s): "TSH", "T4TOTAL", "  FREET4", "T3FREE", "THYROIDAB" in the last 72 hours. Anemia Panel: No results for input(s): "VITAMINB12", "FOLATE", "FERRITIN", "TIBC", "IRON", "RETICCTPCT" in the last 72 hours. Sepsis Labs: Recent Labs  Lab 09/30/23 1027 09/30/23 1657  LATICACIDVEN 2.5* 0.7    Recent Results (from the past 240 hours)  Resp panel by RT-PCR (RSV, Flu A&B, Covid) Anterior Nasal Swab     Status: None   Collection Time: 09/30/23  9:29 AM   Specimen: Anterior Nasal Swab  Result Value Ref Range Status   SARS Coronavirus 2 by RT PCR NEGATIVE NEGATIVE Final    Comment: (NOTE) SARS-CoV-2 target nucleic acids are NOT DETECTED.  The SARS-CoV-2 RNA is generally detectable in upper respiratory specimens during the acute phase of  infection. The lowest concentration of SARS-CoV-2 viral copies this assay can detect is 138 copies/mL. A negative result does not preclude SARS-Cov-2 infection and should not be used as the sole basis for treatment or other patient management decisions. A negative result may occur with  improper specimen collection/handling, submission of specimen other than nasopharyngeal swab, presence of viral mutation(s) within the areas targeted by this assay, and inadequate number of viral copies(<138 copies/mL). A negative result must be combined with clinical observations, patient history, and epidemiological information. The expected result is Negative.  Fact Sheet for Patients:  BloggerCourse.com  Fact Sheet for Healthcare Providers:  SeriousBroker.it  This test is no t yet approved or cleared by the [place] FDA and  has been authorized for detection and/or diagnosis of SARS-CoV-2 by FDA under an Emergency Use Authorization (EUA). This EUA will remain  in effect (meaning this test can be used) for the duration of the COVID-19 declaration under Section 564(b)(1) of the Act, 21 U.S.C.section 360bbb-3(b)(1), unless the authorization is terminated  or revoked sooner.       Influenza A by PCR NEGATIVE NEGATIVE Final   Influenza B by PCR NEGATIVE NEGATIVE Final    Comment: (NOTE) The Xpert Xpress SARS-CoV-2/FLU/RSV plus assay is intended as an aid in the diagnosis of influenza from Nasopharyngeal swab specimens and should not be used as a sole basis for treatment. Nasal washings and aspirates are unacceptable for Xpert Xpress SARS-CoV-2/FLU/RSV testing.  Fact Sheet for Patients: BloggerCourse.com  Fact Sheet for Healthcare Providers: SeriousBroker.it  This test is not yet approved or cleared by the [place] FDA and has been authorized for detection and/or diagnosis of SARS-CoV-2  by FDA under an Emergency Use Authorization (EUA). This EUA will remain in effect (meaning this test can be used) for the duration of the COVID-19 declaration under Section 564(b)(1) of the Act, 21 U.S.C. section 360bbb-3(b)(1), unless the authorization is terminated or revoked.     Resp Syncytial Virus by PCR NEGATIVE NEGATIVE Final    Comment: (NOTE) Fact Sheet for Patients: BloggerCourse.com  Fact Sheet for Healthcare Providers: SeriousBroker.it  This test is not yet approved or cleared by the [place] FDA and has been authorized for detection and/or diagnosis of SARS-CoV-2 by FDA under an Emergency Use Authorization (EUA). This EUA will remain in effect (meaning this test can be used) for the duration of the COVID-19 declaration under Section 564(b)(1) of the Act, 21 U.S.C. section 360bbb-3(b)(1), unless the authorization is terminated or revoked.  Performed at [facility], [address], [place], [place] [address]   Blood Culture (routine x 2)     Status: None (Preliminary result)   Collection Time: 09/30/23  9:33 AM   Specimen: BLOOD  Result Value Ref  Range Status   Specimen Description   Final    BLOOD SITE NOT SPECIFIED Performed at [facility], [address], [place], [place] [address]    Special Requests   Final    BOTTLES DRAWN AEROBIC AND ANAEROBIC Blood Culture results may not be optimal due to an inadequate volume of blood received in culture bottles Performed at [facility], [address] [place], [place] [address]    Culture   Final    NO GROWTH 2 DAYS Performed at [facility], [address], [place], [place] [address]    Report Status PENDING  Incomplete  Blood Culture (routine x 2)     Status: None (Preliminary result)   Collection Time: 09/30/23 10:04 AM   Specimen: BLOOD LEFT ARM  Result Value Ref Range Status   Specimen  Description   Final    BLOOD LEFT ARM Performed at [facility], [address] [place], [place] [address]    Special Requests   Final    BOTTLES DRAWN AEROBIC AND ANAEROBIC Blood Culture results may not be optimal due to an inadequate volume of blood received in culture bottles Performed at [facility], [address], [place], [place] [address]    Culture   Final    NO GROWTH 2 DAYS Performed at [facility], [address] [place], [place] [address]    Report Status PENDING  Incomplete         Radiology Studies: CT Chest Wo Contrast Result Date: 09/30/2023 CLINICAL DATA:  Pneumonia, complication suspected, xray done. Shortness of breath, cough. Abnormal chest x-ray. EXAM: CT CHEST WITHOUT CONTRAST TECHNIQUE: Multidetector CT imaging of the chest was performed following the standard protocol without IV contrast. RADIATION DOSE REDUCTION: This exam was performed according to the departmental dose-optimization program which includes automated exposure control, adjustment of the mA and/or kV according to patient size and/or use of iterative reconstruction technique. COMPARISON:  Chest x-ray today. FINDINGS: Cardiovascular: Left chest wall pacer in place with leads in the right atrium and right ventricle. Heart mildly enlarged. Coronary artery and aortic atherosclerosis. No evidence of aortic aneurysm. Mediastinum/Nodes: Prominent mediastinal lymph nodes. Right paratracheal lymph node has a short axis diameter of 1.3 cm. No axillary or visible hilar adenopathy. Lungs/Pleura: Moderate centrilobular emphysema. No pleural effusions. Peripheral nodular airspace opacities in the right lower lobe corresponding to the density seen on prior chest x-ray. There are nodular airspace opacities also posteriorly in the left lower lobe and within the right middle lobe. I favor this is infectious/inflammatory, possibly related to pneumonia. No effusions. Right middle lobe  atelectasis noted. Upper Abdomen: No acute findings Musculoskeletal: Chest wall soft tissues are unremarkable. No acute bony abnormality. IMPRESSION: Peripheral airspace disease in the right middle lobe and both lower lobes with somewhat nodular appearance in some areas, particularly right lower lobe. I favor this is likely infectious/inflammatory. Cannot exclude pneumonia. Followup chest CT is recommended in 3-4 weeks following trial of antibiotic therapy to ensure resolution and exclude underlying malignancy. Coronary artery disease. Mildly enlarged mediastinal lymph nodes, likely reactive. Recommend attention on follow-up imaging. Aortic Atherosclerosis (ICD10-I70.0) and Emphysema (ICD10-J43.9). Electronically Signed   By: Charlett Nose M.D.   On: 09/30/2023 16:08        Scheduled Meds:  atenolol  12.5 mg Oral Daily   ezetimibe  10 mg Oral QHS   fenofibrate  160 mg Oral Daily   fluticasone furoate-vilanterol  1  puff Inhalation Daily   And   umeclidinium bromide  1 puff Inhalation Daily   heparin  5,000 Units Subcutaneous Q8H   pantoprazole  40 mg Oral Daily   pregabalin  50 mg Oral BID   Continuous Infusions:  cefTRIAXone (ROCEPHIN)  IV 2 g (10/01/23 1514)     LOS: 2 days    Time spent: 52 minutes spent on 10/02/2023 caring for this patient face-to-face including chart review, ordering labs/tests, documenting, discussion with nursing staff, consultants, updating family and interview/physical exam    Alvira Philips [place], DO [facility] Available via Epic secure chat 7am-7pm After these hours, please refer to coverage provider listed on [email or web address] 10/02/2023, 12:29 PM

## 2023-10-02 NOTE — TOC Initial Note (Signed)
 Transition of Care [facility]) - Initial/Assessment Note   Patient Details  Name: Brittany Irwin MRN: [id number] Date of Birth: [birth year]  Transition of Care [facility]) CM/SW Contact:    Brittany Schlein, LCSW Phone Number: 10/02/2023, 2:37 PM  Clinical Narrative: [facility] consulted for OPPT referral. CSW met with patient to discuss OPPT referral. Patient reported she wanted HHPT to address her back pain. CSW explained OPPT was not recommended by PT. Of note, patient walked 42ft with supervision with PT on 10/01/23 and 328ft with mobility today. Patient agreeable to CSW making OPPT referral at [facility] clinic close to where she lives as patient's sister, Brittany Irwin, drives her to appointments. OPPT referral made.                 Expected Discharge Plan: OP Rehab Barriers to Discharge: Continued Medical Work up  Patient Goals and CMS Choice Patient states their goals for this hospitalization and ongoing recovery are:: Have less back pain Choice offered to / list presented to : NA  Expected Discharge Plan and Services In-house Referral: Clinical Social Work Post Acute Care Choice: NA Living arrangements for the past 2 months: Apartment            DME Arranged: N/A DME Agency: NA  Prior Living Arrangements/Services Living arrangements for the past 2 months: Apartment Lives with:: Self Patient language and need for interpreter reviewed:: Yes Do you feel safe going back to the place where you live?: Yes      Need for Family Participation in Patient Care: No (Comment) Care giver support system in place?: Yes (comment) Criminal Activity/Legal Involvement Pertinent to Current Situation/Hospitalization: No - Comment as needed  Activities of Daily Living ADL Screening (condition at time of admission) Independently performs ADLs?: Yes (appropriate for developmental age) Is the patient deaf or have difficulty hearing?: No Does the patient have difficulty seeing, even when wearing glasses/contacts?:  No Does the patient have difficulty concentrating, remembering, or making decisions?: No  Permission Sought/Granted Permission sought to share information with : Facility Industrial/product designer granted to share information with : Yes, Verbal Permission Granted Permission granted to share info w AGENCY: [facility] clinic  Emotional Assessment Appearance:: Appears stated age Attitude/Demeanor/Rapport: Engaged Affect (typically observed): Accepting Orientation: : Oriented to Self, Oriented to Place, Oriented to  Time, Oriented to Situation Alcohol / Substance Use: Not Applicable Psych Involvement: No (comment)  Admission diagnosis:  Sepsis due to pneumonia (HCC) [J18.9, A41.9] Pneumonia due to infectious organism, unspecified laterality, unspecified part of lung [J18.9] Sepsis, due to unspecified organism, unspecified whether acute organ dysfunction present [facility]) [A41.9] Patient Active Problem List   Diagnosis Date Noted   Sepsis due to pneumonia (HCC) 09/30/2023   COVID 09/06/2023   COVID-19 virus infection 09/05/2023   Sepsis without acute organ dysfunction (HCC) 09/05/2023   Acute on chronic hypoxic respiratory failure (HCC) 09/05/2023   Dehydration 09/05/2023   Hypotension due to hypovolemia 09/05/2023   SBO (small bowel obstruction) (HCC) 08/11/2023   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   Presence of permanent cardiac pacemaker 08/11/2023   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 [place] [address] Phone: [phone] Fax: [phone]  Brittany Irwin - [facility] [address] [place] [address] Phone: [phone] Fax: [phone]  Social Drivers of Health (SDOH) Social History: SDOH Screenings   Food Insecurity: No Food Insecurity (09/30/2023)  Housing: Low Risk  (09/30/2023)  Transportation Needs: No Transportation Needs (09/30/2023)  Utilities: Not At Risk (09/30/2023)  Financial Resource Strain: Medium Risk (07/27/2023)   Received from [facility]  Physical Activity: Insufficiently Active (07/27/2023)   Received from [facility]  Social Connections: Unknown (09/30/2023)  Stress: No Stress Concern Present (07/27/2023)   Received from [facility]  Tobacco Use: Medium Risk (09/30/2023)   SDOH Interventions:    Readmission Risk Interventions    10/02/2023    2:35 PM 09/07/2023    3:27 PM  Readmission Risk Prevention Plan  Transportation Screening Complete Complete  PCP or Specialist Appt within 3-5 Days  Complete  HRI or Home Care Consult Complete Complete  Social Work Consult for Recovery Care Planning/Counseling Complete Complete  Palliative Care Screening Not Applicable Not Applicable  Medication Review Oceanographer) Complete Complete

## 2023-10-03 DIAGNOSIS — A419 Sepsis, unspecified organism: Secondary | ICD-10-CM | POA: Diagnosis not present

## 2023-10-03 DIAGNOSIS — J189 Pneumonia, unspecified organism: Secondary | ICD-10-CM | POA: Diagnosis not present

## 2023-10-03 NOTE — Progress Notes (Signed)
 PROGRESS NOTE    Brittany Irwin  BJY:[id number] DOB: 26-Jan-1941 DOA: 09/30/2023 PCP: Teodoro Spray, MD    Brief Narrative:   Brittany Irwin is a 83 y.o. 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  Patient's sister reported that she has been progressively short of breath, confused and coughing up thick yellow sputum.  Also endorses progressive weakness.  Patient denied any fevers or chills.  No chest pain.  Recently seen by her outpatient pulmonologist, Dr. Patricia Pesa who prescribed 7 days of Ceftin and a 4-day prednisone taper; patient reports did not take prednisone due to previous side effects.  In the ED, temperature 102.0 F, HR 100, RR 23, BP 164/66, SpO2 92% on 4 L nasal cannula.  WBC 10.2, hemoglobin 12.7, platelet count 194.  Sodium 140, potassium 4.9, chloride 98, CO2 29, glucose 103, BUN 33, Cram 1.39.  AST 31, ALT 9, total bilirubin 1.3.  Lactic acid 2.5.  COVID/influenza/RSV PCR negative.  Urinalysis unrevealing.  Chest x-ray with new approximately 1.2 x 1.5 cm nodular opacity right lateral costophrenic angle.  CT chest without contrast with peripheral airspace disease right middle lobe and both lower lobes, nodular in appearance likely infectious/inflammatory, but cannot exclude underlying malignancy, CAD, mildly enlarged mediastinal lymph nodes likely reactive.  Patient was started on empiric antibiotics.  TRH consulted for admission for further evaluation management of community-acquired pneumonia.  Assessment & Plan:   Sepsis secondary to community-acquired pneumonia, POA Patient presenting to the ED with confusion, shortness of  breath, productive cough of yellow sputum.  Patient was febrile with temperature 102.0, tachycardic, tachypneic.  WBC count 10.2, lactic acid 2.5.  Chest imaging concerning for pneumonia. -- WBC 10.2>5.6>3.8 -- Lactic acid 2.5>0.7 -- Ceftriaxone 2 g IV every 24 hours x 5 days -- Supportive care, antipyretics -- Continue supplemental oxygen, maintain SpO2 greater than 88% (Baseline 3-4L [place]) -- Repeat CT chest without contrast in 3-4 weeks to exclude underlying malignancy  Acute renal failure on CKD stage IIIa -- Cr 1.34>1.02>0.88 -- Holding home Bumex and losartan -- Repeat BMP in a.m.  Chronic hypoxic respiratory failure  COPD Patient on home oxygen, baseline O2 needs 3-4 L per nasal cannula. On Trelegy Ellipta outpatient -- Breo Ellipta 1 puff daily -- Incruse Ellipta 1 puff daily -- Xopenex nebs every 4 hours as needed wheezing/shortness of breath  Anxiety -- Xanax 1 mg p.o. 3 times daily as needed anxiety  Chronic low back pain, history of spinal stenosis -- Tylenol No. 4 1 tablet every 6 hours as needed moderate pain -- Lyrica 50 mg p.o. twice daily -- Flexeril 5 mg p.o. twice daily as needed muscle spasm -- Dilaudid 0.5 mg IV every 3 hours as needed severe pain -- Kpad -- Recommend outpatient follow-up with PCP for referral to pain management for consideration of steroid injection  Hyperlipidemia -- Zetia 10 mg p.o. daily -- Fenofibrate 160 mg p.o. daily  Essential hypertension -- Atenolol 12.5 m p.o. daily -- Holding home bumex and losartan given AKI as above -- Continue monitor BP closely  History of Crohn's disease Follows with gastroenterology outpatient, Dr. Aline August.  Currently not on any immunosuppressive medications.  History of QTc prolongation -- Monitor on telemetry  DVT prophylaxis: heparin injection 5,000 Units Start: 09/30/23 1400    Code Status: Full Code Family Communication: No family present at bedside this morning  Disposition Plan:  Level  of care: Telemetry Status is: Inpatient Remains inpatient appropriate because: IV antibiotics, anticipate discharge home tomorrow    Consultants:  None  Procedures:  None  Antimicrobials:  Ceftriaxone 3/13>>   Subjective: Patient seen examined bedside, lying in bed sleeping.  Easily arousable.  No specific complaints this morning other than her chronic back pain.  Long discussion with patient's regarding consideration of steroid injection outpatient that likely would have beneficial treatment to her underlying low back pain and chronic spinal stenosis.  Patient hesitant due to history of adverse reactions to oral steroids, discussed with her that localized injection of steroids would not have the same effect. No other specific complaints or concerns at this time.  Denies headache, no dizziness, no chest pain, no palpitations, no current fever, no chills, no night sweats, no nausea/vomiting/diarrhea, no focal weakness, no fatigue, no paresthesias.  No acute concerns overnight per nursing staff.  Objective: Vitals:   10/03/23 0404 10/03/23 1027 10/03/23 1106 10/03/23 1142  BP: (!) 145/59  (!) 145/74 118/66  Pulse: 63  73 66  Resp:   18   Temp: 98.8 F (37.1 C)  97.9 F (36.6 C) 97.8 F (36.6 C)  TempSrc: Oral  Oral Oral  SpO2: 97% 91% 94% 91%  Weight:      Height:        Intake/Output Summary (Last 24 hours) at 10/03/2023 1226 Last data filed at 10/03/2023 1000 Gross per 24 hour  Intake 600 ml  Output --  Net 600 ml   Filed Weights   09/30/23 0909 09/30/23 1647  Weight: 74 kg 71.9 kg    Examination:  Physical Exam: GEN: NAD, alert and oriented x 3, elderly in appearance HEENT: NCAT, PERRL, EOMI, sclera clear, MMM PULM: Breath slightly diminished bilateral bases, no wheezes/crackles, normal respiratory effort without accessory muscle use, on 3 L nasal cannula which is at her baseline CV: RRR w/o M/G/R GI: abd soft, NTND, + BS MSK: no peripheral edema, moves all  extremities dependently NEURO: CN II-XII intact, no focal deficits, sensation to light touch intact PSYCH: normal mood/affect Integumentary: No concerning rashes/lesions/wounds noted on exposed skin surfaces    Data Reviewed: I have personally reviewed following labs and imaging studies  CBC: Recent Labs  Lab 09/30/23 1004 10/01/23 0404 10/02/23 0417  WBC 10.2 5.6 3.8*  NEUTROABS 8.5*  --   --   HGB 12.7 10.4* 10.3*  HCT 42.0 33.5* 33.9*  MCV 98.1 97.1 97.7  PLT 194 142* 145*   Basic Metabolic Panel: Recent Labs  Lab 09/30/23 1004 10/01/23 0404 10/02/23 0417  NA 140 139 139  K 4.9 3.8 3.9  CL 98 105 108  CO2 29 24 24   GLUCOSE 103* 106* 104*  BUN 33* 26* 23  CREATININE 1.39* 1.02* 0.88  CALCIUM 9.6 8.7* 8.6*   GFR: Estimated Creatinine Clearance: 48.8 mL/min (by C-G formula based on SCr of 0.88 mg/dL). Liver Function Tests: Recent Labs  Lab 09/30/23 1004  AST 31  ALT 9  ALKPHOS 34*  BILITOT 1.3*  PROT 8.0  ALBUMIN 4.0   No results for input(s): "LIPASE", "AMYLASE" in the last 168 hours. No results for input(s): "AMMONIA" in the last 168 hours. Coagulation Profile: Recent Labs  Lab 09/30/23 1004  INR 1.2   Cardiac Enzymes: No results for input(s): "CKTOTAL", "CKMB", "  CKMBINDEX", "TROPONINI" in the last 168 hours. BNP (last 3 results) No results for input(s): "PROBNP" in the last 8760 hours. HbA1C: No results for input(s): "HGBA1C" in the last 72 hours. CBG: No results for input(s): "GLUCAP" in the last 168 hours. Lipid Profile: No results for input(s): "CHOL", "HDL", "LDLCALC", "TRIG", "CHOLHDL", "LDLDIRECT" in the last 72 hours. Thyroid Function Tests: No results for input(s): "TSH", "T4TOTAL", "FREET4", "T3FREE", "THYROIDAB" in the last 72 hours. Anemia Panel: No results for input(s): "VITAMINB12", "FOLATE", "FERRITIN", "TIBC", "IRON", "RETICCTPCT" in the last 72 hours. Sepsis Labs: Recent Labs  Lab 09/30/23 1027 09/30/23 1657  LATICACIDVEN  2.5* 0.7    Recent Results (from the past 240 hours)  Resp panel by RT-PCR (RSV, Flu A&B, Covid) Anterior Nasal Swab     Status: None   Collection Time: 09/30/23  9:29 AM   Specimen: Anterior Nasal Swab  Result Value Ref Range Status   SARS Coronavirus 2 by RT PCR NEGATIVE NEGATIVE Final    Comment: (NOTE) SARS-CoV-2 target nucleic acids are NOT DETECTED.  The SARS-CoV-2 RNA is generally detectable in upper respiratory specimens during the acute phase of infection. The lowest concentration of SARS-CoV-2 viral copies this assay can detect is 138 copies/mL. A negative result does not preclude SARS-Cov-2 infection and should not be used as the sole basis for treatment or other patient management decisions. A negative result may occur with  improper specimen collection/handling, submission of specimen other than nasopharyngeal swab, presence of viral mutation(s) within the areas targeted by this assay, and inadequate number of viral copies(<138 copies/mL). A negative result must be combined with clinical observations, patient history, and epidemiological information. The expected result is Negative.  Fact Sheet for Patients:  BloggerCourse.com  Fact Sheet for Healthcare Providers:  SeriousBroker.it  This test is no t yet approved or cleared by the [place] FDA and  has been authorized for detection and/or diagnosis of SARS-CoV-2 by FDA under an Emergency Use Authorization (EUA). This EUA will remain  in effect (meaning this test can be used) for the duration of the COVID-19 declaration under Section 564(b)(1) of the Act, 21 U.S.C.section 360bbb-3(b)(1), unless the authorization is terminated  or revoked sooner.       Influenza A by PCR NEGATIVE NEGATIVE Final   Influenza B by PCR NEGATIVE NEGATIVE Final    Comment: (NOTE) The Xpert Xpress SARS-CoV-2/FLU/RSV plus assay is intended as an aid in the diagnosis of influenza  from Nasopharyngeal swab specimens and should not be used as a sole basis for treatment. Nasal washings and aspirates are unacceptable for Xpert Xpress SARS-CoV-2/FLU/RSV testing.  Fact Sheet for Patients: BloggerCourse.com  Fact Sheet for Healthcare Providers: SeriousBroker.it  This test is not yet approved or cleared by the [place] FDA and has been authorized for detection and/or diagnosis of SARS-CoV-2 by FDA under an Emergency Use Authorization (EUA). This EUA will remain in effect (meaning this test can be used) for the duration of the COVID-19 declaration under Section 564(b)(1) of the Act, 21 U.S.C. section 360bbb-3(b)(1), unless the authorization is terminated or revoked.     Resp Syncytial Virus by PCR NEGATIVE NEGATIVE Final    Comment: (NOTE) Fact Sheet for Patients: BloggerCourse.com  Fact Sheet for Healthcare Providers: SeriousBroker.it  This test is not yet approved or cleared by the [place] FDA and has been authorized for detection and/or diagnosis of SARS-CoV-2 by FDA under an Emergency Use Authorization (EUA). This EUA will remain in effect (meaning this test can be used)  for the duration of the COVID-19 declaration under Section 564(b)(1) of the Act, 21 U.S.C. section 360bbb-3(b)(1), unless the authorization is terminated or revoked.  Performed at [facility], [address], [place], [place] [address]   Blood Culture (routine x 2)     Status: None (Preliminary result)   Collection Time: 09/30/23  9:33 AM   Specimen: BLOOD  Result Value Ref Range Status   Specimen Description   Final    BLOOD SITE NOT SPECIFIED Performed at [facility], [address] [place], [place] [address]    Special Requests   Final    BOTTLES DRAWN AEROBIC AND ANAEROBIC Blood Culture results may not be optimal due to an  inadequate volume of blood received in culture bottles Performed at [facility], [address] [place], [place] [address]    Culture   Final    NO GROWTH 3 DAYS Performed at [facility], [address], [place], [place] [address]    Report Status PENDING  Incomplete  Blood Culture (routine x 2)     Status: None (Preliminary result)   Collection Time: 09/30/23 10:04 AM   Specimen: BLOOD LEFT ARM  Result Value Ref Range Status   Specimen Description   Final    BLOOD LEFT ARM Performed at [facility], [address], [place], [place] [address]    Special Requests   Final    BOTTLES DRAWN AEROBIC AND ANAEROBIC Blood Culture results may not be optimal due to an inadequate volume of blood received in culture bottles Performed at [facility], [address], [place], [place] [address]    Culture   Final    NO GROWTH 3 DAYS Performed at [facility], [address], [place], [place] [address]    Report Status PENDING  Incomplete         Radiology Studies: No results found.       Scheduled Meds:  atenolol  12.5 mg Oral Daily   ezetimibe  10 mg Oral QHS   fenofibrate  160 mg Oral Daily   fluticasone furoate-vilanterol  1 puff Inhalation Daily   And   umeclidinium bromide  1 puff Inhalation Daily   heparin  5,000 Units Subcutaneous Q8H   pantoprazole  40 mg Oral Daily   pregabalin  50 mg Oral BID   Continuous Infusions:  cefTRIAXone (ROCEPHIN)  IV 2 g (10/02/23 1442)     LOS: 3 days    Time spent: 47 minutes spent on 10/03/2023 caring for this patient face-to-face including chart review, ordering labs/tests, documenting, discussion with nursing staff, consultants, updating family and interview/physical exam    Alvira Philips [place], DO [facility] Available via Epic secure chat 7am-7pm After these hours, please refer to coverage provider listed on [email or web address] 10/03/2023, 12:26 PM

## 2023-10-04 ENCOUNTER — Inpatient Hospital Stay (HOSPITAL_COMMUNITY)

## 2023-10-04 DIAGNOSIS — J189 Pneumonia, unspecified organism: Secondary | ICD-10-CM | POA: Diagnosis not present

## 2023-10-04 DIAGNOSIS — A419 Sepsis, unspecified organism: Secondary | ICD-10-CM | POA: Diagnosis not present

## 2023-10-04 LAB — GLUCOSE, CAPILLARY: Glucose-Capillary: 114 mg/dL — ABNORMAL HIGH (ref 70–99)

## 2023-10-04 NOTE — Progress Notes (Addendum)
       Overnight   NAME: Brittany Irwin MRN: [id number] DOB : 10/28/1940    Date of Service   10/04/2023   HPI/Events of Note    Notified by RN for fall.  83 year old female past medical history significant for chronic respiratory failure/COPD on 4 L nasal cannula chronically, anxiety, hypertension, chronic diastolic congestive heart failure, CKD stage IIIa, recent bowel obstruction, COVID-19 viral infection. Admitted through [facility] ER with progressive weakness, shortness of breath, confusion.  Family member reports progressive shortness of breath, confusion, progressive weakness.  Bedside visit: Auditory alarms alerted staff.  Patient explains that she had "gone to the bathroom using her walker and fell while attempting to re-enter the bed." Patient was found sitting beside the bed, between the bed and walker.  Patient is in no obvious or stated distress. She explains no injuries. Her stated complaint is discomfort to her left hip area (nonspecific) Patient explains adamantly that she "did not hit her head"   No deformities, contusions, abrasions, punctures, bruising, tears, lacerations, swelling. Movement of extremities is baseline, strength is equal and usual per patient.    Interventions/ Plan   Portable x-ray of left hip due to express of discomfort (nonspecific area of hip) Fall precautions Continue all previous attending orders.       Update: 0631 hrs Imaging   "FINDINGS: Bones are diffusely demineralized. SI joints and symphysis pubis unremarkable. No evidence for an acute fracture involving the bony anatomy of the pelvis or either femoral neck on this one-view pelvis study. Calcified injection granulomata noted bilaterally.   IMPRESSION: 1. No acute bony findings. 2. Diffuse bony demineralization.     Electronically Signed   By: Kennith Center M.D.   On: 10/04/2023 05:37"       Chinita Greenland BSN MSNA MSN ACNPC-AG Acute Care Nurse Practitioner [facility]

## 2023-10-04 NOTE — Progress Notes (Signed)
   10/04/23 0100  What Happened  Was fall witnessed? No  Was patient injured? No  Patient found on floor  Found by Staff-comment  Stated prior activity other (comment) (Ambulating with front wheel walker to bed from bathroom)  Provider Notification  Provider Name/Title Garner Nash, NP  Date Provider Notified 10/04/23  Time Provider Notified [phone]  Method of Notification  (messaged)  Notification Reason Fall  Test performed and critical result No new orders  Provider response No new orders  Date of Provider Response 10/04/23  Time of Provider Response 0052  Follow Up  Family notified Yes - comment Marisue Humble [phone])  Time family notified 0112  Additional tests No  Adult Fall Risk Assessment  Risk Factor Category (scoring not indicated) High fall risk per protocol (document High fall risk)  Age 83  Fall History: Fall within 6 months prior to admission 0  Elimination; Bowel and/or Urine Incontinence 0  Elimination; Bowel and/or Urine Urgency/Frequency 0  Medications: includes PCA/Opiates, Anti-convulsants, Anti-hypertensives, Diuretics, Hypnotics, Laxatives, Sedatives, and Psychotropics 5  Patient Care Equipment 0  Mobility-Assistance 2  Mobility-Gait 2  Mobility-Sensory Deficit 2  Altered awareness of immediate physical environment 1  Impulsiveness 2  Lack of understanding of one's physical/cognitive limitations 4  Total Score 21  Patient Fall Risk Level High fall risk  Adult Fall Risk Interventions  Required Bundle Interventions *See Row Information* High fall risk - low, moderate, and high requirements implemented  Additional Interventions Use of appropriate toileting equipment (bedpan, BSC, etc.);Room near nurses station  Screening for Fall Injury Risk (To be completed on HIGH fall risk patients) - Assessing Need for Floor Mats  Risk For Fall Injury- Criteria for Floor Mats Previous fall this admission  Will Implement Floor Mats Yes  Vitals  Temp 97.9 F (36.6  C)  Temp Source Oral  BP (!) 159/73  MAP (mmHg) 99  BP Location Left Arm  BP Method Automatic  Patient Position (if appropriate) Lying  Pulse Rate 78  Pulse Rate Source Dinamap  Oxygen Therapy  SpO2 94 %  O2 Device Nasal Cannula  O2 Flow Rate (L/min) 3 L/min  Pain Assessment  Pain Scale 0-10  Pain Score 8  Pain Type Chronic pain  Pain Location Back  Hot/Cold Interventions  K-Pad Status Cycling - On with barrier between patient and device  K-Pad Q2h Skin Assessment No adverse effect  K-Pad Orientation Posterior  K-Pad Location Back  PCA/Epidural/Spinal Assessment  Respiratory Pattern Regular;Unlabored  Neurological  Neuro (WDL) X  Level of Consciousness Alert  Orientation Level Oriented X4  Cognition Follows commands;Impulsive;Poor safety awareness  Speech Clear  Musculoskeletal  Musculoskeletal (WDL) X  Assistive Device Front wheel walker  Generalized Weakness Yes  Weight Bearing Restrictions Per Provider Order No  Integumentary  Integumentary (WDL) X  Skin Color Appropriate for ethnicity  Skin Condition Dry  Skin Integrity Intact  Ecchymosis Location Arm  Ecchymosis Location Orientation Bilateral  Erythema/Redness Location Buttocks  Erythema/Redness Location Orientation Bilateral  Skin Turgor Non-tenting   Pt yelling out "Help" and found by Tamika RN sitting on the floor coming from the bathroom.Pt reported she did not hit head and no visible injury and no new C/O.

## 2023-10-04 NOTE — Plan of Care (Signed)

## 2023-10-04 NOTE — Progress Notes (Signed)
 PROGRESS NOTE    Brittany Irwin  XBM:[id number] DOB: 08-Mar-1941 DOA: 09/30/2023 PCP: Teodoro Spray, MD    Brief Narrative:   Brittany Irwin is a 83 y.o. 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  Patient's sister reported that she has been progressively short of breath, confused and coughing up thick yellow sputum.  Also endorses progressive weakness.  Patient denied any fevers or chills.  No chest pain.  Recently seen by her outpatient pulmonologist, Dr. Patricia Pesa who prescribed 7 days of Ceftin and a 4-day prednisone taper; patient reports did not take prednisone due to previous side effects.  In the ED, temperature 102.0 F, HR 100, RR 23, BP 164/66, SpO2 92% on 4 L nasal cannula.  WBC 10.2, hemoglobin 12.7, platelet count 194.  Sodium 140, potassium 4.9, chloride 98, CO2 29, glucose 103, BUN 33, Cram 1.39.  AST 31, ALT 9, total bilirubin 1.3.  Lactic acid 2.5.  COVID/influenza/RSV PCR negative.  Urinalysis unrevealing.  Chest x-ray with new approximately 1.2 x 1.5 cm nodular opacity right lateral costophrenic angle.  CT chest without contrast with peripheral airspace disease right middle lobe and both lower lobes, nodular in appearance likely infectious/inflammatory, but cannot exclude underlying malignancy, CAD, mildly enlarged mediastinal lymph nodes likely reactive.  Patient was started on empiric antibiotics.  TRH consulted for admission for further evaluation management of community-acquired pneumonia.  Assessment & Plan:   Sepsis secondary to community-acquired pneumonia, POA Patient presenting to the ED with confusion, shortness of  breath, productive cough of yellow sputum.  Patient was febrile with temperature 102.0, tachycardic, tachypneic.  WBC count 10.2, lactic acid 2.5.  Chest imaging concerning for pneumonia. -- WBC 10.2>5.6>3.8 -- Lactic acid 2.5>0.7 -- Ceftriaxone 2 g IV every 24 hours x 5 days -- Supportive care, antipyretics -- Continue supplemental oxygen, maintain SpO2 greater than 88% (Baseline 3-4L [place]) -- Repeat CT chest without contrast in 3-4 weeks to exclude underlying malignancy  Acute renal failure on CKD stage IIIa -- Cr 1.34>1.02>0.88 -- Holding home Bumex and losartan -- Repeat BMP in a.m.  Chronic hypoxic respiratory failure  COPD Patient on home oxygen, baseline O2 needs 3-4 L per nasal cannula. On Trelegy Ellipta outpatient -- Breo Ellipta 1 puff daily -- Incruse Ellipta 1 puff daily -- Xopenex nebs every 4 hours as needed wheezing/shortness of breath  Anxiety -- Xanax 1 mg p.o. 3 times daily as needed anxiety  Chronic low back pain, history of spinal stenosis -- Tylenol No. 4 1 tablet every 6 hours as needed moderate pain -- Lyrica 50 mg p.o. twice daily -- Flexeril 5 mg p.o. twice daily as needed muscle spasm -- Dilaudid 0.5 mg IV every 3 hours as needed severe pain -- Kpad -- Recommend outpatient follow-up with PCP for referral to pain management for consideration of steroid injection  Hyperlipidemia -- Zetia 10 mg p.o. daily -- Fenofibrate 160 mg p.o. daily  Essential hypertension -- Atenolol 12.5 m p.o. daily -- Holding home bumex and losartan given AKI as above -- Continue monitor BP closely  History of Crohn's disease Follows with gastroenterology outpatient, Dr. Aline August.  Currently not on any immunosuppressive medications.  History of QTc prolongation -- Monitor on telemetry  DVT prophylaxis: heparin injection 5,000 Units Start: 09/30/23 1400    Code Status: Full Code Family Communication: No family present at bedside this morning  Disposition Plan:  Level  of care: Telemetry Status is: Inpatient Remains inpatient appropriate because: IV antibiotics, anticipate discharge home tomorrow    Consultants:  None  Procedures:  None  Antimicrobials:  Ceftriaxone 3/13>>   Subjective: Patient seen examined bedside, lying in bed.  Watching TV.  No specific complaints this morning.  Overnight, patient with unwitnessed fall after she was returning back from the bathroom by herself.  Reports that she was utilizing her walker but tripped when she hit the bedside table.  Denies loss of consciousness, no striking her head.  Seen by overnight coverage, pelvis x-ray negative. No other specific complaints or concerns at this time.  Denies headache, no dizziness, no chest pain, no palpitations, no current fever, no chills, no night sweats, no nausea/vomiting/diarrhea, no focal weakness, no fatigue, no paresthesias.  No other acute concerns overnight per nursing staff.  Objective: Vitals:   10/04/23 0437 10/04/23 0844 10/04/23 0847 10/04/23 1240  BP: (!) 132/54  (!) 146/72 (!) 116/58  Pulse: 62  62 62  Resp: 18  18 16   Temp: 98.1 F (36.7 C)  97.9 F (36.6 C) (!) 97.4 F (36.3 C)  TempSrc: Oral  Oral Oral  SpO2: 95% 93% 96% 94%  Weight:      Height:        Intake/Output Summary (Last 24 hours) at 10/04/2023 1245 Last data filed at 10/03/2023 2100 Gross per 24 hour  Intake --  Output 400 ml  Net -400 ml   Filed Weights   09/30/23 0909 09/30/23 1647  Weight: 74 kg 71.9 kg    Examination:  Physical Exam: GEN: NAD, alert and oriented x 3, elderly in appearance HEENT: NCAT, PERRL, EOMI, sclera clear, MMM PULM: Breath slightly diminished bilateral bases, no wheezes/crackles, normal respiratory effort without accessory muscle use, on 3 L nasal cannula which is at her baseline CV: RRR w/o M/G/R GI: abd soft, NTND, + BS MSK: no peripheral edema, moves all extremities dependently NEURO: CN II-XII intact, no focal deficits, sensation to light touch  intact PSYCH: normal mood/affect Integumentary: No concerning rashes/lesions/wounds noted on exposed skin surfaces    Data Reviewed: I have personally reviewed following labs and imaging studies  CBC: Recent Labs  Lab 09/30/23 1004 10/01/23 0404 10/02/23 0417  WBC 10.2 5.6 3.8*  NEUTROABS 8.5*  --   --   HGB 12.7 10.4* 10.3*  HCT 42.0 33.5* 33.9*  MCV 98.1 97.1 97.7  PLT 194 142* 145*   Basic Metabolic Panel: Recent Labs  Lab 09/30/23 1004 10/01/23 0404 10/02/23 0417  NA 140 139 139  K 4.9 3.8 3.9  CL 98 105 108  CO2 29 24 24   GLUCOSE 103* 106* 104*  BUN 33* 26* 23  CREATININE 1.39* 1.02* 0.88  CALCIUM 9.6 8.7* 8.6*   GFR: Estimated Creatinine Clearance: 48.8 mL/min (by C-G formula based on SCr of 0.88 mg/dL). Liver Function Tests: Recent Labs  Lab 09/30/23 1004  AST 31  ALT 9  ALKPHOS 34*  BILITOT 1.3*  PROT 8.0  ALBUMIN 4.0   No results for input(s): "LIPASE", "AMYLASE" in the last 168 hours. No results for input(s): "AMMONIA" in the last 168 hours. Coagulation Profile: Recent Labs  Lab 09/30/23 1004  INR 1.2   Cardiac Enzymes: No results for input(s): "CKTOTAL", "CKMB", "CKMBINDEX", "TROPONINI" in the last 168 hours. BNP (  last 3 results) No results for input(s): "PROBNP" in the last 8760 hours. HbA1C: No results for input(s): "HGBA1C" in the last 72 hours. CBG: Recent Labs  Lab 10/04/23 0036  GLUCAP 114*   Lipid Profile: No results for input(s): "CHOL", "HDL", "LDLCALC", "TRIG", "CHOLHDL", "LDLDIRECT" in the last 72 hours. Thyroid Function Tests: No results for input(s): "TSH", "T4TOTAL", "FREET4", "T3FREE", "THYROIDAB" in the last 72 hours. Anemia Panel: No results for input(s): "VITAMINB12", "FOLATE", "FERRITIN", "TIBC", "IRON", "RETICCTPCT" in the last 72 hours. Sepsis Labs: Recent Labs  Lab 09/30/23 1027 09/30/23 1657  LATICACIDVEN 2.5* 0.7    Recent Results (from the past 240 hours)  Resp panel by RT-PCR (RSV, Flu A&B, Covid)  Anterior Nasal Swab     Status: None   Collection Time: 09/30/23  9:29 AM   Specimen: Anterior Nasal Swab  Result Value Ref Range Status   SARS Coronavirus 2 by RT PCR NEGATIVE NEGATIVE Final    Comment: (NOTE) SARS-CoV-2 target nucleic acids are NOT DETECTED.  The SARS-CoV-2 RNA is generally detectable in upper respiratory specimens during the acute phase of infection. The lowest concentration of SARS-CoV-2 viral copies this assay can detect is 138 copies/mL. A negative result does not preclude SARS-Cov-2 infection and should not be used as the sole basis for treatment or other patient management decisions. A negative result may occur with  improper specimen collection/handling, submission of specimen other than nasopharyngeal swab, presence of viral mutation(s) within the areas targeted by this assay, and inadequate number of viral copies(<138 copies/mL). A negative result must be combined with clinical observations, patient history, and epidemiological information. The expected result is Negative.  Fact Sheet for Patients:  BloggerCourse.com  Fact Sheet for Healthcare Providers:  SeriousBroker.it  This test is no t yet approved or cleared by the [place] FDA and  has been authorized for detection and/or diagnosis of SARS-CoV-2 by FDA under an Emergency Use Authorization (EUA). This EUA will remain  in effect (meaning this test can be used) for the duration of the COVID-19 declaration under Section 564(b)(1) of the Act, 21 U.S.C.section 360bbb-3(b)(1), unless the authorization is terminated  or revoked sooner.       Influenza A by PCR NEGATIVE NEGATIVE Final   Influenza B by PCR NEGATIVE NEGATIVE Final    Comment: (NOTE) The Xpert Xpress SARS-CoV-2/FLU/RSV plus assay is intended as an aid in the diagnosis of influenza from Nasopharyngeal swab specimens and should not be used as a sole basis for treatment. Nasal washings  and aspirates are unacceptable for Xpert Xpress SARS-CoV-2/FLU/RSV testing.  Fact Sheet for Patients: BloggerCourse.com  Fact Sheet for Healthcare Providers: SeriousBroker.it  This test is not yet approved or cleared by the [place] FDA and has been authorized for detection and/or diagnosis of SARS-CoV-2 by FDA under an Emergency Use Authorization (EUA). This EUA will remain in effect (meaning this test can be used) for the duration of the COVID-19 declaration under Section 564(b)(1) of the Act, 21 U.S.C. section 360bbb-3(b)(1), unless the authorization is terminated or revoked.     Resp Syncytial Virus by PCR NEGATIVE NEGATIVE Final    Comment: (NOTE) Fact Sheet for Patients: BloggerCourse.com  Fact Sheet for Healthcare Providers: SeriousBroker.it  This test is not yet approved or cleared by the [place] FDA and has been authorized for detection and/or diagnosis of SARS-CoV-2 by FDA under an Emergency Use Authorization (EUA). This EUA will remain in effect (meaning this test can be used) for the duration of the COVID-19 declaration  under Section 564(b)(1) of the Act, 21 U.S.C. section 360bbb-3(b)(1), unless the authorization is terminated or revoked.  Performed at [facility], [address], [place], [place] [address]   Blood Culture (routine x 2)     Status: None (Preliminary result)   Collection Time: 09/30/23  9:33 AM   Specimen: BLOOD  Result Value Ref Range Status   Specimen Description   Final    BLOOD SITE NOT SPECIFIED Performed at [facility], [address], [place], [place] [address]    Special Requests   Final    BOTTLES DRAWN AEROBIC AND ANAEROBIC Blood Culture results may not be optimal due to an inadequate volume of blood received in culture bottles Performed at [facility], [address], [place], [place] [address]    Culture   Final    NO GROWTH 4 DAYS Performed at [facility], [address] [place], [place] [address]    Report Status PENDING  Incomplete  Blood Culture (routine x 2)     Status: None (Preliminary result)   Collection Time: 09/30/23 10:04 AM   Specimen: BLOOD LEFT ARM  Result Value Ref Range Status   Specimen Description   Final    BLOOD LEFT ARM Performed at [facility], [address] [place], [place] [address]    Special Requests   Final    BOTTLES DRAWN AEROBIC AND ANAEROBIC Blood Culture results may not be optimal due to an inadequate volume of blood received in culture bottles Performed at [facility], [address] [place], [place] [address]    Culture   Final    NO GROWTH 4 DAYS Performed at [facility], [address], [place], [place] [address]    Report Status PENDING  Incomplete         Radiology Studies: DG Pelvis Portable Result Date: 10/04/2023 CLINICAL DATA:  Status post fall. EXAM: PORTABLE PELVIS 1-2 VIEWS COMPARISON:  None Available. FINDINGS: Bones are diffusely demineralized. SI joints and symphysis pubis unremarkable. No evidence for an acute fracture involving the bony anatomy of the pelvis or either femoral neck on this one-view pelvis study. Calcified injection granulomata noted bilaterally. IMPRESSION: 1. No acute bony findings. 2. Diffuse bony demineralization. Electronically Signed   By: Kennith Center M.D.   On: 10/04/2023 05:37         Scheduled Meds:  atenolol  12.5 mg Oral Daily   ezetimibe  10 mg Oral QHS   fenofibrate  160 mg Oral Daily   fluticasone furoate-vilanterol  1 puff Inhalation Daily   And   umeclidinium bromide  1 puff Inhalation Daily   heparin  5,000 Units Subcutaneous Q8H   pantoprazole  40 mg Oral Daily   pregabalin  50 mg Oral BID   Continuous Infusions:  cefTRIAXone (ROCEPHIN)  IV 2 g (10/03/23 1550)     LOS: 4 days    Time  spent: 47 minutes spent on 10/04/2023 caring for this patient face-to-face including chart review, ordering labs/tests, documenting, discussion with nursing staff, consultants, updating family and interview/physical exam    Alvira Philips [place], DO [facility] Available via Epic secure chat 7am-7pm After these hours, please refer to coverage provider listed on [email or web address] 10/04/2023, 12:45 PM

## 2023-10-04 NOTE — Evaluation (Addendum)
 Physical Therapy Brief Re-Evaluation and Discharge Note Patient Details Name: Brittany Irwin MRN: [id number] DOB: 08-14-1940 Today's Date: 10/04/2023   History of Present Illness  Brittany Irwin is a 83 y.o. female admitted to the hospital on 09/30/23 with sepsis due to healthcare acquired pneumonia. Pt sustained a fall early morning 3/17 in hospital.  Past medical history significant for COPD chronically on 4 L nasal cannula oxygen, CAD, aspiration PNA and recent hospitalization for SBO and more recently on 09/05/23 for acute COVID-19 virus infection  Clinical Impression  PT re-eval at MD request-pt sustained a fall in her room early am 10/04/23. Pt is currently Mod Ind with mobility. No acute PT needs. Could consider OP PT f/u for back pain management if pt chooses to do so. 1x eval.        PT Assessment    Assistance Needed at Discharge       Equipment Recommendations None recommended by PT  Recommendations for Other Services       Precautions/Restrictions Precautions Precautions: Fall Precaution/Restrictions Comments: Severe back pain - pt reports hx of spinal stenosis Restrictions Weight Bearing Restrictions Per Provider Order: No        Mobility  Bed Mobility          Transfers Overall transfer level: Modified independent                      Ambulation/Gait Ambulation/Gait assistance: Modified independent (Device/Increase time) Gait Distance (Feet): 300 Feet Assistive device: Rollator (4 wheels) Gait Pattern/deviations: Step-through pattern   General Gait Details: Fair gait speed. O2 90% on 4L (pt uses 4L for activity at baseline). dyspnea 2/4.  Home Activity Instructions    Stairs            Modified Rankin (Stroke Patients Only)        Balance                          Pertinent Vitals/Pain   Pain Assessment Pain Assessment: Faces Faces Pain Scale: Hurts even more Pain Location: mild left back Pain  Intervention(s): Monitored during session, Repositioned     Home Living   Living Arrangements: Alone       Home Equipment: Rolling Walker (2 wheels);Rollator (4 wheels);Grab bars - tub/shower;Hand held shower head   Additional Comments: Pt reports plans for a shower chair.    Prior Function        UE/LE Assessment               Communication   Communication Communication: No apparent difficulties     Cognition         General Comments      Exercises     Assessment/Plan    PT Problem List Decreased strength;Decreased range of motion;Decreased activity tolerance;Decreased balance;Decreased mobility;Decreased knowledge of use of DME       PT Visit Diagnosis Difficulty in walking, not elsewhere classified (R26.2)    No Skilled PT Patient is modified independent with all activity/mobility   Co-evaluation                AMPAC 6 Clicks Help needed turning from your back to your side while in a flat bed without using bedrails?: None Help needed moving from lying on your back to sitting on the side of a flat bed without using bedrails?: None Help needed moving to and from a bed to a chair (including a wheelchair)?: None Help  needed standing up from a chair using your arms (e.g., wheelchair or bedside chair)?: None Help needed to walk in hospital room?: None Help needed climbing 3-5 steps with a railing? : A Little 6 Click Score: 23      End of Session Equipment Utilized During Treatment: Gait belt;Oxygen Activity Tolerance: Patient tolerated treatment well Patient left: in bed;with call bell/phone within reach   PT Visit Diagnosis: Difficulty in walking, not elsewhere classified (R26.2)     Time: 3710-6269 PT Time Calculation (min) (ACUTE ONLY): 20 min  Charges:   PT Evaluation $PT Re-evaluation: 1 Re-eval         Faye Ramsay, PT Acute Rehabilitation  Office: [phone]

## 2023-10-05 DIAGNOSIS — A419 Sepsis, unspecified organism: Secondary | ICD-10-CM | POA: Diagnosis not present

## 2023-10-05 DIAGNOSIS — J189 Pneumonia, unspecified organism: Secondary | ICD-10-CM | POA: Diagnosis not present

## 2023-10-05 LAB — CULTURE, BLOOD (ROUTINE X 2)
Culture: NO GROWTH
Culture: NO GROWTH

## 2023-10-05 NOTE — Progress Notes (Signed)
 Discharge instructions reviewed with patient. All questions answered. All belongings accounted for. Patient to follow up with MD in 1 weeks. Medications reviewed. PIV removed. Assisted via WC to private vehicle.

## 2023-10-05 NOTE — Discharge Instructions (Addendum)
 Follow up with PCP in 1-2 weeks For follow-up with pulmonology, Dr. Steele Berg in 1 week Recommend outpatient referral to pain management/interventional radiology for localized steroid injection for her chronic back pain Recommend repeat CT chest without contrast in 3-4 weeks

## 2023-10-05 NOTE — Care Management Important Message (Signed)
 Important Message  Patient Details IM Letter given to the Patient. Name: Brittany Irwin MRN: [id number] Date of Birth: 01/21/1941   Important Message Given:  Yes - Medicare IM     Caren Macadam 10/05/2023, 11:23 AM

## 2023-10-05 NOTE — Discharge Summary (Signed)
 Physician Discharge Summary  [name]:[id number] DOB: 1941/04/27 DOA: 09/30/2023  PCP: Teodoro Spray, MD  Admit date: 09/30/2023 Discharge date: 10/05/2023  Admitted From: Home Disposition: Home  Recommendations for Outpatient Follow-up:  Follow up with PCP in 1-2 weeks For follow-up with pulmonology, Dr. Steele Berg in 1 week Recommend outpatient referral to pain management/interventional radiology for localized steroid injection for her chronic back pain Recommend repeat CT chest without contrast in 3-4 weeks to exclude underlying malignancy  Home Health: Outpatient physical therapy Equipment/Devices: Continues on her home oxygen of 3-4 L per nasal cannula  Discharge Condition: Stable CODE STATUS: Full code Diet recommendation: Heart healthy diet  History of present illness:  Brittany Irwin is a 83 y.o. 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  Patient's sister reported that she has been progressively short of breath, confused and coughing up thick yellow sputum.  Also endorses progressive weakness.  Patient denied any fevers or chills.  No chest pain.  Recently seen by her outpatient pulmonologist, Dr. Patricia Pesa who prescribed 7 days of Ceftin and a 4-day prednisone taper; patient reports did not take prednisone due to previous side effects.   In the ED, temperature 102.0 F, HR 100, RR 23, BP 164/66, SpO2 92% on 4 L nasal cannula.  WBC 10.2, hemoglobin 12.7, platelet count 194.  Sodium 140, potassium 4.9, chloride 98, CO2 29, glucose 103, BUN 33, Cram 1.39.  AST 31, ALT 9, total bilirubin 1.3.  Lactic acid 2.5.   COVID/influenza/RSV PCR negative.  Urinalysis unrevealing.  Chest x-ray with new approximately 1.2 x 1.5 cm nodular opacity right lateral costophrenic angle.  CT chest without contrast with peripheral airspace disease right middle lobe and both lower lobes, nodular in appearance likely infectious/inflammatory, but cannot exclude underlying malignancy, CAD, mildly enlarged mediastinal lymph nodes likely reactive.  Patient was started on empiric antibiotics.  TRH consulted for admission for further evaluation management of community-acquired pneumonia.  Hospital course:  Sepsis secondary to community-acquired pneumonia, POA Patient presenting to the ED with confusion, shortness of breath, productive cough of yellow sputum.  Patient was febrile with temperature 102.0, tachycardic, tachypneic.  WBC count 10.2, lactic acid 2.5.  Chest imaging concerning for pneumonia.  Patient was started on ceftriaxone and completed 5-day course during hospitalization.  Patient's oxygen was weaned back to her baseline 3 L nasal cannula at time of discharge.  Outpatient follow-up with her pulmonologist in 1-2 weeks. Repeat CT chest without contrast in 3-4 weeks to exclude underlying malignancy   Acute renal failure on CKD stage IIIa Cr 1.34 on admission, supported with IV fluid hydration with improvement of creatinine to 0.88 at time of discharge.  Will discontinue home losartan given borderline blood pressures.  May resume home Bumex to use as needed for lower extreme edema.  Outpatient follow-up with PCP 1-2 weeks.  Recommend repeat BMP at visit.   Chronic hypoxic respiratory failure  COPD Patient on home oxygen, baseline O2 needs 3-4 L per nasal cannula. On Trelegy Ellipta outpatient   Anxiety Xanax 1 mg p.o. 3 times daily as needed anxiety   Chronic low back pain, history of spinal stenosis Tylenol No. 4 1 tablet every 6 hours as needed moderate pain,  Lyrica 50 mg p.o. twice daily, Flexeril 5 mg p.o. twice daily as  needed muscle spasm. Recommend outpatient follow-up with PCP for referral to pain management or interventional radiology for consideration of steroid injection   Hyperlipidemia Zetia 10 mg p.o. daily, Fenofibrate 160 mg p.o. daily   Essential hypertension Continue atenolol 12.5 m p.o. daily and Bumex as needed for lower extremity IMA.  Losartan discontinued due to acute renal failure as well as borderline blood pressures that were monitored during hospitalization.  Blood pressure at time of discharge 116/60.  Outpatient follow-up with PCP.   History of Crohn's disease Follows with gastroenterology outpatient, Dr. Aline August.  Currently not on any immunosuppressive medications.   History of QTc prolongation Monitor on telemetry  Discharge Diagnoses:  Principal Problem:   Sepsis due to pneumonia [facility])    Discharge Instructions  Discharge Instructions     Ambulatory referral to Physical Therapy   Complete by: As directed    Call MD for:  difficulty breathing, headache or visual disturbances   Complete by: As directed    Call MD for:  extreme fatigue   Complete by: As directed    Call MD for:  persistant dizziness or light-headedness   Complete by: As directed    Call MD for:  persistant nausea and vomiting   Complete by: As directed    Call MD for:  severe uncontrolled pain   Complete by: As directed    Call MD for:  temperature >100.4   Complete by: As directed    Diet - low sodium heart healthy   Complete by: As directed    Increase activity slowly   Complete by: As directed       Allergies as of 10/05/2023       Reactions   Methylprednisolone Other (See Comments)   Increased BP, HR, agitation, hallucinations    Shellfish Allergy Nausea And Vomiting   All SEAFOOD   Ativan [lorazepam]    Psychosis   Azulfidine [sulfasalazine] Other (See Comments)   Headache    Epipen [epinephrine] Hypertension   Flagyl [metronidazole] Hives   Motrin [ibuprofen] Nausea Only, Other  (See Comments)   Told not to take medication due to GI distress caused by crohn's disease.   Nsaids Other (See Comments)   Told not to take NSAIDs due to GI distress caused by crohn's disease   Penicillins Hives   Morphine And Codeine Itching   Told not to take due to GI distress caused by crohn's disease        Medication List     STOP taking these medications    losartan 25 MG tablet Commonly known as: COZAAR       TAKE these medications    acetaminophen-codeine 300-60 MG tablet Commonly known as: TYLENOL #4 Take 1 tablet by mouth every 6 (six) hours as needed for pain.   ALPRAZolam 1 MG tablet Commonly known as: XANAX Take 1 mg by mouth 3 (three) times daily as needed for anxiety.   atenolol 25 MG tablet Commonly known as: Tenormin Take half tablet (12.5 mg) by mouth daily. DO NOT take if heart rate is less than 60 BPM What changed:  how much to take how to take this when to take this additional instructions   AYR SALINE NASAL GEL NA Place 1 Application into the nose 2 (two) times daily as needed (Dry nose).   bumetanide 1 MG tablet Commonly known as: BUMEX Take 1 tablet (1 mg total) by mouth daily  as needed. What changed: when to take this   cyanocobalamin 1000 MCG/ML injection Commonly known as: VITAMIN B12 Inject 1,000 mcg into the muscle every 30 (thirty) days.   cyclobenzaprine 5 MG tablet Commonly known as: FLEXERIL Take 5 mg by mouth 2 (two) times daily as needed for muscle spasms.   ergocalciferol 1.25 MG (50000 UT) capsule Commonly known as: VITAMIN D2 Take 50,000 Units by mouth every 14 (fourteen) days. Every other Friday   ezetimibe 10 MG tablet Commonly known as: ZETIA Take 10 mg by mouth at bedtime.   fenofibrate micronized 200 MG capsule Commonly known as: LOFIBRA Take 200 mg by mouth daily.   guaiFENesin-dextromethorphan 100-10 MG/5ML syrup Commonly known as: ROBITUSSIN DM Take 10 mLs by mouth every 4 (four) hours as needed for  cough.   ICY HOT BACK EX Apply 1 Application topically 2 (two) times daily as needed (back pain).   levalbuterol 0.63 MG/3ML nebulizer solution Commonly known as: XOPENEX Take 3 mLs (0.63 mg total) by nebulization every 4 (four) hours as needed for wheezing or shortness of breath.   loratadine 10 MG tablet Commonly known as: CLARITIN Take 1 tablet (10 mg total) by mouth daily for 14 days.   melatonin 3 MG Tabs tablet Take 1 tablet (3 mg total) by mouth at bedtime. What changed:  when to take this reasons to take this   nitroGLYCERIN 0.4 MG SL tablet Commonly known as: NITROSTAT Place 0.4 mg under the tongue every 5 (five) minutes x 3 doses as needed for chest pain.   omeprazole 40 MG capsule Commonly known as: PRILOSEC Take 40 mg by mouth daily.   potassium chloride SA 20 MEQ tablet Commonly known as: KLOR-CON M Take 20 mEq by mouth daily.   predniSONE 20 MG tablet Commonly known as: DELTASONE Take 20 mg by mouth in the morning and at bedtime.   pregabalin 50 MG capsule Commonly known as: LYRICA Take 50 mg by mouth 2 (two) times daily.   Trelegy Ellipta 200-62.5-25 MCG/ACT Aepb Generic drug: Fluticasone-Umeclidin-Vilant Inhale 1 puff into the lungs daily.   Voltaren Arthritis Pain 1 % Gel Generic drug: diclofenac Sodium Apply 1 Application topically 2 (two) times daily as needed (back pain).        Follow-up Information     Teodoro Spray, MD. Schedule an appointment as soon as possible for a visit in 1 week(s).   Specialty: Internal Medicine Contact information: [address] [place] [facility] [address] [phone]         Moss Mc, MD. Schedule an appointment as soon as possible for a visit in 1 week(s).   Specialty: Internal Medicine Contact information: [address] [place] [address] [phone]                Allergies  Allergen Reactions   Methylprednisolone Other (See Comments)    Increased BP, HR,  agitation, hallucinations    Shellfish Allergy Nausea And Vomiting    All SEAFOOD   Ativan [Lorazepam]     Psychosis   Azulfidine [Sulfasalazine] Other (See Comments)    Headache    Epipen [Epinephrine] Hypertension   Flagyl [Metronidazole] Hives   Motrin [Ibuprofen] Nausea Only and Other (See Comments)    Told not to take medication due to GI distress caused by crohn's disease.   Nsaids Other (See Comments)    Told not to take NSAIDs due to GI distress caused by crohn's disease   Penicillins Hives   Morphine And Codeine Itching  Told not to take due to GI distress caused by crohn's disease    Consultations: None   Procedures/Studies: DG Pelvis Portable Result Date: 10/04/2023 CLINICAL DATA:  Status post fall. EXAM: PORTABLE PELVIS 1-2 VIEWS COMPARISON:  None Available. FINDINGS: Bones are diffusely demineralized. SI joints and symphysis pubis unremarkable. No evidence for an acute fracture involving the bony anatomy of the pelvis or either femoral neck on this one-view pelvis study. Calcified injection granulomata noted bilaterally. IMPRESSION: 1. No acute bony findings. 2. Diffuse bony demineralization. Electronically Signed   By: Kennith Center M.D.   On: 10/04/2023 05:37   CT Chest Wo Contrast Result Date: 09/30/2023 CLINICAL DATA:  Pneumonia, complication suspected, xray done. Shortness of breath, cough. Abnormal chest x-ray. EXAM: CT CHEST WITHOUT CONTRAST TECHNIQUE: Multidetector CT imaging of the chest was performed following the standard protocol without IV contrast. RADIATION DOSE REDUCTION: This exam was performed according to the departmental dose-optimization program which includes automated exposure control, adjustment of the mA and/or kV according to patient size and/or use of iterative reconstruction technique. COMPARISON:  Chest x-ray today. FINDINGS: Cardiovascular: Left chest wall pacer in place with leads in the right atrium and right ventricle. Heart mildly enlarged.  Coronary artery and aortic atherosclerosis. No evidence of aortic aneurysm. Mediastinum/Nodes: Prominent mediastinal lymph nodes. Right paratracheal lymph node has a short axis diameter of 1.3 cm. No axillary or visible hilar adenopathy. Lungs/Pleura: Moderate centrilobular emphysema. No pleural effusions. Peripheral nodular airspace opacities in the right lower lobe corresponding to the density seen on prior chest x-ray. There are nodular airspace opacities also posteriorly in the left lower lobe and within the right middle lobe. I favor this is infectious/inflammatory, possibly related to pneumonia. No effusions. Right middle lobe atelectasis noted. Upper Abdomen: No acute findings Musculoskeletal: Chest wall soft tissues are unremarkable. No acute bony abnormality. IMPRESSION: Peripheral airspace disease in the right middle lobe and both lower lobes with somewhat nodular appearance in some areas, particularly right lower lobe. I favor this is likely infectious/inflammatory. Cannot exclude pneumonia. Followup chest CT is recommended in 3-4 weeks following trial of antibiotic therapy to ensure resolution and exclude underlying malignancy. Coronary artery disease. Mildly enlarged mediastinal lymph nodes, likely reactive. Recommend attention on follow-up imaging. Aortic Atherosclerosis (ICD10-I70.0) and Emphysema (ICD10-J43.9). Electronically Signed   By: Charlett Nose M.D.   On: 09/30/2023 16:08   DG Chest 2 View Result Date: 09/30/2023 CLINICAL DATA:  high clinical suspicion for PNA. Weakness. Shortness of breath. Cough. EXAM: CHEST - 2 VIEW COMPARISON:  09/05/2023. FINDINGS: Low lung volume. There is a new approximately 1.2 x 1.5 cm nodular opacity overlying the right lateral costophrenic angle region. The opacity is not clearly seen on the lateral film. Further evaluation with chest CT scan is recommended. There are probable atelectatic changes in the retrocardiac region, right more than left. Bilateral lung  fields are otherwise clear. Bilateral costophrenic angles are clear. Normal cardio-mediastinal silhouette. There is a left sided 2-lead pacemaker. No acute osseous abnormalities. The soft tissues are within normal limits. IMPRESSION: *There is a new approximately 1.2 x 1.5 cm nodular opacity overlying the right lateral costophrenic angle region. The opacity is not clearly seen on the lateral film. Further evaluation with chest CT scan is recommended. Electronically Signed   By: Jules Schick M.D.   On: 09/30/2023 11:47   DG Chest Port 1 View Result Date: 09/05/2023 CLINICAL DATA:  fever EXAM: PORTABLE CHEST 1 VIEW COMPARISON:  August 11, 2023 FINDINGS: Evaluation is limited by  rotation. The cardiomediastinal silhouette is unchanged and enlarged in contour with similar prominence of the RIGHT pericardial border.LEFT chest cardiac pacing device. No pleural effusion. No pneumothorax. No acute pleuroparenchymal abnormality. IMPRESSION: No acute cardiopulmonary abnormality. Electronically Signed   By: Meda Klinefelter M.D.   On: 09/05/2023 19:44   CT Head Wo Contrast Result Date: 09/05/2023 CLINICAL DATA:  Mental status change, unknown cause EXAM: CT HEAD WITHOUT CONTRAST TECHNIQUE: Contiguous axial images were obtained from the base of the skull through the vertex without intravenous contrast. RADIATION DOSE REDUCTION: This exam was performed according to the departmental dose-optimization program which includes automated exposure control, adjustment of the mA and/or kV according to patient size and/or use of iterative reconstruction technique. COMPARISON:  None Available. FINDINGS: Brain: No evidence of acute infarction, hemorrhage, hydrocephalus, extra-axial collection or mass lesion/mass effect. Vascular: No hyperdense vessel. Skull: No acute fracture. Sinuses/Orbits: Clear sinuses.  No acute orbital findings. Other: No mastoid effusions. IMPRESSION: No evidence of acute intracranial abnormality.  Electronically Signed   By: Feliberto Harts M.D.   On: 09/05/2023 19:40     Subjective: Patient seen examined bedside, lying in bed.  Ordering breakfast.  No complaints this morning.  Ready for discharge home.  Appreciative all the care she has received in the hospital.  Discussed with patient would benefit from outpatient follow-up with pain management for consideration of localized steroid injection to help with her chronic low back pain.  No other questions or concerns at this time.  Denies headache, no dizziness, no chest pain, no palpitations, no shortness of breath greater than her typical baseline, no abdominal pain, no fever/chills/night sweats, no nausea/vomiting/diarrhea, no focal weakness, no fatigue, no paresthesias.  No acute events overnight per nursing staff.  Discharge Exam: Vitals:   10/04/23 2156 10/05/23 0613  BP: (!) 103/56 116/60  Pulse: 66 61  Resp: 18 18  Temp: (!) 97.5 F (36.4 C) 97.7 F (36.5 C)  SpO2: 94% 95%   Vitals:   10/04/23 0847 10/04/23 1240 10/04/23 2156 10/05/23 0613  BP: (!) 146/72 (!) 116/58 (!) 103/56 116/60  Pulse: 62 62 66 61  Resp: 18 16 18 18   Temp: 97.9 F (36.6 C) (!) 97.4 F (36.3 C) (!) 97.5 F (36.4 C) 97.7 F (36.5 C)  TempSrc: Oral Oral Oral Oral  SpO2: 96% 94% 94% 95%  Weight:      Height:        Physical Exam: GEN: NAD, alert and oriented x 3, elderly in appearance HEENT: NCAT, PERRL, EOMI, sclera clear, MMM PULM: CTAB w/o wheezes/crackles, normal respiratory effort, on 3 L nasal cannula which is at her baseline CV: RRR w/o M/G/R GI: abd soft, NTND, + BS MSK: no peripheral edema, muscle strength globally intact 5/5 bilateral upper/lower extremities NEURO: CN II-XII intact, no focal deficits, sensation to light touch intact PSYCH: normal mood/affect Integumentary: dry/intact, no rashes or wounds    The results of significant diagnostics from this hospitalization (including imaging, microbiology, ancillary and  laboratory) are listed below for reference.     Microbiology: Recent Results (from the past 240 hours)  Resp panel by RT-PCR (RSV, Flu A&B, Covid) Anterior Nasal Swab     Status: None   Collection Time: 09/30/23  9:29 AM   Specimen: Anterior Nasal Swab  Result Value Ref Range Status   SARS Coronavirus 2 by RT PCR NEGATIVE NEGATIVE Final    Comment: (NOTE) SARS-CoV-2 target nucleic acids are NOT DETECTED.  The SARS-CoV-2 RNA is generally detectable in  upper respiratory specimens during the acute phase of infection. The lowest concentration of SARS-CoV-2 viral copies this assay can detect is 138 copies/mL. A negative result does not preclude SARS-Cov-2 infection and should not be used as the sole basis for treatment or other patient management decisions. A negative result may occur with  improper specimen collection/handling, submission of specimen other than nasopharyngeal swab, presence of viral mutation(s) within the areas targeted by this assay, and inadequate number of viral copies(<138 copies/mL). A negative result must be combined with clinical observations, patient history, and epidemiological information. The expected result is Negative.  Fact Sheet for Patients:  BloggerCourse.com  Fact Sheet for Healthcare Providers:  SeriousBroker.it  This test is no t yet approved or cleared by the [place] FDA and  has been authorized for detection and/or diagnosis of SARS-CoV-2 by FDA under an Emergency Use Authorization (EUA). This EUA will remain  in effect (meaning this test can be used) for the duration of the COVID-19 declaration under Section 564(b)(1) of the Act, 21 U.S.C.section 360bbb-3(b)(1), unless the authorization is terminated  or revoked sooner.       Influenza A by PCR NEGATIVE NEGATIVE Final   Influenza B by PCR NEGATIVE NEGATIVE Final    Comment: (NOTE) The Xpert Xpress SARS-CoV-2/FLU/RSV plus assay is  intended as an aid in the diagnosis of influenza from Nasopharyngeal swab specimens and should not be used as a sole basis for treatment. Nasal washings and aspirates are unacceptable for Xpert Xpress SARS-CoV-2/FLU/RSV testing.  Fact Sheet for Patients: BloggerCourse.com  Fact Sheet for Healthcare Providers: SeriousBroker.it  This test is not yet approved or cleared by the [place] FDA and has been authorized for detection and/or diagnosis of SARS-CoV-2 by FDA under an Emergency Use Authorization (EUA). This EUA will remain in effect (meaning this test can be used) for the duration of the COVID-19 declaration under Section 564(b)(1) of the Act, 21 U.S.C. section 360bbb-3(b)(1), unless the authorization is terminated or revoked.     Resp Syncytial Virus by PCR NEGATIVE NEGATIVE Final    Comment: (NOTE) Fact Sheet for Patients: BloggerCourse.com  Fact Sheet for Healthcare Providers: SeriousBroker.it  This test is not yet approved or cleared by the [place] FDA and has been authorized for detection and/or diagnosis of SARS-CoV-2 by FDA under an Emergency Use Authorization (EUA). This EUA will remain in effect (meaning this test can be used) for the duration of the COVID-19 declaration under Section 564(b)(1) of the Act, 21 U.S.C. section 360bbb-3(b)(1), unless the authorization is terminated or revoked.  Performed at [facility], [address], [place], [place] [address]   Blood Culture (routine x 2)     Status: None   Collection Time: 09/30/23  9:33 AM   Specimen: BLOOD  Result Value Ref Range Status   Specimen Description   Final    BLOOD SITE NOT SPECIFIED Performed at [facility], [address] [place], [place] [address]    Special Requests   Final    BOTTLES DRAWN AEROBIC AND ANAEROBIC Blood Culture results may not  be optimal due to an inadequate volume of blood received in culture bottles Performed at [facility], [address] [place], [place] [address]    Culture   Final    NO GROWTH 5 DAYS Performed at [facility], [address], [place], [place] [address]    Report Status 10/05/2023 FINAL  Final  Blood Culture (routine x 2)  Status: None   Collection Time: 09/30/23 10:04 AM   Specimen: BLOOD LEFT ARM  Result Value Ref Range Status   Specimen Description   Final    BLOOD LEFT ARM Performed at [facility], [address], [place], [place] [address]    Special Requests   Final    BOTTLES DRAWN AEROBIC AND ANAEROBIC Blood Culture results may not be optimal due to an inadequate volume of blood received in culture bottles Performed at [facility], [address], [place], [place] [address]    Culture   Final    NO GROWTH 5 DAYS Performed at [facility], [address] [place], [place] [address]    Report Status 10/05/2023 FINAL  Final     Labs: BNP (last 3 results) Recent Labs    10/14/22 0900  BNP 321.6*   Basic Metabolic Panel: Recent Labs  Lab 09/30/23 1004 10/01/23 0404 10/02/23 0417  NA 140 139 139  K 4.9 3.8 3.9  CL 98 105 108  CO2 29 24 24   GLUCOSE 103* 106* 104*  BUN 33* 26* 23  CREATININE 1.39* 1.02* 0.88  CALCIUM 9.6 8.7* 8.6*   Liver Function Tests: Recent Labs  Lab 09/30/23 1004  AST 31  ALT 9  ALKPHOS 34*  BILITOT 1.3*  PROT 8.0  ALBUMIN 4.0   No results for input(s): "LIPASE", "AMYLASE" in the last 168 hours. No results for input(s): "AMMONIA" in the last 168 hours. CBC: Recent Labs  Lab 09/30/23 1004 10/01/23 0404 10/02/23 0417  WBC 10.2 5.6 3.8*  NEUTROABS 8.5*  --   --   HGB 12.7 10.4* 10.3*  HCT 42.0 33.5* 33.9*  MCV 98.1 97.1 97.7  PLT 194 142* 145*   Cardiac Enzymes: No results for input(s): "CKTOTAL", "CKMB", "CKMBINDEX", "TROPONINI" in the last 168  hours. BNP: Invalid input(s): "POCBNP" CBG: Recent Labs  Lab 10/04/23 0036  GLUCAP 114*   D-Dimer No results for input(s): "DDIMER" in the last 72 hours. Hgb A1c No results for input(s): "HGBA1C" in the last 72 hours. Lipid Profile No results for input(s): "CHOL", "HDL", "LDLCALC", "TRIG", "CHOLHDL", "LDLDIRECT" in the last 72 hours. Thyroid function studies No results for input(s): "TSH", "T4TOTAL", "T3FREE", "THYROIDAB" in the last 72 hours.  Invalid input(s): "FREET3" Anemia work up No results for input(s): "VITAMINB12", "FOLATE", "FERRITIN", "TIBC", "IRON", "RETICCTPCT" in the last 72 hours. Urinalysis    Component Value Date/Time   COLORURINE YELLOW 09/30/2023 0929   APPEARANCEUR CLEAR 09/30/2023 0929   LABSPEC 1.014 09/30/2023 0929   PHURINE 7.0 09/30/2023 0929   GLUCOSEU NEGATIVE 09/30/2023 0929   HGBUR NEGATIVE 09/30/2023 0929   BILIRUBINUR NEGATIVE 09/30/2023 0929   KETONESUR NEGATIVE 09/30/2023 0929   PROTEINUR NEGATIVE 09/30/2023 0929   NITRITE NEGATIVE 09/30/2023 0929   LEUKOCYTESUR NEGATIVE 09/30/2023 0929   Sepsis Labs Recent Labs  Lab 09/30/23 1004 10/01/23 0404 10/02/23 0417  WBC 10.2 5.6 3.8*   Microbiology Recent Results (from the past 240 hours)  Resp panel by RT-PCR (RSV, Flu A&B, Covid) Anterior Nasal Swab     Status: None   Collection Time: 09/30/23  9:29 AM   Specimen: Anterior Nasal Swab  Result Value Ref Range Status   SARS Coronavirus 2 by RT PCR NEGATIVE NEGATIVE Final    Comment: (NOTE) SARS-CoV-2 target nucleic acids are NOT DETECTED.  The SARS-CoV-2 RNA is generally detectable in upper respiratory specimens during the acute phase of infection. The lowest concentration of SARS-CoV-2 viral copies  this assay can detect is 138 copies/mL. A negative result does not preclude SARS-Cov-2 infection and should not be used as the sole basis for treatment or other patient management decisions. A negative result may occur with  improper  specimen collection/handling, submission of specimen other than nasopharyngeal swab, presence of viral mutation(s) within the areas targeted by this assay, and inadequate number of viral copies(<138 copies/mL). A negative result must be combined with clinical observations, patient history, and epidemiological information. The expected result is Negative.  Fact Sheet for Patients:  BloggerCourse.com  Fact Sheet for Healthcare Providers:  SeriousBroker.it  This test is no t yet approved or cleared by the [place] FDA and  has been authorized for detection and/or diagnosis of SARS-CoV-2 by FDA under an Emergency Use Authorization (EUA). This EUA will remain  in effect (meaning this test can be used) for the duration of the COVID-19 declaration under Section 564(b)(1) of the Act, 21 U.S.C.section 360bbb-3(b)(1), unless the authorization is terminated  or revoked sooner.       Influenza A by PCR NEGATIVE NEGATIVE Final   Influenza B by PCR NEGATIVE NEGATIVE Final    Comment: (NOTE) The Xpert Xpress SARS-CoV-2/FLU/RSV plus assay is intended as an aid in the diagnosis of influenza from Nasopharyngeal swab specimens and should not be used as a sole basis for treatment. Nasal washings and aspirates are unacceptable for Xpert Xpress SARS-CoV-2/FLU/RSV testing.  Fact Sheet for Patients: BloggerCourse.com  Fact Sheet for Healthcare Providers: SeriousBroker.it  This test is not yet approved or cleared by the [place] FDA and has been authorized for detection and/or diagnosis of SARS-CoV-2 by FDA under an Emergency Use Authorization (EUA). This EUA will remain in effect (meaning this test can be used) for the duration of the COVID-19 declaration under Section 564(b)(1) of the Act, 21 U.S.C. section 360bbb-3(b)(1), unless the authorization is terminated or revoked.     Resp  Syncytial Virus by PCR NEGATIVE NEGATIVE Final    Comment: (NOTE) Fact Sheet for Patients: BloggerCourse.com  Fact Sheet for Healthcare Providers: SeriousBroker.it  This test is not yet approved or cleared by the [place] FDA and has been authorized for detection and/or diagnosis of SARS-CoV-2 by FDA under an Emergency Use Authorization (EUA). This EUA will remain in effect (meaning this test can be used) for the duration of the COVID-19 declaration under Section 564(b)(1) of the Act, 21 U.S.C. section 360bbb-3(b)(1), unless the authorization is terminated or revoked.  Performed at [facility], [address] [place], [place] [address]   Blood Culture (routine x 2)     Status: None   Collection Time: 09/30/23  9:33 AM   Specimen: BLOOD  Result Value Ref Range Status   Specimen Description   Final    BLOOD SITE NOT SPECIFIED Performed at [facility], [address], [place], [place] [address]    Special Requests   Final    BOTTLES DRAWN AEROBIC AND ANAEROBIC Blood Culture results may not be optimal due to an inadequate volume of blood received in culture bottles Performed at [facility], [address], [place], [place] [address]    Culture   Final    NO GROWTH 5 DAYS Performed at [facility], [address], [place], [place] [address]    Report Status 10/05/2023 FINAL  Final  Blood Culture (routine x 2)     Status: None   Collection Time: 09/30/23 10:04 AM   Specimen: BLOOD LEFT  ARM  Result Value Ref Range Status   Specimen Description   Final    BLOOD LEFT ARM Performed at [facility], [address], [place], [place] [address]    Special Requests   Final    BOTTLES DRAWN AEROBIC AND ANAEROBIC Blood Culture results may not be optimal due to an inadequate volume of blood received in culture bottles Performed at [facility],  [address] [place], [place] [address]    Culture   Final    NO GROWTH 5 DAYS Performed at [facility], [address] [place], [place] [address]    Report Status 10/05/2023 FINAL  Final     Time coordinating discharge: Over 30 minutes  SIGNED:   Alvira Philips [place], DO  [facility] 10/05/2023, 9:53 AM

## 2023-10-05 NOTE — Plan of Care (Signed)

## 2023-12-21 ENCOUNTER — Other Ambulatory Visit (HOSPITAL_COMMUNITY): Payer: Self-pay

## 2023-12-23 ENCOUNTER — Other Ambulatory Visit (HOSPITAL_COMMUNITY): Payer: Self-pay

## 2023-12-23 ENCOUNTER — Other Ambulatory Visit: Payer: Self-pay

## 2023-12-23 MED ORDER — TRELEGY ELLIPTA 200-62.5-25 MCG/ACT IN AEPB
1.0000 | INHALATION_SPRAY | Freq: Every day | RESPIRATORY_TRACT | 11 refills | Status: AC
  Filled 2023-12-23: qty 60, 30d supply, fill #0
  Filled 2024-02-28: qty 60, 30d supply, fill #1
  Filled 2024-05-26: qty 60, 30d supply, fill #2

## 2023-12-24 ENCOUNTER — Other Ambulatory Visit (HOSPITAL_COMMUNITY): Payer: Self-pay

## 2024-02-14 ENCOUNTER — Observation Stay (HOSPITAL_COMMUNITY)
Admission: EM | Admit: 2024-02-14 | Discharge: 2024-02-15 | Disposition: A | Discharge status: Home / Self Care | Attending: Internal Medicine | Admitting: Internal Medicine

## 2024-02-14 ENCOUNTER — Emergency Department (HOSPITAL_COMMUNITY)

## 2024-02-14 ENCOUNTER — Other Ambulatory Visit: Payer: Self-pay

## 2024-02-14 ENCOUNTER — Encounter (HOSPITAL_COMMUNITY): Payer: Self-pay

## 2024-02-14 ENCOUNTER — Observation Stay (HOSPITAL_COMMUNITY)

## 2024-02-14 DIAGNOSIS — E785 Hyperlipidemia, unspecified: Secondary | ICD-10-CM | POA: Diagnosis present

## 2024-02-14 DIAGNOSIS — N1831 Chronic kidney disease, stage 3a: Secondary | ICD-10-CM | POA: Insufficient documentation

## 2024-02-14 DIAGNOSIS — J449 Chronic obstructive pulmonary disease, unspecified: Secondary | ICD-10-CM | POA: Diagnosis not present

## 2024-02-14 DIAGNOSIS — M545 Low back pain, unspecified: Secondary | ICD-10-CM | POA: Insufficient documentation

## 2024-02-14 DIAGNOSIS — R8290 Unspecified abnormal findings in urine: Secondary | ICD-10-CM | POA: Diagnosis present

## 2024-02-14 DIAGNOSIS — Z87891 Personal history of nicotine dependence: Secondary | ICD-10-CM | POA: Diagnosis not present

## 2024-02-14 DIAGNOSIS — R6510 Systemic inflammatory response syndrome (SIRS) of non-infectious origin without acute organ dysfunction: Secondary | ICD-10-CM | POA: Diagnosis present

## 2024-02-14 DIAGNOSIS — K219 Gastro-esophageal reflux disease without esophagitis: Secondary | ICD-10-CM | POA: Diagnosis not present

## 2024-02-14 DIAGNOSIS — R739 Hyperglycemia, unspecified: Secondary | ICD-10-CM

## 2024-02-14 DIAGNOSIS — N179 Acute kidney failure, unspecified: Secondary | ICD-10-CM | POA: Diagnosis not present

## 2024-02-14 DIAGNOSIS — G8929 Other chronic pain: Secondary | ICD-10-CM | POA: Diagnosis present

## 2024-02-14 DIAGNOSIS — I2510 Atherosclerotic heart disease of native coronary artery without angina pectoris: Secondary | ICD-10-CM | POA: Insufficient documentation

## 2024-02-14 DIAGNOSIS — J9611 Chronic respiratory failure with hypoxia: Secondary | ICD-10-CM | POA: Diagnosis not present

## 2024-02-14 DIAGNOSIS — M549 Dorsalgia, unspecified: Secondary | ICD-10-CM

## 2024-02-14 DIAGNOSIS — I5032 Chronic diastolic (congestive) heart failure: Secondary | ICD-10-CM | POA: Diagnosis not present

## 2024-02-14 DIAGNOSIS — I495 Sick sinus syndrome: Secondary | ICD-10-CM | POA: Diagnosis not present

## 2024-02-14 DIAGNOSIS — R7309 Other abnormal glucose: Secondary | ICD-10-CM | POA: Diagnosis not present

## 2024-02-14 DIAGNOSIS — R4182 Altered mental status, unspecified: Secondary | ICD-10-CM | POA: Diagnosis present

## 2024-02-14 DIAGNOSIS — I130 Hypertensive heart and chronic kidney disease with heart failure and stage 1 through stage 4 chronic kidney disease, or unspecified chronic kidney disease: Secondary | ICD-10-CM | POA: Diagnosis not present

## 2024-02-14 DIAGNOSIS — I5030 Unspecified diastolic (congestive) heart failure: Secondary | ICD-10-CM | POA: Diagnosis present

## 2024-02-14 DIAGNOSIS — F419 Anxiety disorder, unspecified: Secondary | ICD-10-CM | POA: Diagnosis present

## 2024-02-14 DIAGNOSIS — G629 Polyneuropathy, unspecified: Secondary | ICD-10-CM | POA: Insufficient documentation

## 2024-02-14 DIAGNOSIS — R410 Disorientation, unspecified: Secondary | ICD-10-CM | POA: Insufficient documentation

## 2024-02-14 DIAGNOSIS — Z950 Presence of cardiac pacemaker: Secondary | ICD-10-CM | POA: Diagnosis present

## 2024-02-14 DIAGNOSIS — G9341 Metabolic encephalopathy: Secondary | ICD-10-CM | POA: Diagnosis not present

## 2024-02-14 DIAGNOSIS — K5090 Crohn's disease, unspecified, without complications: Secondary | ICD-10-CM | POA: Diagnosis not present

## 2024-02-14 DIAGNOSIS — R0602 Shortness of breath: Secondary | ICD-10-CM | POA: Diagnosis present

## 2024-02-14 DIAGNOSIS — N189 Chronic kidney disease, unspecified: Secondary | ICD-10-CM | POA: Diagnosis present

## 2024-02-14 HISTORY — DX: Chronic obstructive pulmonary disease, unspecified: J44.9

## 2024-02-14 LAB — COMPREHENSIVE METABOLIC PANEL WITH GFR
ALT: 15 U/L (ref 0–44)
AST: 37 U/L (ref 15–41)
Albumin: 3.7 g/dL (ref 3.5–5.0)
Alkaline Phosphatase: 37 U/L — ABNORMAL LOW (ref 38–126)
Anion gap: 12 (ref 5–15)
BUN: 23 mg/dL (ref 8–23)
CO2: 26 mmol/L (ref 22–32)
Calcium: 9 mg/dL (ref 8.9–10.3)
Chloride: 100 mmol/L (ref 98–111)
Creatinine, Ser: 1.31 mg/dL — ABNORMAL HIGH (ref 0.44–1.00)
GFR, Estimated: 41 mL/min — ABNORMAL LOW (ref >60)
Glucose, Bld: 124 mg/dL — ABNORMAL HIGH (ref 70–99)
Potassium: 4.3 mmol/L (ref 3.5–5.1)
Sodium: 138 mmol/L (ref 135–145)
Total Bilirubin: 0.9 mg/dL (ref 0.0–1.2)
Total Protein: 7 g/dL (ref 6.5–8.1)

## 2024-02-14 LAB — BRAIN NATRIURETIC PEPTIDE: B Natriuretic Peptide: 742.6 pg/mL — ABNORMAL HIGH (ref 0.0–100.0)

## 2024-02-14 LAB — RESP PANEL BY RT-PCR (RSV, FLU A&B, COVID)  RVPGX2
Influenza A by PCR: NEGATIVE
Influenza B by PCR: NEGATIVE
Resp Syncytial Virus by PCR: NEGATIVE
SARS Coronavirus 2 by RT PCR: NEGATIVE

## 2024-02-14 LAB — C DIFFICILE QUICK SCREEN W PCR REFLEX
C Diff antigen: NEGATIVE
C Diff interpretation: NOT DETECTED
C Diff toxin: NEGATIVE

## 2024-02-14 LAB — CBC WITH DIFFERENTIAL/PLATELET
Abs Immature Granulocytes: 0.06 K/uL (ref 0.00–0.07)
Basophils Absolute: 0.1 K/uL (ref 0.0–0.1)
Basophils Relative: 1 %
Eosinophils Absolute: 0.1 K/uL (ref 0.0–0.5)
Eosinophils Relative: 1 %
HCT: 41 % (ref 36.0–46.0)
Hemoglobin: 12.8 g/dL (ref 12.0–15.0)
Immature Granulocytes: 1 %
Lymphocytes Relative: 5 %
Lymphs Abs: 0.7 K/uL (ref 0.7–4.0)
MCH: 29.1 pg (ref 26.0–34.0)
MCHC: 31.2 g/dL (ref 30.0–36.0)
MCV: 93.2 fL (ref 80.0–100.0)
Monocytes Absolute: 0.7 K/uL (ref 0.1–1.0)
Monocytes Relative: 5 %
Neutro Abs: 11.4 K/uL — ABNORMAL HIGH (ref 1.7–7.7)
Neutrophils Relative %: 87 %
Platelets: 181 K/uL (ref 150–400)
RBC: 4.4 MIL/uL (ref 3.87–5.11)
RDW: 14.1 % (ref 11.5–15.5)
WBC: 13 K/uL — ABNORMAL HIGH (ref 4.0–10.5)
nRBC: 0 % (ref 0.0–0.2)

## 2024-02-14 LAB — URINALYSIS, W/ REFLEX TO CULTURE (INFECTION SUSPECTED)
Bilirubin Urine: NEGATIVE
Glucose, UA: NEGATIVE mg/dL
Hgb urine dipstick: NEGATIVE
Ketones, ur: NEGATIVE mg/dL
Nitrite: NEGATIVE
Protein, ur: NEGATIVE mg/dL
Specific Gravity, Urine: >1.03 — ABNORMAL HIGH (ref 1.005–1.030)
pH: 6 (ref 5.0–8.0)

## 2024-02-14 LAB — RESPIRATORY PANEL BY PCR

## 2024-02-14 LAB — PROTIME-INR
INR: 1.2 (ref 0.8–1.2)
Prothrombin Time: 15.4 s — ABNORMAL HIGH (ref 11.4–15.2)

## 2024-02-14 LAB — I-STAT CG4 LACTIC ACID, ED
Lactic Acid, Venous: 1.5 mmol/L (ref 0.5–1.9)
Lactic Acid, Venous: 0.9 mmol/L (ref 0.5–1.9)

## 2024-02-14 LAB — SODIUM, URINE, RANDOM: Sodium, Ur: 84 mmol/L

## 2024-02-14 LAB — CREATININE, URINE, RANDOM: Creatinine, Urine: 76 mg/dL

## 2024-02-14 LAB — PROCALCITONIN: Procalcitonin: <0.1 ng/mL

## 2024-02-14 MED ORDER — VANCOMYCIN HCL IN DEXTROSE 1-5 GM/200ML-% IV SOLN: 1000.0000 mg | Freq: Once | INTRAVENOUS | Status: DC

## 2024-02-14 MED ORDER — LACTATED RINGERS IV BOLUS (SEPSIS): 250.0000 mL | Freq: Once | INTRAVENOUS | Status: DC

## 2024-02-14 MED ORDER — SODIUM CHLORIDE 0.9 % IV SOLN: 2.0000 g | Freq: Once | INTRAVENOUS | Status: DC

## 2024-02-14 MED ORDER — ATENOLOL 25 MG PO TABS
25.0000 mg | ORAL_TABLET | Freq: Every day | ORAL | Status: DC
  Administered 2024-02-15: 25 mg via ORAL
  Filled 2024-02-14: qty 1

## 2024-02-14 MED ORDER — EZETIMIBE 10 MG PO TABS
10.0000 mg | ORAL_TABLET | Freq: Every day | ORAL | Status: DC
  Administered 2024-02-15: 10 mg via ORAL
  Filled 2024-02-14: qty 1

## 2024-02-14 MED ORDER — LEVALBUTEROL HCL 0.63 MG/3ML IN NEBU: 0.6300 mg | INHALATION_SOLUTION | RESPIRATORY_TRACT | Status: DC | PRN

## 2024-02-14 MED ORDER — IPRATROPIUM-ALBUTEROL 0.5-2.5 (3) MG/3ML IN SOLN: 3.0000 mL | RESPIRATORY_TRACT | Status: DC | PRN

## 2024-02-14 MED ORDER — ALPRAZOLAM 0.25 MG PO TABS
1.0000 mg | ORAL_TABLET | Freq: Two times a day (BID) | ORAL | Status: DC | PRN
  Administered 2024-02-14 – 2024-02-15 (×2): 1 mg via ORAL
  Filled 2024-02-14 (×2): qty 4

## 2024-02-14 MED ORDER — PREGABALIN 25 MG PO CAPS
50.0000 mg | ORAL_CAPSULE | Freq: Two times a day (BID) | ORAL | Status: DC
Start: 2024-02-14 — End: 2024-02-15
  Administered 2024-02-14 – 2024-02-15 (×3): 50 mg via ORAL
  Filled 2024-02-14 (×3): qty 2

## 2024-02-14 MED ORDER — LACTATED RINGERS IV SOLN: INTRAVENOUS | Status: DC

## 2024-02-14 MED ORDER — ENOXAPARIN SODIUM 40 MG/0.4ML IJ SOSY
40.0000 mg | PREFILLED_SYRINGE | INTRAMUSCULAR | Status: DC
  Administered 2024-02-14 – 2024-02-15 (×2): 40 mg via SUBCUTANEOUS
  Filled 2024-02-14 (×2): qty 0.4

## 2024-02-14 MED ORDER — SODIUM CHLORIDE 0.9% FLUSH
3.0000 mL | Freq: Two times a day (BID) | INTRAVENOUS | Status: DC
  Administered 2024-02-14 – 2024-02-15 (×3): 3 mL via INTRAVENOUS

## 2024-02-14 MED ORDER — FENOFIBRATE 160 MG PO TABS
160.0000 mg | ORAL_TABLET | Freq: Every day | ORAL | Status: DC
  Administered 2024-02-15: 160 mg via ORAL
  Filled 2024-02-14: qty 1

## 2024-02-14 MED ORDER — BUDESON-GLYCOPYRROL-FORMOTEROL 160-9-4.8 MCG/ACT IN AERO
2.0000 | INHALATION_SPRAY | Freq: Two times a day (BID) | RESPIRATORY_TRACT | Status: DC
  Administered 2024-02-14 – 2024-02-15 (×3): 2 via RESPIRATORY_TRACT
  Filled 2024-02-14: qty 5.9

## 2024-02-14 MED ORDER — DICLOFENAC SODIUM 1 % EX GEL: 1.0000 | Freq: Two times a day (BID) | CUTANEOUS | Status: DC | PRN

## 2024-02-14 MED ORDER — ACETAMINOPHEN-CODEINE 300-30 MG PO TABS
1.0000 | ORAL_TABLET | Freq: Four times a day (QID) | ORAL | Status: DC | PRN
  Administered 2024-02-14 – 2024-02-15 (×4): 1 via ORAL
  Filled 2024-02-14 (×4): qty 1

## 2024-02-14 MED ORDER — SODIUM CHLORIDE 0.9 % IV SOLN
1.0000 g | INTRAVENOUS | Status: DC
  Administered 2024-02-14 – 2024-02-15 (×2): 1 g via INTRAVENOUS
  Filled 2024-02-14 (×2): qty 10

## 2024-02-14 MED ORDER — AYR SALINE NASAL NA GEL: 1.0000 | Freq: Two times a day (BID) | NASAL | Status: DC | PRN

## 2024-02-14 MED ORDER — LACTATED RINGERS IV BOLUS (SEPSIS): 1000.0000 mL | Freq: Once | INTRAVENOUS | Status: DC

## 2024-02-14 MED ORDER — ACETAMINOPHEN 650 MG RE SUPP
650.0000 mg | Freq: Four times a day (QID) | RECTAL | Status: DC | PRN
Start: 2024-02-14 — End: 2024-02-15

## 2024-02-14 MED ORDER — SODIUM CHLORIDE 0.9 % IV BOLUS
1000.0000 mL | Freq: Once | INTRAVENOUS | Status: AC
  Administered 2024-02-14: 1000 mL via INTRAVENOUS

## 2024-02-14 MED ORDER — ACETAMINOPHEN 325 MG PO TABS
650.0000 mg | ORAL_TABLET | Freq: Four times a day (QID) | ORAL | Status: DC | PRN
Start: 2024-02-14 — End: 2024-02-15

## 2024-02-14 MED ORDER — ONDANSETRON HCL 4 MG PO TABS: 4.0000 mg | ORAL_TABLET | Freq: Four times a day (QID) | ORAL | Status: DC | PRN

## 2024-02-14 MED ORDER — ONDANSETRON HCL 4 MG/2ML IJ SOLN: 4.0000 mg | Freq: Four times a day (QID) | INTRAMUSCULAR | Status: DC | PRN

## 2024-02-14 MED ORDER — ACETAMINOPHEN-CODEINE 300-60 MG PO TABS: 1.0000 | ORAL_TABLET | Freq: Four times a day (QID) | ORAL | Status: DC | PRN

## 2024-02-14 NOTE — ED Provider Notes (Signed)
 [place] EMERGENCY DEPARTMENT AT [facility] Provider Note   CSN: [id number] Arrival date & time: 02/14/24  9680     Patient presents with: Altered Mental Status   Brittany Irwin is a 83 y.o. female.   The history is provided by the patient and the EMS personnel.  Altered Mental Status  She has history of heart failure, pneumonia, coronary artery disease and was brought in by ambulance with concern for possible sepsis.  EMS was called to the home because patient was hypoxic on her usual oxygen.  She is on 5 oxygen at 4 L/min and saturation tonight went down to 88%.  They placed her on a nonrebreather mask and oxygen saturation improved to 96%.  Patient was not aware of fever but does endorse chills, denies sweats.  She denies any cough.  She denies nausea or vomiting and she denies any urinary symptoms.  Unfortunately, she is a rather poor historian.    Prior to Admission medications   Medication Sig Start Date End Date Taking? Authorizing Provider  acetaminophen -codeine  (TYLENOL  #4) 300-60 MG tablet Take 1 tablet by mouth every 6 (six) hours as needed for pain. 12/31/21   [provider]  Aloe-Sodium Chloride  (AYR SALINE NASAL GEL NA) Place 1 Application into the nose 2 (two) times daily as needed (Dry nose).    [provider]  ALPRAZolam  (XANAX ) 1 MG tablet Take 1 mg by mouth 3 (three) times daily as needed for anxiety. 01/02/22   [provider]  atenolol  (TENORMIN ) 25 MG tablet Take half tablet (12.5 mg) by mouth daily. DO NOT take if heart rate is less than 60 BPM Patient taking differently: Take 25 mg by mouth daily. DO NOT take if heart rate is less than 60 BPM 10/21/22   Johnson, Kathleen R, PA-C  bumetanide  (BUMEX ) 1 MG tablet Take 1 tablet (1 mg total) by mouth daily as needed. Patient taking differently: Take 1 mg by mouth daily. 08/15/23   Dahal, Binaya, MD  cyanocobalamin (,VITAMIN B-12,) 1000 MCG/ML injection Inject 1,000 mcg into the  muscle every 30 (thirty) days.    [provider]  cyclobenzaprine  (FLEXERIL ) 5 MG tablet Take 5 mg by mouth 2 (two) times daily as needed for muscle spasms. 10/13/22   [provider]  diclofenac  Sodium (VOLTAREN  ARTHRITIS PAIN) 1 % GEL Apply 1 Application topically 2 (two) times daily as needed (back pain).    [provider]  ergocalciferol (VITAMIN D2) 1.25 MG (50000 UT) capsule Take 50,000 Units by mouth every 14 (fourteen) days. Every other Friday    [provider]  ezetimibe  (ZETIA ) 10 MG tablet Take 10 mg by mouth at bedtime. 12/31/21   [provider]  fenofibrate  micronized (LOFIBRA) 200 MG capsule Take 200 mg by mouth daily. 12/31/21   [provider]  Fluticasone -Umeclidin-Vilant (TRELEGY ELLIPTA ) 200-62.5-25 MCG/ACT AEPB Inhale 1 puff into the lungs daily. 12/22/23     guaiFENesin -dextromethorphan  (ROBITUSSIN DM) 100-10 MG/5ML syrup Take 10 mLs by mouth every 4 (four) hours as needed for cough. 09/11/23   Christobal Guadalajara, MD  levalbuterol  (XOPENEX ) 0.63 MG/3ML nebulizer solution Take 3 mLs (0.63 mg total) by nebulization every 4 (four) hours as needed for wheezing or shortness of breath. 09/11/23 10/11/23  Christobal Guadalajara, MD  loratadine  (CLARITIN ) 10 MG tablet Take 1 tablet (10 mg total) by mouth daily for 14 days. 09/12/23 09/30/23  Christobal Guadalajara, MD  melatonin 3 MG TABS tablet Take 1 tablet (3 mg total) by  mouth at bedtime. Patient taking differently: Take 3 mg by mouth at bedtime as needed (sleep). 08/15/23   DahalChapman, MD  Menthol, Topical Analgesic, (ICY HOT BACK EX) Apply 1 Application topically 2 (two) times daily as needed (back pain).    [provider]  nitroGLYCERIN (NITROSTAT) 0.4 MG SL tablet Place 0.4 mg under the tongue every 5 (five) minutes x 3 doses as needed for chest pain. 12/31/21   [provider]  omeprazole (PRILOSEC) 40 MG capsule Take 40 mg by mouth daily.    [provider]  potassium chloride  SA  (KLOR-CON  M) 20 MEQ tablet Take 20 mEq by mouth daily. 12/31/21   [provider]  predniSONE (DELTASONE) 20 MG tablet Take 20 mg by mouth in the morning and at bedtime.    [provider]  pregabalin  (LYRICA ) 50 MG capsule Take 50 mg by mouth 2 (two) times daily.    [provider]    Allergies: Methylprednisolone, Shellfish allergy, Ativan  [lorazepam ], Azulfidine [sulfasalazine], Epipen [epinephrine], Flagyl [metronidazole], Motrin [ibuprofen], Nsaids, Penicillins, and Morphine and codeine     Review of Systems  All other systems reviewed and are negative.   Updated Vital Signs BP (!) 135/105   Pulse 69   Temp 100 F (37.8 C) (Oral)   Resp 18   SpO2 94%   Physical Exam Vitals and nursing note reviewed.   83 year old female, resting comfortably and in no acute distress. Vital signs are significant for elevated blood pressure.  Temperature is normal, but higher than would be expected for this time of day. Oxygen saturation is 94% on her baseline 4 L/min nasal oxygen, which is normal. Head is normocephalic and atraumatic. PERRLA, EOMI. Oropharynx is clear. Neck is nontender and supple. Lungs are clear without rales, wheezes, or rhonchi. Chest is nontender. Heart has regular rate and rhythm without murmur. Abdomen is soft, flat, nontender. Extremities have no cyanosis or edema, full range of motion is present. Skin is warm and dry without rash. Neurologic: Awake and alert, moves all extremities equally.  (all labs ordered are listed, but only abnormal results are displayed) Labs Reviewed  CULTURE, BLOOD (ROUTINE X 2)  CULTURE, BLOOD (ROUTINE X 2)  RESP PANEL BY RT-PCR (RSV, FLU A&B, COVID)  [id number]  COMPREHENSIVE METABOLIC PANEL WITH GFR  CBC WITH DIFFERENTIAL/PLATELET  PROTIME-INR  URINALYSIS, W/ REFLEX TO CULTURE (INFECTION SUSPECTED)  I-STAT CG4 LACTIC ACID, ED    EKG: EKG Interpretation Date/Time:  Monday February 14 2024 03:33:24 EDT Ventricular  Rate:  70 PR Interval:  205 QRS Duration:  134 QT Interval:  432 QTC Calculation: 467 R Axis:   254  Text Interpretation: Sinus rhythm Nonspecific IVCD with LAD Probable lateral infarct, old When compared with ECG of 09/05/2023, No significant change was found Confirmed by Raford Lenis ([id number]) on 02/14/2024 3:36:14 AM  Radiology: No results found.   Procedures   Medications Ordered in the ED - No data to display                                  Medical Decision Making Amount and/or Complexity of Data Reviewed Labs: ordered. Radiology: ordered.  Risk Decision regarding hospitalization.   Report of hypoxia but with normal oxygenation here.  Chills with possible low-grade fever concerning for viral illness versus occult pneumonia versus other infection such as UTI.  She does not appear septic.  She is not tachycardic or  hypotensive.  I have initiated the evolving sepsis pathway.  Also, because of nonspecific symptoms I have ordered viral respiratory panel.  I have reviewed her past records, and note hospital admission on 09/30/2023 for sepsis secondary to pneumonia.  I have reviewed her electrocardiogram, my interpretation is nonspecific intraventricular conduction delay unchanged from prior.  No acute ST or T changes.  Chest x-ray shows no evidence of pneumonia.  Have independently viewed the image, and agree with the radiologist's interpretation.  I have reviewed her laboratory test, my interpretation is mild leukocytosis with left shift which is concerning for possible infection but nonspecific, acute kidney injury with creatinine 1.31 compared with 0.88 previously, elevated random glucose level which will need to be followed as an outpatient, normal lactic acid level x 2, negative PCR for COVID-19 and influenza and RSV.  Urinalysis is still pending.  I am concerned about occult sepsis in spite of normal lactic acid levels.  Given mental status change and acute kidney injury, I feel  hospital admission is appropriate.  I have ordered IV fluids send I have discussed case with Dr. Marcene of [facility], who agrees to admit the patient.     Final diagnoses:  Acute kidney injury (nontraumatic) (HCC)  Confusion  Elevated random blood glucose level    ED Discharge Orders     None          Raford Lenis, MD 02/14/24 [phone]

## 2024-02-14 NOTE — ED Triage Notes (Signed)
 Patient is coming from home. EMS was originally called out because patient's oxygen saturation was 88% on her chronic 4 liters. Patient was placed on a nonrebreather and her oxygen saturation rose to 96%. Patient was placed back on her chronic 4L and maintain an O2 sat of 96%. Patient is A/Ox2, patient is normally A/Ox4. Patient has been in and out of the hospital recently and it is unknown about antibiotic therapy. Patient has a demand pacemaker.

## 2024-02-14 NOTE — TOC Initial Note (Signed)
 Transition of Care [facility]) - Initial/Assessment Note    Patient Details  Name: Brittany Irwin MRN: [id number] Date of Birth: [birth year]  [facility]) CM/SW Contact:    Jeoffrey LITTIE Moose, LCSW Phone Number: 02/14/2024, 9:33 AM  Clinical Narrative:                 Pt admitted from home. No current TOC needs, please consult as needs arise.         Patient Goals and CMS Choice            Expected Discharge Plan and Services                                              Prior Living Arrangements/Services                       Activities of Daily Living      Permission Sought/Granted                  Emotional Assessment              Admission diagnosis:  Confusion [R41.0] Acute kidney injury (nontraumatic) (HCC) [N17.9] AKI (acute kidney injury) (HCC) [N17.9] Elevated random blood glucose level [R73.9] Patient Active Problem List   Diagnosis Date Noted   AKI (acute kidney injury) (HCC) 02/14/2024   Sepsis due to pneumonia (HCC) 09/30/2023   COVID 09/06/2023   COVID-19 virus infection 09/05/2023   Sepsis without acute organ dysfunction (HCC) 09/05/2023   Acute on chronic hypoxic respiratory failure (HCC) 09/05/2023   Dehydration 09/05/2023   Hypotension due to hypovolemia 09/05/2023   SBO (small bowel obstruction) (HCC) 08/11/2023   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   Presence of permanent cardiac pacemaker 08/11/2023   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 [place] [address] Phone: [phone] Fax: [phone]  [facility] LONG - [facility] [address] [place] [address] Phone: [phone] Fax: [phone]     Social Drivers of Health (SDOH) Social History: SDOH Screenings   Food Insecurity: No Food Insecurity (10/14/2023)   Received from [facility]  Housing: Low Risk  (10/14/2023)   Received from [facility]  Transportation Needs: No Transportation Needs (10/14/2023)   Received from [facility]  Utilities: Not At Risk (10/14/2023)   Received from [facility]  Financial Resource Strain: Low Risk  (10/14/2023)   Received from [facility]  Recent Concern: Financial Resource Strain - Medium Risk (07/27/2023)   Received from [facility]  Physical Activity: Insufficiently Active (07/27/2023)   Received from [facility]  Social Connections: Unknown (09/30/2023)  Stress: No Stress Concern Present (07/27/2023)   Received from [facility]  Tobacco Use: Medium Risk (02/14/2024)   SDOH Interventions:  Readmission Risk Interventions    10/02/2023    2:35 PM 09/07/2023    3:27 PM  Readmission Risk Prevention Plan  Transportation Screening Complete Complete  PCP or Specialist Appt within 3-5 Days  Complete  HRI or Home Care Consult Complete Complete  Social Work Consult for Recovery Care Planning/Counseling Complete Complete  Palliative Care Screening Not Applicable Not Applicable  Medication Review Oceanographer) Complete Complete

## 2024-02-14 NOTE — H&P (Signed)
 History and Physical    Patient: Brittany Irwin FMW:[id number] DOB: Apr 05, 1941 DOA: 02/14/2024 DOS: the patient was seen and examined on 02/14/2024 PCP: [name], MD  Patient coming from: Home  Chief Complaint:  Chief Complaint  Patient presents with   Altered Mental Status   HPI: Brittany Irwin is a 83 y.o. 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 She is accompanied by her sister.  She experienced a sudden onset of heart palpitations with a heart rate reaching 140 beats per minute, which is the threshold her cardiologist mentioned as concerning. This episode was accompanied by shortness of breath. She was unable to contact her cardiologist and used her emergency system to call for help. By the time EMS arrived, her heart rate had decreased, and she felt better. She has been experiencing episodes of heart fluttering, which occurred a couple of times last week. She received a letter confirming that her pacemaker was functioning well after a recent interrogation.  She has a history of COPD and has been experiencing wheezing, which was noted by EMS during her visit. She uses oxygen at a rate of 4 liters per minute, occasionally reducing it to 3 liters depending on her condition. No chest pain or vomiting.  She was recently diagnosed with gout in her right foot, which was swollen, red, and painful. She was prescribed medication for gout and is scheduled for a follow-up appointment to conduct lab work. The foot has improved since starting the medication.  She has a history of Crohn's disease and has been experiencing diarrhea without abdominal pain or vomiting. She uses Tylenol  #4 with codeine , which her doctor prescribed to treat her chronic back pain, but  also to help slow her diarrhea. She denies any recent smoking or alcohol  use.  She has a history of kidney issues and has experienced urinary frequency without discomfort, sometimes needing to massage her back to fully void. She was previously on Lasix , which was adjusted due to its impact on her kidneys.  Her sister makes note that the patient had also been confused over the last few days and was repeating herself and not remembering things that had gone on.  Previously when the patient has had infections she has had similar symptoms. Normally patient lives alone and is able to care for herself.    On admission into the emergency department patient was noted to have a temperature of 100 F with tachypnea, O2 saturations maintained on home 4 L of oxygen, and all other vital signs relatively maintained.  Labs significant for WBC 13, BUN 23, creatinine 1.31, and lactic acid 1.5-> 0.9.  Chest x-ray noted mild interstitial thickening thought to possibly related to edema.  Influenza, COVID-19, and RSV screening were negative.  Urinalysis noted moderate leukocytes, specific gravity 1.03, rare bacteria present, non-squamous epithelial cells present, and 0-5 WBCs.  Blood cultures have been obtained.  Urinalysis was pending.  Patient had been given Tylenol  and 1 L of normal saline IV fluids.  Review of Systems: As mentioned in the history of present illness. All other systems reviewed and are negative. Past Medical History:  Diagnosis Date   Acute CHF (congestive heart failure) (HCC) 01/02/2022   AKI (acute kidney injury) (HCC) 09/08/2020   Aspiration pneumonia (HCC) 01/05/2019   Atypical chest pain 03/11/2022   Bradycardia 03/14/2020  C. difficile colitis 04/03/2021   CHF (congestive heart failure) (HCC)    Coronary artery disease    Tachycardia 05/31/2013   Past Surgical History:  Procedure Laterality Date   ABDOMINAL HYSTERECTOMY     BOWEL RESECTION     CHOLECYSTECTOMY     Social History:   reports that she has quit smoking. Her smoking use included cigarettes. She does not have any smokeless tobacco history on file. She reports that she does not currently use alcohol . She reports that she does not use drugs.  Allergies  Allergen Reactions   Methylprednisolone Other (See Comments)    Increased BP, HR, agitation, hallucinations    Shellfish Allergy Nausea And Vomiting    All SEAFOOD   Ativan  [Lorazepam ]     Psychosis   Azulfidine [Sulfasalazine] Other (See Comments)    Headache    Epipen [Epinephrine] Hypertension   Flagyl [Metronidazole] Hives   Motrin [Ibuprofen] Nausea Only and Other (See Comments)    Told not to take medication due to GI distress caused by crohn's disease.   Nsaids Other (See Comments)    Told not to take NSAIDs due to GI distress caused by crohn's disease   Penicillins Hives   Morphine And Codeine  Itching    Told not to take due to GI distress caused by crohn's disease    History reviewed. No pertinent family history.  Prior to Admission medications   Medication Sig Start Date End Date Taking? Authorizing Provider  acetaminophen -codeine  (TYLENOL  #4) 300-60 MG tablet Take 1 tablet by mouth every 6 (six) hours as needed for pain. 12/31/21   [provider]  Aloe-Sodium Chloride  (AYR SALINE NASAL GEL NA) Place 1 Application into the nose 2 (two) times daily as needed (Dry nose).    [provider]  ALPRAZolam  (XANAX ) 1 MG tablet Take 1 mg by mouth 3 (three) times daily as needed for anxiety. 01/02/22   [provider]  atenolol  (TENORMIN ) 25 MG tablet Take half tablet (12.5 mg) by mouth daily. DO NOT take if heart rate is less than 60 BPM Patient taking differently: Take 25 mg by mouth daily. DO NOT take if heart rate is less than 60 BPM 10/21/22   Johnson, Kathleen R, PA-C  bumetanide  (BUMEX ) 1 MG tablet Take 1 tablet (1 mg total) by mouth daily as needed. Patient taking differently: Take 1 mg by mouth daily. 08/15/23   Dahal,  Binaya, MD  cyanocobalamin (,VITAMIN B-12,) 1000 MCG/ML injection Inject 1,000 mcg into the muscle every 30 (thirty) days.    [provider]  cyclobenzaprine  (FLEXERIL ) 5 MG tablet Take 5 mg by mouth 2 (two) times daily as needed for muscle spasms. 10/13/22   [provider]  diclofenac  Sodium (VOLTAREN  ARTHRITIS PAIN) 1 % GEL Apply 1 Application topically 2 (two) times daily as needed (back pain).    [provider]  ergocalciferol (VITAMIN D2) 1.25 MG (50000 UT) capsule Take 50,000 Units by mouth every 14 (fourteen) days. Every other Friday    [provider]  ezetimibe  (ZETIA ) 10 MG tablet Take 10 mg by mouth at bedtime. 12/31/21   [provider]  fenofibrate  micronized (LOFIBRA) 200 MG capsule Take 200 mg by mouth daily. 12/31/21   [provider]  Fluticasone -Umeclidin-Vilant (TRELEGY ELLIPTA ) 200-62.5-25 MCG/ACT AEPB Inhale 1 puff into the lungs daily. 12/22/23     guaiFENesin -dextromethorphan  (ROBITUSSIN DM) 100-10 MG/5ML syrup Take 10 mLs by mouth every 4 (four) hours as needed for cough. 09/11/23   Kc,  Mennie, MD  levalbuterol  (XOPENEX ) 0.63 MG/3ML nebulizer solution Take 3 mLs (0.63 mg total) by nebulization every 4 (four) hours as needed for wheezing or shortness of breath. 09/11/23 10/11/23  Christobal Mennie, MD  loratadine  (CLARITIN ) 10 MG tablet Take 1 tablet (10 mg total) by mouth daily for 14 days. 09/12/23 09/30/23  Christobal Mennie, MD  melatonin 3 MG TABS tablet Take 1 tablet (3 mg total) by mouth at bedtime. Patient taking differently: Take 3 mg by mouth at bedtime as needed (sleep). 08/15/23   DahalChapman, MD  Menthol, Topical Analgesic, (ICY HOT BACK EX) Apply 1 Application topically 2 (two) times daily as needed (back pain).    [provider]  nitroGLYCERIN (NITROSTAT) 0.4 MG SL tablet Place 0.4 mg under the tongue every 5 (five) minutes x 3 doses as needed for chest pain. 12/31/21   [provider]  omeprazole (PRILOSEC) 40  MG capsule Take 40 mg by mouth daily.    [provider]  potassium chloride  SA (KLOR-CON  M) 20 MEQ tablet Take 20 mEq by mouth daily. 12/31/21   [provider]  predniSONE (DELTASONE) 20 MG tablet Take 20 mg by mouth in the morning and at bedtime.    [provider]  pregabalin  (LYRICA ) 50 MG capsule Take 50 mg by mouth 2 (two) times daily.    [provider]    Physical Exam: Vitals:   02/14/24 0530 02/14/24 0545 02/14/24 0600 02/14/24 0700  BP: 106/68 (!) 117/54 (!) 115/57 (!) 112/52  Pulse: 62 (!) 59 (!) 58 60  Resp: 13 20 16 14   Temp:      TempSrc:      SpO2: 95% 96% 95% 95%    Constitutional: Elderly female currently in no acute distress Eyes: PERRL, lids and conjunctivae normal ENMT: Mucous membranes are moist. Posterior pharynx clear of any exudate or lesions.Normal dentition.  Neck: normal, supple, no masses, no thyromegaly Respiratory: Patient noted to have some rhonchi.  No significant wheezes appreciated at this time.  O2 saturations currently maintained on 4 L nasal cannula oxygen. Cardiovascular: Regular rate and rhythm, no murmurs / rubs / gallops. No extremity edema. 2+ pedal pulses. Abdomen: no tenderness, no masses palpated. Bowel sounds positive.  Musculoskeletal: no clubbing / cyanosis. No joint deformity upper and lower extremities. Good ROM, no contractures. Normal muscle tone.  Skin: no rashes, lesions, ulcers. No induration Neurologic: CN 2-12 grossly intact. Strength 5/5 in all 4.  Psychiatric: Was repetitive in statements. Alert and oriented x person and place. Normal mood.   Data Reviewed:  EKG reveals sinus rhythm at 70 bpm with nonspecific IVCD with LAD, but otherwise unchanged from previous tracings.  Reviewed labs, imaging, and pertinent records as documented  Assessment and Plan:  SIRS Patient presented with initial temperature of 100 F, tachypnea, and leukocytosis of 13.  Lactic acid was noted to be reassuring.   Chest x-ray noted interstitial thickening.  Urinalysis was abnormal but may have been secondary to it being contaminated.  Influenza, COVID-19, and RSV screening were negative.  Blood cultures were obtained.  Question possibility of urinary tract infection versus possible upper respiratory infection as a cause for patient's symptoms. - Admit to a medical telemetry bed - Follow-up blood cultures - Check procalcitonin  - Check complete respiratory virus panel - Check C. difficile if diarrhea persists  Acute metabolic encephalopathy Patient noted to be altered and confused, but at baseline alert and oriented able to care for self living at home alone. -  Check CT scan of the head - Neurochecks - Delirium precautions  Abnormal urinalysis Patient reported having incomplete voiding with some frequency.  Urinalysis noted moderate leukocytes, specific gravity 1.03, rare bacteria present, non-squamous epithelial cells present, and 0-5 WBCs.  Question possibility of contaminant versus urinary tract infection. - Check urine culture - Rocephin  IV  Acute kidney injury superimposed on chronic kidney disease stage III Patient presents with creatinine elevated up to 1.31 with BUN 23.  Baseline creatinine previously noted to be 0.88 when last checked on 3/15.  Urinalysis noted elevated specific gravity.  Patient had initially been given 1 L normal saline IV fluids. - Monitor intake and output - Check urine sodium and, urine creatinine, urine urea  - Avoid possible nephrotoxic agents - Recheck kidney function in a.m.  Chronic hypoxic respiratory failure COPD Patient is on 4 L of nasal cannula oxygen at baseline.  Reports having  wheezing for the last couple days, but no significant wheezing noted on physical exam currently.  Chest x-ray noted increased interstitial thickening.  Question possibility of bronchitis versus upper respiratory infection(viral versus bacterial) as a cause of symptoms.  Reports of  follows with Dr. Bascom of pulmonology in the outpatient setting. - Continue home 4 L of oxygen - Follow-up complete respiratory virus panel - Continue pharmacy substitution of Breztri  inhaler - Levalbuterol  nebs as needed shortness of breath/wheezing - Antibiotics as noted above  Heart failure with preserved EF Patient appears to be euvolemic on physical exam.  Chest x-ray noted concern for possibility of edema.  Last available echocardiogram noted EF to be 60 to 65% with grade 1 diastolic dysfunction when checked 09/2022. - Strict I&O's and daily weights - Check BNP - No furosemide .  Reassess and determine when medically appropriate to resume  Tachybradycardia syndrome S/p PPM Patient had a Medtronic pacemaker placed back in 02/2023.  She reports having palpitations with heart rates into the 140s.  EKG revealed a sinus rhythm.  Patient follows with Dr. Luke of cardiology at [facility]. - Nursing care order placed to interrogate Medtronic pacemaker  Chronic back pain - Continue pharmacy substitution Tylenol  #3  as needed for pain  History of Crohn's disease Patient reports having diarrhea for the last couple of days.  Records noted history of C. difficile. - Monitor intake and output  Neuropathy - Continue Lyrica   Anxiety Chronic. - Continue Xanax  1 mg p.o. twice daily as needed  Hyperlipidemia - Continue fenofibrate  and Zetia   GERD - Continue PPI   DVT prophylaxis: Lovenox  Advance Care Planning:   Code Status: Full Code   Consults: none  Family Communication: Patient Sister updated at bedside  Severity of Illness: The appropriate patient status for this patient is OBSERVATION. Observation status is judged to be reasonable and necessary in order to provide the required intensity of service to ensure the patient's safety. The patient's presenting symptoms, physical exam findings, and initial radiographic and laboratory data in the context of their medical condition is  felt to place them at decreased risk for further clinical deterioration. Furthermore, it is anticipated that the patient will be medically stable for discharge from the hospital within 2 midnights of admission.   Author: Maximino DELENA Sharps, MD 02/14/2024 7:18 AM  For on call review [email or web address].

## 2024-02-14 NOTE — Progress Notes (Signed)
  Carryover admission to the Day Admitter.  I discussed this case with the EDP, Dr. Raford.  Per these discussions:   This is a 83 year old female with chronic hypoxic respiratory failure on continuous 4 L nasal cannula at baseline, who is being admitted for acute kidney injury after presenting from SNF for evaluation of report of hypoxia while on her 4 L nasal cannula.   In the ED, she is maintaining oxygen saturations in the mid 90s on her baseline 4 L nasal cannula, without any reported or documented hypoxia.  She is noted to have been hospitalized in March 2025 for sepsis due to pneumonia.  Per my discussions with the EDP, she is currently confused, although baseline mental status is unclear.  She knows that she is in the hospital, but is unsure as to the month of the year.  Vital signs in the ED were also notable for temperature max 100.0.  Labs notable for creatinine of 1.31 compared to most recent prior value of 0.88.  COVID, influenza, RSV PCR were all negative.  Lactic acid was nonelevated.  CBC showed white blood cell count 13,000.  Urinalysis is pending.  Chest x-ray is reported to show no evidence of acute process, including no evidence of infiltrate.  Regarding her acute kidney injury, urinalysis is pending, and she has received a liter of IV fluid.  I have placed an order for observation for further evaluation management of acute kidney injury.  I have placed some additional preliminary admit orders via the adult multi-morbid admission order set. I defer decision making regarding potential additional IV fluids to the admitting hospitalist.   Eva Pore, DO Hospitalist

## 2024-02-14 NOTE — ED Notes (Signed)
 This nurse called CCMD to have patient monitored and admitted.

## 2024-02-14 NOTE — Plan of Care (Signed)
  Problem: Pain Managment: Goal: General experience of comfort will improve and/or be controlled Outcome: Progressing   Problem: Safety: Goal: Ability to remain free from injury will improve Outcome: Progressing   Problem: Skin Integrity: Goal: Risk for impaired skin integrity will decrease Outcome: Progressing

## 2024-02-15 DIAGNOSIS — R8290 Unspecified abnormal findings in urine: Secondary | ICD-10-CM | POA: Diagnosis not present

## 2024-02-15 DIAGNOSIS — G9341 Metabolic encephalopathy: Secondary | ICD-10-CM | POA: Diagnosis not present

## 2024-02-15 DIAGNOSIS — R6510 Systemic inflammatory response syndrome (SIRS) of non-infectious origin without acute organ dysfunction: Secondary | ICD-10-CM | POA: Diagnosis not present

## 2024-02-15 DIAGNOSIS — N179 Acute kidney failure, unspecified: Secondary | ICD-10-CM | POA: Diagnosis not present

## 2024-02-15 LAB — TSH: TSH: 1.412 u[IU]/mL (ref 0.350–4.500)

## 2024-02-15 LAB — BASIC METABOLIC PANEL WITH GFR
Anion gap: 8 (ref 5–15)
BUN: 19 mg/dL (ref 8–23)
CO2: 24 mmol/L (ref 22–32)
Calcium: 8.5 mg/dL — ABNORMAL LOW (ref 8.9–10.3)
Chloride: 104 mmol/L (ref 98–111)
Creatinine, Ser: 0.86 mg/dL (ref 0.44–1.00)
GFR, Estimated: >60 mL/min (ref >60)
Glucose, Bld: 96 mg/dL (ref 70–99)
Potassium: 4.4 mmol/L (ref 3.5–5.1)
Sodium: 136 mmol/L (ref 135–145)

## 2024-02-15 LAB — CBC
HCT: 36.9 % (ref 36.0–46.0)
Hemoglobin: 11.8 g/dL — ABNORMAL LOW (ref 12.0–15.0)
MCH: 29.6 pg (ref 26.0–34.0)
MCHC: 32 g/dL (ref 30.0–36.0)
MCV: 92.5 fL (ref 80.0–100.0)
Platelets: 148 K/uL — ABNORMAL LOW (ref 150–400)
RBC: 3.99 MIL/uL (ref 3.87–5.11)
RDW: 14.1 % (ref 11.5–15.5)
WBC: 4.2 K/uL (ref 4.0–10.5)
nRBC: 0 % (ref 0.0–0.2)

## 2024-02-15 LAB — URINE CULTURE

## 2024-02-15 LAB — UREA NITROGEN, URINE: Urea Nitrogen, Ur: 556 mg/dL

## 2024-02-15 MED ORDER — PANTOPRAZOLE SODIUM 40 MG PO TBEC
40.0000 mg | DELAYED_RELEASE_TABLET | Freq: Every day | ORAL | Status: DC
  Administered 2024-02-15: 40 mg via ORAL
  Filled 2024-02-15: qty 1

## 2024-02-15 MED ORDER — BUMETANIDE 1 MG PO TABS: 1.0000 mg | ORAL_TABLET | Freq: Every day | ORAL | Status: DC | PRN

## 2024-02-15 MED ORDER — ATENOLOL 25 MG PO TABS: 25.0000 mg | ORAL_TABLET | Freq: Every day | ORAL | 0 refills | Status: AC

## 2024-02-15 MED ORDER — ONDANSETRON HCL 4 MG PO TABS: 4.0000 mg | ORAL_TABLET | Freq: Four times a day (QID) | ORAL | 0 refills | Status: DC | PRN

## 2024-02-15 MED ORDER — CEFDINIR 300 MG PO CAPS: 300.0000 mg | ORAL_CAPSULE | Freq: Two times a day (BID) | ORAL | 0 refills | Status: AC

## 2024-02-15 NOTE — Progress Notes (Addendum)
 Holli BIRCH Furness to be D/C'd Home per MD order.  Discussed with the patient and all questions fully answered.  VSS, Skin clean, dry and intact without evidence of skin break down, no evidence of skin tears noted. IV catheter discontinued intact. Site without signs and symptoms of complications. Dressing and pressure applied.  An After Visit Summary was printed and given to the patient. Patient prescriptions sent to pharmacy.  D/c education completed with patient/family including follow up instructions, medication list, d/c activities limitations if indicated, with other d/c instructions as indicated by MD - patient able to verbalize understanding, all questions fully answered.   Patient instructed to return to ED, call 911, or call MD for any changes in condition.   Patient escorted via [facility] to discharge lounge to wait for ride home.  Ileana LITTIE Gainer 02/15/2024 5:08 PM

## 2024-02-15 NOTE — Evaluation (Signed)
 Physical Therapy Evaluation Patient Details Name: Brittany Irwin MRN: [id number] DOB: June 28, 1941 Today's Date: 02/15/2024  History of Present Illness  Pt is an 83 y/o F presenting to ED on 7/28 with SOB and tachycardia, found to have AKI. PMH:  PNA 3/25, Crohns, hypertension, chronic diastolic CHF, chronic disease, CKD 3A, chronic back pain, anemia, COPD, chronic respiratory failure with hypoxia, SBO, psoriasis, migraines, and anxiety.  Clinical Impression  Pt admitted with above diagnosis. Pt from home alone with transportation assistance from her sister. She has recently had multiple challenges including a gout flare, her back pain being exacerbated as well as her Crohn's and then this SOB and tachycardia. Pt on 4L O2 at baseline. On eval, pt reports feeling better and tolerated mobility with use of rollator in hallway with 4L O2 and CGA. SPO2 remained in mid 90's and HR remained in 80's. Discussed energy conservation techniques with mobility. Will follow acutely but do not anticipate her needing PT at d/c.  Pt currently with functional limitations due to the deficits listed below (see PT Problem List). Pt will benefit from acute skilled PT to increase their independence and safety with mobility to allow discharge.           If plan is discharge home, recommend the following: Assistance with cooking/housework   Can travel by private vehicle        Equipment Recommendations None recommended by PT  Recommendations for Other Services       Functional Status Assessment Patient has had a recent decline in their functional status and demonstrates the ability to make significant improvements in function in a reasonable and predictable amount of time.     Precautions / Restrictions Precautions Precautions: Fall Restrictions Weight Bearing Restrictions Per Provider Order: No      Mobility  Bed Mobility Overal bed mobility: Independent                  Transfers Overall  transfer level: Modified independent Equipment used: Rolling walker (2 wheels)                    Ambulation/Gait Ambulation/Gait assistance: Contact guard assist Gait Distance (Feet): 100 Feet (3x) Assistive device: Rolling walker (2 wheels), Rollator (4 wheels) Gait Pattern/deviations: Step-through pattern Gait velocity: decreased Gait velocity interpretation: >2.62 ft/sec, indicative of community ambulatory   General Gait Details: began with RW but pt with decreased gait speed and seemed uncomfortable with it. Changed to rollator and she was able to ambulate faster with smoother gait as well as having the ability to take 2 seated rest breaks  Stairs            Wheelchair Mobility     Tilt Bed    Modified Rankin (Stroke Patients Only)       Balance Overall balance assessment: Needs assistance Sitting-balance support: Feet supported Sitting balance-Leahy Scale: Good     Standing balance support: Reliant on assistive device for balance, During functional activity Standing balance-Leahy Scale: Fair Standing balance comment: mildly unsteady in unsupported stance                             Pertinent Vitals/Pain Pain Assessment Pain Assessment: No/denies pain    Home Living Family/patient expects to be discharged to:: Private residence Living Arrangements: Alone Available Help at Discharge: Family;Available PRN/intermittently (sister) Type of Home: Apartment (senior living community) Home Access: Level entry       Home  Layout: One level Home Equipment: Agricultural consultant (2 wheels);Rollator (4 wheels);Grab bars - tub/shower;Hand held shower head Additional Comments: Pt reports plans for a shower chair.    Prior Function Prior Level of Function : Independent/Modified Independent;Driving             Mobility Comments: 4L O2 baseline, needs rollator at times ADLs Comments: sister provides transportation, ind with ADLs, cooks      Extremity/Trunk Assessment   Upper Extremity Assessment Upper Extremity Assessment: Defer to OT evaluation    Lower Extremity Assessment Lower Extremity Assessment: Generalized weakness    Cervical / Trunk Assessment Cervical / Trunk Assessment: Normal  Communication   Communication Communication: No apparent difficulties    Cognition Arousal: Alert Behavior During Therapy: WFL for tasks assessed/performed   PT - Cognitive impairments: No apparent impairments                         Following commands: Intact       Cueing Cueing Techniques: Verbal cues     General Comments General comments (skin integrity, edema, etc.): pt with noted SOB when ambulating and speaking, cued to do one or the other at a time. SPO2 remained mid 90's on 4L O2 and HR remained in 80's. Pt has a h/o back pain and discussed this in relation to posture with mobiltiy    Exercises     Assessment/Plan    PT Assessment Patient needs continued PT services  PT Problem List Cardiopulmonary status limiting activity;Decreased mobility;Decreased activity tolerance;Decreased strength       PT Treatment Interventions DME instruction;Gait training;Functional mobility training;Therapeutic activities;Therapeutic exercise;Balance training;Patient/family education    PT Goals (Current goals can be found in the Care Plan section)  Acute Rehab PT Goals Patient Stated Goal: return home PT Goal Formulation: With patient Time For Goal Achievement: 02/29/24 Potential to Achieve Goals: Good    Frequency Min 1X/week     Co-evaluation               AM-PAC PT 6 Clicks Mobility  Outcome Measure Help needed turning from your back to your side while in a flat bed without using bedrails?: None Help needed moving from lying on your back to sitting on the side of a flat bed without using bedrails?: None Help needed moving to and from a bed to a chair (including a wheelchair)?: None Help  needed standing up from a chair using your arms (e.g., wheelchair or bedside chair)?: None Help needed to walk in hospital room?: A Little Help needed climbing 3-5 steps with a railing? : A Lot 6 Click Score: 21    End of Session Equipment Utilized During Treatment: Gait belt;Oxygen Activity Tolerance: Patient tolerated treatment well Patient left: in chair;with call bell/phone within reach Nurse Communication: Mobility status PT Visit Diagnosis: Unsteadiness on feet (R26.81)    Time: 8646-8577 PT Time Calculation (min) (ACUTE ONLY): 29 min   Charges:   PT Evaluation $PT Eval Moderate Complexity: 1 Mod PT Treatments $Gait Training: 8-22 mins PT General Charges $$ ACUTE PT VISIT: 1 Visit         Richerd Lipoma, PT  Acute Rehab Services Secure chat preferred Office [phone]   Richerd CROME Chadley Dziedzic 02/15/2024, 2:45 PM

## 2024-02-15 NOTE — Hospital Course (Signed)
 HPI per Dr. Maximino Sharps on 7/28:  Brittany Irwin is a 83 y.o. 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 She is accompanied by her sister.   She experienced a sudden onset of heart palpitations with a heart rate reaching 140 beats per minute, which is the threshold her cardiologist mentioned as concerning. This episode was accompanied by shortness of breath. She was unable to contact her cardiologist and used her emergency system to call for help. By the time EMS arrived, her heart rate had decreased, and she felt better. She has been experiencing episodes of heart fluttering, which occurred a couple of times last week. She received a letter confirming that her pacemaker was functioning well after a recent interrogation.   She has a history of COPD and has been experiencing wheezing, which was noted by EMS during her visit. She uses oxygen at a rate of 4 liters per minute, occasionally reducing it to 3 liters depending on her condition. No chest pain or vomiting.   She was recently diagnosed with gout in her right foot, which was swollen, red, and painful. She was prescribed medication for gout and is scheduled for a follow-up appointment to conduct lab work. The foot has improved since starting the medication.   She has a history of Crohn's disease and has been experiencing diarrhea without abdominal pain or vomiting. She uses Tylenol  #4 with codeine , which her doctor prescribed to treat her chronic back pain, but also to help slow her diarrhea. She denies any recent smoking or alcohol  use.   She has a history of kidney issues and has experienced urinary frequency without discomfort, sometimes needing to massage her back to fully void. She was previously on Lasix , which  was adjusted due to its impact on her kidneys.  Her sister makes note that the patient had also been confused over the last few days and was repeating herself and not remembering things that had gone on.  Previously when the patient has had infections she has had similar symptoms. Normally patient lives alone and is able to care for herself.     On admission into the emergency department patient was noted to have a temperature of 100 F with tachypnea, O2 saturations maintained on home 4 L of oxygen, and all other vital signs relatively maintained.  Labs significant for WBC 13, BUN 23, creatinine 1.31, and lactic acid 1.5-> 0.9.  Chest x-ray noted mild interstitial thickening thought to possibly related to edema.  Influenza, COVID-19, and RSV screening were negative.  Urinalysis noted moderate leukocytes, specific gravity 1.03, rare bacteria present, non-squamous epithelial cells present, and 0-5 WBCs.  Blood cultures have been obtained.  Urinalysis was pending.  Patient had been given Tylenol  and 1 L of normal saline IV fluids.  **Interim History Was hydrated and her renal function improved.  She was changed to p.o. cefdinir .  She ambulated with PT OT who recommended follow-up and she did not desaturate.  She is medically stable and her symptoms are resolved and she will follow-up with PCP or cardiologist in the outpatient setting.  Assessment and Plan:  SIRS: Patient presented with initial temperature of 100 F, tachypnea, and leukocytosis of 13.  Lactic acid was noted to be reassuring.  Chest x-ray noted interstitial thickening.  Urinalysis was abnormal but may have been secondary  to it being contaminated.  Influenza, COVID-19, and RSV screening were negative.  Blood cultures were obtained and showed NGTD @ 1 Daay.  Question possibility of urinary tract infection versus possible upper respiratory infection as a cause for patient's symptoms.  Check procalcitonin and <0.10. Improved and back to baseline.  Likely Dehydrated. Labs stable   Acute metabolic encephalopathy, Improved. Patient noted to be altered and confused, but at baseline alert and oriented able to care for self living at home alone. Checked CT scan of the head and showed No evidence of acute intracranial abnormality. C/w Neurochecks and Delirium precautions. Back to Baseline   Abnormal urinalysis w/ concern for UTI: Patient reported having incomplete voiding with some frequency.  Urinalysis noted moderate leukocytes, specific gravity 1.03, rare bacteria present, non-squamous epithelial cells present, and 0-5 WBCs.  Question possibility of contaminant versus urinary tract infection. Check urine culture and showed multiple species. Contineud Rocephin  IV and changed to po Cefdinir  for 5 days total   Acute kidney injury superimposed on chronic kidney disease stage IIIa: Patient presents with creatinine elevated up to 1.31 with BUN 23.  Baseline creatinine previously noted to be 0.88 when last checked on 3/15.  Urinalysis noted elevated specific gravity.  Patient had initially been given 1 L normal saline IV fluids.Monitor intake and output - Check urine sodium and, urine creatinine, urine urea  -BUN/Cr Trend: Recent Labs  Lab 02/14/24 0337 02/15/24 0620  BUN 23 19  CREATININE 1.31* 0.86  -Avoid Nephrotoxic Medications, Contrast Dyes, Hypotension and Dehydration to Ensure Adequate Renal Perfusion and will need to Renally Adjust Meds -Continue to Monitor and Trend Renal Function carefully and repeat CMP w/in 1 week  Chronic hypoxic respiratory failure / COPD Patient is on 4 L of nasal cannula oxygen at baseline.  Reports having  wheezing for the last couple days, but no significant wheezing noted on physical exam currently.  Chest x-ray noted increased interstitial thickening.  Question possibility of bronchitis versus upper respiratory infection(viral versus bacterial) as a cause of symptoms.  Reports of follows with Dr. Bascom of  pulmonology in the outpatient setting. -Continue home 4 L of oxygen -Follow-up complete respiratory virus panel Negative.  -Continue pharmacy substitution of Breztri  inhaler; resume Trelegy @ Discharge -Levalbuterol  nebs as needed shortness of breath/wheezing - Antibiotics as noted above and change to po Cefdinir    Heart failure with preserved EF:  Patient appears to be euvolemic on physical exam.  Chest x-ray noted concern for possibility of edema.  Last available echocardiogram noted EF to be 60 to 65% with grade 1 diastolic dysfunction when checked 09/2022. - Strict I&O's and daily weights -Checked BNP and was 742.6 - No furosemide .  Reassess and determine when medically appropriate to resume for Bumex  and can follow up in the outpt setting   Tachybradycardia syndrome S/p PPM: Patient had a Medtronic pacemaker placed back in 02/2023.  She reports having palpitations with heart rates into the 140s.  EKG revealed a sinus rhythm.  Patient follows with Dr. Luke of cardiology at [facility]. Nursing care order placed to interrogate Medtronic pacemaker. Can be followed in the outpatient setting    Chronic back pain: - Continue pharmacy substitution Tylenol  #3  as needed for pain   History of Crohn's disease: Patient reports having diarrhea for the last couple of days.  Records noted history of C. Difficile and this was negative. Diarrhea is improved.  Monitor intake and output   Neuropathy: Continue Lyrica    Anxiety: Chronic. Continue Xanax  1  mg p.o. twice daily as needed   Hyperlipidemia: - Continue fenofibrate  and Zetia    GERD:  Continue PPI

## 2024-02-15 NOTE — Evaluation (Signed)
 Occupational Therapy Evaluation Patient Details Name: Brittany Irwin MRN: [id number] DOB: 21-Apr-1941 Today's Date: 02/15/2024   History of Present Illness   Pt is an 83 y/o F presenting to ED on 7/28 with SOB and tachycardia, found to have AKI. PMH:  PNA 3/25, Crohns, hypertension, chronic diastolic CHF, chronic disease, CKD 3A, chronic back pain, anemia, COPD, chronic respiratory failure with hypoxia, SBO, psoriasis, migraines, and anxiety.     Clinical Impressions Pt ind at baseline with ADLs and uses rollator PRN for functional mobility, lives alone but reports her sister provides transportation and frequent assist for her. Pt on 4L O2 baseline, needs supervision for ADLs, ind with bed mobility and mod I for transfers with RW. Pt with SpO2 in 90s throughout session with short distance mobility in room, and pt with x1 SOB instance with reaching down to don socks. Pt presenting with impairments listed below, will follow acutely. Anticipate no OT follow up needs at d/c.      If plan is discharge home, recommend the following:   A little help with walking and/or transfers;A little help with bathing/dressing/bathroom;Assistance with cooking/housework;Assist for transportation     Functional Status Assessment   Patient has had a recent decline in their functional status and demonstrates the ability to make significant improvements in function in a reasonable and predictable amount of time.     Equipment Recommendations   Tub/shower seat     Recommendations for Other Services   PT consult     Precautions/Restrictions   Precautions Precautions: Fall Restrictions Weight Bearing Restrictions Per Provider Order: No     Mobility Bed Mobility Overal bed mobility: Independent                  Transfers Overall transfer level: Modified independent Equipment used: Rolling walker (2 wheels)                      Balance Overall balance assessment: Needs  assistance Sitting-balance support: Feet supported Sitting balance-Leahy Scale: Good     Standing balance support: Reliant on assistive device for balance, During functional activity Standing balance-Leahy Scale: Fair                             ADL either performed or assessed with clinical judgement   ADL Overall ADL's : Needs assistance/impaired Eating/Feeding: Supervision/ safety   Grooming: Supervision/safety   Upper Body Bathing: Supervision/ safety   Lower Body Bathing: Supervison/ safety   Upper Body Dressing : Supervision/safety   Lower Body Dressing: Supervision/safety   Toilet Transfer: Supervision/safety   Toileting- Clothing Manipulation and Hygiene: Supervision/safety       Functional mobility during ADLs: Supervision/safety       Vision   Vision Assessment?: No apparent visual deficits Additional Comments: hx of cataract surgery     Perception Perception: Not tested       Praxis Praxis: Not tested       Pertinent Vitals/Pain Pain Assessment Pain Assessment: No/denies pain     Extremity/Trunk Assessment Upper Extremity Assessment Upper Extremity Assessment: Generalized weakness   Lower Extremity Assessment Lower Extremity Assessment: Defer to PT evaluation   Cervical / Trunk Assessment Cervical / Trunk Assessment: Normal   Communication Communication Communication: No apparent difficulties   Cognition Arousal: Alert Behavior During Therapy: WFL for tasks assessed/performed Cognition: No apparent impairments  Following commands: Intact       Cueing  General Comments   Cueing Techniques: Verbal cues  VSS on RA   Exercises     Shoulder Instructions      Home Living Family/patient expects to be discharged to:: Private residence Living Arrangements: Alone Available Help at Discharge: Family;Available PRN/intermittently (sister) Type of Home: Apartment (senior living  community) Home Access: Level entry     Home Layout: One level     Bathroom Shower/Tub: Chief Strategy Officer: Standard     Home Equipment: Agricultural consultant (2 wheels);Rollator (4 wheels);Grab bars - tub/shower;Hand held shower head          Prior Functioning/Environment Prior Level of Function : Independent/Modified Independent;Driving             Mobility Comments: 4L O2 baseline, needs rollator at times ADLs Comments: sister provides transportation, ind with ADLs, cooks    OT Problem List: Decreased activity tolerance;Impaired balance (sitting and/or standing)   OT Treatment/Interventions: Self-care/ADL training;Therapeutic exercise;Energy conservation;DME and/or AE instruction;Balance training      OT Goals(Current goals can be found in the care plan section)   Acute Rehab OT Goals Patient Stated Goal: did not state OT Goal Formulation: With patient Time For Goal Achievement: 02/29/24 Potential to Achieve Goals: Good ADL Goals Pt Will Perform Upper Body Dressing: with supervision;sitting Pt Will Perform Lower Body Dressing: with supervision;sitting/lateral leans;sit to/from stand Pt Will Transfer to Toilet: with supervision;ambulating;regular height toilet Pt Will Perform Tub/Shower Transfer: Tub transfer;Shower transfer;with supervision;ambulating Additional ADL Goal #1: Pt will tolerate x15 min OOB standing activity in order to improve activity tolerance for ADLs   OT Frequency:  Min 1X/week    Co-evaluation              AM-PAC OT 6 Clicks Daily Activity     Outcome Measure Help from another person eating meals?: A Little Help from another person taking care of personal grooming?: A Little Help from another person toileting, which includes using toliet, bedpan, or urinal?: A Little Help from another person bathing (including washing, rinsing, drying)?: A Little Help from another person to put on and taking off regular upper body  clothing?: A Little Help from another person to put on and taking off regular lower body clothing?: A Little 6 Click Score: 18   End of Session Equipment Utilized During Treatment: Rolling walker (2 wheels);Gait belt Nurse Communication: Mobility status  Activity Tolerance: Patient tolerated treatment well Patient left: Other (comment) (handoff to PT in doorway)  OT Visit Diagnosis: Unsteadiness on feet (R26.81);Other abnormalities of gait and mobility (R26.89);Muscle weakness (generalized) (M62.81)                Time: 8661-8641 OT Time Calculation (min): 20 min Charges:  OT General Charges $OT Visit: 1 Visit OT Evaluation $OT Eval Low Complexity: 1 Low  Jeananne Bedwell K, OTD, OTR/L SecureChat Preferred Acute Rehab [phone]   Crystel Demarco K Koonce 02/15/2024, 2:09 PM

## 2024-02-15 NOTE — Discharge Summary (Signed)
 Physician Discharge Summary   Patient: Brittany Irwin MRN: [id number] DOB: September 11, 1940  Admit date:     02/14/2024  Discharge date: 02/15/2024  Discharge Physician: Alejandro Marker, DO   PCP: [name], MD   Recommendations at discharge:   Follow up with PCP within 1 to 2 weeks repeat CBC, CMP, mag, Phos within 1 week Follow-up with cardiology outpatient setting within 1 to 2 weeks  Discharge Diagnoses: Active Problems:   SIRS (systemic inflammatory response syndrome) (HCC)   Acute metabolic encephalopathy   Abnormal urinalysis   Acute kidney injury superimposed on chronic kidney disease (HCC)   COPD (chronic obstructive pulmonary disease) (HCC)   Chronic respiratory failure with hypoxia (HCC)   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 [facility] Course: HPI per Dr. Maximino Sharps on 7/28:  Brittany Irwin is a 83 y.o. 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 She is accompanied by her sister.   She experienced a sudden onset of heart palpitations with a heart rate reaching 140 beats per minute, which is the threshold her cardiologist mentioned as concerning. This episode was accompanied by shortness of breath. She was unable to contact her cardiologist and used her emergency system to call for help. By the time EMS arrived, her heart rate had decreased, and she felt better. She has been experiencing episodes of heart fluttering, which occurred a couple of times last week. She received a letter confirming that her  pacemaker was functioning well after a recent interrogation.   She has a history of COPD and has been experiencing wheezing, which was noted by EMS during her visit. She uses oxygen at a rate of 4 liters per minute, occasionally reducing it to 3 liters depending on her condition. No chest pain or vomiting.   She was recently diagnosed with gout in her right foot, which was swollen, red, and painful. She was prescribed medication for gout and is scheduled for a follow-up appointment to conduct lab work. The foot has improved since starting the medication.   She has a history of Crohn's disease and has been experiencing diarrhea without abdominal pain or vomiting. She uses Tylenol  #4 with codeine , which her doctor prescribed to treat her chronic back pain, but also to help slow her diarrhea. She denies any recent smoking or alcohol  use.   She has a history of kidney issues and has experienced urinary frequency without discomfort, sometimes needing to massage her back to fully void. She was previously on Lasix , which was adjusted due to its impact on her kidneys.  Her sister makes note that the patient had also been confused over the last few days and was repeating herself and not remembering things that had gone on.  Previously when the patient has had infections she has had similar symptoms. Normally patient lives alone and is able to care for herself.     On admission into the emergency department patient was noted to have a temperature of 100 F with tachypnea, O2 saturations maintained on home 4 L of oxygen, and all other vital signs  relatively maintained.  Labs significant for WBC 13, BUN 23, creatinine 1.31, and lactic acid 1.5-> 0.9.  Chest x-ray noted mild interstitial thickening thought to possibly related to edema.  Influenza, COVID-19, and RSV screening were negative.  Urinalysis noted moderate leukocytes, specific gravity 1.03, rare bacteria present, non-squamous epithelial cells present, and 0-5  WBCs.  Blood cultures have been obtained.  Urinalysis was pending.  Patient had been given Tylenol  and 1 L of normal saline IV fluids.  **Interim History Was hydrated and her renal function improved.  She was changed to p.o. cefdinir .  She ambulated with PT OT who recommended follow-up and she did not desaturate.  She is medically stable and her symptoms are resolved and she will follow-up with PCP or cardiologist in the outpatient setting.  Assessment and Plan:  SIRS: Patient presented with initial temperature of 100 F, tachypnea, and leukocytosis of 13.  Lactic acid was noted to be reassuring.  Chest x-ray noted interstitial thickening.  Urinalysis was abnormal but may have been secondary to it being contaminated.  Influenza, COVID-19, and RSV screening were negative.  Blood cultures were obtained and showed NGTD @ 1 Daay.  Question possibility of urinary tract infection versus possible upper respiratory infection as a cause for patient's symptoms.  Check procalcitonin and <0.10. Improved and back to baseline. Likely Dehydrated. Labs stable   Acute metabolic encephalopathy, Improved. Patient noted to be altered and confused, but at baseline alert and oriented able to care for self living at home alone. Checked CT scan of the head and showed No evidence of acute intracranial abnormality. C/w Neurochecks and Delirium precautions. Back to Baseline   Abnormal urinalysis w/ concern for UTI: Patient reported having incomplete voiding with some frequency.  Urinalysis noted moderate leukocytes, specific gravity 1.03, rare bacteria present, non-squamous epithelial cells present, and 0-5 WBCs.  Question possibility of contaminant versus urinary tract infection. Check urine culture and showed multiple species. Contineud Rocephin  IV and changed to po Cefdinir  for 5 days total   Acute kidney injury superimposed on chronic kidney disease stage IIIa: Patient presents with creatinine elevated up to 1.31 with BUN 23.   Baseline creatinine previously noted to be 0.88 when last checked on 3/15.  Urinalysis noted elevated specific gravity.  Patient had initially been given 1 L normal saline IV fluids.Monitor intake and output - Check urine sodium and, urine creatinine, urine urea  -BUN/Cr Trend: Recent Labs  Lab 02/14/24 0337 02/15/24 0620  BUN 23 19  CREATININE 1.31* 0.86  -Avoid Nephrotoxic Medications, Contrast Dyes, Hypotension and Dehydration to Ensure Adequate Renal Perfusion and will need to Renally Adjust Meds -Continue to Monitor and Trend Renal Function carefully and repeat CMP w/in 1 week  Chronic hypoxic respiratory failure / COPD Patient is on 4 L of nasal cannula oxygen at baseline.  Reports having  wheezing for the last couple days, but no significant wheezing noted on physical exam currently.  Chest x-ray noted increased interstitial thickening.  Question possibility of bronchitis versus upper respiratory infection(viral versus bacterial) as a cause of symptoms.  Reports of follows with Dr. Bascom of pulmonology in the outpatient setting. -Continue home 4 L of oxygen -Follow-up complete respiratory virus panel Negative.  -Continue pharmacy substitution of Breztri  inhaler; resume Trelegy @ Discharge -Levalbuterol  nebs as needed shortness of breath/wheezing - Antibiotics as noted above and change to po Cefdinir    Heart failure with preserved EF:  Patient appears to be euvolemic on physical exam.  Chest x-ray noted concern for possibility of  edema.  Last available echocardiogram noted EF to be 60 to 65% with grade 1 diastolic dysfunction when checked 09/2022. - Strict I&O's and daily weights -Checked BNP and was 742.6 - No furosemide .  Reassess and determine when medically appropriate to resume for Bumex  and can follow up in the outpt setting   Tachybradycardia syndrome S/p PPM: Patient had a Medtronic pacemaker placed back in 02/2023.  She reports having palpitations with heart rates into the  140s.  EKG revealed a sinus rhythm.  Patient follows with Dr. Luke of cardiology at [facility]. Nursing care order placed to interrogate Medtronic pacemaker. Can be followed in the outpatient setting    Chronic back pain: - Continue pharmacy substitution Tylenol  #3  as needed for pain   History of Crohn's disease: Patient reports having diarrhea for the last couple of days.  Records noted history of C. Difficile and this was negative. Diarrhea is improved.  Monitor intake and output   Neuropathy: Continue Lyrica    Anxiety: Chronic. Continue Xanax  1 mg p.o. twice daily as needed   Hyperlipidemia: - Continue fenofibrate  and Zetia    GERD:  Continue PPI   Consultants: None Procedures performed: As delineated as above  Disposition: Home Diet recommendation:  Discharge Diet Orders (From admission, onward)     Start     Ordered   02/15/24 0000  Diet - low sodium heart healthy        02/15/24 1634           Cardiac diet DISCHARGE MEDICATION: Allergies as of 02/15/2024       Reactions   Methylprednisolone Other (See Comments)   Increased BP, HR, agitation, hallucinations    Shellfish Allergy Nausea And Vomiting   All SEAFOOD   Ativan  [lorazepam ] Other (See Comments)   Psychosis   Azulfidine [sulfasalazine] Other (See Comments)   Headache    Epipen [epinephrine] Hypertension   Flagyl [metronidazole] Hives   Motrin [ibuprofen] Nausea Only, Other (See Comments)   Told not to take medication due to GI distress caused by crohn's disease.   Nsaids Other (See Comments)   Told not to take NSAIDs due to GI distress caused by crohn's disease   Penicillins Hives   12/2021, 11/2023 tolerated cephalosporins    Morphine And Codeine  Itching   Told not to take due to GI distress caused by crohn's disease        Medication List     STOP taking these medications    Breo Ellipta  100-25 MCG/ACT Aepb Generic drug: fluticasone  furoate-vilanterol   colchicine 0.6 MG tablet    potassium chloride  SA 20 MEQ tablet Commonly known as: KLOR-CON  M   predniSONE 20 MG tablet Commonly known as: DELTASONE       TAKE these medications    acetaminophen -codeine  300-60 MG tablet Commonly known as: TYLENOL  #4 Take 1 tablet by mouth every 6 (six) hours as needed for pain.   ALPRAZolam  1 MG tablet Commonly known as: XANAX  Take 1 mg by mouth 2 (two) times daily.   atenolol  25 MG tablet Commonly known as: Tenormin  Take 1 tablet (25 mg total) by mouth daily. Start taking on: February 16, 2024 What changed:  how much to take how to take this when to take this additional instructions   bumetanide  1 MG tablet Commonly known as: BUMEX  Take 1 tablet (1 mg total) by mouth daily as needed. What changed:  when to take this reasons to take this   cefdinir  300 MG capsule Commonly known as:  OMNICEF  Take 1 capsule (300 mg total) by mouth 2 (two) times daily for 2 days.   cyanocobalamin 1000 MCG/ML injection Commonly known as: VITAMIN B12 Inject 1,000 mcg into the muscle every 30 (thirty) days.   cyclobenzaprine  5 MG tablet Commonly known as: FLEXERIL  Take 5 mg by mouth daily as needed for muscle spasms.   ergocalciferol 1.25 MG (50000 UT) capsule Commonly known as: VITAMIN D2 Take 50,000 Units by mouth every 14 (fourteen) days. Every other Friday   ezetimibe  10 MG tablet Commonly known as: ZETIA  Take 10 mg by mouth at bedtime.   fenofibrate  micronized 200 MG capsule Commonly known as: LOFIBRA Take 200 mg by mouth daily.   ICY HOT BACK EX Apply 1 Application topically 2 (two) times daily as needed (back pain).   levalbuterol  0.63 MG/3ML nebulizer solution Commonly known as: XOPENEX  Take 3 mLs (0.63 mg total) by nebulization every 4 (four) hours as needed for wheezing or shortness of breath.   loratadine  10 MG tablet Commonly known as: CLARITIN  Take 1 tablet (10 mg total) by mouth daily for 14 days. What changed:  when to take this reasons to take  this   nitroGLYCERIN 0.4 MG SL tablet Commonly known as: NITROSTAT Place 0.4 mg under the tongue every 5 (five) minutes x 3 doses as needed for chest pain.   omeprazole 40 MG capsule Commonly known as: PRILOSEC Take 40 mg by mouth daily.   ondansetron  4 MG tablet Commonly known as: ZOFRAN  Take 1 tablet (4 mg total) by mouth every 6 (six) hours as needed for nausea.   pregabalin  50 MG capsule Commonly known as: LYRICA  Take 50 mg by mouth 2 (two) times daily.   Trelegy Ellipta  200-62.5-25 MCG/ACT Aepb Generic drug: Fluticasone -Umeclidin-Vilant Inhale 1 puff into the lungs daily.   Voltaren  Arthritis Pain 1 % Gel Generic drug: diclofenac  Sodium Apply 1 Application topically in the morning and at bedtime.        Discharge Exam: Filed Weights   02/14/24 0900 02/15/24 0500  Weight: 70.3 kg 70.1 kg   Vitals:   02/15/24 0839 02/15/24 1619  BP: (!) 154/65 112/61  Pulse: 64 60  Resp: 18 17  Temp:  98.2 F (36.8 C)  SpO2: 99% 96%   Examination: Physical Exam:  Constitutional: WN/WD overweight elderly Caucasian female in no acute distress Respiratory: Diminished to auscultation bilaterally, no wheezing, rales, rhonchi or crackles. Normal respiratory effort and patient is not tachypenic. No accessory muscle use.  Unlabored breathing Cardiovascular: RRR, no murmurs / rubs / gallops. S1 and S2 auscultated.  Minimal extremity edema Abdomen: Soft, non-tender, distended secondary to body habitus. Bowel sounds positive.  GU: Deferred. Musculoskeletal: No clubbing / cyanosis of digits/nails. No joint deformity upper and lower extremities.  No pain on palpation of her feet Skin: No rashes, lesions, ulcers on limited skin evaluation. No induration; Warm and dry.  Neurologic: CN 2-12 grossly intact with no focal deficits. Romberg sign and cerebellar reflexes not assessed.  Psychiatric: Normal judgment and insight. Alert and oriented x 3. Normal mood and appropriate affect.    Condition at discharge: stable  The results of significant diagnostics from this hospitalization (including imaging, microbiology, ancillary and laboratory) are listed below for reference.   Imaging Studies: CT HEAD WO CONTRAST ( ) Result Date: 02/14/2024 CLINICAL DATA:  Mental status change, unknown cause EXAM: CT HEAD WITHOUT CONTRAST TECHNIQUE: Contiguous axial images were obtained from the base of the skull through the vertex without intravenous contrast. RADIATION DOSE REDUCTION: This  exam was performed according to the departmental dose-optimization program which includes automated exposure control, adjustment of the mA and/or kV according to patient size and/or use of iterative reconstruction technique. COMPARISON:  September 05, 2023. FINDINGS: Brain: No evidence of acute infarction, hemorrhage, hydrocephalus, extra-axial collection or mass lesion/mass effect. Vascular: No hyperdense vessel or unexpected calcification. Skull: No acute fracture. Sinuses/Orbits: Clear sinuses.  No acute orbital findings. Other: No mastoid effusions. IMPRESSION: No evidence of acute intracranial abnormality. Electronically Signed   By: Gilmore GORMAN Molt M.D.   On: 02/14/2024 19:38   DG Chest Port 1 View Result Date: 02/14/2024 CLINICAL DATA:  Altered mental status and possible sepsis EXAM: PORTABLE CHEST 1 VIEW COMPARISON:  09/30/2023 FINDINGS: Cardiac shadow is enlarged. Pacing device is again seen. Previously seen nodular densities in the right base are no longer identified. No focal infiltrate or effusion is seen. Mild interstitial thickening is noted. No bony abnormality is seen. IMPRESSION: Previously seen nodular densities are no longer identified in the right base. Mild interstitial thickening is noted which may be related to edema. Electronically Signed   By: Oneil Devonshire M.D.   On: 02/14/2024 03:49    Microbiology: Results for orders placed or performed during the hospital encounter of 02/14/24   Blood Culture (routine x 2)     Status: None (Preliminary result)   Collection Time: 02/14/24  3:37 AM   Specimen: BLOOD  Result Value Ref Range Status   Specimen Description BLOOD LEFT UPPER ARM  Final   Special Requests   Final    BOTTLES DRAWN AEROBIC ONLY Blood Culture adequate volume   Culture   Final    NO GROWTH 1 DAY Performed at [facility], [address] [place], [place] [address]    Report Status PENDING  Incomplete  Resp panel by RT-PCR (RSV, Flu A&B, Covid) Anterior Nasal Swab     Status: None   Collection Time: 02/14/24  3:41 AM   Specimen: Anterior Nasal Swab  Result Value Ref Range Status   SARS Coronavirus 2 by RT PCR NEGATIVE NEGATIVE Final   Influenza A by PCR NEGATIVE NEGATIVE Final   Influenza B by PCR NEGATIVE NEGATIVE Final    Comment: (NOTE) The Xpert Xpress SARS-CoV-2/FLU/RSV plus assay is intended as an aid in the diagnosis of influenza from Nasopharyngeal swab specimens and should not be used as a sole basis for treatment. Nasal washings and aspirates are unacceptable for Xpert Xpress SARS-CoV-2/FLU/RSV testing.  Fact Sheet for Patients: BloggerCourse.com  Fact Sheet for Healthcare Providers: SeriousBroker.it  This test is not yet approved or cleared by the [place]  FDA and has been authorized for detection and/or diagnosis of SARS-CoV-2 by FDA under an Emergency Use Authorization (EUA). This EUA will remain in effect (meaning this test can be used) for the duration of the COVID-19 declaration under Section 564(b)(1) of the Act, 21 U.S.C. section 360bbb-3(b)(1), unless the authorization is terminated or revoked.     Resp Syncytial Virus by PCR NEGATIVE NEGATIVE Final    Comment: (NOTE) Fact Sheet for Patients: BloggerCourse.com  Fact Sheet for Healthcare Providers: SeriousBroker.it  This test is not yet approved or cleared by the  [place]  FDA and has been authorized for detection and/or diagnosis of SARS-CoV-2 by FDA under an Emergency Use Authorization (EUA). This EUA will remain in effect (meaning this test can be used) for the duration of the COVID-19 declaration under Section 564(b)(1) of the Act, 21 U.S.C. section 360bbb-3(b)(1), unless the authorization is terminated  or revoked.  Performed at [facility], [address] [place], [place] [address]   Respiratory (~20 pathogens) panel by PCR     Status: None   Collection Time: 02/14/24  3:41 AM   Specimen: Nasopharyngeal Swab; Respiratory  Result Value Ref Range Status   Adenovirus NOT DETECTED NOT DETECTED Final   Coronavirus 229E NOT DETECTED NOT DETECTED Final    Comment: (NOTE) The Coronavirus on the Respiratory Panel, DOES NOT test for the novel  Coronavirus (2019 nCoV)    Coronavirus HKU1 NOT DETECTED NOT DETECTED Final   Coronavirus NL63 NOT DETECTED NOT DETECTED Final   Coronavirus OC43 NOT DETECTED NOT DETECTED Final   Metapneumovirus NOT DETECTED NOT DETECTED Final   Rhinovirus / Enterovirus NOT DETECTED NOT DETECTED Final   Influenza A NOT DETECTED NOT DETECTED Final   Influenza B NOT DETECTED NOT DETECTED Final   Parainfluenza Virus 1 NOT DETECTED NOT DETECTED Final   Parainfluenza Virus 2 NOT DETECTED NOT DETECTED Final   Parainfluenza Virus 3 NOT DETECTED NOT DETECTED Final   Parainfluenza Virus 4 NOT DETECTED NOT DETECTED Final   Respiratory Syncytial Virus NOT DETECTED NOT DETECTED Final   Bordetella pertussis NOT DETECTED NOT DETECTED Final   Bordetella Parapertussis NOT DETECTED NOT DETECTED Final   Chlamydophila pneumoniae NOT DETECTED NOT DETECTED Final   Mycoplasma pneumoniae NOT DETECTED NOT DETECTED Final    Comment: Performed at [facility], [address], [place], [place] [address]  Blood Culture (routine x 2)     Status: None (Preliminary result)   Collection Time: 02/14/24  4:00 AM   Specimen: BLOOD   Result Value Ref Range Status   Specimen Description BLOOD SITE NOT SPECIFIED  Final   Special Requests   Final    BOTTLES DRAWN AEROBIC AND ANAEROBIC Blood Culture results may not be optimal due to an inadequate volume of blood received in culture bottles   Culture   Final    NO GROWTH 1 DAY Performed at [facility], [address] [place], [place] [address]    Report Status PENDING  Incomplete  Urine Culture (for pregnant, neutropenic or urologic patients or patients with an indwelling urinary catheter)     Status: Abnormal   Collection Time: 02/14/24  8:33 AM   Specimen: Urine, Clean Catch  Result Value Ref Range Status   Specimen Description URINE, CLEAN CATCH  Final   Special Requests   Final    NONE Performed at [facility], [address], [place], [place] [address]    Culture MULTIPLE SPECIES PRESENT, SUGGEST RECOLLECTION (A)  Final   Report Status 02/15/2024 FINAL  Final  C Difficile Quick Screen w PCR reflex     Status: None   Collection Time: 02/14/24  4:34 PM   Specimen: STOOL  Result Value Ref Range Status   C Diff antigen NEGATIVE NEGATIVE Final   C Diff toxin NEGATIVE NEGATIVE Final   C Diff interpretation No C. difficile detected.  Final    Comment: Performed at [facility], [address] [place], [place] [address]   Labs: CBC: Recent Labs  Lab 02/14/24 [phone] 02/15/24 0620  WBC 13.0* 4.2  NEUTROABS 11.4*  --   HGB 12.8 11.8*  HCT 41.0 36.9  MCV 93.2 92.5  PLT 181 148*   Basic Metabolic Panel: Recent Labs  Lab 02/14/24 0337 02/15/24 0620  NA 138 136  K 4.3 4.4  CL 100 104  CO2 26 24  GLUCOSE 124* 96  BUN 23 19  CREATININE 1.31* 0.86  CALCIUM 9.0 8.5*   Liver Function Tests: Recent Labs  Lab 02/14/24 0337  AST 37  ALT 15  ALKPHOS 37*  BILITOT 0.9  PROT 7.0  ALBUMIN 3.7   CBG: No results for input(s): GLUCAP in the last 168 hours.  Discharge time spent: greater than 30 minutes.  Signed: Alejandro Marker,  DO [facility] 02/15/2024

## 2024-02-19 LAB — CULTURE, BLOOD (ROUTINE X 2)
Culture: NO GROWTH
Culture: NO GROWTH
Special Requests: ADEQUATE

## 2024-02-28 ENCOUNTER — Emergency Department (HOSPITAL_COMMUNITY)

## 2024-02-28 ENCOUNTER — Other Ambulatory Visit: Payer: Self-pay

## 2024-02-28 ENCOUNTER — Other Ambulatory Visit (HOSPITAL_COMMUNITY): Payer: Self-pay

## 2024-02-28 ENCOUNTER — Encounter (HOSPITAL_COMMUNITY): Payer: Self-pay

## 2024-02-28 ENCOUNTER — Inpatient Hospital Stay (HOSPITAL_COMMUNITY)
Admission: EM | Admit: 2024-02-28 | Discharge: 2024-03-05 | DRG: 871 | Disposition: A | Discharge status: Home Health | Attending: Internal Medicine | Admitting: Internal Medicine

## 2024-02-28 DIAGNOSIS — G8929 Other chronic pain: Secondary | ICD-10-CM | POA: Diagnosis present

## 2024-02-28 DIAGNOSIS — M4800 Spinal stenosis, site unspecified: Secondary | ICD-10-CM | POA: Diagnosis present

## 2024-02-28 DIAGNOSIS — Z515 Encounter for palliative care: Secondary | ICD-10-CM | POA: Diagnosis not present

## 2024-02-28 DIAGNOSIS — Z1152 Encounter for screening for COVID-19: Secondary | ICD-10-CM

## 2024-02-28 DIAGNOSIS — Z8616 Personal history of COVID-19: Secondary | ICD-10-CM

## 2024-02-28 DIAGNOSIS — J439 Emphysema, unspecified: Secondary | ICD-10-CM | POA: Diagnosis present

## 2024-02-28 DIAGNOSIS — J440 Chronic obstructive pulmonary disease with acute lower respiratory infection: Secondary | ICD-10-CM | POA: Diagnosis present

## 2024-02-28 DIAGNOSIS — J690 Pneumonitis due to inhalation of food and vomit: Secondary | ICD-10-CM | POA: Diagnosis present

## 2024-02-28 DIAGNOSIS — N179 Acute kidney failure, unspecified: Secondary | ICD-10-CM | POA: Diagnosis present

## 2024-02-28 DIAGNOSIS — I2510 Atherosclerotic heart disease of native coronary artery without angina pectoris: Secondary | ICD-10-CM | POA: Diagnosis present

## 2024-02-28 DIAGNOSIS — Z9981 Dependence on supplemental oxygen: Secondary | ICD-10-CM

## 2024-02-28 DIAGNOSIS — K5090 Crohn's disease, unspecified, without complications: Secondary | ICD-10-CM | POA: Diagnosis present

## 2024-02-28 DIAGNOSIS — I480 Paroxysmal atrial fibrillation: Secondary | ICD-10-CM | POA: Diagnosis present

## 2024-02-28 DIAGNOSIS — N1831 Chronic kidney disease, stage 3a: Secondary | ICD-10-CM | POA: Diagnosis present

## 2024-02-28 DIAGNOSIS — J9622 Acute and chronic respiratory failure with hypercapnia: Secondary | ICD-10-CM | POA: Diagnosis present

## 2024-02-28 DIAGNOSIS — R7989 Other specified abnormal findings of blood chemistry: Secondary | ICD-10-CM | POA: Diagnosis present

## 2024-02-28 DIAGNOSIS — Z5986 Financial insecurity: Secondary | ICD-10-CM

## 2024-02-28 DIAGNOSIS — Z87891 Personal history of nicotine dependence: Secondary | ICD-10-CM

## 2024-02-28 DIAGNOSIS — Z888 Allergy status to other drugs, medicaments and biological substances status: Secondary | ICD-10-CM

## 2024-02-28 DIAGNOSIS — M545 Low back pain, unspecified: Secondary | ICD-10-CM | POA: Diagnosis present

## 2024-02-28 DIAGNOSIS — G629 Polyneuropathy, unspecified: Secondary | ICD-10-CM | POA: Diagnosis present

## 2024-02-28 DIAGNOSIS — Z90710 Acquired absence of both cervix and uterus: Secondary | ICD-10-CM

## 2024-02-28 DIAGNOSIS — M1990 Unspecified osteoarthritis, unspecified site: Secondary | ICD-10-CM | POA: Diagnosis present

## 2024-02-28 DIAGNOSIS — I5033 Acute on chronic diastolic (congestive) heart failure: Secondary | ICD-10-CM | POA: Diagnosis present

## 2024-02-28 DIAGNOSIS — I130 Hypertensive heart and chronic kidney disease with heart failure and stage 1 through stage 4 chronic kidney disease, or unspecified chronic kidney disease: Secondary | ICD-10-CM | POA: Diagnosis present

## 2024-02-28 DIAGNOSIS — R0602 Shortness of breath: Principal | ICD-10-CM

## 2024-02-28 DIAGNOSIS — G9341 Metabolic encephalopathy: Secondary | ICD-10-CM | POA: Diagnosis present

## 2024-02-28 DIAGNOSIS — Z950 Presence of cardiac pacemaker: Secondary | ICD-10-CM | POA: Diagnosis present

## 2024-02-28 DIAGNOSIS — J189 Pneumonia, unspecified organism: Secondary | ICD-10-CM | POA: Diagnosis present

## 2024-02-28 DIAGNOSIS — F419 Anxiety disorder, unspecified: Secondary | ICD-10-CM | POA: Diagnosis present

## 2024-02-28 DIAGNOSIS — J9621 Acute and chronic respiratory failure with hypoxia: Secondary | ICD-10-CM | POA: Diagnosis present

## 2024-02-28 DIAGNOSIS — I495 Sick sinus syndrome: Secondary | ICD-10-CM | POA: Diagnosis present

## 2024-02-28 DIAGNOSIS — Z7189 Other specified counseling: Secondary | ICD-10-CM | POA: Diagnosis not present

## 2024-02-28 DIAGNOSIS — I5030 Unspecified diastolic (congestive) heart failure: Secondary | ICD-10-CM | POA: Diagnosis present

## 2024-02-28 DIAGNOSIS — R6520 Severe sepsis without septic shock: Secondary | ICD-10-CM | POA: Diagnosis present

## 2024-02-28 DIAGNOSIS — J449 Chronic obstructive pulmonary disease, unspecified: Secondary | ICD-10-CM | POA: Diagnosis present

## 2024-02-28 DIAGNOSIS — R197 Diarrhea, unspecified: Secondary | ICD-10-CM | POA: Diagnosis not present

## 2024-02-28 DIAGNOSIS — A419 Sepsis, unspecified organism: Principal | ICD-10-CM | POA: Diagnosis present

## 2024-02-28 DIAGNOSIS — Z7951 Long term (current) use of inhaled steroids: Secondary | ICD-10-CM

## 2024-02-28 DIAGNOSIS — Z91013 Allergy to seafood: Secondary | ICD-10-CM

## 2024-02-28 DIAGNOSIS — E785 Hyperlipidemia, unspecified: Secondary | ICD-10-CM | POA: Diagnosis present

## 2024-02-28 DIAGNOSIS — Z883 Allergy status to other anti-infective agents status: Secondary | ICD-10-CM

## 2024-02-28 DIAGNOSIS — Z79899 Other long term (current) drug therapy: Secondary | ICD-10-CM

## 2024-02-28 DIAGNOSIS — Z885 Allergy status to narcotic agent status: Secondary | ICD-10-CM

## 2024-02-28 DIAGNOSIS — Z880 Allergy status to penicillin: Secondary | ICD-10-CM

## 2024-02-28 DIAGNOSIS — I5031 Acute diastolic (congestive) heart failure: Secondary | ICD-10-CM | POA: Diagnosis not present

## 2024-02-28 DIAGNOSIS — Z886 Allergy status to analgesic agent status: Secondary | ICD-10-CM

## 2024-02-28 DIAGNOSIS — K219 Gastro-esophageal reflux disease without esophagitis: Secondary | ICD-10-CM | POA: Diagnosis present

## 2024-02-28 DIAGNOSIS — Z7901 Long term (current) use of anticoagulants: Secondary | ICD-10-CM

## 2024-02-28 DIAGNOSIS — Z8701 Personal history of pneumonia (recurrent): Secondary | ICD-10-CM

## 2024-02-28 LAB — URINALYSIS, W/ REFLEX TO CULTURE (INFECTION SUSPECTED)
Bilirubin Urine: NEGATIVE
Glucose, UA: NEGATIVE mg/dL
Ketones, ur: NEGATIVE mg/dL
Nitrite: NEGATIVE
Protein, ur: NEGATIVE mg/dL
Specific Gravity, Urine: 1.015 (ref 1.005–1.030)
pH: 5 (ref 5.0–8.0)

## 2024-02-28 LAB — COMPREHENSIVE METABOLIC PANEL WITH GFR
ALT: 12 U/L (ref 0–44)
AST: 35 U/L (ref 15–41)
Albumin: 4 g/dL (ref 3.5–5.0)
Alkaline Phosphatase: 40 U/L (ref 38–126)
Anion gap: 14 (ref 5–15)
BUN: 27 mg/dL — ABNORMAL HIGH (ref 8–23)
CO2: 28 mmol/L (ref 22–32)
Calcium: 9.5 mg/dL (ref 8.9–10.3)
Chloride: 100 mmol/L (ref 98–111)
Creatinine, Ser: 1.36 mg/dL — ABNORMAL HIGH (ref 0.44–1.00)
GFR, Estimated: 39 mL/min — ABNORMAL LOW (ref >60)
Glucose, Bld: 129 mg/dL — ABNORMAL HIGH (ref 70–99)
Potassium: 4 mmol/L (ref 3.5–5.1)
Sodium: 142 mmol/L (ref 135–145)
Total Bilirubin: 0.8 mg/dL (ref 0.0–1.2)
Total Protein: 7.7 g/dL (ref 6.5–8.1)

## 2024-02-28 LAB — CBC WITH DIFFERENTIAL/PLATELET
Abs Immature Granulocytes: 0.06 K/uL (ref 0.00–0.07)
Basophils Absolute: 0.1 K/uL (ref 0.0–0.1)
Basophils Relative: 1 %
Eosinophils Absolute: 0.2 K/uL (ref 0.0–0.5)
Eosinophils Relative: 1 %
HCT: 41.6 % (ref 36.0–46.0)
Hemoglobin: 13.4 g/dL (ref 12.0–15.0)
Immature Granulocytes: 1 %
Lymphocytes Relative: 6 %
Lymphs Abs: 0.7 K/uL (ref 0.7–4.0)
MCH: 29.4 pg (ref 26.0–34.0)
MCHC: 32.2 g/dL (ref 30.0–36.0)
MCV: 91.2 fL (ref 80.0–100.0)
Monocytes Absolute: 0.6 K/uL (ref 0.1–1.0)
Monocytes Relative: 5 %
Neutro Abs: 10.8 K/uL — ABNORMAL HIGH (ref 1.7–7.7)
Neutrophils Relative %: 86 %
Platelets: 224 K/uL (ref 150–400)
RBC: 4.56 MIL/uL (ref 3.87–5.11)
RDW: 14.2 % (ref 11.5–15.5)
WBC: 12.4 K/uL — ABNORMAL HIGH (ref 4.0–10.5)
nRBC: 0 % (ref 0.0–0.2)

## 2024-02-28 LAB — RESP PANEL BY RT-PCR (RSV, FLU A&B, COVID)  RVPGX2
Influenza A by PCR: NEGATIVE
Influenza B by PCR: NEGATIVE
Resp Syncytial Virus by PCR: NEGATIVE
SARS Coronavirus 2 by RT PCR: NEGATIVE

## 2024-02-28 LAB — I-STAT CG4 LACTIC ACID, ED
Lactic Acid, Venous: 1.9 mmol/L (ref 0.5–1.9)
Lactic Acid, Venous: 1.9 mmol/L (ref 0.5–1.9)

## 2024-02-28 LAB — BRAIN NATRIURETIC PEPTIDE: B Natriuretic Peptide: 428.4 pg/mL — ABNORMAL HIGH (ref 0.0–100.0)

## 2024-02-28 LAB — PHOSPHORUS: Phosphorus: 3.3 mg/dL (ref 2.5–4.6)

## 2024-02-28 LAB — MAGNESIUM: Magnesium: 1.5 mg/dL — ABNORMAL LOW (ref 1.7–2.4)

## 2024-02-28 LAB — PROTIME-INR
INR: 1.2 (ref 0.8–1.2)
Prothrombin Time: 16.3 s — ABNORMAL HIGH (ref 11.4–15.2)

## 2024-02-28 LAB — TROPONIN I (HIGH SENSITIVITY)
Troponin I (High Sensitivity): 188 ng/L (ref <18)
Troponin I (High Sensitivity): 175 ng/L (ref <18)

## 2024-02-28 LAB — LIPASE, BLOOD: Lipase: 39 U/L (ref 11–51)

## 2024-02-28 LAB — PROCALCITONIN: Procalcitonin: <0.1 ng/mL

## 2024-02-28 MED ORDER — SODIUM CHLORIDE 0.9 % IV SOLN
100.0000 mg | Freq: Two times a day (BID) | INTRAVENOUS | Status: DC
  Administered 2024-02-28 (×2): 100 mg via INTRAVENOUS
  Filled 2024-02-28 (×3): qty 100

## 2024-02-28 MED ORDER — APIXABAN 5 MG PO TABS
5.0000 mg | ORAL_TABLET | Freq: Two times a day (BID) | ORAL | Status: DC
  Administered 2024-02-28 – 2024-03-05 (×19): 5 mg via ORAL
  Filled 2024-02-28 (×13): qty 1

## 2024-02-28 MED ORDER — ONDANSETRON HCL 4 MG/2ML IJ SOLN
4.0000 mg | Freq: Four times a day (QID) | INTRAMUSCULAR | Status: DC | PRN
  Administered 2024-02-28 – 2024-03-01 (×4): 4 mg via INTRAVENOUS
  Filled 2024-02-28 (×2): qty 2

## 2024-02-28 MED ORDER — PREGABALIN 25 MG PO CAPS
50.0000 mg | ORAL_CAPSULE | Freq: Two times a day (BID) | ORAL | Status: DC
  Administered 2024-02-28 – 2024-03-05 (×19): 50 mg via ORAL
  Filled 2024-02-28 (×13): qty 2

## 2024-02-28 MED ORDER — FENOFIBRATE 160 MG PO TABS
160.0000 mg | ORAL_TABLET | Freq: Every day | ORAL | Status: DC
  Administered 2024-02-28 – 2024-03-05 (×10): 160 mg via ORAL
  Filled 2024-02-28 (×7): qty 1

## 2024-02-28 MED ORDER — PANTOPRAZOLE SODIUM 40 MG PO TBEC
40.0000 mg | DELAYED_RELEASE_TABLET | Freq: Every day | ORAL | Status: DC
  Administered 2024-02-28 – 2024-03-05 (×10): 40 mg via ORAL
  Filled 2024-02-28 (×7): qty 1

## 2024-02-28 MED ORDER — CYCLOBENZAPRINE HCL 5 MG PO TABS
5.0000 mg | ORAL_TABLET | Freq: Every day | ORAL | Status: DC | PRN
  Administered 2024-02-28 – 2024-03-04 (×4): 5 mg via ORAL
  Filled 2024-02-28 (×3): qty 1

## 2024-02-28 MED ORDER — SODIUM CHLORIDE 0.9 % IV SOLN
500.0000 mg | Freq: Every day | INTRAVENOUS | Status: DC
  Administered 2024-02-28 (×2): 500 mg via INTRAVENOUS
  Filled 2024-02-28: qty 5

## 2024-02-28 MED ORDER — GUAIFENESIN ER 600 MG PO TB12
600.0000 mg | ORAL_TABLET | Freq: Two times a day (BID) | ORAL | Status: DC
  Administered 2024-02-28 – 2024-03-05 (×19): 600 mg via ORAL
  Filled 2024-02-28 (×13): qty 1

## 2024-02-28 MED ORDER — IOHEXOL 350 MG/ML SOLN
75.0000 mL | Freq: Once | INTRAVENOUS | Status: AC | PRN
Start: 2024-02-28 — End: 2024-02-28
  Administered 2024-02-28 (×2): 75 mL via INTRAVENOUS

## 2024-02-28 MED ORDER — DIPHENHYDRAMINE HCL 25 MG PO CAPS
25.0000 mg | ORAL_CAPSULE | Freq: Once | ORAL | Status: AC
  Administered 2024-02-28 (×2): 25 mg via ORAL
  Filled 2024-02-28: qty 1

## 2024-02-28 MED ORDER — TRIMETHOBENZAMIDE HCL 100 MG/ML IM SOLN: 200.0000 mg | Freq: Four times a day (QID) | INTRAMUSCULAR | Status: DC | PRN

## 2024-02-28 MED ORDER — ACETAMINOPHEN 650 MG RE SUPP: 650.0000 mg | Freq: Four times a day (QID) | RECTAL | Status: DC | PRN

## 2024-02-28 MED ORDER — ALPRAZOLAM 0.5 MG PO TABS
1.0000 mg | ORAL_TABLET | Freq: Two times a day (BID) | ORAL | Status: DC
  Administered 2024-02-28 – 2024-03-05 (×16): 1 mg via ORAL
  Filled 2024-02-28 (×6): qty 2
  Filled 2024-02-28: qty 4
  Filled 2024-02-28 (×5): qty 2

## 2024-02-28 MED ORDER — LEVALBUTEROL HCL 0.63 MG/3ML IN NEBU
0.6300 mg | INHALATION_SOLUTION | RESPIRATORY_TRACT | Status: DC | PRN
  Administered 2024-03-02: 0.63 mg via RESPIRATORY_TRACT
  Filled 2024-02-28: qty 3

## 2024-02-28 MED ORDER — MELATONIN 3 MG PO TABS
3.0000 mg | ORAL_TABLET | Freq: Every evening | ORAL | Status: DC | PRN
  Administered 2024-02-28 (×2): 3 mg via ORAL
  Filled 2024-02-28 (×2): qty 1

## 2024-02-28 MED ORDER — BUDESON-GLYCOPYRROL-FORMOTEROL 160-9-4.8 MCG/ACT IN AERO
2.0000 | INHALATION_SPRAY | Freq: Two times a day (BID) | RESPIRATORY_TRACT | Status: DC
  Filled 2024-02-28: qty 5.9

## 2024-02-28 MED ORDER — ACETAMINOPHEN 325 MG PO TABS: 650.0000 mg | ORAL_TABLET | Freq: Four times a day (QID) | ORAL | Status: DC | PRN

## 2024-02-28 MED ORDER — SODIUM CHLORIDE 0.9 % IV SOLN: INTRAVENOUS | Status: AC

## 2024-02-28 MED ORDER — ATENOLOL 50 MG PO TABS
25.0000 mg | ORAL_TABLET | Freq: Every day | ORAL | Status: DC
Start: 2024-02-28 — End: 2024-03-05
  Administered 2024-02-29 – 2024-03-05 (×8): 25 mg via ORAL
  Filled 2024-02-28 (×7): qty 1

## 2024-02-28 MED ORDER — FENTANYL CITRATE PF 50 MCG/ML IJ SOSY
50.0000 ug | PREFILLED_SYRINGE | Freq: Once | INTRAMUSCULAR | Status: AC
  Administered 2024-02-28 (×2): 50 ug via INTRAVENOUS
  Filled 2024-02-28: qty 1

## 2024-02-28 MED ORDER — BUMETANIDE 1 MG PO TABS: 1.0000 mg | ORAL_TABLET | Freq: Every day | ORAL | Status: DC | PRN

## 2024-02-28 MED ORDER — BUDESON-GLYCOPYRROL-FORMOTEROL 160-9-4.8 MCG/ACT IN AERO
2.0000 | INHALATION_SPRAY | Freq: Two times a day (BID) | RESPIRATORY_TRACT | Status: DC
  Administered 2024-02-28 – 2024-03-02 (×12): 2 via RESPIRATORY_TRACT
  Filled 2024-02-28: qty 5.9

## 2024-02-28 MED ORDER — FLUCONAZOLE 150 MG PO TABS
150.0000 mg | ORAL_TABLET | Freq: Once | ORAL | Status: AC
  Administered 2024-02-28 (×2): 150 mg via ORAL
  Filled 2024-02-28: qty 1

## 2024-02-28 MED ORDER — EZETIMIBE 10 MG PO TABS
10.0000 mg | ORAL_TABLET | Freq: Every day | ORAL | Status: DC
  Administered 2024-02-28 – 2024-03-04 (×9): 10 mg via ORAL
  Filled 2024-02-28 (×6): qty 1

## 2024-02-28 MED ORDER — SODIUM CHLORIDE 0.9% FLUSH
3.0000 mL | Freq: Two times a day (BID) | INTRAVENOUS | Status: DC
  Administered 2024-02-28 – 2024-03-05 (×17): 3 mL via INTRAVENOUS

## 2024-02-28 MED ORDER — ACETAMINOPHEN 500 MG PO TABS
1000.0000 mg | ORAL_TABLET | Freq: Once | ORAL | Status: AC
  Administered 2024-02-28 (×2): 1000 mg via ORAL
  Filled 2024-02-28: qty 2

## 2024-02-28 MED ORDER — SODIUM CHLORIDE 0.9 % IV SOLN
2.0000 g | Freq: Once | INTRAVENOUS | Status: AC
  Administered 2024-02-28 (×2): 2 g via INTRAVENOUS
  Filled 2024-02-28: qty 20

## 2024-02-28 MED ORDER — OXYCODONE-ACETAMINOPHEN 5-325 MG PO TABS
1.0000 | ORAL_TABLET | Freq: Four times a day (QID) | ORAL | Status: DC | PRN
  Administered 2024-02-28 – 2024-03-05 (×22): 1 via ORAL
  Filled 2024-02-28 (×15): qty 1

## 2024-02-28 NOTE — H&P (Signed)
 History and Physical    Patient: Brittany Irwin FMW:[id number] DOB: 01/13/1941 DOA: 02/28/2024 DOS: the patient was seen and examined on 02/28/2024 PCP: [name], MD  Patient coming from: Home  Chief Complaint:  Chief Complaint  Patient presents with   Back Pain   Shortness of Breath   HPI: Brittany Irwin is a 83 y.o. female with medical history significant of COPD, chronic respiratory failure on 4 L, HfpEF, CAD, tachybradycardia syndrome s/p permanent pacemaker, Crohn's disease, SBO, psoriasis, migraine headaches, CKD stage III, and anxiety presents with complaints of back pain and shortness of breath. She is accompanied by her sister.  Records note she had just recently been hospitalized from 7/28 -7/29 for SIRS with question of a urinary tract infection versus possible upper respiratory infection, but thought likely secondary to the patient being acutely dehydrated.  Experiences a persistent cough, sometimes productive, along with episodes of shaking chills and feeling extremely cold, similar to previous pneumonia episodes.  She recently started on Eliquis  for atrial fibrillation, identified during a pacemaker check. She takes two tablets per day, one in the morning and one in the evening. Since starting the medication, she has experienced nausea and vomiting, which she associates with the medication.  She has a history of spinal stenosis, degenerative arthritis, and neuropathy, causing significant back pain that limits her physical activity. She often needs to lie on a heating pad for relief. She has had steroid injections for pain relief but is hesitant to pursue this treatment due to adverse reactions, including increased heart rate and blood pressure.  She has a history of Crohn's disease and experiences discomfort from adhesions related to past  Upon admission into the emergency department patient was noted to be febrile up to 101.2 F with O2 saturations currently  maintained on 6 L nasal cannula oxygen.  Labs significant for WBC 12.4, BUN 27, creatinine 1.36, lactic acid 1.9, BNP 428.4, and high-sensitivity troponin 188->175.  Chest x-ray noted no acute abnormality.  Influenza, COVID-19, and RSV screening were negative.  Urinalysis noted small hemoglobin, small leukocytes, rare bacteria, and 6-10 WBCs.  CT scan of the head, chest, abdomen, and pelvis have been obtained which revealed concern for right sided pneumonia and no signs of a pulmonary embolism.  Blood cultures have been obtained.  Patient had been given empiric antibiotics of Rocephin  and azithromycin .  Review of Systems: As mentioned in the history of present illness. All other systems reviewed and are negative. Past Medical History:  Diagnosis Date   Acute CHF (congestive heart failure) (HCC) 01/02/2022   AKI (acute kidney injury) (HCC) 09/08/2020   Aspiration pneumonia (HCC) 01/05/2019   Atypical chest pain 03/11/2022   Bradycardia 03/14/2020   C. difficile colitis 04/03/2021   CHF (congestive heart failure) (HCC)    COPD (chronic obstructive pulmonary disease) (HCC)    Coronary artery disease    Tachycardia 05/31/2013   Past Surgical History:  Procedure Laterality Date   ABDOMINAL HYSTERECTOMY     BOWEL RESECTION     CHOLECYSTECTOMY     Social History:  reports that she has quit smoking. Her smoking use included cigarettes. She does not have any smokeless tobacco history on file. She reports that she does not currently use alcohol . She reports that she does not use drugs.  Allergies  Allergen Reactions   Methylprednisolone  Other (See Comments)    Increased BP, HR, agitation, hallucinations    Shellfish Allergy Nausea And Vomiting    All SEAFOOD   Ativan  [  Lorazepam ] Other (See Comments)    Psychosis   Azulfidine [Sulfasalazine] Other (See Comments)    Headache    Epipen [Epinephrine] Hypertension   Flagyl [Metronidazole] Hives   Motrin [Ibuprofen] Nausea Only and Other (See  Comments)    Told not to take medication due to GI distress caused by crohn's disease.   Nsaids Other (See Comments)    Told not to take NSAIDs due to GI distress caused by crohn's disease   Penicillins Hives    12/2021, 11/2023 tolerated cephalosporins    Morphine And Codeine  Itching    Told not to take due to GI distress caused by crohn's disease    History reviewed. No pertinent family history.  Prior to Admission medications   Medication Sig Start Date End Date Taking? Authorizing Provider  acetaminophen -codeine  (TYLENOL  #4) 300-60 MG tablet Take 1 tablet by mouth every 6 (six) hours as needed for pain. 12/31/21   [provider]  ALPRAZolam  (XANAX ) 1 MG tablet Take 1 mg by mouth 2 (two) times daily. 01/02/22   [provider]  atenolol  (TENORMIN ) 25 MG tablet Take 1 tablet (25 mg total) by mouth daily. 02/16/24   Sherrill Cable Latif, DO  bumetanide  (BUMEX ) 1 MG tablet Take 1 tablet (1 mg total) by mouth daily as needed. 02/15/24   Sheikh, Omair Latif, DO  cyanocobalamin (,VITAMIN B-12,) 1000 MCG/ML injection Inject 1,000 mcg into the muscle every 30 (thirty) days.    [provider]  cyclobenzaprine  (FLEXERIL ) 5 MG tablet Take 5 mg by mouth daily as needed for muscle spasms. 10/13/22   [provider]  diclofenac  Sodium (VOLTAREN  ARTHRITIS PAIN) 1 % GEL Apply 1 Application topically in the morning and at bedtime.    [provider]  ergocalciferol (VITAMIN D2) 1.25 MG (50000 UT) capsule Take 50,000 Units by mouth every 14 (fourteen) days. Every other Friday    [provider]  ezetimibe  (ZETIA ) 10 MG tablet Take 10 mg by mouth at bedtime. 12/31/21   [provider]  fenofibrate  micronized (LOFIBRA) 200 MG capsule Take 200 mg by mouth daily. 12/31/21   [provider]  Fluticasone -Umeclidin-Vilant (TRELEGY ELLIPTA ) 200-62.5-25 MCG/ACT AEPB Inhale 1 puff into the lungs daily. 12/22/23     levalbuterol  (XOPENEX ) 0.63 MG/3ML  nebulizer solution Take 3 mLs (0.63 mg total) by nebulization every 4 (four) hours as needed for wheezing or shortness of breath. 09/11/23 02/15/24  Christobal Guadalajara, MD  loratadine  (CLARITIN ) 10 MG tablet Take 1 tablet (10 mg total) by mouth daily for 14 days. Patient taking differently: Take 10 mg by mouth daily as needed for allergies. 09/12/23 02/15/24  Christobal Guadalajara, MD  Menthol, Topical Analgesic, (ICY HOT BACK EX) Apply 1 Application topically 2 (two) times daily as needed (back pain).    [provider]  nitroGLYCERIN (NITROSTAT) 0.4 MG SL tablet Place 0.4 mg under the tongue every 5 (five) minutes x 3 doses as needed for chest pain. 12/31/21   [provider]  omeprazole (PRILOSEC) 40 MG capsule Take 40 mg by mouth daily.    [provider]  ondansetron  (ZOFRAN ) 4 MG tablet Take 1 tablet (4 mg total) by mouth every 6 (six) hours as needed for nausea. 02/15/24   Sherrill Cable Latif, DO  pregabalin  (LYRICA ) 50 MG capsule Take 50 mg by mouth 2 (two) times daily.    [provider]    Physical Exam: Vitals:   02/28/24 0345 02/28/24 0500 02/28/24 0528 02/28/24 0630  BP: (!) 129/45  133/67  (!) 121/57  Pulse: 70 65  62  Resp: 13 16  17   Temp:   98.1 F (36.7 C)   TempSrc:   Oral   SpO2: 95% 92%  92%  Weight:      Height:       Constitutional: Elderly female who appears to be chronically ill but able to follow commands Eyes: PERRL, lids and conjunctivae normal ENMT: Mucous membranes are moist.  Normal dentition.  Neck: normal, supple, no JVD Respiratory: Normal respiratory effort without significant wheezes appreciated on physical exam.  O2 saturations currently maintained on 4 L of nasal cannula oxygen Cardiovascular: Regular rate and rhythm, no murmurs / rubs / gallops. No extremity edema.   Abdomen: no tenderness, no masses palpated. No hepatosplenomegaly. Bowel sounds positive.  Musculoskeletal: no clubbing / cyanosis. No joint deformity upper and lower  extremities. Good ROM, no contractures. Normal muscle tone.  Skin: no rashes, lesions, ulcers. No induration Neurologic: CN 2-12 grossly intact. Sensation intact, DTR normal. Strength 5/5 in all 4.  Psychiatric: Normal judgment and insight. Alert and oriented x 3. Normal mood.   Data Reviewed:  EKG reveals sinus rhythm at 79 bpm with first-degree heart block, right bundle branch block, and QTc noted to be 508  Assessment and Plan:  Acute on chronic respiratory failure with hypoxia Sepsis secondary to pneumonia Patient presented with fever up to 101.2 F with leukocytosis.  Currently requiring 6 L of nasal cannula oxygen maintain O2 saturations greater than 90%.  CTA of the chest noted concerns for a multifocal right-sided pneumonia.  Lactic acid was reassuring at 1.9.  Blood cultures have been ordered and patient had been started on empiric antibiotics of Rocephin  and azithromycin . - Admit to a progressive bed - Continuous pulse oximetry with oxygen maintain O2 saturation greater than 90%  - Aspiration precautions with elevation head of the bed - Incentive spirometry and flutter valve - Follow-up blood cultures - Check procalcitonin and MRSA screen - Continue Rocephin .  Discontinued azithromycin  due to prolonged QT and orders placed for doxycycline  - Mucinex  - Tylenol  as needed for fever  COPD Patient without significant wheezes or rhonchi appreciated on physical exam - Continue home inhaler - Levalbuterol  as needed for shortness of breath/wheezing  Elevated troponin Acute.  Patient does not report having any specific chest pain.  High-sensitivity troponin 188-> 175.  EKG similar to prior.  Question if secondary to demand. - Continue to monitor  Acute kidney injury Creatinine noted to be elevated at 1.36 with BUN 27.  Baseline creatinine previously noted to be 0.8 when last checked.. - Give normal saline IV fluids at 100 mL/h for 1 L - Recheck kidney function in a.m.  Back  pain Acute on chronic.  Patient reports having acute worsening of her back pain.  Denies having any significant injury to onset symptoms.  Home medication regimen includes Tylenol  4. - Oxycodone  as needed for pain  Heart failure with preserved EF Patient appears fairly euvolemic.  BNP elevated at 428.4, but lower than when previously checked last month.  Last echocardiogram noted EF to be 60 to 65% with grade 1 diastolic dysfunction when checked back in 10/15/2022. -Strict I&O's and daily weights - Continue torsemide as needed  Tachybradycardia syndrome S/p PPM Patient had a [employer] pacemaker placed back in 02/2023.  Followed by Dr. Luke of [facility]ology at [facility].  History of Crohn's disease  Patient does not report having any significant episodes of diarrhea or blood in stools.  Anxiety Chronic. - Continue Xanax  as needed  Neuropathy - Continue Lyrica  and gabapentin .  Hyperlipidemia - Continue fenofibrate  and Zetia   GERD  - Continue PPI  DVT prophylaxis: Eliquis  Advance Care Planning:   Code Status: Full Code   Consults: None  Family Communication: None  Severity of Illness: The appropriate patient status for this patient is INPATIENT. Inpatient status is judged to be reasonable and necessary in order to provide the required intensity of service to ensure the patient's safety. The patient's presenting symptoms, physical exam findings, and initial radiographic and laboratory data in the context of their chronic comorbidities is felt to place them at high risk for further clinical deterioration. Furthermore, it is not anticipated that the patient will be medically stable for discharge from the hospital within 2 midnights of admission.   * I certify that at the point of admission it is my clinical judgment that the patient will require inpatient hospital care spanning beyond 2 midnights from the point of admission due to high intensity of service, high risk for further  deterioration and high frequency of surveillance required.*  Author: Maximino DELENA Sharps, MD 02/28/2024 7:29 AM  For on call review [email or web address].

## 2024-02-28 NOTE — ED Notes (Signed)
 Trop. 188 reported to Dr. Theadore

## 2024-02-28 NOTE — ED Notes (Signed)
 Transported to CT

## 2024-02-28 NOTE — ED Notes (Signed)
 CCMD called.

## 2024-02-28 NOTE — ED Triage Notes (Signed)
 Patient BIB [facility] from home due to back pain and SOB. Patient states back pain is chronic, but worse than normal. Patient also complaining of SOB that started two hours ago. Patient states this was accompanied by non-radiating chest pain. No chest pain upon arrival. Wears 3-4L [place] at baseline. Hx of COPD and CHF. 75 mcg fentynal en route.

## 2024-02-28 NOTE — Sepsis Progress Note (Addendum)
 Elink following for sepsis protocol, current ED RN was asked to document abx timing/blood cultures collection

## 2024-02-28 NOTE — ED Provider Notes (Signed)
 [facility]PT [facility] Emergency Department Provider Note MRN:  [id number]  Arrival date & time: 02/28/24     Chief Complaint   Back Pain and Shortness of Breath   History of Present Illness   Brittany Irwin is a 83 y.o. year-old female with a history of CAD, COPD presenting to the ED with chief complaint of back pain and shortness of breath.  Patient presenting with fever.  Complaining of acute on chronic low back pain.  Also feeling short of breath, as well as some central chest pain.  Review of Systems  A thorough review of systems was obtained and all systems are negative except as noted in the HPI and PMH.   Patient's Health History    Past Medical History:  Diagnosis Date   Acute CHF (congestive heart failure) (HCC) 01/02/2022   AKI (acute kidney injury) (HCC) 09/08/2020   Aspiration pneumonia (HCC) 01/05/2019   Atypical chest pain 03/11/2022   Bradycardia 03/14/2020   C. difficile colitis 04/03/2021   CHF (congestive heart failure) (HCC)    COPD (chronic obstructive pulmonary disease) (HCC)    Coronary artery disease    Tachycardia 05/31/2013    Past Surgical History:  Procedure Laterality Date   ABDOMINAL HYSTERECTOMY     BOWEL RESECTION     CHOLECYSTECTOMY      History reviewed. No pertinent family history.  Social History   Socioeconomic History   Marital status: Single    Spouse name: Not on file   Number of children: Not on file   Years of education: Not on file   Highest education level: Not on file  Occupational History   Not on file  Tobacco Use   Smoking status: Former    Types: Cigarettes   Smokeless tobacco: Not on file  Substance and Sexual Activity   Alcohol  use: Not Currently   Drug use: Never   Sexual activity: Not on file  Other Topics Concern   Not on file  Social History Narrative   Not on file   Social Drivers of Health   Financial Resource Strain: Low Risk  (10/14/2023)   Received from [facility]    Overall Financial Resource Strain (CARDIA)    Difficulty of Paying Living Expenses: Not hard at all  Recent Concern: Financial Resource Strain - Medium Risk (07/27/2023)   Received from [facility]   Overall Financial Resource Strain (CARDIA)    Difficulty of Paying Living Expenses: Somewhat hard  Food Insecurity: No Food Insecurity (02/14/2024)   Hunger Vital Sign    Worried About Running Out of Food in the Last Year: Never true    Ran Out of Food in the Last Year: Never true  Transportation Needs: No Transportation Needs (02/14/2024)   PRAPARE - Administrator, Civil Service (Medical): No    Lack of Transportation (Non-Medical): No  Physical Activity: Insufficiently Active (07/27/2023)   Received from [facility]   Exercise Vital Sign    On average, how many days per week do you engage in moderate to strenuous exercise (like a brisk walk)?: 1 day    On average, how many minutes do you engage in exercise at this level?: 20 min  Stress: No Stress Concern Present (07/27/2023)   Received from [facility] - Occupational Stress Questionnaire    Feeling of Stress : Only a little  Social Connections: Unknown (02/14/2024)   Social Connection and Isolation Panel    Frequency  of Communication with Friends and Family: Three times a week    Frequency of Social Gatherings with Friends and Family: Twice a week    Attends Religious Services: Patient declined    Database administrator or Organizations: Patient declined    Attends Banker Meetings: Patient declined    Marital Status: Widowed  Intimate Partner Violence: Not At Risk (02/14/2024)   Humiliation, Afraid, Rape, and Kick questionnaire    Fear of Current or Ex-Partner: No    Emotionally Abused: No    Physically Abused: No    Sexually Abused: No     Physical Exam   Vitals:   02/28/24 0500 02/28/24 0528  BP: 133/67   Pulse: 65   Resp: 16   Temp:  98.1 F (36.7 C)   SpO2: 92%     CONSTITUTIONAL: Chronically ill-appearing, NAD NEURO/PSYCH: Mildly somnolent, keeps eyes closed, easily wakes, can answer questions and follow commands, moves all extremities EYES:  eyes equal and reactive ENT/NECK:  no LAD, no JVD CARDIO: Regular rate, well-perfused, normal S1 and S2 PULM:  CTAB no wheezing or rhonchi GI/GU:  non-distended, non-tender MSK/SPINE:  No gross deformities, no edema SKIN:  no rash, atraumatic   *Additional and/or pertinent findings included in MDM below  Diagnostic and Interventional Summary    EKG Interpretation Date/Time:  Monday February 28 2024 02:19:33 EDT Ventricular Rate:  79 PR Interval:  224 QRS Duration:  144 QT Interval:  443 QTC Calculation: 508 R Axis:   253  Text Interpretation: Sinus rhythm Prolonged PR interval Right bundle branch block Inferior infarct, acute Anteroseptal infarct, age indeterminate Lateral leads are also involved Confirmed by Theadore Sharper [phone]) on 02/28/2024 2:48:35 AM       Labs Reviewed  COMPREHENSIVE METABOLIC PANEL WITH GFR - Abnormal; Notable for the following components:      Result Value   Glucose, Bld 129 (*)    BUN 27 (*)    Creatinine, Ser 1.36 (*)    GFR, Estimated 39 (*)    All other components within normal limits  CBC WITH DIFFERENTIAL/PLATELET - Abnormal; Notable for the following components:   WBC 12.4 (*)    Neutro Abs 10.8 (*)    All other components within normal limits  PROTIME-INR - Abnormal; Notable for the following components:   Prothrombin Time 16.3 (*)    All other components within normal limits  URINALYSIS, W/ REFLEX TO CULTURE (INFECTION SUSPECTED) - Abnormal; Notable for the following components:   APPearance HAZY (*)    Hgb urine dipstick SMALL (*)    Leukocytes,Ua SMALL (*)    Bacteria, UA RARE (*)    All other components within normal limits  BRAIN NATRIURETIC PEPTIDE - Abnormal; Notable for the following components:   B Natriuretic Peptide 428.4 (*)     All other components within normal limits  TROPONIN I (HIGH SENSITIVITY) - Abnormal; Notable for the following components:   Troponin I (High Sensitivity) 188 (*)    All other components within normal limits  TROPONIN I (HIGH SENSITIVITY) - Abnormal; Notable for the following components:   Troponin I (High Sensitivity) 175 (*)    All other components within normal limits  RESP PANEL BY RT-PCR (RSV, FLU A&B, COVID)  RVPGX2  CULTURE, BLOOD (ROUTINE X 2)  CULTURE, BLOOD (ROUTINE X 2)  LIPASE, BLOOD  MAGNESIUM   PHOSPHORUS  PROCALCITONIN  I-STAT CG4 LACTIC ACID, ED  I-STAT CG4 LACTIC ACID, ED    CT HEAD WO CONTRAST ( )  Final Result    CT Angio Chest Pulmonary Embolism (PE) W or WO Contrast  Final Result    CT ABDOMEN PELVIS W CONTRAST  Final Result    DG Chest Port 1 View  Final Result      Medications  acetaminophen  (TYLENOL ) tablet 650 mg (has no administration in time range)    Or  acetaminophen  (TYLENOL ) suppository 650 mg (has no administration in time range)  melatonin tablet 3 mg (has no administration in time range)  ondansetron  (ZOFRAN ) injection 4 mg (has no administration in time range)  azithromycin  (ZITHROMAX ) 500 mg in sodium chloride  0.9 % 250 mL IVPB (has no administration in time range)  acetaminophen  (TYLENOL ) tablet 1,000 mg (1,000 mg Oral Given 02/28/24 0345)  cefTRIAXone  (ROCEPHIN ) 2 g in sodium chloride  0.9 % 100 mL IVPB (0 g Intravenous Stopped 02/28/24 0553)  fentaNYL  (SUBLIMAZE ) injection 50 mcg (50 mcg Intravenous Given 02/28/24 0522)  iohexol  (OMNIPAQUE ) 350 MG/ML injection 75 mL (75 mLs Intravenous Contrast Given 02/28/24 0508)     Procedures  /  Critical Care .Critical Care  Performed by: Theadore Ozell HERO, MD Authorized by: Theadore Ozell HERO, MD   Critical care provider statement:    Critical care time (minutes):  35   Critical care was necessary to treat or prevent imminent or life-threatening deterioration of the following conditions:  Sepsis    Critical care was time spent personally by me on the following activities:  Development of treatment plan with patient or surrogate, discussions with consultants, evaluation of patient's response to treatment, examination of patient, ordering and review of laboratory studies, ordering and review of radiographic studies, ordering and performing treatments and interventions, pulse oximetry, re-evaluation of patient's condition and review of old charts   ED Course and Medical Decision Making  Initial Impression and Ddx Patient seems to be mildly confused in the setting of fever, otherwise normal vital signs.  Multiple comorbidities, fairly recent admission for SIRS criteria.  Differential diagnosis is broad, with the chest pain, shortness of breath  Past medical/surgical history that increases complexity of ED encounter: CHF, COPD  Interpretation of Diagnostics I personally reviewed the EKG and my interpretation is as follows: Bundle branch block  No significant blood count or electrolyte disturbance.  Mild leukocytosis, elevated troponin, mild BNP elevation  Patient Reassessment and Ultimate Disposition/Management     Accepted by hospitalist for admission.  Given the white blood cells and the fever code sepsis was initiated and patient was given empiric antibiotics.  Judicious of fluids in the setting of CHF and normal blood pressure.  Patient management required discussion with the following services or consulting groups:  Hospitalist Service  Complexity of Problems Addressed Acute illness or injury that poses threat of life of bodily function  Additional Data Reviewed and Analyzed Further history obtained from: Further history from spouse/family member  Additional Factors Impacting ED Encounter Risk Consideration of hospitalization  Ozell HERO. Theadore, MD [facility] Emergency Medicine [facility] [email or web address]  Final Clinical Impressions(s) / ED Diagnoses      ICD-10-CM   1. Shortness of breath  R06.02       ED Discharge Orders     None        Discharge Instructions Discussed with and Provided to Patient:   Discharge Instructions   None      Theadore Ozell HERO, MD 02/28/24 [phone]

## 2024-02-28 NOTE — Progress Notes (Addendum)
  Carryover admission to the Day Admitter.  I discussed this case with the EDP, Dr. Theadore.  Per these discussions:   This is a 83 year old female with history of CHF, COPD, who is being admitted with SIRS criteria after presenting with subjective fever, shortness of breath.   Vital signs in the ED were notable for temperature of 101.2.  CBC reflects open cell count of 12,400.  CT a chest with PE protocol as well as CT abdomen/pelvis are currently pending, although there is preliminary read on part of EDP for potential infiltrate on CTA chest PE, raising possibility of community-acquired pneumonia.   Lactic acid 1.9.  Blood cultures x 2 were collected in the ED followed by initiation of Rocephin .  I have placed an order for observation for further evaluation management of the above.  I have placed some additional preliminary admit orders via the adult multi-morbid admission order set. I have also added azithromycin  to the previously mentioned Rocephin  for empiric coverage of potential underlying reacquired pneumonia, with CTA chest result currently pending at this time.  Have also added on procalcitonin level and ordered magnesium  and phosphorus levels.  Prn acetaminophen  ordered.    Eva Pore, DO Hospitalist

## 2024-02-29 ENCOUNTER — Inpatient Hospital Stay (HOSPITAL_COMMUNITY)

## 2024-02-29 DIAGNOSIS — A419 Sepsis, unspecified organism: Secondary | ICD-10-CM

## 2024-02-29 DIAGNOSIS — J189 Pneumonia, unspecified organism: Secondary | ICD-10-CM | POA: Diagnosis not present

## 2024-02-29 LAB — BASIC METABOLIC PANEL WITH GFR
Anion gap: 9 (ref 5–15)
BUN: 20 mg/dL (ref 8–23)
CO2: 27 mmol/L (ref 22–32)
Calcium: 8.6 mg/dL — ABNORMAL LOW (ref 8.9–10.3)
Chloride: 103 mmol/L (ref 98–111)
Creatinine, Ser: 1.08 mg/dL — ABNORMAL HIGH (ref 0.44–1.00)
GFR, Estimated: 52 mL/min — ABNORMAL LOW (ref >60)
Glucose, Bld: 110 mg/dL — ABNORMAL HIGH (ref 70–99)
Potassium: 4.1 mmol/L (ref 3.5–5.1)
Sodium: 139 mmol/L (ref 135–145)

## 2024-02-29 LAB — MAGNESIUM: Magnesium: 1.6 mg/dL — ABNORMAL LOW (ref 1.7–2.4)

## 2024-02-29 LAB — CBC
HCT: 32.8 % — ABNORMAL LOW (ref 36.0–46.0)
Hemoglobin: 10.5 g/dL — ABNORMAL LOW (ref 12.0–15.0)
MCH: 29.6 pg (ref 26.0–34.0)
MCHC: 32 g/dL (ref 30.0–36.0)
MCV: 92.4 fL (ref 80.0–100.0)
Platelets: 162 K/uL (ref 150–400)
RBC: 3.55 MIL/uL — ABNORMAL LOW (ref 3.87–5.11)
RDW: 14.4 % (ref 11.5–15.5)
WBC: 5 K/uL (ref 4.0–10.5)
nRBC: 0 % (ref 0.0–0.2)

## 2024-02-29 LAB — C-REACTIVE PROTEIN: CRP: 4.3 mg/dL — ABNORMAL HIGH (ref <1.0)

## 2024-02-29 LAB — PROCALCITONIN: Procalcitonin: <0.1 ng/mL

## 2024-02-29 LAB — MRSA NEXT GEN BY PCR, NASAL: MRSA by PCR Next Gen: NOT DETECTED

## 2024-02-29 LAB — BRAIN NATRIURETIC PEPTIDE: B Natriuretic Peptide: 230.3 pg/mL — ABNORMAL HIGH (ref 0.0–100.0)

## 2024-02-29 MED ORDER — DOXYCYCLINE HYCLATE 100 MG PO TABS
100.0000 mg | ORAL_TABLET | Freq: Two times a day (BID) | ORAL | Status: DC
  Administered 2024-02-29 – 2024-03-05 (×15): 100 mg via ORAL
  Filled 2024-02-29 (×11): qty 1

## 2024-02-29 MED ORDER — SODIUM CHLORIDE 0.9 % IV SOLN
1.0000 g | INTRAVENOUS | Status: AC
  Administered 2024-02-29 – 2024-03-05 (×8): 1 g via INTRAVENOUS
  Filled 2024-02-29 (×6): qty 10

## 2024-02-29 MED ORDER — BUMETANIDE 1 MG PO TABS
1.0000 mg | ORAL_TABLET | Freq: Once | ORAL | Status: AC
  Administered 2024-02-29 (×2): 1 mg via ORAL
  Filled 2024-02-29: qty 1

## 2024-02-29 NOTE — Evaluation (Signed)
 Physical Therapy Evaluation Patient Details Name: Brittany Irwin MRN: [id number] DOB: [birth year] Today's Date: 02/29/2024  History of Present Illness  83 y.o. female presents with complaints of back pain and shortness of breath. Past medical history significant of COPD, chronic respiratory failure on 4 L, HfpEF, CAD, tachybradycardia syndrome s/p permanent pacemaker, Crohn's disease, SBO, psoriasis, migraine headaches, CKD stage III, and anxiety.  Clinical Impression  Pt presents with admitting diagnosis above. Pt today was able to ambulate in hallway with supervision using rollator. PTA pt reports that she mostly independent either using no AD or rollator. Pt appears to be fairly close to baseline however pt is requesting HHPT upon DC. PT will continue to follow.         If plan is discharge home, recommend the following: Assistance with cooking/housework   Can travel by private vehicle        Equipment Recommendations None recommended by PT  Recommendations for Other Services       Functional Status Assessment Patient has had a recent decline in their functional status and demonstrates the ability to make significant improvements in function in a reasonable and predictable amount of time.     Precautions / Restrictions Precautions Precautions: Fall Recall of Precautions/Restrictions: Intact Restrictions Weight Bearing Restrictions Per Provider Order: No      Mobility  Bed Mobility               General bed mobility comments: Up in recliner    Transfers Overall transfer level: Modified independent Equipment used: Rollator (4 wheels)                    Ambulation/Gait Ambulation/Gait assistance: Supervision Gait Distance (Feet): 250 Feet Assistive device: Rollator (4 wheels) Gait Pattern/deviations: Step-through pattern Gait velocity: decreased     General Gait Details: 1 standing rest break. no LOB noted.  Stairs            Wheelchair  Mobility     Tilt Bed    Modified Rankin (Stroke Patients Only)       Balance Overall balance assessment: Needs assistance Sitting-balance support: Feet supported Sitting balance-Leahy Scale: Good     Standing balance support: Reliant on assistive device for balance, During functional activity Standing balance-Leahy Scale: Fair Standing balance comment: mildly unsteady in unsupported stance                             Pertinent Vitals/Pain Pain Assessment Pain Assessment: No/denies pain    Home Living Family/patient expects to be discharged to:: Private residence Living Arrangements: Alone Available Help at Discharge: Family;Available PRN/intermittently Type of Home: Apartment Home Access: Level entry       Home Layout: One level Home Equipment: Agricultural consultant (2 wheels);Rollator (4 wheels);Grab bars - tub/shower;Hand held shower head Additional Comments: Pt reports plans for a shower chair. (Pulled from previous admission. Pt reports no change)    Prior Function Prior Level of Function : Independent/Modified Independent;Driving             Mobility Comments: 4L O2 baseline, needs rollator at times ADLs Comments: sister provides transportation, ind with ADLs, cooks     Extremity/Trunk Assessment   Upper Extremity Assessment Upper Extremity Assessment: Overall WFL for tasks assessed    Lower Extremity Assessment Lower Extremity Assessment: Overall WFL for tasks assessed    Cervical / Trunk Assessment Cervical / Trunk Assessment: Normal  Communication   Communication Communication: No  apparent difficulties    Cognition Arousal: Alert Behavior During Therapy: WFL for tasks assessed/performed   PT - Cognitive impairments: No apparent impairments                         Following commands: Intact       Cueing Cueing Techniques: Verbal cues     General Comments General comments (skin integrity, edema, etc.): VSS on 4L     Exercises     Assessment/Plan    PT Assessment Patient needs continued PT services  PT Problem List Cardiopulmonary status limiting activity;Decreased mobility;Decreased activity tolerance;Decreased strength       PT Treatment Interventions DME instruction;Gait training;Functional mobility training;Therapeutic activities;Therapeutic exercise;Balance training;Patient/family education    PT Goals (Current goals can be found in the Care Plan section)  Acute Rehab PT Goals Patient Stated Goal: return home PT Goal Formulation: With patient Time For Goal Achievement: 03/14/24 Potential to Achieve Goals: Good    Frequency Min 1X/week     Co-evaluation               AM-PAC PT 6 Clicks Mobility  Outcome Measure Help needed turning from your back to your side while in a flat bed without using bedrails?: None Help needed moving from lying on your back to sitting on the side of a flat bed without using bedrails?: None Help needed moving to and from a bed to a chair (including a wheelchair)?: None Help needed standing up from a chair using your arms (e.g., wheelchair or bedside chair)?: None Help needed to walk in hospital room?: A Little Help needed climbing 3-5 steps with a railing? : A Lot 6 Click Score: 21    End of Session Equipment Utilized During Treatment: Gait belt;Oxygen Activity Tolerance: Patient tolerated treatment well Patient left: in chair;with call bell/phone within reach Nurse Communication: Mobility status PT Visit Diagnosis: Unsteadiness on feet (R26.81)    Time: 8997-8980 PT Time Calculation (min) (ACUTE ONLY): 17 min   Charges:   PT Evaluation $PT Eval Moderate Complexity: 1 Mod   PT General Charges $$ ACUTE PT VISIT: 1 Visit         Sueellen NOVAK, PT, DPT Acute Rehab Services [phone]   Dailyn Kempner 02/29/2024, 3:08 PM

## 2024-02-29 NOTE — Plan of Care (Signed)
  Problem: Clinical Measurements: Goal: Ability to maintain clinical measurements within normal limits will improve Outcome: Progressing   Problem: Activity: Goal: Risk for activity intolerance will decrease Outcome: Progressing   Problem: Nutrition: Goal: Adequate nutrition will be maintained Outcome: Progressing   Problem: Coping: Goal: Level of anxiety will decrease Outcome: Progressing   Problem: Elimination: Goal: Will not experience complications related to bowel motility Outcome: Progressing   Problem: Safety: Goal: Ability to remain free from injury will improve Outcome: Progressing   Problem: Skin Integrity: Goal: Risk for impaired skin integrity will decrease Outcome: Progressing   Problem: Pain Managment: Goal: General experience of comfort will improve and/or be controlled Outcome: Progressing

## 2024-02-29 NOTE — Evaluation (Signed)
 Clinical/Bedside Swallow Evaluation Patient Details  Name: Brittany Irwin MRN: [id number] Date of Birth: 1941-03-15  Today's Date: 02/29/2024 Time: SLP Start Time (ACUTE ONLY): 1135 SLP Stop Time (ACUTE ONLY): 1150 SLP Time Calculation (min) (ACUTE ONLY): 15 min  Past Medical History:  Past Medical History:  Diagnosis Date   Acute CHF (congestive heart failure) (HCC) 01/02/2022   AKI (acute kidney injury) (HCC) 09/08/2020   Aspiration pneumonia (HCC) 01/05/2019   Atypical chest pain 03/11/2022   Bradycardia 03/14/2020   C. difficile colitis 04/03/2021   CHF (congestive heart failure) (HCC)    COPD (chronic obstructive pulmonary disease) (HCC)    Coronary artery disease    Tachycardia 05/31/2013   Past Surgical History:  Past Surgical History:  Procedure Laterality Date   ABDOMINAL HYSTERECTOMY     BOWEL RESECTION     CHOLECYSTECTOMY     HPI:  Brittany Irwin is an 83 yo female presenting to ED 8/11 with back pain and shortness of breath. Admitted with sepsis secondary to PNA. CTA shows RUL patchy airspace disease consistent with multifocal PNA and new patchy ground-glass disease in the RLL. Recently hospitalized 7/28-7/29 for SIRS with question of UTI vs upper respiratory infection.  SLP consulted due to recurrent PNA. PMH includes COPD, chronic respiratory failure on 4L, HFpEF, CAD, tachybradycardia syndrome s/p permanent pacemaker, Crohn's disease, SBO, migraine, CKD 3, anxiety    Assessment / Plan / Recommendation  Clinical Impression  Pt reports frequent reflux and regurgitation but otherwise denies difficulty swallowing. No s/s of dysphagia or aspiration were observed with self-fed trials of thin liquids or regular solids. Discussed proceeding with an MBS for further assessment given recurrent PNA but may also consider further esophageal w/u given her symptoms, which are suspected to be consistent with an esophageal component. Continue current diet pending results. SLP  Visit Diagnosis: Dysphagia, unspecified (R13.10)    Aspiration Risk  Mild aspiration risk    Diet Recommendation Regular;Thin liquid    Liquid Administration via: Straw;Cup Medication Administration: Whole meds with liquid Supervision: Patient able to self feed Compensations: Slow rate;Small sips/bites Postural Changes: Seated upright at 90 degrees;Remain upright for at least 30 minutes after po intake    Other  Recommendations Recommended Consults: Consider esophageal assessment Oral Care Recommendations: Oral care BID     Assistance Recommended at Discharge  Defer until completion of instrumental assessment  Functional Status Assessment    Frequency and Duration            Prognosis Prognosis for improved oropharyngeal function: Good      Swallow Study   General HPI: Brittany Irwin is an 83 yo female presenting to ED 8/11 with back pain and shortness of breath. Admitted with sepsis secondary to PNA. CTA shows RUL patchy airspace disease consistent with multifocal PNA and new patchy ground-glass disease in the RLL. Recently hospitalized 7/28-7/29 for SIRS with question of UTI vs upper respiratory infection.  SLP consulted due to recurrent PNA. PMH includes COPD, chronic respiratory failure on 4L, HFpEF, CAD, tachybradycardia syndrome s/p permanent pacemaker, Crohn's disease, SBO, migraine, CKD 3, anxiety Type of Study: Bedside Swallow Evaluation Previous Swallow Assessment: none in chart Diet Prior to this Study: Regular;Thin liquids (Level 0) Temperature Spikes Noted: No Respiratory Status: Nasal cannula History of Recent Intubation: No Behavior/Cognition: Alert;Cooperative Oral Cavity Assessment: Within Functional Limits Oral Care Completed by SLP: No Oral Cavity - Dentition: Missing dentition Vision: Functional for self-feeding Self-Feeding Abilities: Able to feed self Patient Positioning: Upright in bed  Baseline Vocal Quality: Normal Volitional Cough:  Strong Volitional Swallow: Able to elicit    Oral/Motor/Sensory Function Overall Oral Motor/Sensory Function: Within functional limits   Ice Chips Ice chips: Not tested   Thin Liquid Thin Liquid: Within functional limits Presentation: Straw;Self Fed    Nectar Thick Nectar Thick Liquid: Not tested   Honey Thick Honey Thick Liquid: Not tested   Puree Puree: Not tested   Solid     Solid: Within functional limits Presentation: Self Fed      Damien Blumenthal, M.A., CCC-SLP Speech Language Pathology, Acute Rehabilitation Services  Secure Chat preferred [phone]  02/29/2024,12:20 PM

## 2024-02-29 NOTE — Progress Notes (Signed)
 PROGRESS NOTE                                                                                                                                                                                                             Patient Demographics:    Brittany Irwin, is a 83 y.o. female, DOB - 1940/11/18, FMW:[id number]  Outpatient Primary MD for the patient is [name], MD    LOS - 1  Admit date - 02/28/2024    Chief Complaint  Patient presents with   Back Pain   Shortness of Breath       Brief Narrative (HPI from H&P)   83 y.o. female with medical history significant of COPD, chronic respiratory failure on 4 L, HfpEF, CAD, tachybradycardia syndrome s/p permanent pacemaker, Crohn's disease, SBO, psoriasis, migraine headaches, CKD stage III, and anxiety presents with complaints of back pain and shortness of breath. She is accompanied by her sister.   Records note she had just recently been hospitalized from 7/28 -7/29 for SIRS with question of a urinary tract infection versus possible upper respiratory infection, but thought likely secondary to the patient being acutely dehydrated.  Experiences a persistent cough, sometimes productive, along with episodes of shaking chills and feeling extremely cold, similar to previous pneumonia episodes.   She recently started on Eliquis  for atrial fibrillation, identified during a pacemaker check. She takes two tablets per day, one in the morning and one in the evening.  She presented to the hospital with shortness of breath diagnosed with acute hypoxic respiratory failure due to pneumonia.   Subjective:    Holli Lipps today has, No headache, No chest pain, No abdominal pain - No Nausea, No new weakness tingling or numbness, improved cough and shortness of breath   Assessment  & Plan :    Acute on chronic respiratory failure with hypoxia - Sepsis secondary to pneumonia Patient  presented with fever up to 101.2 F with leukocytosis.  Currently requiring 6 L of nasal cannula oxygen maintain O2 saturations greater than 90%.  CTA of the chest noted concerns for a multifocal right-sided pneumonia.  She has been placed on empiric antibiotics which are Rocephin  and doxycycline , follow cultures, encouraged to sit in chair use ice and flutter valve for pulmonary toiletry, speech evaluation to make sure she is not aspirating, outpatient pulmonary follow-up once  she is better.  Clinically better than before.   COPD Patient without significant wheezes or rhonchi appreciated on physical exam,  Continue home inhaler,  Levalbuterol  as needed for shortness of breath/wheezing   Elevated troponin  Acute.  Patient does not report having any specific chest pain.  High-sensitivity troponin 188-> 175.  EKG similar to prior.  Question if secondary to demand. Continue to monitor   Acute kidney injury  Creatinine noted to be elevated at 1.36 with BUN 27.  Baseline creatinine previously noted to be 0.8 when last checked. Give normal saline IV fluids at 100 mL/h for 1 L, Recheck kidney function in a.m.  Back pain Acute on chronic.  Patient reports having acute worsening of her back pain.  Denies having any significant injury to onset symptoms.  Home medication regimen includes Tylenol  4.  Oxycodone  as needed for pain   Heart failure with preserved EF Patient appears fairly euvolemic.  BNP elevated at 428.4, but lower than when previously checked last month.  Last echocardiogram noted EF to be 60 to 65% with grade 1 diastolic dysfunction when checked back in 10/15/2022. Strict I&O's and daily weights, Continue torsemide as needed   Tachybradycardia syndrome S/p PPM  Patient had a [employer] pacemaker placed back in 02/2023.  Followed by Dr. Luke of cardiology at [facility].   History of Crohn's disease   Patient does not report having any significant episodes of diarrhea or blood in stools.   Anxiety   Chronic.   Continue Xanax  as needed   Neuropathy  - Continue Lyrica  and gabapentin .   Hyperlipidemia  - Continue fenofibrate  and Zetia    GERD   - Continue PPI         Condition - Fair  Family Communication  : Darin Handing [phone]  on 02/29/2024 at 8:16 AM  Code Status :  Full  Consults  :  None  PUD Prophylaxis :  PPI   Procedures  :     CT head, CTA chest abdomen pelvis.  1. No acute intracranial CT findings or interval changes. Stable exam. 2. 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 3. Enlarged pulmonary trunk indicating arterial hypertension. No arterial embolus is seen. 4. Right upper lobe patchy airspace disease consistent with multifocal pneumonia. 5. New patchy ground-glass disease in the right lower lobe favored to be pneumonitis, versus asymmetric atelectasis. 6. New 5 mm rounded nodule in the posteromedial base of the right lower lobe, probably an inflammatory nodule. Follow-up study recommended after treatment of pneumonia. 7. Stable mediastinal and right hilar adenopathy. 8. Emphysema and bronchitis. 9. No acute findings in the abdomen or pelvis. 10. Chronic thickened folds in the stomach. 11. Diverticulosis without evidence of diverticulitis. 12. Pelvic floor laxity. 13. Osteopenia and degenerative change. Aortic Atherosclerosis (ICD10-I70.0) and Emphysema (ICD10-J43.9)      Disposition Plan  :    Status is: Inpatient   DVT Prophylaxis  :    SCDs Start: 02/28/24 0609 apixaban  (ELIQUIS ) tablet 5 mg     Lab Results  Component Value Date   PLT 162 02/29/2024    Diet :  Diet Order             Diet Heart Room service appropriate? Yes; Fluid consistency: Thin  Diet effective now                    Inpatient Medications  Scheduled Meds:  ALPRAZolam   1 mg Oral BID   apixaban   5 mg Oral BID  atenolol   25 mg Oral Daily   budesonide -glycopyrrolate -formoterol   2 puff Inhalation BID   ezetimibe   10 mg Oral QHS   fenofibrate    160 mg Oral Daily   guaiFENesin   600 mg Oral BID   pantoprazole   40 mg Oral Daily   pregabalin   50 mg Oral BID   sodium chloride  flush  3 mL Intravenous Q12H   Continuous Infusions:  doxycycline  (VIBRAMYCIN ) IV Stopped (02/29/24 0041)   PRN Meds:.acetaminophen  **OR** acetaminophen , bumetanide , cyclobenzaprine , levalbuterol , melatonin, ondansetron  (ZOFRAN ) IV, oxyCODONE -acetaminophen , trimethobenzamide      Objective:   Vitals:   02/29/24 0000 02/29/24 0200 02/29/24 0328 02/29/24 0400  BP: (!) 103/52   124/61  Pulse: 60 60  66  Resp: 13 15  16   Temp:    97.8 F (36.6 C)  TempSrc: Oral   Oral  SpO2: 90% 92%  93%  Weight:   77.2 kg   Height:        Wt Readings from Last 3 Encounters:  02/29/24 77.2 kg  02/15/24 70.1 kg  09/30/23 71.9 kg     Intake/Output Summary (Last 24 hours) at 02/29/2024 9191 Last data filed at 02/29/2024 0600 Gross per 24 hour  Intake 1425.08 ml  Output --  Net 1425.08 ml     Physical Exam  Awake Alert, No new F.N deficits, Normal affect [place].AT,PERRAL Supple Neck, No JVD,   Symmetrical Chest wall movement, Good air movement bilaterally, few fine crackles RRR,No Gallops,Rubs or new Murmurs,  +ve B.Sounds, Abd Soft, No tenderness,   No Cyanosis, Clubbing or edema     Data Review:    Recent Labs  Lab 02/28/24 0228 02/29/24 0518  WBC 12.4* 5.0  HGB 13.4 10.5*  HCT 41.6 32.8*  PLT 224 162  MCV 91.2 92.4  MCH 29.4 29.6  MCHC 32.2 32.0  RDW 14.2 14.4  LYMPHSABS 0.7  --   MONOABS 0.6  --   EOSABS 0.2  --   BASOSABS 0.1  --     Recent Labs  Lab 02/28/24 0228 02/28/24 0256 02/28/24 0258 02/28/24 0453 02/28/24 0526 02/29/24 0518 02/29/24 0544  NA 142  --   --   --   --  139  --   K 4.0  --   --   --   --  4.1  --   CL 100  --   --   --   --  103  --   CO2 28  --   --   --   --  27  --   ANIONGAP 14  --   --   --   --  9  --   GLUCOSE 129*  --   --   --   --  110*  --   BUN 27*  --   --   --   --  20  --   CREATININE 1.36*   --   --   --   --  1.08*  --   AST 35  --   --   --   --   --   --   ALT 12  --   --   --   --   --   --   ALKPHOS 40  --   --   --   --   --   --   BILITOT 0.8  --   --   --   --   --   --  ALBUMIN 4.0  --   --   --   --   --   --   PROCALCITON  --   --   --  <0.10  --  <0.10  --   LATICACIDVEN  --  1.9  --   --  1.9  --   --   INR 1.2  --   --   --   --   --   --   BNP  --   --  428.4*  --   --   --  230.3*  MG  --   --   --  1.5*  --  1.6*  --   PHOS  --   --   --  3.3  --   --   --   CALCIUM 9.5  --   --   --   --  8.6*  --       Recent Labs  Lab 02/28/24 0228 02/28/24 0256 02/28/24 0258 02/28/24 0453 02/28/24 0526 02/29/24 0518 02/29/24 0544  PROCALCITON  --   --   --  <0.10  --  <0.10  --   LATICACIDVEN  --  1.9  --   --  1.9  --   --   INR 1.2  --   --   --   --   --   --   BNP  --   --  428.4*  --   --   --  230.3*  MG  --   --   --  1.5*  --  1.6*  --   CALCIUM 9.5  --   --   --   --  8.6*  --     --------------------------------------------------------------------------------------------------------------- No results found for: CHOL, HDL, LDLCALC, LDLDIRECT, TRIG, CHOLHDL  No results found for: HGBA1C No results for input(s): TSH, T4TOTAL, FREET4, T3FREE, THYROIDAB in the last 72 hours. No results for input(s): VITAMINB12, FOLATE, FERRITIN, TIBC, IRON, RETICCTPCT in the last 72 hours. ------------------------------------------------------------------------------------------------------------------ Cardiac Enzymes No results for input(s): CKMB, TROPONINI, MYOGLOBIN in the last 168 hours.  Invalid input(s): CK  Micro Results Recent Results (from the past 240 hours)  Blood Culture (routine x 2)     Status: None (Preliminary result)   Collection Time: 02/28/24  2:28 AM   Specimen: BLOOD  Result Value Ref Range Status   Specimen Description BLOOD LEFT ANTECUBITAL  Final   Special Requests   Final    BOTTLES DRAWN  AEROBIC AND ANAEROBIC Blood Culture results may not be optimal due to an inadequate volume of blood received in culture bottles   Culture   Final    NO GROWTH 1 DAY Performed at [facility], [address], [place], [place] [address]    Report Status PENDING  Incomplete  Resp panel by RT-PCR (RSV, Flu A&B, Covid) Anterior Nasal Swab     Status: None   Collection Time: 02/28/24  2:31 AM   Specimen: Anterior Nasal Swab  Result Value Ref Range Status   SARS Coronavirus 2 by RT PCR NEGATIVE NEGATIVE Final   Influenza A by PCR NEGATIVE NEGATIVE Final   Influenza B by PCR NEGATIVE NEGATIVE Final    Comment: (NOTE) The Xpert Xpress SARS-CoV-2/FLU/RSV plus assay is intended as an aid in the diagnosis of influenza from Nasopharyngeal swab specimens and should not be used as a sole basis for treatment. Nasal washings and aspirates are unacceptable for Xpert Xpress SARS-CoV-2/FLU/RSV testing.  Fact Sheet  for Patients: BloggerCourse.com  Fact Sheet for Healthcare Providers: SeriousBroker.it  This test is not yet approved or cleared by the [place]  FDA and has been authorized for detection and/or diagnosis of SARS-CoV-2 by FDA under an Emergency Use Authorization (EUA). This EUA will remain in effect (meaning this test can be used) for the duration of the COVID-19 declaration under Section 564(b)(1) of the Act, 21 U.S.C. section 360bbb-3(b)(1), unless the authorization is terminated or revoked.     Resp Syncytial Virus by PCR NEGATIVE NEGATIVE Final    Comment: (NOTE) Fact Sheet for Patients: BloggerCourse.com  Fact Sheet for Healthcare Providers: SeriousBroker.it  This test is not yet approved or cleared by the [place]  FDA and has been authorized for detection and/or diagnosis of SARS-CoV-2 by FDA under an Emergency Use Authorization (EUA). This EUA will remain in  effect (meaning this test can be used) for the duration of the COVID-19 declaration under Section 564(b)(1) of the Act, 21 U.S.C. section 360bbb-3(b)(1), unless the authorization is terminated or revoked.  Performed at [facility], [address], [place], [place] [address]   Blood Culture (routine x 2)     Status: None (Preliminary result)   Collection Time: 02/28/24  2:33 AM   Specimen: BLOOD  Result Value Ref Range Status   Specimen Description BLOOD BLOOD RIGHT ARM  Final   Special Requests   Final    BOTTLES DRAWN AEROBIC ONLY Blood Culture results may not be optimal due to an inadequate volume of blood received in culture bottles   Culture   Final    NO GROWTH 1 DAY Performed at [facility], [address] [place], [place] [address]    Report Status PENDING  Incomplete    Radiology Report DG Chest Port 1 View Result Date: 02/29/2024 EXAM: 1 VIEW XRAY OF THE CHEST 02/29/2024 06:15:00 AM COMPARISON: 02/28/2024 CLINICAL HISTORY: Shortness of breath. FINDINGS: LUNGS AND PLEURA: New blunting of the left costophrenic angle, which may reflect a small effusion. Mild diffuse increase interstitial markings. Bibasilar atelectasis. Decreased lung volumes. HEART AND MEDIASTINUM: Stable cardiac enlargement. Left chest wall pacer noted with leads in the right atrial appendage and right ventricle. BONES AND SOFT TISSUES: Aortic atherosclerotic calcification. IMPRESSION: 1. Suspect new small left pleural effusion with increased interstitial markings suggestive of mild chf. 2. Low lung volumes with Bibasilar Atelectasis. 3. Stable cardiac enlargement with left chest wall pacer in place. Electronically signed by: Waddell Calk MD 02/29/2024 08:00 AM EDT RP Workstation: [id number]   CT HEAD WO CONTRAST ( ) Result Date: 02/28/2024 CLINICAL DATA:  Delirium, shortness of breath, back pain, chest pain, and sepsis. EXAM: CT HEAD WITHOUT CONTRAST CT ANGIOGRAPHY CHEST CT ABDOMEN AND PELVIS  WITH CONTRAST TECHNIQUE: Contiguous axial images were obtained from the base of the skull through the vertex without intravenous contrast. Multiplanar reconstructions were created and reviewed. Multidetector CT imaging of the chest was performed using the standard protocol during bolus administration of intravenous contrast. Multiplanar CT image reconstructions and MIPs were obtained to evaluate the vascular anatomy. Multidetector CT imaging of the abdomen and pelvis was performed using the standard protocol during bolus administration of intravenous contrast. RADIATION DOSE REDUCTION: This exam was performed according to the departmental dose-optimization program which includes automated exposure control, adjustment of the mA and/or kV according to patient size and/or use of iterative reconstruction technique. CONTRAST:  75mL OMNIPAQUE  IOHEXOL  350 MG/ML SOLN COMPARISON:  Head CT 02/14/2024, portable chest today, portable chest 02/14/2024, chest CT 09/30/2023,  abdomen and pelvis CT with contrast 01/02/2022, and abdomen and pelvis CT without contrast 08/11/2023. FINDINGS: CT HEAD WITHOUT CONTRAST FINDINGS Brain: There is mild atrophy and small-vessel disease. The ventricles are normal in size and position. No acute cortical based infarct, hemorrhage, mass or mass effect is seen. Basal cisterns are clear. Vascular: No hyperdense vessels or unexpected calcification. Skull: Negative for fractures or focal lesions. Sinuses/orbits: No acute findings. Old lens extractions. Clear sinuses and mastoids. Other: None. CTA CHEST FINDINGS Cardiovascular: Left chest dual lead pacing system again noted, unchanged. No significant pericardial fluid. Mild-to-moderate panchamber cardiomegaly. 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 There is atherosclerosis moderately in the aorta, scattered 2 vessel coronary calcifications LAD and circumflex, scattered plaque in the great vessels. There  is no aortic aneurysm, stenosis or dissection. There is an enlarged pulmonary trunk, unchanged measuring 3.7 cm indicating arterial hypertension. Arterial opacification is diagnostic. No arterial embolism is seen. No venous dilatation. Mediastinum/Nodes: There are a few bilateral tiny hypodense thyroid  nodules. No follow-up imaging is recommended. Axillary spaces are clear. There stable enlarged right mid hilar nodes to 1.3 cm in short axis, stable mediastinal adenopathy with right paratracheal nodes to 1.3 cm, single prevascular node 0.8 cm, subcarinal nodes up to 1.4 cm. No new or progressive adenopathy is seen. The thoracic trachea, main bronchi, and thoracic esophagus are unremarkable. Small hiatal hernia. Lungs/Pleura: Both lungs are moderately emphysematous with centrilobular changes predominating. There is no pleural effusion. Diffuse bronchial thickening. Chronic atelectasis again noted in the lateral segment right middle lobe. There is patchy airspace disease in the right upper lobe consistent with multifocal pneumonia. There are coarse atelectatic changes in both lower lobes and a few linear scar-like opacities both apices and bases. There is new patchy ground-glass disease in the right lower lobe favored to be pneumonitis, versus asymmetric atelectasis. There is a new 5 mm rounded nodule in the posteromedial base of the right lower lobe on 9:97 probably an inflammatory nodule. Stable 3 mm right lower lobe nodule on 9:113. There is a stable 4 mm left upper lobe nodule laterally on 9:42. Musculoskeletal: There is osteopenia, kyphosis and degenerative change of the thoracic spine. No acute or significant osseous findings. Review of the MIP images confirms the above findings. CT ABDOMEN and PELVIS FINDINGS Hepatobiliary: No focal liver abnormality is seen. Status post cholecystectomy. There is chronic mild prominence of the intrahepatic and extrahepatic bile ducts. No obstructing stone is seen. Pancreas:  Moderately atrophic.  Otherwise unremarkable. Spleen: No abnormality.  Small splenule at the hilum. Adrenals/Urinary Tract: No adrenal mass. Three Bosniak 1 left renal cysts are again noted, largest is in the upper pole measuring 2.8 cm. There are occasional bilateral Bosniak 2 subcentimeter cortical cysts for which are too small to characterize. No follow-up imaging is recommended. There is no urinary stone or obstruction. There is symmetric excretion on delayed images. The bladder is unremarkable for the degree of distension. Stomach/Bowel: Chronic thickened folds in the stomach. Normal caliber small bowel. Right hemicolectomy with primary end to side anastomosis again is noted. There is scattered colonic diverticulosis evidence of colitis or diverticulitis. Vascular/Lymphatic: Aortic atherosclerosis. No enlarged abdominal or pelvic lymph nodes. Reproductive: Status post hysterectomy. No adnexal masses. Other: Pelvic floor laxity.  No cystocele. Musculoskeletal: Osteopenia and degenerative change lumbar spine. No acute or significant osseous findings. Mild lumbar dextroscoliosis. Review of the MIP images confirms the above findings. IMPRESSION: 1. No acute intracranial CT findings or  interval changes. Stable exam. 2. 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 3. Enlarged pulmonary trunk indicating arterial hypertension. No arterial embolus is seen. 4. Right upper lobe patchy airspace disease consistent with multifocal pneumonia. 5. New patchy ground-glass disease in the right lower lobe favored to be pneumonitis, versus asymmetric atelectasis. 6. New 5 mm rounded nodule in the posteromedial base of the right lower lobe, probably an inflammatory nodule. Follow-up study recommended after treatment of pneumonia. 7. Stable mediastinal and right hilar adenopathy. 8. Emphysema and bronchitis. 9. No acute findings in the abdomen or pelvis. 10. Chronic thickened folds in the stomach. 11. Diverticulosis  without evidence of diverticulitis. 12. Pelvic floor laxity. 13. Osteopenia and degenerative change. Aortic Atherosclerosis (ICD10-I70.0) and Emphysema (ICD10-J43.9). Electronically Signed   By: Francis Quam M.D.   On: 02/28/2024 06:03   CT Angio Chest Pulmonary Embolism (PE) W or WO Contrast Result Date: 02/28/2024 CLINICAL DATA:  Delirium, shortness of breath, back pain, chest pain, and sepsis. EXAM: CT HEAD WITHOUT CONTRAST CT ANGIOGRAPHY CHEST CT ABDOMEN AND PELVIS WITH CONTRAST TECHNIQUE: Contiguous axial images were obtained from the base of the skull through the vertex without intravenous contrast. Multiplanar reconstructions were created and reviewed. Multidetector CT imaging of the chest was performed using the standard protocol during bolus administration of intravenous contrast. Multiplanar CT image reconstructions and MIPs were obtained to evaluate the vascular anatomy. Multidetector CT imaging of the abdomen and pelvis was performed using the standard protocol during bolus administration of intravenous contrast. RADIATION DOSE REDUCTION: This exam was performed according to the departmental dose-optimization program which includes automated exposure control, adjustment of the mA and/or kV according to patient size and/or use of iterative reconstruction technique. CONTRAST:  75mL OMNIPAQUE  IOHEXOL  350 MG/ML SOLN COMPARISON:  Head CT 02/14/2024, portable chest today, portable chest 02/14/2024, chest CT 09/30/2023, abdomen and pelvis CT with contrast 01/02/2022, and abdomen and pelvis CT without contrast 08/11/2023. FINDINGS: CT HEAD WITHOUT CONTRAST FINDINGS Brain: There is mild atrophy and small-vessel disease. The ventricles are normal in size and position. No acute cortical based infarct, hemorrhage, mass or mass effect is seen. Basal cisterns are clear. Vascular: No hyperdense vessels or unexpected calcification. Skull: Negative for fractures or focal lesions. Sinuses/orbits: No acute findings.  Old lens extractions. Clear sinuses and mastoids. Other: None. CTA CHEST FINDINGS Cardiovascular: Left chest dual lead pacing system again noted, unchanged. No significant pericardial fluid. Mild-to-moderate panchamber cardiomegaly. 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 There is atherosclerosis moderately in the aorta, scattered 2 vessel coronary calcifications LAD and circumflex, scattered plaque in the great vessels. There is no aortic aneurysm, stenosis or dissection. There is an enlarged pulmonary trunk, unchanged measuring 3.7 cm indicating arterial hypertension. Arterial opacification is diagnostic. No arterial embolism is seen. No venous dilatation. Mediastinum/Nodes: There are a few bilateral tiny hypodense thyroid  nodules. No follow-up imaging is recommended. Axillary spaces are clear. There stable enlarged right mid hilar nodes to 1.3 cm in short axis, stable mediastinal adenopathy with right paratracheal nodes to 1.3 cm, single prevascular node 0.8 cm, subcarinal nodes up to 1.4 cm. No new or progressive adenopathy is seen. The thoracic trachea, main bronchi, and thoracic esophagus are unremarkable. Small hiatal hernia. Lungs/Pleura: Both lungs are moderately emphysematous with centrilobular changes predominating. There is no pleural effusion. Diffuse bronchial thickening. Chronic atelectasis again noted in the lateral segment right middle lobe. There is patchy airspace disease in the right upper lobe consistent with multifocal  pneumonia. There are coarse atelectatic changes in both lower lobes and a few linear scar-like opacities both apices and bases. There is new patchy ground-glass disease in the right lower lobe favored to be pneumonitis, versus asymmetric atelectasis. There is a new 5 mm rounded nodule in the posteromedial base of the right lower lobe on 9:97 probably an inflammatory nodule. Stable 3 mm right lower lobe nodule on 9:113. There is a  stable 4 mm left upper lobe nodule laterally on 9:42. Musculoskeletal: There is osteopenia, kyphosis and degenerative change of the thoracic spine. No acute or significant osseous findings. Review of the MIP images confirms the above findings. CT ABDOMEN and PELVIS FINDINGS Hepatobiliary: No focal liver abnormality is seen. Status post cholecystectomy. There is chronic mild prominence of the intrahepatic and extrahepatic bile ducts. No obstructing stone is seen. Pancreas: Moderately atrophic.  Otherwise unremarkable. Spleen: No abnormality.  Small splenule at the hilum. Adrenals/Urinary Tract: No adrenal mass. Three Bosniak 1 left renal cysts are again noted, largest is in the upper pole measuring 2.8 cm. There are occasional bilateral Bosniak 2 subcentimeter cortical cysts for which are too small to characterize. No follow-up imaging is recommended. There is no urinary stone or obstruction. There is symmetric excretion on delayed images. The bladder is unremarkable for the degree of distension. Stomach/Bowel: Chronic thickened folds in the stomach. Normal caliber small bowel. Right hemicolectomy with primary end to side anastomosis again is noted. There is scattered colonic diverticulosis evidence of colitis or diverticulitis. Vascular/Lymphatic: Aortic atherosclerosis. No enlarged abdominal or pelvic lymph nodes. Reproductive: Status post hysterectomy. No adnexal masses. Other: Pelvic floor laxity.  No cystocele. Musculoskeletal: Osteopenia and degenerative change lumbar spine. No acute or significant osseous findings. Mild lumbar dextroscoliosis. Review of the MIP images confirms the above findings. IMPRESSION: 1. No acute intracranial CT findings or interval changes. Stable exam. 2. 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 3. Enlarged pulmonary trunk indicating arterial hypertension. No arterial embolus is seen. 4. Right upper lobe patchy airspace disease consistent with multifocal  pneumonia. 5. New patchy ground-glass disease in the right lower lobe favored to be pneumonitis, versus asymmetric atelectasis. 6. New 5 mm rounded nodule in the posteromedial base of the right lower lobe, probably an inflammatory nodule. Follow-up study recommended after treatment of pneumonia. 7. Stable mediastinal and right hilar adenopathy. 8. Emphysema and bronchitis. 9. No acute findings in the abdomen or pelvis. 10. Chronic thickened folds in the stomach. 11. Diverticulosis without evidence of diverticulitis. 12. Pelvic floor laxity. 13. Osteopenia and degenerative change. Aortic Atherosclerosis (ICD10-I70.0) and Emphysema (ICD10-J43.9). Electronically Signed   By: Francis Quam M.D.   On: 02/28/2024 06:03   CT ABDOMEN PELVIS W CONTRAST Result Date: 02/28/2024 CLINICAL DATA:  Delirium, shortness of breath, back pain, chest pain, and sepsis. EXAM: CT HEAD WITHOUT CONTRAST CT ANGIOGRAPHY CHEST CT ABDOMEN AND PELVIS WITH CONTRAST TECHNIQUE: Contiguous axial images were obtained from the base of the skull through the vertex without intravenous contrast. Multiplanar reconstructions were created and reviewed. Multidetector CT imaging of the chest was performed using the standard protocol during bolus administration of intravenous contrast. Multiplanar CT image reconstructions and MIPs were obtained to evaluate the vascular anatomy. Multidetector CT imaging of the abdomen and pelvis was performed using the standard protocol during bolus administration of intravenous contrast. RADIATION DOSE REDUCTION: This exam was performed according to the departmental dose-optimization program which includes automated exposure control, adjustment of the mA and/or kV according to patient size and/or use of iterative reconstruction  technique. CONTRAST:  75mL OMNIPAQUE  IOHEXOL  350 MG/ML SOLN COMPARISON:  Head CT 02/14/2024, portable chest today, portable chest 02/14/2024, chest CT 09/30/2023, abdomen and pelvis CT with contrast  01/02/2022, and abdomen and pelvis CT without contrast 08/11/2023. FINDINGS: CT HEAD WITHOUT CONTRAST FINDINGS Brain: There is mild atrophy and small-vessel disease. The ventricles are normal in size and position. No acute cortical based infarct, hemorrhage, mass or mass effect is seen. Basal cisterns are clear. Vascular: No hyperdense vessels or unexpected calcification. Skull: Negative for fractures or focal lesions. Sinuses/orbits: No acute findings. Old lens extractions. Clear sinuses and mastoids. Other: None. CTA CHEST FINDINGS Cardiovascular: Left chest dual lead pacing system again noted, unchanged. No significant pericardial fluid. Mild-to-moderate panchamber cardiomegaly. 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 There is atherosclerosis moderately in the aorta, scattered 2 vessel coronary calcifications LAD and circumflex, scattered plaque in the great vessels. There is no aortic aneurysm, stenosis or dissection. There is an enlarged pulmonary trunk, unchanged measuring 3.7 cm indicating arterial hypertension. Arterial opacification is diagnostic. No arterial embolism is seen. No venous dilatation. Mediastinum/Nodes: There are a few bilateral tiny hypodense thyroid  nodules. No follow-up imaging is recommended. Axillary spaces are clear. There stable enlarged right mid hilar nodes to 1.3 cm in short axis, stable mediastinal adenopathy with right paratracheal nodes to 1.3 cm, single prevascular node 0.8 cm, subcarinal nodes up to 1.4 cm. No new or progressive adenopathy is seen. The thoracic trachea, main bronchi, and thoracic esophagus are unremarkable. Small hiatal hernia. Lungs/Pleura: Both lungs are moderately emphysematous with centrilobular changes predominating. There is no pleural effusion. Diffuse bronchial thickening. Chronic atelectasis again noted in the lateral segment right middle lobe. There is patchy airspace disease in the right upper lobe  consistent with multifocal pneumonia. There are coarse atelectatic changes in both lower lobes and a few linear scar-like opacities both apices and bases. There is new patchy ground-glass disease in the right lower lobe favored to be pneumonitis, versus asymmetric atelectasis. There is a new 5 mm rounded nodule in the posteromedial base of the right lower lobe on 9:97 probably an inflammatory nodule. Stable 3 mm right lower lobe nodule on 9:113. There is a stable 4 mm left upper lobe nodule laterally on 9:42. Musculoskeletal: There is osteopenia, kyphosis and degenerative change of the thoracic spine. No acute or significant osseous findings. Review of the MIP images confirms the above findings. CT ABDOMEN and PELVIS FINDINGS Hepatobiliary: No focal liver abnormality is seen. Status post cholecystectomy. There is chronic mild prominence of the intrahepatic and extrahepatic bile ducts. No obstructing stone is seen. Pancreas: Moderately atrophic.  Otherwise unremarkable. Spleen: No abnormality.  Small splenule at the hilum. Adrenals/Urinary Tract: No adrenal mass. Three Bosniak 1 left renal cysts are again noted, largest is in the upper pole measuring 2.8 cm. There are occasional bilateral Bosniak 2 subcentimeter cortical cysts for which are too small to characterize. No follow-up imaging is recommended. There is no urinary stone or obstruction. There is symmetric excretion on delayed images. The bladder is unremarkable for the degree of distension. Stomach/Bowel: Chronic thickened folds in the stomach. Normal caliber small bowel. Right hemicolectomy with primary end to side anastomosis again is noted. There is scattered colonic diverticulosis evidence of colitis or diverticulitis. Vascular/Lymphatic: Aortic atherosclerosis. No enlarged abdominal or pelvic lymph nodes. Reproductive: Status post hysterectomy. No adnexal masses. Other: Pelvic floor laxity.  No cystocele. Musculoskeletal: Osteopenia and degenerative  change lumbar spine. No acute  or significant osseous findings. Mild lumbar dextroscoliosis. Review of the MIP images confirms the above findings. IMPRESSION: 1. No acute intracranial CT findings or interval changes. Stable exam. 2. 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 3. Enlarged pulmonary trunk indicating arterial hypertension. No arterial embolus is seen. 4. Right upper lobe patchy airspace disease consistent with multifocal pneumonia. 5. New patchy ground-glass disease in the right lower lobe favored to be pneumonitis, versus asymmetric atelectasis. 6. New 5 mm rounded nodule in the posteromedial base of the right lower lobe, probably an inflammatory nodule. Follow-up study recommended after treatment of pneumonia. 7. Stable mediastinal and right hilar adenopathy. 8. Emphysema and bronchitis. 9. No acute findings in the abdomen or pelvis. 10. Chronic thickened folds in the stomach. 11. Diverticulosis without evidence of diverticulitis. 12. Pelvic floor laxity. 13. Osteopenia and degenerative change. Aortic Atherosclerosis (ICD10-I70.0) and Emphysema (ICD10-J43.9). Electronically Signed   By: Francis Quam M.D.   On: 02/28/2024 06:03   DG Chest Port 1 View Result Date: 02/28/2024 CLINICAL DATA:  Shortness of breath EXAM: PORTABLE CHEST 1 VIEW COMPARISON:  02/14/2024 FINDINGS: Cardiac shadow is enlarged but stable. Pacing device is again seen. Lungs are well aerated bilaterally. No focal infiltrate or sizable effusion is noted. IMPRESSION: No acute abnormality noted. Electronically Signed   By: Oneil Devonshire M.D.   On: 02/28/2024 03:42     Signature  -   Lavada Stank M.D on 02/29/2024 at 8:08 AM   -  To page go to [email or web address]

## 2024-02-29 NOTE — Progress Notes (Signed)
 Please contact the patient concerning their result with the following instructions: Uric acid is elevated please start allopurinal 50mg  daily for gout prevention and lowering uric acid

## 2024-02-29 NOTE — Plan of Care (Signed)

## 2024-03-01 ENCOUNTER — Inpatient Hospital Stay (HOSPITAL_COMMUNITY)

## 2024-03-01 DIAGNOSIS — J189 Pneumonia, unspecified organism: Secondary | ICD-10-CM | POA: Diagnosis not present

## 2024-03-01 DIAGNOSIS — A419 Sepsis, unspecified organism: Secondary | ICD-10-CM | POA: Diagnosis not present

## 2024-03-01 MED ORDER — MAGNESIUM SULFATE 4 GM/100ML IV SOLN
4.0000 g | Freq: Once | INTRAVENOUS | Status: AC
  Administered 2024-03-01 (×2): 4 g via INTRAVENOUS
  Filled 2024-03-01: qty 100

## 2024-03-01 NOTE — Progress Notes (Addendum)
   03/01/24 1313  Mobility  Activity Dangled on edge of bed  Level of Assistance Standby assist, set-up cues, supervision of patient - no hands on  Assistive Device Four wheel walker  Activity Response Tolerated well  Mobility Referral Yes  Mobility visit 1 Mobility  Mobility Specialist Start Time (ACUTE ONLY) 1313  Mobility Specialist Stop Time (ACUTE ONLY) 1323  Mobility Specialist Time Calculation (min) (ACUTE ONLY) 10 min   Mobility Specialist: Progress Note- Visits: 2  Pre-Mobility:      HR 73, SpO2 95% 4L Post-Mobility:    HR  64 , SpO2 95% 4L  Pt agreeable to mobility session - received in bed. Pt was asymptomatic throughout session with no complaints. Pt was adamant about sitting EOB and not in chair to eat lunch. Returned to EOB with all needs met - call bell within reach. Bed alarm on. Requesting to walk after lunch.   _____________________________________________________________________________  During Mobility: HR        SpO2 87-92% 4L Post-Mobility:    HR  97, SpO2 93% 4L  Pt agreeable to mobility session - requesting ambulation - received in bed.  Pt ambulated to BR, Pericare completed ind. C/o stomach upset, being thirsty, LLE pain, RLE hip pain, and L sided and lower back pain.  Returned to EOB with all needs met - call bell within reach. Bed alarm on. Pt taken off tele per RN, reconnected after ambulation.   Virgle Boards, BS Mobility Specialist Please contact via SecureChat or  Rehab office at [phone].

## 2024-03-01 NOTE — Progress Notes (Signed)
 PROGRESS NOTE                                                                                                                                                                                                             Patient Demographics:    Brittany Irwin, is a 83 y.o. female, DOB - October 11, 1940, FMW:[id number]  Outpatient Primary MD for the patient is [name], MD    LOS - 2  Admit date - 02/28/2024    Chief Complaint  Patient presents with   Back Pain   Shortness of Breath       Brief Narrative (HPI from H&P)   83 y.o. female with medical history significant of COPD, chronic respiratory failure on 4 L, HfpEF, CAD, tachybradycardia syndrome s/p permanent pacemaker, Crohn's disease, SBO, psoriasis, migraine headaches, CKD stage III, and anxiety presents with complaints of back pain and shortness of breath. She is accompanied by her sister.   Records note she had just recently been hospitalized from 7/28 -7/29 for SIRS with question of a urinary tract infection versus possible upper respiratory infection, but thought likely secondary to the patient being acutely dehydrated.  Experiences a persistent cough, sometimes productive, along with episodes of shaking chills and feeling extremely cold, similar to previous pneumonia episodes.   She recently started on Eliquis  for atrial fibrillation, identified during a pacemaker check. She takes two tablets per day, one in the morning and one in the evening.  She presented to the hospital with shortness of breath diagnosed with acute hypoxic respiratory failure due to pneumonia.   Subjective:   Patient in bed, appears comfortable, denies any headache, no fever, no chest pain or pressure, Brueggen cough and shortness of breath , no abdominal pain. No focal weakness.   Assessment  & Plan :    Acute on chronic respiratory failure with hypoxia - Sepsis secondary to  pneumonia Patient presented with fever up to 101.2 F with leukocytosis.  Currently requiring 6 L of nasal cannula oxygen maintain O2 saturations greater than 90%.  CTA of the chest noted concerns for a multifocal right-sided pneumonia.  She has been placed on empiric antibiotics which are Rocephin  and doxycycline , follow cultures, encouraged to sit in chair use ice and flutter valve for pulmonary toiletry, speech evaluation to make sure she is not aspirating, likely MBS on 03/01/2024,  outpatient pulmonary follow-up once she is better.  Clinically better than before.   COPD Patient without significant wheezes or rhonchi appreciated on physical exam,  Continue home inhaler,  Levalbuterol  as needed for shortness of breath/wheezing   Elevated troponin  Acute.  Patient does not report having any specific chest pain.  High-sensitivity troponin 188-> 175.  EKG similar to prior.  Question if secondary to demand. Continue to monitor   Acute kidney injury  Creatinine noted to be elevated at 1.36 with BUN 27.  Baseline creatinine previously noted to be 0.8 when last checked. Give normal saline IV fluids at 100 mL/h for 1 L, Recheck kidney function in a.m.  Back pain Acute on chronic.  Patient reports having acute worsening of her back pain.  Denies having any significant injury to onset symptoms.  Home medication regimen includes Tylenol  4.  Oxycodone  as needed for pain   Heart failure with preserved EF Patient appears fairly euvolemic.  BNP elevated at 428.4, but lower than when previously checked last month.  Last echocardiogram noted EF to be 60 to 65% with grade 1 diastolic dysfunction when checked back in 10/15/2022. Strict I&O's and daily weights, Continue torsemide as needed   Tachybradycardia syndrome S/p PPM  Patient had a [employer] pacemaker placed back in 02/2023.  Followed by Dr. Luke of cardiology at [facility].   History of Crohn's disease   Patient does not report having any significant episodes  of diarrhea or blood in stools.   Anxiety  Chronic.   Continue Xanax  as needed   Neuropathy  - Continue Lyrica  and gabapentin .   Hypomagnesemia.  Replaced.    Hyperlipidemia  - Continue fenofibrate  and Zetia    GERD   - Continue PPI         Condition - Fair  Family Communication  : Darin Handing [phone]  on 02/29/2024 at 8:16 AM  Code Status :  Full  Consults  :  None  PUD Prophylaxis :  PPI   Procedures  :     CT head, CTA chest abdomen pelvis.  1. No acute intracranial CT findings or interval changes. Stable exam. 2. 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 3. Enlarged pulmonary trunk indicating arterial hypertension. No arterial embolus is seen. 4. Right upper lobe patchy airspace disease consistent with multifocal pneumonia. 5. New patchy ground-glass disease in the right lower lobe favored to be pneumonitis, versus asymmetric atelectasis. 6. New 5 mm rounded nodule in the posteromedial base of the right lower lobe, probably an inflammatory nodule. Follow-up study recommended after treatment of pneumonia. 7. Stable mediastinal and right hilar adenopathy. 8. Emphysema and bronchitis. 9. No acute findings in the abdomen or pelvis. 10. Chronic thickened folds in the stomach. 11. Diverticulosis without evidence of diverticulitis. 12. Pelvic floor laxity. 13. Osteopenia and degenerative change. Aortic Atherosclerosis (ICD10-I70.0) and Emphysema (ICD10-J43.9)      Disposition Plan  :    Status is: Inpatient   DVT Prophylaxis  :    SCDs Start: 02/28/24 9390 apixaban  (ELIQUIS ) tablet 5 mg     Lab Results  Component Value Date   PLT 162 02/29/2024    Diet :  Diet Order             Diet Heart Room service appropriate? Yes; Fluid consistency: Thin  Diet effective now                    Inpatient Medications  Scheduled Meds:  ALPRAZolam   1 mg  Oral BID   apixaban   5 mg Oral BID   atenolol   25 mg Oral Daily    budesonide -glycopyrrolate -formoterol   2 puff Inhalation BID   doxycycline   100 mg Oral Q12H   ezetimibe   10 mg Oral QHS   fenofibrate   160 mg Oral Daily   guaiFENesin   600 mg Oral BID   pantoprazole   40 mg Oral Daily   pregabalin   50 mg Oral BID   sodium chloride  flush  3 mL Intravenous Q12H   Continuous Infusions:  cefTRIAXone  (ROCEPHIN )  IV 1 g (02/29/24 0914)   magnesium  sulfate bolus IVPB 4 g (03/01/24 0631)   PRN Meds:.acetaminophen  **OR** acetaminophen , cyclobenzaprine , levalbuterol , melatonin, ondansetron  (ZOFRAN ) IV, oxyCODONE -acetaminophen , trimethobenzamide      Objective:   Vitals:   02/29/24 2035 02/29/24 2335 03/01/24 0347 03/01/24 0358  BP: 139/72 (!) 103/59 106/62   Pulse: 83 76 62   Resp: 18 16 16    Temp: 97.6 F (36.4 C) (!) 97.5 F (36.4 C) (!) 97.2 F (36.2 C)   TempSrc:   Axillary   SpO2: 93% 95%    Weight:    74.8 kg  Height:        Wt Readings from Last 3 Encounters:  03/01/24 74.8 kg  02/15/24 70.1 kg  09/30/23 71.9 kg     Intake/Output Summary (Last 24 hours) at 03/01/2024 0726 Last data filed at 03/01/2024 0407 Gross per 24 hour  Intake 340 ml  Output 300 ml  Net 40 ml     Physical Exam  Awake Alert, No new F.N deficits, Normal affect [place].AT,PERRAL Supple Neck, No JVD,   Symmetrical Chest wall movement, Good air movement bilaterally, few fine crackles RRR,No Gallops,Rubs or new Murmurs,  +ve B.Sounds, Abd Soft, No tenderness,   No Cyanosis, Clubbing or edema     Data Review:    Recent Labs  Lab 02/28/24 0228 02/29/24 0518  WBC 12.4* 5.0  HGB 13.4 10.5*  HCT 41.6 32.8*  PLT 224 162  MCV 91.2 92.4  MCH 29.4 29.6  MCHC 32.2 32.0  RDW 14.2 14.4  LYMPHSABS 0.7  --   MONOABS 0.6  --   EOSABS 0.2  --   BASOSABS 0.1  --     Recent Labs  Lab 02/28/24 0228 02/28/24 0256 02/28/24 0258 02/28/24 0453 02/28/24 0526 02/29/24 0518 02/29/24 0544 02/29/24 0916  NA 142  --   --   --   --  139  --   --   K 4.0  --   --   --    --  4.1  --   --   CL 100  --   --   --   --  103  --   --   CO2 28  --   --   --   --  27  --   --   ANIONGAP 14  --   --   --   --  9  --   --   GLUCOSE 129*  --   --   --   --  110*  --   --   BUN 27*  --   --   --   --  20  --   --   CREATININE 1.36*  --   --   --   --  1.08*  --   --   AST 35  --   --   --   --   --   --   --  ALT 12  --   --   --   --   --   --   --   ALKPHOS 40  --   --   --   --   --   --   --   BILITOT 0.8  --   --   --   --   --   --   --   ALBUMIN 4.0  --   --   --   --   --   --   --   CRP  --   --   --   --   --   --   --  4.3*  PROCALCITON  --   --   --  <0.10  --  <0.10  --   --   LATICACIDVEN  --  1.9  --   --  1.9  --   --   --   INR 1.2  --   --   --   --   --   --   --   BNP  --   --  428.4*  --   --   --  230.3*  --   MG  --   --   --  1.5*  --  1.6*  --   --   PHOS  --   --   --  3.3  --   --   --   --   CALCIUM 9.5  --   --   --   --  8.6*  --   --       Recent Labs  Lab 02/28/24 0228 02/28/24 0256 02/28/24 0258 02/28/24 0453 02/28/24 0526 02/29/24 0518 02/29/24 0544 02/29/24 0916  CRP  --   --   --   --   --   --   --  4.3*  PROCALCITON  --   --   --  <0.10  --  <0.10  --   --   LATICACIDVEN  --  1.9  --   --  1.9  --   --   --   INR 1.2  --   --   --   --   --   --   --   BNP  --   --  428.4*  --   --   --  230.3*  --   MG  --   --   --  1.5*  --  1.6*  --   --   CALCIUM 9.5  --   --   --   --  8.6*  --   --     --------------------------------------------------------------------------------------------------------------- No results found for: CHOL, HDL, LDLCALC, LDLDIRECT, TRIG, CHOLHDL  No results found for: HGBA1C No results for input(s): TSH, T4TOTAL, FREET4, T3FREE, THYROIDAB in the last 72 hours. No results for input(s): VITAMINB12, FOLATE, FERRITIN, TIBC, IRON, RETICCTPCT in the last 72  hours. ------------------------------------------------------------------------------------------------------------------ Cardiac Enzymes No results for input(s): CKMB, TROPONINI, MYOGLOBIN in the last 168 hours.  Invalid input(s): CK  Micro Results Recent Results (from the past 240 hours)  Blood Culture (routine x 2)     Status: None (Preliminary result)   Collection Time: 02/28/24  2:28 AM   Specimen: BLOOD  Result Value Ref Range Status   Specimen Description BLOOD LEFT ANTECUBITAL  Final   Special Requests   Final    BOTTLES DRAWN AEROBIC AND ANAEROBIC Blood Culture results may not  be optimal due to an inadequate volume of blood received in culture bottles   Culture   Final    NO GROWTH 2 DAYS Performed at [facility], [address] [place], [place] [address]    Report Status PENDING  Incomplete  Resp panel by RT-PCR (RSV, Flu A&B, Covid) Anterior Nasal Swab     Status: None   Collection Time: 02/28/24  2:31 AM   Specimen: Anterior Nasal Swab  Result Value Ref Range Status   SARS Coronavirus 2 by RT PCR NEGATIVE NEGATIVE Final   Influenza A by PCR NEGATIVE NEGATIVE Final   Influenza B by PCR NEGATIVE NEGATIVE Final    Comment: (NOTE) The Xpert Xpress SARS-CoV-2/FLU/RSV plus assay is intended as an aid in the diagnosis of influenza from Nasopharyngeal swab specimens and should not be used as a sole basis for treatment. Nasal washings and aspirates are unacceptable for Xpert Xpress SARS-CoV-2/FLU/RSV testing.  Fact Sheet for Patients: BloggerCourse.com  Fact Sheet for Healthcare Providers: SeriousBroker.it  This test is not yet approved or cleared by the [place]  FDA and has been authorized for detection and/or diagnosis of SARS-CoV-2 by FDA under an Emergency Use Authorization (EUA). This EUA will remain in effect (meaning this test can be used) for the duration of the COVID-19 declaration under  Section 564(b)(1) of the Act, 21 U.S.C. section 360bbb-3(b)(1), unless the authorization is terminated or revoked.     Resp Syncytial Virus by PCR NEGATIVE NEGATIVE Final    Comment: (NOTE) Fact Sheet for Patients: BloggerCourse.com  Fact Sheet for Healthcare Providers: SeriousBroker.it  This test is not yet approved or cleared by the [place]  FDA and has been authorized for detection and/or diagnosis of SARS-CoV-2 by FDA under an Emergency Use Authorization (EUA). This EUA will remain in effect (meaning this test can be used) for the duration of the COVID-19 declaration under Section 564(b)(1) of the Act, 21 U.S.C. section 360bbb-3(b)(1), unless the authorization is terminated or revoked.  Performed at [facility], [address], [place], [place] [address]   Blood Culture (routine x 2)     Status: None (Preliminary result)   Collection Time: 02/28/24  2:33 AM   Specimen: BLOOD  Result Value Ref Range Status   Specimen Description BLOOD BLOOD RIGHT ARM  Final   Special Requests   Final    BOTTLES DRAWN AEROBIC ONLY Blood Culture results may not be optimal due to an inadequate volume of blood received in culture bottles   Culture   Final    NO GROWTH 2 DAYS Performed at [facility], [address], [place], [place] [address]    Report Status PENDING  Incomplete  MRSA Next Gen by PCR, Nasal     Status: None   Collection Time: 02/29/24  5:46 AM   Specimen: Nasal Mucosa; Nasal Swab  Result Value Ref Range Status   MRSA by PCR Next Gen NOT DETECTED NOT DETECTED Final    Comment: (NOTE) The GeneXpert MRSA Assay (FDA approved for NASAL specimens only), is one component of a comprehensive MRSA colonization surveillance program. It is not intended to diagnose MRSA infection nor to guide or monitor treatment for MRSA infections. Test performance is not FDA approved in patients less than 88 years old. Performed  at [facility], [address] [place], [place] [address]     Radiology Report DG Chest Port 1 View Result Date: 02/29/2024 EXAM: 1 VIEW XRAY OF THE CHEST 02/29/2024  06:15:00 AM COMPARISON: 02/28/2024 CLINICAL HISTORY: Shortness of breath. FINDINGS: LUNGS AND PLEURA: New blunting of the left costophrenic angle, which may reflect a small effusion. Mild diffuse increase interstitial markings. Bibasilar atelectasis. Decreased lung volumes. HEART AND MEDIASTINUM: Stable cardiac enlargement. Left chest wall pacer noted with leads in the right atrial appendage and right ventricle. BONES AND SOFT TISSUES: Aortic atherosclerotic calcification. IMPRESSION: 1. Suspect new small left pleural effusion with increased interstitial markings suggestive of mild chf. 2. Low lung volumes with Bibasilar Atelectasis. 3. Stable cardiac enlargement with left chest wall pacer in place. Electronically signed by: Waddell Calk MD 02/29/2024 08:00 AM EDT RP Workstation: [id number]     Signature  -   Lavada Stank M.D on 03/01/2024 at 7:26 AM   -  To page go to [email or web address]

## 2024-03-01 NOTE — Progress Notes (Signed)
 Speech Language Pathology Treatment: Dysphagia  Patient Details Name: Brittany Irwin MRN: [id number] DOB: 1941/05/15 Today's Date: 03/01/2024 Time: 8494-8475 SLP Time Calculation (min) (ACUTE ONLY): 19 min  Assessment / Plan / Recommendation Clinical Impression  Discussed and reviewed MBS with pt. Education was provided regarding aspiration and related adverse consequences such as recurrent PNA in addition to risk of dehydration with prolonged thickener use that may not reduce instances of aspiration. Pt requests to use nectar thick liquids if only temporarily while absorbing this new information related to her swallowing. She had sips of nectar thick liquids and followed commands to cough intermittently, which cleared superficial penetration on MBS earlier this date. Discussed with MD and will leave current diet in place. SLP will f/u.    HPI HPI: Brittany Irwin is an 83 yo female presenting to ED 8/11 with back pain and shortness of breath. Admitted with sepsis secondary to PNA. CTA shows RUL patchy airspace disease consistent with multifocal PNA and new patchy ground-glass disease in the RLL. Recently hospitalized 7/28-7/29 for SIRS with question of UTI vs upper respiratory infection.  SLP consulted due to recurrent PNA. PMH includes COPD, chronic respiratory failure on 4L, HFpEF, CAD, tachybradycardia syndrome s/p permanent pacemaker, Crohn's disease, SBO, migraine, CKD 3, anxiety      SLP Plan  Continue with current plan of care          Recommendations  Diet recommendations: Regular;Nectar-thick liquid Liquids provided via: Cup;Straw Medication Administration: Whole meds with puree Supervision: Patient able to self feed;Intermittent supervision to cue for compensatory strategies Compensations: Slow rate;Small sips/bites Postural Changes and/or Swallow Maneuvers: Seated upright 90 degrees;Upright 30-60 min after meal                  Oral care QID;Oral care before and  after PO   PRN Dysphagia, pharyngoesophageal phase (R13.14)     Continue with current plan of care     Damien Blumenthal, M.A., CCC-SLP Speech Language Pathology, Acute Rehabilitation Services  Secure Chat preferred [phone]   03/01/2024, 4:18 PM

## 2024-03-01 NOTE — Progress Notes (Signed)
 Modified Barium Swallow Study  Patient Details  Name: Brittany Irwin MRN: [id number] Date of Birth: Jun 12, 1941  Today's Date: 03/01/2024  Modified Barium Swallow completed.  Full report located under Chart Review in the Imaging Section.  History of Present Illness Brittany Irwin is an 83 yo female presenting to ED 8/11 with back pain and shortness of breath. Admitted with sepsis secondary to PNA. CTA shows RUL patchy airspace disease consistent with multifocal PNA and new patchy ground-glass disease in the RLL. Recently hospitalized 7/28-7/29 for SIRS with question of UTI vs upper respiratory infection.  SLP consulted due to recurrent PNA. PMH includes COPD, chronic respiratory failure on 4L, HFpEF, CAD, tachybradycardia syndrome s/p permanent pacemaker, Crohn's disease, SBO, migraine, CKD 3, anxiety   Clinical Impression Pt exhibits moderate pharyngeal dysphagia characterized primarily by mistiming and reduced sensation. The swallow is initiated at the pyriform sinuses and epiglottic inversion is incomplete, allowing airway invasion before and during the swallow. This was seen consistently with penetration above the vocal folds until she was challenged with larger sips, which resulted in aspiration with variable sensation (PAS 7, 8). A chin tuck posture did not improve airway protection. Nectar thick liquids result in penetration above the vocal folds that cued intermittent coughing clears (PAS 3). Additionally, retention of barium was noted when the 13 mm tablet was administered with thin liquids. This may contribute to the symptoms of globus and regurgitation that the pt described to this SLP during evalutation previous date (could consider further esophageal assessment). For now, recommend continuing regular diet but with nectar thick liquids pending ongoing education and discussion with the pt and medical team. Prolonged use of thickener is not recommended nor does it prevent aspiration or  decrease risk of adverse events that may occur in its presence. Will plan to f/u with MD and pt as able. Factors that may increase risk of adverse event in presence of aspiration Noe & Lianne 2021): Poor general health and/or compromised immunity;Respiratory or GI disease;Reduced cognitive function;Frail or deconditioned;Inadequate oral hygiene  Swallow Evaluation Recommendations Recommendations: PO diet PO Diet Recommendation: Regular;Mildly thick liquids (Level 2, nectar thick) Liquid Administration via: Cup;Straw Medication Administration: Whole meds with puree Supervision: Patient able to self-feed;Intermittent supervision/cueing for swallowing strategies Swallowing strategies  : Minimize environmental distractions;Slow rate;Small bites/sips Postural changes: Position pt fully upright for meals;Stay upright 30-60 min after meals Oral care recommendations: Oral care QID (4x/day);Oral care before PO Recommended consults: Consider Palliative care    Damien Blumenthal, M.A., CCC-SLP Speech Language Pathology, Acute Rehabilitation Services  Secure Chat preferred [phone]  03/01/2024,1:10 PM

## 2024-03-02 ENCOUNTER — Inpatient Hospital Stay (HOSPITAL_COMMUNITY)

## 2024-03-02 DIAGNOSIS — J9621 Acute and chronic respiratory failure with hypoxia: Secondary | ICD-10-CM | POA: Diagnosis not present

## 2024-03-02 DIAGNOSIS — I5030 Unspecified diastolic (congestive) heart failure: Secondary | ICD-10-CM

## 2024-03-02 DIAGNOSIS — J189 Pneumonia, unspecified organism: Secondary | ICD-10-CM | POA: Diagnosis not present

## 2024-03-02 DIAGNOSIS — R197 Diarrhea, unspecified: Secondary | ICD-10-CM

## 2024-03-02 DIAGNOSIS — A419 Sepsis, unspecified organism: Secondary | ICD-10-CM | POA: Diagnosis not present

## 2024-03-02 DIAGNOSIS — R7989 Other specified abnormal findings of blood chemistry: Secondary | ICD-10-CM | POA: Diagnosis not present

## 2024-03-02 LAB — BASIC METABOLIC PANEL WITH GFR
Anion gap: 6 (ref 5–15)
BUN: 19 mg/dL (ref 8–23)
CO2: 28 mmol/L (ref 22–32)
Calcium: 8.8 mg/dL — ABNORMAL LOW (ref 8.9–10.3)
Chloride: 104 mmol/L (ref 98–111)
Creatinine, Ser: 1.04 mg/dL — ABNORMAL HIGH (ref 0.44–1.00)
GFR, Estimated: 54 mL/min — ABNORMAL LOW (ref >60)
Glucose, Bld: 99 mg/dL (ref 70–99)
Potassium: 4.6 mmol/L (ref 3.5–5.1)
Sodium: 138 mmol/L (ref 135–145)

## 2024-03-02 LAB — CBC WITH DIFFERENTIAL/PLATELET
Abs Immature Granulocytes: 0.01 K/uL (ref 0.00–0.07)
Basophils Absolute: 0 K/uL (ref 0.0–0.1)
Basophils Relative: 1 %
Eosinophils Absolute: 0.2 K/uL (ref 0.0–0.5)
Eosinophils Relative: 3 %
HCT: 32.5 % — ABNORMAL LOW (ref 36.0–46.0)
Hemoglobin: 10.2 g/dL — ABNORMAL LOW (ref 12.0–15.0)
Immature Granulocytes: 0 %
Lymphocytes Relative: 17 %
Lymphs Abs: 0.8 K/uL (ref 0.7–4.0)
MCH: 29.2 pg (ref 26.0–34.0)
MCHC: 31.4 g/dL (ref 30.0–36.0)
MCV: 93.1 fL (ref 80.0–100.0)
Monocytes Absolute: 0.4 K/uL (ref 0.1–1.0)
Monocytes Relative: 8 %
Neutro Abs: 3.3 K/uL (ref 1.7–7.7)
Neutrophils Relative %: 71 %
Platelets: 167 K/uL (ref 150–400)
RBC: 3.49 MIL/uL — ABNORMAL LOW (ref 3.87–5.11)
RDW: 14.3 % (ref 11.5–15.5)
WBC: 4.7 K/uL (ref 4.0–10.5)
nRBC: 0 % (ref 0.0–0.2)

## 2024-03-02 LAB — BLOOD GAS, ARTERIAL
Acid-base deficit: 0.6 mmol/L (ref 0.0–2.0)
Bicarbonate: 27.3 mmol/L (ref 20.0–28.0)
O2 Saturation: 93.7 %
Patient temperature: 36.4
pCO2 arterial: 56 mmHg — ABNORMAL HIGH (ref 32–48)
pH, Arterial: 7.29 — ABNORMAL LOW (ref 7.35–7.45)
pO2, Arterial: 65 mmHg — ABNORMAL LOW (ref 83–108)

## 2024-03-02 LAB — MAGNESIUM: Magnesium: 2 mg/dL (ref 1.7–2.4)

## 2024-03-02 MED ORDER — METHYLPREDNISOLONE SODIUM SUCC 125 MG IJ SOLR
80.0000 mg | INTRAMUSCULAR | Status: DC
  Administered 2024-03-02 – 2024-03-03 (×2): 80 mg via INTRAVENOUS
  Filled 2024-03-02: qty 2

## 2024-03-02 MED ORDER — IPRATROPIUM BROMIDE 0.02 % IN SOLN
RESPIRATORY_TRACT | Status: AC
  Administered 2024-03-02: 0.5 mg
  Filled 2024-03-02: qty 2.5

## 2024-03-02 MED ORDER — METHYLPREDNISOLONE SODIUM SUCC 40 MG IJ SOLR
20.0000 mg | Freq: Once | INTRAMUSCULAR | Status: DC
  Filled 2024-03-02: qty 1

## 2024-03-02 MED ORDER — BUDESONIDE 0.5 MG/2ML IN SUSP
0.5000 mg | Freq: Two times a day (BID) | RESPIRATORY_TRACT | Status: DC
  Administered 2024-03-03 (×2): 0.5 mg via RESPIRATORY_TRACT
  Filled 2024-03-02 (×5): qty 2

## 2024-03-02 MED ORDER — ARFORMOTEROL TARTRATE 15 MCG/2ML IN NEBU
15.0000 ug | INHALATION_SOLUTION | Freq: Two times a day (BID) | RESPIRATORY_TRACT | Status: DC
  Administered 2024-03-03 – 2024-03-05 (×5): 15 ug via RESPIRATORY_TRACT
  Filled 2024-03-02 (×5): qty 2

## 2024-03-02 MED ORDER — REVEFENACIN 175 MCG/3ML IN SOLN
175.0000 ug | Freq: Every day | RESPIRATORY_TRACT | Status: DC
  Administered 2024-03-03 – 2024-03-05 (×3): 175 ug via RESPIRATORY_TRACT
  Filled 2024-03-02 (×3): qty 3

## 2024-03-02 MED ORDER — CHLORHEXIDINE GLUCONATE CLOTH 2 % EX PADS
6.0000 | MEDICATED_PAD | Freq: Every day | CUTANEOUS | Status: DC
  Administered 2024-03-03 – 2024-03-05 (×2): 6 via TOPICAL

## 2024-03-02 MED ORDER — IPRATROPIUM-ALBUTEROL 0.5-2.5 (3) MG/3ML IN SOLN
3.0000 mL | RESPIRATORY_TRACT | Status: DC
  Administered 2024-03-03 (×3): 3 mL via RESPIRATORY_TRACT
  Filled 2024-03-02 (×3): qty 3

## 2024-03-02 MED ORDER — IPRATROPIUM-ALBUTEROL 0.5-2.5 (3) MG/3ML IN SOLN: 3.0000 mL | Freq: Four times a day (QID) | RESPIRATORY_TRACT | Status: DC

## 2024-03-02 MED ORDER — FUROSEMIDE 10 MG/ML IJ SOLN
40.0000 mg | Freq: Once | INTRAMUSCULAR | Status: AC
  Administered 2024-03-02: 40 mg via INTRAVENOUS
  Filled 2024-03-02: qty 4

## 2024-03-02 NOTE — Care Management Important Message (Signed)
 Important Message  Patient Details  Name: Brittany Irwin MRN: [id number] Date of Birth: 1941/04/06   Important Message Given:  Yes - Medicare IM     [name] 03/02/2024, 2:46 PM

## 2024-03-02 NOTE — Plan of Care (Signed)

## 2024-03-02 NOTE — Care Management (Signed)
 Transition of Care [facility]) - Inpatient Brief Assessment   Patient Details  Name: Brittany Irwin MRN: [id number] Date of Birth: December 19, 1940  [facility] of [facility] CM/SW Contact:    Corean JAYSON Canary, RN Phone Number: 03/02/2024, 3:23 PM   Clinical Narrative: High readmission risk 83 year old lives in apartment  COPD, on oxygen at 2LPM chronically Presented with [facility], currently on 4LPM.  Continuing weaning if able back to baseline of 2 Lif unable to wean would need to obtain walking saturation qualifications.   TOC will follow  Transition of Care Asessment: Insurance and Status: Insurance coverage has been reviewed Patient has primary care physician: Yes Home environment has been reviewed: Lives with self oxygen at 2L   Prior/Current Home Services: Current home services Social Drivers of Health Review: SDOH reviewed no interventions necessary Readmission risk has been reviewed: Yes Transition of care needs: no transition of care needs at this time

## 2024-03-02 NOTE — Progress Notes (Signed)
   03/02/24 1335  Mobility  Activity Ambulated with assistance  Level of Assistance Standby assist, set-up cues, supervision of patient - no hands on  Assistive Device Front wheel walker  Distance Ambulated (ft) 10 ft  Activity Response Tolerated fair  Mobility Referral Yes  Mobility visit 1 Mobility  Mobility Specialist Start Time (ACUTE ONLY) 1335  Mobility Specialist Stop Time (ACUTE ONLY) 1344  Mobility Specialist Time Calculation (min) (ACUTE ONLY) 9 min   Mobility Specialist: Progress Note  Post-Mobility:    HR  62, SpO2 91% 4L   Pt agreeable to mobility session - Responding to call light -received in BR.  Pericare completed ind. C/o stomach upset and SOB with exertion. Returned to bed with all needs met - call bell within reach. Bed alarm on. Pt was not connected to tele, reconnected back to tele once in bed.    Virgle Boards, BS Mobility Specialist Please contact via SecureChat or  Rehab office at [phone].

## 2024-03-02 NOTE — Care Management Important Message (Signed)
 Important Message  Patient Details  Name: Brittany Irwin MRN: [id number] Date of Birth: 09-Oct-1940   Important Message Given:  Yes - Medicare IM     [name] 03/02/2024, 2:47 PM

## 2024-03-02 NOTE — Progress Notes (Signed)
 ABG sent to lab. Lab notified via phone at [phone]

## 2024-03-02 NOTE — Consult Note (Signed)
 NAME:  Brittany Irwin, MRN:  [id number], DOB:  June 22, 1941, LOS: 3 ADMISSION DATE:  02/28/2024, CONSULTATION DATE:  8/14 REFERRING MD:  Dr. 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  Patient recently admitted on 7/28-7/29 for UTI versus URI.  Readmitted to [facility] on 8/11 with SOB.  Found to be febrile 101.2 F and WBC 12.4.  O2 sat stable on 6L [place].  CXR with no acute abnormality.  COVID/flu/RSV negative.  UA with small leukocytes.  CTA chest concerning for multifocal pneumonia; no PE.  Cultures obtained and patient placed on Rocephin /azithromycin  for possible CAP.  BNP elevated 428.  On 8/14 patient was getting anxious/AMS and having worsening SOB.  Sats 70% on NRB.  Being placed on BiPAP.  Ordered Lasix .  CXR, VBG, EKG, troponins pending.  Placed on BiPAP.  PCCM consulted.  Pertinent  Medical History   Past Medical History:  Diagnosis Date   Acute CHF (congestive heart failure) (HCC) 01/02/2022   AKI (acute kidney injury) (HCC) 09/08/2020   Aspiration pneumonia (HCC) 01/05/2019   Atypical chest pain 03/11/2022   Bradycardia 03/14/2020   C. difficile colitis 04/03/2021   CHF (congestive heart failure) (HCC)    COPD (chronic obstructive pulmonary disease) (HCC)    Coronary artery disease    Tachycardia 05/31/2013     Significant Hospital Events: Including procedures, antibiotic start and stop dates in addition to other pertinent events   8/11 admit 8/14 worsening respiratory failure now on BiPAP; PCCM consulted  Interim History / Subjective:  Patient states breathing improved on bipap Sats 94% on 50% fio2  Objective    Blood pressure (!) 190/93, pulse (!) 102, temperature (!) 97.5 F (36.4 C), temperature source Oral,  resp. rate (!) 23, height 5' 4 (1.626 m), weight 74.1 kg, SpO2 (!) 88%.    FiO2 (%):  [36 %] 36 %  No intake or output data in the 24 hours ending 03/02/24 2234 Filed Weights   02/29/24 0328 03/01/24 0358 03/02/24 0459  Weight: 77.2 kg 74.8 kg 74.1 kg    Examination: General:  ill appearing female w/ some respiratory distress HEENT: MM pink/moist; bipap in place Neuro: AO; MAE to command CV: s1s2, paced irregular rate around 60s, no m/r/g PULM: coarse rhonchi/wheezing BS bilaterally; bipap 50% fio2  GI: soft, bsx4 active  Extremities: warm/dry, no edema  Resolved problem list   Assessment and Plan   Acute on chronic respiratory failure with hypoxia Multifocal pneumonia: Possible aspiration given history of dysphagia Hx of COPD HFpEF Plan: -patient improved w/ bipap; for now appears stable for progressive; if having worsening respiratory distress will transfer to icu -abg pending -cont bipap overnight -can hopefully wean to [place] vs. Heated HFNC in am -cont rocephin /doxy for possible cap; check rvp/urine legionella/strep -trend wbc/fever curve; follow cultures -given lasix ; monitor uop; bladder scan; consider foley -iv steroids; scheduled duoneb; change triple therapy inhaler to nebs while in acute distress on bipap  Elevated troponin: Likely demand AKI on CKD Acute on chronic back pain Tacky bradycardia syndrome s/p PPM Hx of Crohn's disease Diarrhea Anxiety Peripheral neuropathy Hypomagnesemia HLD GERD Plan: -per primary  Best Practice (right click and Reselect all SmartList Selections daily)   Per primary  8/14 updated sister [name]  over phone  Labs   CBC: Recent Labs  Lab 02/28/24 0228 02/29/24 0518 03/02/24 0518  WBC 12.4* 5.0 4.7  NEUTROABS 10.8*  --  3.3  HGB 13.4 10.5* 10.2*  HCT 41.6 32.8* 32.5*  MCV 91.2 92.4 93.1  PLT 224 162 167    Basic Metabolic Panel: Recent Labs  Lab 02/28/24 0228 02/28/24 0453 02/29/24 0518  03/02/24 0518  NA 142  --  139 138  K 4.0  --  4.1 4.6  CL 100  --  103 104  CO2 28  --  27 28  GLUCOSE 129*  --  110* 99  BUN 27*  --  20 19  CREATININE 1.36*  --  1.08* 1.04*  CALCIUM 9.5  --  8.6* 8.8*  MG  --  1.5* 1.6* 2.0  PHOS  --  3.3  --   --    GFR: Estimated Creatinine Clearance: 41.9 mL/min (A) (by C-G formula based on SCr of 1.04 mg/dL (H)). Recent Labs  Lab 02/28/24 0228 02/28/24 0256 02/28/24 0453 02/28/24 0526 02/29/24 0518 03/02/24 0518  PROCALCITON  --   --  <0.10  --  <0.10  --   WBC 12.4*  --   --   --  5.0 4.7  LATICACIDVEN  --  1.9  --  1.9  --   --     Liver Function Tests: Recent Labs  Lab 02/28/24 0228  AST 35  ALT 12  ALKPHOS 40  BILITOT 0.8  PROT 7.7  ALBUMIN 4.0   Recent Labs  Lab 02/28/24 0228  LIPASE 39   No results for input(s): AMMONIA in the last 168 hours.  ABG    Component Value Date/Time   HCO3 24.2 09/07/2023 1054   TCO2 29 10/14/2022 1122   ACIDBASEDEF 2.2 (H) 09/07/2023 1054   O2SAT 47.6 09/07/2023 1054     Coagulation Profile: Recent Labs  Lab 02/28/24 0228  INR 1.2    Cardiac Enzymes: No results for input(s): CKTOTAL, CKMB, CKMBINDEX, TROPONINI in the last 168 hours.  HbA1C: No results found for: HGBA1C  CBG: No results for input(s): GLUCAP in the last 168 hours.  Review of Systems:   Patient is on bipap; therefore, history has been obtained from chart review.    Past Medical History:  She,  has a past medical history of Acute CHF (congestive heart failure) (HCC) (01/02/2022), AKI (acute kidney injury) (HCC) (09/08/2020), Aspiration pneumonia (HCC) (01/05/2019), Atypical chest pain (03/11/2022), Bradycardia (03/14/2020), C. difficile colitis (04/03/2021), CHF (congestive heart failure) (HCC), COPD (chronic obstructive pulmonary disease) (HCC), Coronary artery disease, and Tachycardia (05/31/2013).   Surgical History:   Past Surgical History:  Procedure Laterality Date   ABDOMINAL  HYSTERECTOMY     BOWEL RESECTION     CHOLECYSTECTOMY       Social History:   reports that she has quit smoking. Her smoking use included cigarettes. She does not have any smokeless tobacco history on file. She reports that she does not currently use alcohol . She reports that she does not use drugs.   Family History:  Her family history is not on file.   Allergies Allergies  Allergen Reactions   Methylprednisolone  Other (See Comments)    Increased BP, HR, agitation, hallucinations    Shellfish Allergy Nausea And Vomiting    All SEAFOOD   Ativan  [Lorazepam ] Other (See Comments)    Psychosis   Azulfidine [Sulfasalazine] Other (See Comments)    Headache    Epipen [Epinephrine] Hypertension  Flagyl [Metronidazole] Hives   Motrin [Ibuprofen] Nausea Only and Other (See Comments)    Told not to take medication due to GI distress caused by crohn's disease.   Nsaids Other (See Comments)    Told not to take NSAIDs due to GI distress caused by crohn's disease   Penicillins Hives    12/2021, 11/2023 tolerated cephalosporins    Morphine And Codeine  Itching    Told not to take due to GI distress caused by crohn's disease     Home Medications  Prior to Admission medications   Medication Sig Start Date End Date Taking? Authorizing Provider  acetaminophen -codeine  (TYLENOL  #4) 300-60 MG tablet Take 1 tablet by mouth every 6 (six) hours as needed for pain. 12/31/21  Yes [provider]  ALPRAZolam  (XANAX ) 1 MG tablet Take 1 mg by mouth 2 (two) times daily. 01/02/22  Yes [provider]  apixaban  (ELIQUIS ) 5 MG TABS tablet Take 5 mg by mouth 2 (two) times daily.   Yes [provider]  atenolol  (TENORMIN ) 25 MG tablet Take 1 tablet (25 mg total) by mouth daily. 02/16/24  Yes Sheikh, Omair Latif, DO  bumetanide  (BUMEX ) 1 MG tablet Take 1 tablet (1 mg total) by mouth daily as needed. 02/15/24  Yes Sheikh, Omair Latif, DO  cyanocobalamin (,VITAMIN B-12,) 1000 MCG/ML injection  Inject 1,000 mcg into the muscle every 30 (thirty) days.   Yes [provider]  cyclobenzaprine  (FLEXERIL ) 5 MG tablet Take 5 mg by mouth daily as needed for muscle spasms. 10/13/22  Yes [provider]  diclofenac  Sodium (VOLTAREN  ARTHRITIS PAIN) 1 % GEL Apply 1 Application topically in the morning and at bedtime.   Yes [provider]  ergocalciferol (VITAMIN D2) 1.25 MG (50000 UT) capsule Take 50,000 Units by mouth once a week. Every thursday   Yes [provider]  ezetimibe  (ZETIA ) 10 MG tablet Take 10 mg by mouth at bedtime. 12/31/21  Yes [provider]  fenofibrate  micronized (LOFIBRA) 200 MG capsule Take 200 mg by mouth daily. 12/31/21  Yes [provider]  fluconazole  (DIFLUCAN ) 150 MG tablet Take 150 mg by mouth daily.   Yes [provider]  Fluticasone -Umeclidin-Vilant (TRELEGY ELLIPTA ) 200-62.5-25 MCG/ACT AEPB Inhale 1 puff into the lungs daily. 12/22/23  Yes   levalbuterol  (XOPENEX ) 0.63 MG/3ML nebulizer solution Take 3 mLs (0.63 mg total) by nebulization every 4 (four) hours as needed for wheezing or shortness of breath. 09/11/23 02/28/24 Yes Kc, Mennie, MD  loratadine  (CLARITIN ) 10 MG tablet Take 1 tablet (10 mg total) by mouth daily for 14 days. Patient taking differently: Take 10 mg by mouth daily as needed for allergies. 09/12/23 02/28/24 Yes Christobal Mennie, MD  Menthol, Topical Analgesic, (ICY HOT BACK EX) Apply 1 Application topically 2 (two) times daily as needed (back pain).   Yes [provider]  nitroGLYCERIN (NITROSTAT) 0.4 MG SL tablet Place 0.4 mg under the tongue every 5 (five) minutes x 3 doses as needed for chest pain. 12/31/21  Yes [provider]  omeprazole (PRILOSEC) 40 MG capsule Take 40 mg by mouth daily.   Yes [provider]  pregabalin  (LYRICA ) 50 MG capsule Take 50 mg by mouth 2 (two) times daily.   Yes [provider]  ondansetron  (ZOFRAN ) 4 MG tablet Take 1 tablet (4 mg total)  by mouth every 6 (six) hours as needed for nausea. Patient not taking: Reported on 02/28/2024 02/15/24   Sherrill Alejandro Donovan, DO     Critical care  time: NA       JD Emilio DEVONNA [facility] 03/02/2024, 10:34 PM  Please see [email or web address] for pager details.  From 7A-7P if no response, please call [phone]. After hours, please call ELink [phone].

## 2024-03-02 NOTE — Significant Event (Signed)
 Rapid Response Event Note   Reason for Call :  Shortness of breath  Initial Focused Assessment:  Patient is sitting on the side of the bed in tripod position, oxygen saturation in the 80s on the monitor on 6L, very anxious, stating she is having difficulty breathing, tachypnea, inspiratory and expiratory wheezing auscultated in upper lobes, very diminished in lower lobes. Mental status hard to assess to do anxiety and difficulty breathing. Patient is diaphoretic, skin cool and clammy. V-paced on the monitor.  Respiratory at bedside, breathing treatment given and patient helped back into bed. Patient began desating during breathing treatment, with SATs in the 70s, lips cyanotic. Patient placed on bipap with improvement. Patient now resting comfortably in the bed. Responds to voice, coarse crackles ausculted in all lobes.  VS: BP:190/93 O2: 82 RR:45 HR: 117     Interventions:  VS Breathing treatment (Xopenex ) Bipap Chest x-ray Lasix  EKG Scheduled Duo nebs  Plan of Care:  Obtain labs (ABG, trop), Solu-medrol  ordered. Critical care consulted and at bedside.  Dr. Perri called patients sister and updated on status.   Event Summary:   MD Notified: Dr. Perri Call Time: 2149 Arrival Time: 2152 End Time: 2308  Delon Daub, RN

## 2024-03-02 NOTE — Progress Notes (Signed)
 Physical Therapy Treatment Patient Details Name: Brittany Irwin MRN: [id number] DOB: [birth year] Today's Date: 03/02/2024   History of Present Illness 83 y.o. female presents with complaints of back pain and shortness of breath. Past medical history significant of COPD, chronic respiratory failure on 4 L, HfpEF, CAD, tachybradycardia syndrome s/p permanent pacemaker, Crohn's disease, SBO, psoriasis, migraine headaches, CKD stage III, and anxiety.    PT Comments  Pt tolerated treatment well today. Pt was able to progress ambulation in the hallway with rollator at supervision. No change in DC/DME recs at this time. Pt presents at or near baseline mobility. Pt has no further acute PT needs and will be signing off. Pt would benefit from continued mobility with mobility specialist during acute stay. Re consult PT if mobility status changes.     If plan is discharge home, recommend the following: Assistance with cooking/housework   Can travel by private vehicle        Equipment Recommendations  None recommended by PT    Recommendations for Other Services       Precautions / Restrictions Precautions Precautions: Fall Recall of Precautions/Restrictions: Intact Precaution/Restrictions Comments: 4L at baseline Restrictions Weight Bearing Restrictions Per Provider Order: No     Mobility  Bed Mobility Overal bed mobility: Independent                  Transfers Overall transfer level: Modified independent Equipment used: Rollator (4 wheels)                    Ambulation/Gait Ambulation/Gait assistance: Supervision Gait Distance (Feet): 600 Feet Assistive device: Rollator (4 wheels) Gait Pattern/deviations: Step-through pattern Gait velocity: decreased     General Gait Details: 1 standing rest break. no LOB noted.   Stairs             Wheelchair Mobility     Tilt Bed    Modified Rankin (Stroke Patients Only)       Balance Overall balance  assessment: Needs assistance Sitting-balance support: Feet supported Sitting balance-Leahy Scale: Good     Standing balance support: Reliant on assistive device for balance, During functional activity Standing balance-Leahy Scale: Fair Standing balance comment: mildly unsteady in unsupported stance                            Communication Communication Communication: No apparent difficulties  Cognition Arousal: Alert Behavior During Therapy: WFL for tasks assessed/performed   PT - Cognitive impairments: No apparent impairments                         Following commands: Intact      Cueing Cueing Techniques: Verbal cues  Exercises      General Comments General comments (skin integrity, edema, etc.): SpO2 above 94% on 4L. uses 4L at baseline.      Pertinent Vitals/Pain Pain Assessment Pain Assessment: No/denies pain    Home Living                          Prior Function            PT Goals (current goals can now be found in the care plan section) Progress towards PT goals: Goals met/education completed, patient discharged from PT    Frequency    Min 1X/week      PT Plan      Co-evaluation  AM-PAC PT 6 Clicks Mobility   Outcome Measure  Help needed turning from your back to your side while in a flat bed without using bedrails?: None Help needed moving from lying on your back to sitting on the side of a flat bed without using bedrails?: None Help needed moving to and from a bed to a chair (including a wheelchair)?: None Help needed standing up from a chair using your arms (e.g., wheelchair or bedside chair)?: None Help needed to walk in hospital room?: A Little Help needed climbing 3-5 steps with a railing? : A Lot 6 Click Score: 21    End of Session Equipment Utilized During Treatment: Gait belt;Oxygen Activity Tolerance: Patient tolerated treatment well Patient left: in bed;with call bell/phone  within reach Nurse Communication: Mobility status PT Visit Diagnosis: Unsteadiness on feet (R26.81)     Time: 9140-9084 PT Time Calculation (min) (ACUTE ONLY): 16 min  Charges:    $Gait Training: 8-22 mins PT General Charges $$ ACUTE PT VISIT: 1 Visit                     Jenah Vanasten B, PT, DPT Acute Rehab Services [phone]    Caldwell Kronenberger 03/02/2024, 11:10 AM

## 2024-03-02 NOTE — Progress Notes (Signed)
 PROGRESS NOTE                                                                                                                                                                                                             Patient Demographics:    Brittany Irwin, is a 83 y.o. female, DOB - [birth year], FMW:[id number]  Outpatient Primary MD for the patient is [name], MD    LOS - 3  Admit date - 02/28/2024    Chief Complaint  Patient presents with   Back Pain   Shortness of Breath       Brief Narrative (HPI from H&P)   83 y.o. female with medical history significant of COPD, chronic respiratory failure on 4 L, HfpEF, CAD, tachybradycardia syndrome s/p permanent pacemaker, Crohn's disease, SBO, psoriasis, migraine headaches, CKD stage III, and anxiety presents with complaints of back pain and shortness of breath. She is accompanied by her sister.   Records note she had just recently been hospitalized from 7/28 -7/29 for SIRS with question of a urinary tract infection versus possible upper respiratory infection, but thought likely secondary to the patient being acutely dehydrated.  Experiences a persistent cough, sometimes productive, along with episodes of shaking chills and feeling extremely cold, similar to previous pneumonia episodes.   She recently started on Eliquis  for atrial fibrillation, identified during a pacemaker check. She takes two tablets per day, one in the morning and one in the evening.  She presented to the hospital with shortness of breath diagnosed with acute hypoxic respiratory failure due to pneumonia.   Subjective:   Patient in bed, appears comfortable, denies any headache, no fever, no chest pain or pressure, no shortness of breath , no abdominal pain. No focal weakness.   Assessment  & Plan :    Acute on chronic respiratory failure with hypoxia - Sepsis secondary to aspiration pneumonia -  POA Patient presented with fever up to 101.2 F with leukocytosis.  Currently requiring 6 L of nasal cannula oxygen maintain O2 saturations greater than 90%.  CTA of the chest noted concerns for a multifocal right-sided pneumonia.  She has been placed on empiric antibiotics which are Rocephin  and doxycycline , follow cultures, encouraged to sit in chair use ice and flutter valve for pulmonary toiletry, per history likely has underlying dysphagia, confirmed by speech therapy via MBS,  on nectar thick liquids, patient counseled, improving, likely discharge in 1 to 2 days May require home oxygen.   COPD Patient without significant wheezes or rhonchi appreciated on physical exam,  Continue home inhaler,  Levalbuterol  as needed for shortness of breath/wheezing   Elevated troponin  Acute.  Patient does not report having any specific chest pain.  High-sensitivity troponin 188-> 175.  EKG similar to prior.  Question if secondary to demand. Continue to monitor   Acute kidney injury  Creatinine noted to be elevated at 1.36 with BUN 27.  Baseline creatinine previously noted to be 0.8 when last checked. Give normal saline IV fluids at 100 mL/h for 1 L, Recheck kidney function in a.m.  Back pain Acute on chronic.  Patient reports having acute worsening of her back pain.  Denies having any significant injury to onset symptoms.  Home medication regimen includes Tylenol  4.  Oxycodone  as needed for pain   Heart failure with preserved EF Patient appears fairly euvolemic.  BNP elevated at 428.4, but lower than when previously checked last month.  Last echocardiogram noted EF to be 60 to 65% with grade 1 diastolic dysfunction when checked back in 10/15/2022. Strict I&O's and daily weights, Continue torsemide as needed   Tachybradycardia syndrome S/p PPM  Patient had a [employer] pacemaker placed back in 02/2023.  Followed by Dr. Luke of cardiology at  [facility].   History of Crohn's disease   Patient does not report having  any significant episodes of diarrhea or blood in stools.   Anxiety  Chronic.   Continue Xanax  as needed   Neuropathy  - Continue Lyrica  and gabapentin .   Hypomagnesemia.  Replaced.    Hyperlipidemia  - Continue fenofibrate  and Zetia    GERD   - Continue PPI         Condition - Fair  Family Communication  : Darin Handing [phone]  on 02/29/2024 at 8:16 AM  Code Status :  Full  Consults  :  None  PUD Prophylaxis :  PPI   Procedures  :     CT head, CTA chest abdomen pelvis.  1. No acute intracranial CT findings or interval changes. Stable exam. 2. 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 3. Enlarged pulmonary trunk indicating arterial hypertension. No arterial embolus is seen. 4. Right upper lobe patchy airspace disease consistent with multifocal pneumonia. 5. New patchy ground-glass disease in the right lower lobe favored to be pneumonitis, versus asymmetric atelectasis. 6. New 5 mm rounded nodule in the posteromedial base of the right lower lobe, probably an inflammatory nodule. Follow-up study recommended after treatment of pneumonia. 7. Stable mediastinal and right hilar adenopathy. 8. Emphysema and bronchitis. 9. No acute findings in the abdomen or pelvis. 10. Chronic thickened folds in the stomach. 11. Diverticulosis without evidence of diverticulitis. 12. Pelvic floor laxity. 13. Osteopenia and degenerative change. Aortic Atherosclerosis (ICD10-I70.0) and Emphysema (ICD10-J43.9)      Disposition Plan  :    Status is: Inpatient   DVT Prophylaxis  :    SCDs Start: 02/28/24 9390 apixaban  (ELIQUIS ) tablet 5 mg     Lab Results  Component Value Date   PLT 167 03/02/2024    Diet :  Diet Order             Diet Heart Room service appropriate? Yes; Fluid consistency: Nectar Thick  Diet effective now                    Inpatient Medications  Scheduled Meds:  ALPRAZolam   1 mg Oral BID   apixaban   5 mg Oral BID   atenolol   25 mg Oral  Daily   budesonide -glycopyrrolate -formoterol   2 puff Inhalation BID   doxycycline   100 mg Oral Q12H   ezetimibe   10 mg Oral QHS   fenofibrate   160 mg Oral Daily   guaiFENesin   600 mg Oral BID   pantoprazole   40 mg Oral Daily   pregabalin   50 mg Oral BID   sodium chloride  flush  3 mL Intravenous Q12H   Continuous Infusions:  cefTRIAXone  (ROCEPHIN )  IV 1 g (03/01/24 0824)   PRN Meds:.acetaminophen  **OR** acetaminophen , cyclobenzaprine , levalbuterol , melatonin, ondansetron  (ZOFRAN ) IV, oxyCODONE -acetaminophen , trimethobenzamide      Objective:   Vitals:   03/02/24 0000 03/02/24 0200 03/02/24 0400 03/02/24 0459  BP: (!) 118/54  127/61   Pulse: 61 60 60   Resp: 10 12 15    Temp: 97.6 F (36.4 C)  97.8 F (36.6 C)   TempSrc: Oral  Axillary   SpO2: 95% 92% 94%   Weight:    74.1 kg  Height:        Wt Readings from Last 3 Encounters:  03/02/24 74.1 kg  02/15/24 70.1 kg  09/30/23 71.9 kg    No intake or output data in the 24 hours ending 03/02/24 0734    Physical Exam  Awake Alert, No new F.N deficits, Normal affect [place].AT,PERRAL Supple Neck, No JVD,   Symmetrical Chest wall movement, Good air movement bilaterally, few fine crackles RRR,No Gallops,Rubs or new Murmurs,  +ve B.Sounds, Abd Soft, No tenderness,   No Cyanosis, Clubbing or edema     Data Review:    Recent Labs  Lab 02/28/24 0228 02/29/24 0518 03/02/24 0518  WBC 12.4* 5.0 4.7  HGB 13.4 10.5* 10.2*  HCT 41.6 32.8* 32.5*  PLT 224 162 167  MCV 91.2 92.4 93.1  MCH 29.4 29.6 29.2  MCHC 32.2 32.0 31.4  RDW 14.2 14.4 14.3  LYMPHSABS 0.7  --  0.8  MONOABS 0.6  --  0.4  EOSABS 0.2  --  0.2  BASOSABS 0.1  --  0.0    Recent Labs  Lab 02/28/24 0228 02/28/24 0256 02/28/24 0258 02/28/24 0453 02/28/24 0526 02/29/24 0518 02/29/24 0544 02/29/24 0916 03/02/24 0518  NA 142  --   --   --   --  139  --   --  138  K 4.0  --   --   --   --  4.1  --   --  4.6  CL 100  --   --   --   --  103  --   --  104   CO2 28  --   --   --   --  27  --   --  28  ANIONGAP 14  --   --   --   --  9  --   --  6  GLUCOSE 129*  --   --   --   --  110*  --   --  99  BUN 27*  --   --   --   --  20  --   --  19  CREATININE 1.36*  --   --   --   --  1.08*  --   --  1.04*  AST 35  --   --   --   --   --   --   --   --  ALT 12  --   --   --   --   --   --   --   --   ALKPHOS 40  --   --   --   --   --   --   --   --   BILITOT 0.8  --   --   --   --   --   --   --   --   ALBUMIN 4.0  --   --   --   --   --   --   --   --   CRP  --   --   --   --   --   --   --  4.3*  --   PROCALCITON  --   --   --  <0.10  --  <0.10  --   --   --   LATICACIDVEN  --  1.9  --   --  1.9  --   --   --   --   INR 1.2  --   --   --   --   --   --   --   --   BNP  --   --  428.4*  --   --   --  230.3*  --   --   MG  --   --   --  1.5*  --  1.6*  --   --  2.0  PHOS  --   --   --  3.3  --   --   --   --   --   CALCIUM 9.5  --   --   --   --  8.6*  --   --  8.8*      Recent Labs  Lab 02/28/24 0228 02/28/24 0256 02/28/24 0258 02/28/24 0453 02/28/24 0526 02/29/24 0518 02/29/24 0544 02/29/24 0916 03/02/24 0518  CRP  --   --   --   --   --   --   --  4.3*  --   PROCALCITON  --   --   --  <0.10  --  <0.10  --   --   --   LATICACIDVEN  --  1.9  --   --  1.9  --   --   --   --   INR 1.2  --   --   --   --   --   --   --   --   BNP  --   --  428.4*  --   --   --  230.3*  --   --   MG  --   --   --  1.5*  --  1.6*  --   --  2.0  CALCIUM 9.5  --   --   --   --  8.6*  --   --  8.8*    --------------------------------------------------------------------------------------------------------------- No results found for: CHOL, HDL, LDLCALC, LDLDIRECT, TRIG, CHOLHDL  No results found for: HGBA1C No results for input(s): TSH, T4TOTAL, FREET4, T3FREE, THYROIDAB in the last 72 hours. No results for input(s): VITAMINB12, FOLATE, FERRITIN, TIBC, IRON, RETICCTPCT in the last 72  hours. ------------------------------------------------------------------------------------------------------------------ Cardiac Enzymes No results for input(s): CKMB, TROPONINI, MYOGLOBIN in the last 168 hours.  Invalid input(s): CK  Micro Results Recent Results (from the past 240 hours)  Blood Culture (routine x 2)  Status: None (Preliminary result)   Collection Time: 02/28/24  2:28 AM   Specimen: BLOOD  Result Value Ref Range Status   Specimen Description BLOOD LEFT ANTECUBITAL  Final   Special Requests   Final    BOTTLES DRAWN AEROBIC AND ANAEROBIC Blood Culture results may not be optimal due to an inadequate volume of blood received in culture bottles   Culture   Final    NO GROWTH 3 DAYS Performed at [facility], [address] [place], [place] [address]    Report Status PENDING  Incomplete  Resp panel by RT-PCR (RSV, Flu A&B, Covid) Anterior Nasal Swab     Status: None   Collection Time: 02/28/24  2:31 AM   Specimen: Anterior Nasal Swab  Result Value Ref Range Status   SARS Coronavirus 2 by RT PCR NEGATIVE NEGATIVE Final   Influenza A by PCR NEGATIVE NEGATIVE Final   Influenza B by PCR NEGATIVE NEGATIVE Final    Comment: (NOTE) The Xpert Xpress SARS-CoV-2/FLU/RSV plus assay is intended as an aid in the diagnosis of influenza from Nasopharyngeal swab specimens and should not be used as a sole basis for treatment. Nasal washings and aspirates are unacceptable for Xpert Xpress SARS-CoV-2/FLU/RSV testing.  Fact Sheet for Patients: BloggerCourse.com  Fact Sheet for Healthcare Providers: SeriousBroker.it  This test is not yet approved or cleared by the [place]  FDA and has been authorized for detection and/or diagnosis of SARS-CoV-2 by FDA under an Emergency Use Authorization (EUA). This EUA will remain in effect (meaning this test can be used) for the duration of the COVID-19 declaration under  Section 564(b)(1) of the Act, 21 U.S.C. section 360bbb-3(b)(1), unless the authorization is terminated or revoked.     Resp Syncytial Virus by PCR NEGATIVE NEGATIVE Final    Comment: (NOTE) Fact Sheet for Patients: BloggerCourse.com  Fact Sheet for Healthcare Providers: SeriousBroker.it  This test is not yet approved or cleared by the [place]  FDA and has been authorized for detection and/or diagnosis of SARS-CoV-2 by FDA under an Emergency Use Authorization (EUA). This EUA will remain in effect (meaning this test can be used) for the duration of the COVID-19 declaration under Section 564(b)(1) of the Act, 21 U.S.C. section 360bbb-3(b)(1), unless the authorization is terminated or revoked.  Performed at [facility], [address] [place], [place] [address]   Blood Culture (routine x 2)     Status: None (Preliminary result)   Collection Time: 02/28/24  2:33 AM   Specimen: BLOOD  Result Value Ref Range Status   Specimen Description BLOOD BLOOD RIGHT ARM  Final   Special Requests   Final    BOTTLES DRAWN AEROBIC ONLY Blood Culture results may not be optimal due to an inadequate volume of blood received in culture bottles   Culture   Final    NO GROWTH 3 DAYS Performed at [facility], [address], [place], [place] [address]    Report Status PENDING  Incomplete  MRSA Next Gen by PCR, Nasal     Status: None   Collection Time: 02/29/24  5:46 AM   Specimen: Nasal Mucosa; Nasal Swab  Result Value Ref Range Status   MRSA by PCR Next Gen NOT DETECTED NOT DETECTED Final    Comment: (NOTE) The GeneXpert MRSA Assay (FDA approved for NASAL specimens only), is one component of a comprehensive MRSA colonization surveillance program. It is not intended to diagnose MRSA infection nor to guide or monitor treatment for  MRSA infections. Test performance is not FDA approved in patients less than 30 years old. Performed  at [facility], [address], [place], [place] [address]     Radiology Report DG Swallowing Func-Speech Pathology Result Date: 03/01/2024 Table formatting from the original result was not included. Modified Barium Swallow Study Patient Details Name: DYNASTEE BRUMMELL MRN: [id number] Date of Birth: 1940-08-13 Today's Date: 03/01/2024 HPI/PMH: HPI: LIZZY HAMRE is an 83 yo female presenting to ED 8/11 with back pain and shortness of breath. Admitted with sepsis secondary to PNA. CTA shows RUL patchy airspace disease consistent with multifocal PNA and new patchy ground-glass disease in the RLL. Recently hospitalized 7/28-7/29 for SIRS with question of UTI vs upper respiratory infection.  SLP consulted due to recurrent PNA. PMH includes COPD, chronic respiratory failure on 4L, HFpEF, CAD, tachybradycardia syndrome s/p permanent pacemaker, Crohn's disease, SBO, migraine, CKD 3, anxiety Clinical Impression: Clinical Impression: Pt exhibits moderate pharyngeal dysphagia characterized primarily by mistiming and reduced sensation. The swallow is initiated at the pyriform sinuses and epiglottic inversion is incomplete, allowing airway invasion before and during the swallow. This was seen consistently with penetration above the vocal folds until she was challenged with larger sips, which resulted in aspiration with variable sensation (PAS 7, 8). A chin tuck posture did not improve airway protection. Nectar thick liquids result in penetration above the vocal folds that cued intermittent coughing clears (PAS 3). Additionally, retention of barium was noted when the 13 mm tablet was administered with thin liquids. This may contribute to the symptoms of globus and regurgitation that the pt described to this SLP during evalutation previous date (could consider further esophageal assessment). For now, recommend continuing regular diet but with nectar thick liquids pending ongoing education and discussion with the  pt and medical team. Prolonged use of thickener is not recommended nor does it prevent aspiration or decrease risk of adverse events that may occur in its presence. Will plan to f/u with MD and pt as able. Factors that may increase risk of adverse event in presence of aspiration Noe & Lianne 2021): Factors that may increase risk of adverse event in presence of aspiration Noe & Lianne 2021): Poor general health and/or compromised immunity; Respiratory or GI disease; Reduced cognitive function; Frail or deconditioned; Inadequate oral hygiene Recommendations/Plan: Swallowing Evaluation Recommendations Swallowing Evaluation Recommendations Recommendations: PO diet PO Diet Recommendation: Regular; Mildly thick liquids (Level 2, nectar thick) Liquid Administration via: Cup; Straw Medication Administration: Whole meds with puree Supervision: Patient able to self-feed; Intermittent supervision/cueing for swallowing strategies Swallowing strategies  : Minimize environmental distractions; Slow rate; Small bites/sips Postural changes: Position pt fully upright for meals; Stay upright 30-60 min after meals Oral care recommendations: Oral care QID (4x/day); Oral care before PO Recommended consults: Consider Palliative care Treatment Plan Treatment Plan Treatment recommendations: Therapy as outlined in treatment plan below Follow-up recommendations: Outpatient SLP Functional status assessment: Patient has had a recent decline in their functional status and demonstrates the ability to make significant improvements in function in a reasonable and predictable amount of time. Treatment frequency: Min 2x/week Treatment duration: 2 weeks Interventions: Aspiration precaution training; Patient/family education; Trials of upgraded texture/liquids; Diet toleration management by SLP Recommendations Recommendations for follow up therapy are one component of a multi-disciplinary discharge planning process, led by the attending  physician.  Recommendations may be updated based on patient status, additional functional criteria and insurance authorization. Assessment: Orofacial Exam: Orofacial Exam Oral Cavity: Oral Hygiene: WFL Oral Cavity -  Dentition: Missing dentition; Poor condition Orofacial Anatomy: WFL Oral Motor/Sensory Function: WFL Anatomy: Anatomy: Suspected cervical osteophytes Boluses Administered: Boluses Administered Boluses Administered: Thin liquids (Level 0); Mildly thick liquids (Level 2, nectar thick); Moderately thick liquids (Level 3, honey thick); Puree; Solid  Oral Impairment Domain: Oral Impairment Domain Lip Closure: No labial escape Tongue control during bolus hold: Cohesive bolus between tongue to palatal seal Bolus preparation/mastication: Timely and efficient chewing and mashing Bolus transport/lingual motion: Brisk tongue motion Oral residue: Complete oral clearance Location of oral residue : N/A Initiation of pharyngeal swallow : Pyriform sinuses  Pharyngeal Impairment Domain: Pharyngeal Impairment Domain Soft palate elevation: No bolus between soft palate (SP)/pharyngeal wall (PW) Laryngeal elevation: Complete superior movement of thyroid  cartilage with complete approximation of arytenoids to epiglottic petiole Anterior hyoid excursion: Complete anterior movement Epiglottic movement: Partial inversion Laryngeal vestibule closure: Incomplete, narrow column air/contrast in laryngeal vestibule Pharyngeal stripping wave : Present - complete Pharyngeal contraction (A/P view only): N/A Pharyngoesophageal segment opening: Complete distension and complete duration, no obstruction of flow Tongue base retraction: No contrast between tongue base and posterior pharyngeal wall (PPW) Pharyngeal residue: Complete pharyngeal clearance Location of pharyngeal residue: N/A  Esophageal Impairment Domain: Esophageal Impairment Domain Esophageal clearance upright position: Esophageal retention Pill: Pill Consistency administered:  Thin liquids (Level 0) Thin liquids (Level 0): Impaired (see clinical impressions) Penetration/Aspiration Scale Score: Penetration/Aspiration Scale Score 1.  Material does not enter airway: Solid; Pill 2.  Material enters airway, remains ABOVE vocal cords then ejected out: Moderately thick liquids (Level 3, honey thick); Puree 3.  Material enters airway, remains ABOVE vocal cords and not ejected out: Mildly thick liquids (Level 2, nectar thick) Compensatory Strategies: Compensatory Strategies Compensatory strategies: Yes Chin tuck: Ineffective Ineffective Chin Tuck: Thin liquid (Level 0)   General Information: Caregiver present: No  Diet Prior to this Study: Regular; Thin liquids (Level 0)   Temperature : Normal   Respiratory Status: WFL   Supplemental O2: Nasal cannula   History of Recent Intubation: No  Behavior/Cognition: Alert; Cooperative Self-Feeding Abilities: Able to self-feed Baseline vocal quality/speech: Normal Volitional Cough: Able to elicit Volitional Swallow: Able to elicit Exam Limitations: No limitations Goal Planning: Prognosis for improved oropharyngeal function: Fair Barriers to Reach Goals: Time post onset No data recorded Patient/Family Stated Goal: none stated Consulted and agree with results and recommendations: Patient; Physician Pain: Pain Assessment Pain Assessment: No/denies pain End of Session: Start Time:SLP Start Time (ACUTE ONLY): 1505 Stop Time: SLP Stop Time (ACUTE ONLY): 1524 Time Calculation:SLP Time Calculation (min) (ACUTE ONLY): 19 min Charges: SLP Evaluations $ SLP Speech Visit: 1 Visit SLP Evaluations $BSS Swallow: 1 Procedure $MBS Swallow: 1 Procedure $Swallowing Treatment: 1 Procedure SLP visit diagnosis: SLP Visit Diagnosis: Dysphagia, pharyngoesophageal phase (R13.14) Past Medical History: Past Medical History: Diagnosis Date  Acute CHF (congestive heart failure) (HCC) 01/02/2022  AKI (acute kidney injury) (HCC) 09/08/2020  Aspiration pneumonia (HCC) 01/05/2019  Atypical  chest pain 03/11/2022  Bradycardia 03/14/2020  C. difficile colitis 04/03/2021  CHF (congestive heart failure) (HCC)   COPD (chronic obstructive pulmonary disease) (HCC)   Coronary artery disease   Tachycardia 05/31/2013 Past Surgical History: Past Surgical History: Procedure Laterality Date  ABDOMINAL HYSTERECTOMY    BOWEL RESECTION    CHOLECYSTECTOMY   Damien Blumenthal, M.A., CCC-SLP Speech Language Pathology, Acute Rehabilitation Services Secure Chat preferred [phone] 03/01/2024, 4:37 PM    Signature  -   Lavada Stank M.D on 03/02/2024 at 7:34 AM   -  To page go to [email or web address]

## 2024-03-02 NOTE — Progress Notes (Signed)
 0500 Pt in no apparent distress, resting well, dual PPM functioning well, pt assisted to bathroom by RN to void, pt using walker easily.  Pt back to bed, will monitor.

## 2024-03-02 NOTE — Significant Event (Signed)
 Overnight Coverage Note  Rapid response for hypoxia, AMS  83 yo with COPD on 4 L, HFpEF, CAD, tachybrady s/p pacemaker, crohn's etc here with acute on chronic resp failure due to aspiration pneumonia.   Per RN was doing ok, then got anxious, then progressive SOB and AMS.  Sats in 70's on NRB prior to being placed on bipap.    Today's Vitals   03/02/24 1600 03/02/24 1900 03/02/24 1916 03/02/24 2214  BP:    (!) 190/93  Pulse:   65 (!) 102  Resp: (!) 23  18 (!) 23  Temp: (!) 97.5 F (36.4 C)     TempSrc: Oral     SpO2:  95% 95% (!) 88%  Weight:      Height:      PainSc:       Body mass index is 28.04 kg/m.  Seen at bedside, she nods her head when say her name, but doesn't say anything or do much else. Ill appearing on bipap Coarse breath sounds, poor air movement RRR, Not particularly edematous, doesn't appear grossly overloaded  Iman Orourke/P  With her AMS and resp failure requiring bipap will follow CXR, VBG, EKG, troponins. CXR Vasili Fok few days ago concerning for mild CHF, will give lasix .  Continue nebs.  Has steroid intolerance (agitation, hallucinations, increased BP/HR), but with tight breath sounds and need for bipap, will give Verbena Boeding dose (20 mg).  PCCM c/s with resp failure requiring bipap and AMS.  Discussed with Dr. Layman.

## 2024-03-03 ENCOUNTER — Inpatient Hospital Stay (HOSPITAL_COMMUNITY)

## 2024-03-03 DIAGNOSIS — I5033 Acute on chronic diastolic (congestive) heart failure: Secondary | ICD-10-CM

## 2024-03-03 DIAGNOSIS — A419 Sepsis, unspecified organism: Secondary | ICD-10-CM | POA: Diagnosis not present

## 2024-03-03 DIAGNOSIS — J189 Pneumonia, unspecified organism: Secondary | ICD-10-CM | POA: Diagnosis not present

## 2024-03-03 DIAGNOSIS — J9622 Acute and chronic respiratory failure with hypercapnia: Secondary | ICD-10-CM

## 2024-03-03 LAB — RESPIRATORY PANEL BY PCR

## 2024-03-03 LAB — BASIC METABOLIC PANEL WITH GFR
Anion gap: 10 (ref 5–15)
BUN: 30 mg/dL — ABNORMAL HIGH (ref 8–23)
CO2: 24 mmol/L (ref 22–32)
Calcium: 9.3 mg/dL (ref 8.9–10.3)
Chloride: 105 mmol/L (ref 98–111)
Creatinine, Ser: 1.24 mg/dL — ABNORMAL HIGH (ref 0.44–1.00)
GFR, Estimated: 44 mL/min — ABNORMAL LOW (ref >60)
Glucose, Bld: 132 mg/dL — ABNORMAL HIGH (ref 70–99)
Potassium: 4.5 mmol/L (ref 3.5–5.1)
Sodium: 139 mmol/L (ref 135–145)

## 2024-03-03 LAB — CBC WITH DIFFERENTIAL/PLATELET
Abs Immature Granulocytes: 0.03 K/uL (ref 0.00–0.07)
Basophils Absolute: 0 K/uL (ref 0.0–0.1)
Basophils Relative: 0 %
Eosinophils Absolute: 0 K/uL (ref 0.0–0.5)
Eosinophils Relative: 0 %
HCT: 39.8 % (ref 36.0–46.0)
Hemoglobin: 12.6 g/dL (ref 12.0–15.0)
Immature Granulocytes: 1 %
Lymphocytes Relative: 6 %
Lymphs Abs: 0.3 K/uL — ABNORMAL LOW (ref 0.7–4.0)
MCH: 29.1 pg (ref 26.0–34.0)
MCHC: 31.7 g/dL (ref 30.0–36.0)
MCV: 91.9 fL (ref 80.0–100.0)
Monocytes Absolute: 0.1 K/uL (ref 0.1–1.0)
Monocytes Relative: 2 %
Neutro Abs: 4.5 K/uL (ref 1.7–7.7)
Neutrophils Relative %: 91 %
Platelets: 209 K/uL (ref 150–400)
RBC: 4.33 MIL/uL (ref 3.87–5.11)
RDW: 14.3 % (ref 11.5–15.5)
WBC: 4.9 K/uL (ref 4.0–10.5)
nRBC: 0 % (ref 0.0–0.2)

## 2024-03-03 LAB — TROPONIN I (HIGH SENSITIVITY)
Troponin I (High Sensitivity): 165 ng/L (ref <18)
Troponin I (High Sensitivity): 145 ng/L (ref <18)

## 2024-03-03 LAB — BRAIN NATRIURETIC PEPTIDE: B Natriuretic Peptide: 1045.8 pg/mL — ABNORMAL HIGH (ref 0.0–100.0)

## 2024-03-03 LAB — GLUCOSE, CAPILLARY
Glucose-Capillary: 134 mg/dL — ABNORMAL HIGH (ref 70–99)
Glucose-Capillary: 119 mg/dL — ABNORMAL HIGH (ref 70–99)
Glucose-Capillary: 116 mg/dL — ABNORMAL HIGH (ref 70–99)
Glucose-Capillary: 130 mg/dL — ABNORMAL HIGH (ref 70–99)

## 2024-03-03 LAB — MAGNESIUM: Magnesium: 1.9 mg/dL (ref 1.7–2.4)

## 2024-03-03 LAB — C-REACTIVE PROTEIN: CRP: 0.6 mg/dL (ref <1.0)

## 2024-03-03 LAB — PROCALCITONIN: Procalcitonin: <0.1 ng/mL

## 2024-03-03 MED ORDER — FUROSEMIDE 10 MG/ML IJ SOLN
40.0000 mg | Freq: Every day | INTRAMUSCULAR | Status: DC
  Administered 2024-03-03 – 2024-03-04 (×2): 40 mg via INTRAVENOUS
  Filled 2024-03-03 (×2): qty 4

## 2024-03-03 MED ORDER — IPRATROPIUM-ALBUTEROL 0.5-2.5 (3) MG/3ML IN SOLN: 3.0000 mL | Freq: Four times a day (QID) | RESPIRATORY_TRACT | Status: DC | PRN

## 2024-03-03 NOTE — Progress Notes (Signed)
 NAME:  Brittany Irwin, MRN:  [id number], DOB:  [birth year], LOS: 4 ADMISSION DATE:  02/28/2024, CONSULTATION DATE:  03/02/2024 REFERRING MD:  Dr. Dennise, CHIEF COMPLAINT:  respiratory distress   History of Present Illness:  83 y/o F with a PMH significant for COPD, chronic hypoxic respiratory failure on 4 L O2 by [place], HFpEF, CAD, Crohn's disease, and CKD stage III who presents for shortness of breath. CC consulted last night for respiratory distress, hypoxia and placed on BiPAP overnight.  Pertinent  Medical History  As above  Significant Hospital Events: Including procedures, antibiotic start and stop dates in addition to other pertinent events   Placed on BiPAP overnight Given diuretics and per patient peed a lot Given nebulizer and steroids  Interim History / Subjective:  Patient reports feeling markedly improved. She notes that she peed a lot. Orthopnea is better. Bloating in belly better. Weight has come down. Patient notes that she has gained 8 lbs in the last week. She does take Bumex  at home. RT came in during my visit and switched her to [facility].  Objective    Blood pressure (!) 115/59, pulse 60, temperature 98.5 F (36.9 C), temperature source Oral, resp. rate 16, height 5' 4 (1.626 m), weight 74.1 kg, SpO2 90%.    Vent Mode: PCV;BIPAP FiO2 (%):  [40 %-60 %] 40 % Set Rate:  [15 bmp] 15 bmp PEEP:  [5 cmH20-8 cmH20] 5 cmH20 Pressure Support:  [5 cmH20-12 cmH20] 5 cmH20  No intake or output data in the 24 hours ending 03/03/24 1303 Filed Weights   02/29/24 0328 03/01/24 0358 03/02/24 0459  Weight: 77.2 kg 74.8 kg 74.1 kg    Examination: General: well appearing, no distress HENT: PERRL, normal oral and nasal mucosa Lungs: CTAB, no wheezing or rhonchi Cardiovascular: irregular rhythm, no murmurs, JVD 13 cm H2O Abdomen: distended, slight edema in abdomen, no tenderness Extremities: trace edema Neuro: alert, oriented x 3 GU: deferred.  Resolved problem list   Assessment  and Plan   #Acute on Chronic Hypoxic and Hypercapnic Respiratory failure: #Hx of COPD/Emphysema #Acute on chronic diastolic heart failure - Agree with continuation of diuresis - Continue ICS/LAMA/LABA - Continue Duonebs QID - Consider starting nightly BiPAP and doing [place] during the day - Agree with echocardiogram - She will need pulmonology follow up as an outpatient for formal PFTs and possible evaluation for sleep apnea.  The patient does not need transfer to ICU given clinical improvement with interventions above. The critical care team will sign off.  Labs   CBC: Recent Labs  Lab 02/28/24 0228 02/29/24 0518 03/02/24 0518 03/03/24 0831  WBC 12.4* 5.0 4.7 4.9  NEUTROABS 10.8*  --  3.3 4.5  HGB 13.4 10.5* 10.2* 12.6  HCT 41.6 32.8* 32.5* 39.8  MCV 91.2 92.4 93.1 91.9  PLT 224 162 167 209    Basic Metabolic Panel: Recent Labs  Lab 02/28/24 0228 02/28/24 0453 02/29/24 0518 03/02/24 0518 03/03/24 0831  NA 142  --  139 138 139  K 4.0  --  4.1 4.6 4.5  CL 100  --  103 104 105  CO2 28  --  27 28 24   GLUCOSE 129*  --  110* 99 132*  BUN 27*  --  20 19 30*  CREATININE 1.36*  --  1.08* 1.04* 1.24*  CALCIUM 9.5  --  8.6* 8.8* 9.3  MG  --  1.5* 1.6* 2.0 1.9  PHOS  --  3.3  --   --   --  GFR: Estimated Creatinine Clearance: 35.1 mL/min (A) (by C-G formula based on SCr of 1.24 mg/dL (H)). Recent Labs  Lab 02/28/24 0228 02/28/24 0256 02/28/24 0453 02/28/24 0526 02/29/24 0518 03/02/24 0518 03/03/24 0831  PROCALCITON  --   --  <0.10  --  <0.10  --  <0.10  WBC 12.4*  --   --   --  5.0 4.7 4.9  LATICACIDVEN  --  1.9  --  1.9  --   --   --     Liver Function Tests: Recent Labs  Lab 02/28/24 0228  AST 35  ALT 12  ALKPHOS 40  BILITOT 0.8  PROT 7.7  ALBUMIN 4.0   Recent Labs  Lab 02/28/24 0228  LIPASE 39   No results for input(s): AMMONIA in the last 168 hours.  ABG    Component Value Date/Time   PHART 7.29 (L) 03/02/2024 2300   PCO2ART 56 (H)  03/02/2024 2300   PO2ART 65 (L) 03/02/2024 2300   HCO3 27.3 03/02/2024 2300   TCO2 29 10/14/2022 1122   ACIDBASEDEF 0.6 03/02/2024 2300   O2SAT 93.7 03/02/2024 2300     Coagulation Profile: Recent Labs  Lab 02/28/24 0228  INR 1.2    Cardiac Enzymes: No results for input(s): CKTOTAL, CKMB, CKMBINDEX, TROPONINI in the last 168 hours.  HbA1C: No results found for: HGBA1C  CBG: Recent Labs  Lab 03/03/24 0750 03/03/24 1159  GLUCAP 134* 119*    Review of Systems:   N/A  Past Medical History:  She,  has a past medical history of Acute CHF (congestive heart failure) (HCC) (01/02/2022), AKI (acute kidney injury) (HCC) (09/08/2020), Aspiration pneumonia (HCC) (01/05/2019), Atypical chest pain (03/11/2022), Bradycardia (03/14/2020), C. difficile colitis (04/03/2021), CHF (congestive heart failure) (HCC), COPD (chronic obstructive pulmonary disease) (HCC), Coronary artery disease, and Tachycardia (05/31/2013).   Surgical History:   Past Surgical History:  Procedure Laterality Date   ABDOMINAL HYSTERECTOMY     BOWEL RESECTION     CHOLECYSTECTOMY       Social History:   reports that she has quit smoking. Her smoking use included cigarettes. She does not have any smokeless tobacco history on file. She reports that she does not currently use alcohol . She reports that she does not use drugs.   Family History:  Her family history is not on file.   Allergies Allergies  Allergen Reactions   Methylprednisolone  Other (See Comments)    Increased BP, HR, agitation, hallucinations    Shellfish Allergy Nausea And Vomiting    All SEAFOOD   Ativan  [Lorazepam ] Other (See Comments)    Psychosis   Azulfidine [Sulfasalazine] Other (See Comments)    Headache    Epipen [Epinephrine] Hypertension   Flagyl [Metronidazole] Hives   Motrin [Ibuprofen] Nausea Only and Other (See Comments)    Told not to take medication due to GI distress caused by crohn's disease.   Nsaids Other  (See Comments)    Told not to take NSAIDs due to GI distress caused by crohn's disease   Penicillins Hives    12/2021, 11/2023 tolerated cephalosporins    Morphine And Codeine  Itching    Told not to take due to GI distress caused by crohn's disease     Home Medications  Prior to Admission medications   Medication Sig Start Date End Date Taking? Authorizing Provider  acetaminophen -codeine  (TYLENOL  #4) 300-60 MG tablet Take 1 tablet by mouth every 6 (six) hours as needed for pain. 12/31/21  Yes [provider]  ALPRAZolam  (  XANAX ) 1 MG tablet Take 1 mg by mouth 2 (two) times daily. 01/02/22  Yes [provider]  apixaban  (ELIQUIS ) 5 MG TABS tablet Take 5 mg by mouth 2 (two) times daily.   Yes [provider]  atenolol  (TENORMIN ) 25 MG tablet Take 1 tablet (25 mg total) by mouth daily. 02/16/24  Yes Sheikh, Omair Latif, DO  bumetanide  (BUMEX ) 1 MG tablet Take 1 tablet (1 mg total) by mouth daily as needed. 02/15/24  Yes Sheikh, Omair Latif, DO  cyanocobalamin (,VITAMIN B-12,) 1000 MCG/ML injection Inject 1,000 mcg into the muscle every 30 (thirty) days.   Yes [provider]  cyclobenzaprine  (FLEXERIL ) 5 MG tablet Take 5 mg by mouth daily as needed for muscle spasms. 10/13/22  Yes [provider]  diclofenac  Sodium (VOLTAREN  ARTHRITIS PAIN) 1 % GEL Apply 1 Application topically in the morning and at bedtime.   Yes [provider]  ergocalciferol (VITAMIN D2) 1.25 MG (50000 UT) capsule Take 50,000 Units by mouth once a week. Every thursday   Yes [provider]  ezetimibe  (ZETIA ) 10 MG tablet Take 10 mg by mouth at bedtime. 12/31/21  Yes [provider]  fenofibrate  micronized (LOFIBRA) 200 MG capsule Take 200 mg by mouth daily. 12/31/21  Yes [provider]  fluconazole  (DIFLUCAN ) 150 MG tablet Take 150 mg by mouth daily.   Yes [provider]  Fluticasone -Umeclidin-Vilant (TRELEGY ELLIPTA ) 200-62.5-25 MCG/ACT AEPB  Inhale 1 puff into the lungs daily. 12/22/23  Yes   levalbuterol  (XOPENEX ) 0.63 MG/3ML nebulizer solution Take 3 mLs (0.63 mg total) by nebulization every 4 (four) hours as needed for wheezing or shortness of breath. 09/11/23 02/28/24 Yes Kc, Mennie, MD  loratadine  (CLARITIN ) 10 MG tablet Take 1 tablet (10 mg total) by mouth daily for 14 days. Patient taking differently: Take 10 mg by mouth daily as needed for allergies. 09/12/23 02/28/24 Yes Christobal Mennie, MD  Menthol, Topical Analgesic, (ICY HOT BACK EX) Apply 1 Application topically 2 (two) times daily as needed (back pain).   Yes [provider]  nitroGLYCERIN (NITROSTAT) 0.4 MG SL tablet Place 0.4 mg under the tongue every 5 (five) minutes x 3 doses as needed for chest pain. 12/31/21  Yes [provider]  omeprazole (PRILOSEC) 40 MG capsule Take 40 mg by mouth daily.   Yes [provider]  pregabalin  (LYRICA ) 50 MG capsule Take 50 mg by mouth 2 (two) times daily.   Yes [provider]  ondansetron  (ZOFRAN ) 4 MG tablet Take 1 tablet (4 mg total) by mouth every 6 (six) hours as needed for nausea. Patient not taking: Reported on 02/28/2024 02/15/24   Sherrill Alejandro Donovan, DO     Thank you for this interesting consult. I have spent 50 minutes evaluating patient, reviewing chart, and discussing plan of care with patient, family, and primary medical team. If you have any questions or concerns please reach out to me via pager ([phone]).  Paula Southerly, MD [facility]

## 2024-03-03 NOTE — Plan of Care (Signed)

## 2024-03-03 NOTE — Progress Notes (Signed)
 PROGRESS NOTE                                                                                                                                                                                                             Patient Demographics:    Brittany Irwin, is a 83 y.o. female, DOB - 1940/09/19, FMW:[id number]  Outpatient Primary MD for the patient is [name], MD    LOS - 4  Admit date - 02/28/2024    Chief Complaint  Patient presents with   Back Pain   Shortness of Breath       Brief Narrative (HPI from H&P)   83 y.o. female with medical history significant of COPD, chronic respiratory failure on 4 L, HfpEF, CAD, tachybradycardia syndrome s/p permanent pacemaker, Crohn's disease, SBO, psoriasis, migraine headaches, CKD stage III, and anxiety presents with complaints of back pain and shortness of breath. She is accompanied by her sister.   Records note she had just recently been hospitalized from 7/28 -7/29 for SIRS with question of a urinary tract infection versus possible upper respiratory infection, but thought likely secondary to the patient being acutely dehydrated.  Experiences a persistent cough, sometimes productive, along with episodes of shaking chills and feeling extremely cold, similar to previous pneumonia episodes.   She recently started on Eliquis  for atrial fibrillation, identified during a pacemaker check. She takes two tablets per day, one in the morning and one in the evening.  She presented to the hospital with shortness of breath diagnosed with acute hypoxic respiratory failure due to pneumonia.   Subjective:   Patient in bed, appears comfortable, denies any headache, no fever, no chest pain or pressure, improving shortness of breath , no abdominal pain. No focal weakness.  Assessment  & Plan :    Acute on chronic respiratory failure with hypoxia - Sepsis secondary to aspiration pneumonia -  POA.  Uses 4 L nasal cannula oxygen at baseline. Patient presented with fever up to 101.2 F with leukocytosis.  Currently requiring 6 L of nasal cannula oxygen maintain O2 saturations greater than 90%.  CTA of the chest noted concerns for a multifocal right-sided pneumonia.  She has been placed on empiric antibiotics which are Rocephin  and doxycycline , follow cultures, encouraged to sit in chair use ice and flutter valve for pulmonary toiletry, per history likely has  underlying dysphagia, confirmed by speech therapy via MBS, on nectar thick liquids, patient counseled, remains at risk for continued aspiration, will get palliative care input as well.   COPD Patient without significant wheezes or rhonchi appreciated on physical exam,  Continue home inhaler,  Levalbuterol  as needed for shortness of breath/wheezing   Elevated troponin  Acute.  Patient does not report having any specific chest pain.  High-sensitivity troponin 188-> 175.  EKG similar to prior.  Question if secondary to demand. Continue to monitor   CKD 3 A- creatinine usually between 1.1 and 1.4 in the outpatient setting, monitor closely.  Back pain Acute on chronic.  Patient reports having acute worsening of her back pain.  Denies having any significant injury to onset symptoms.  Home medication regimen includes Tylenol  4.  Oxycodone  as needed for pain   Acute on chronic diastolic CHF night of 03/02/2024.  EF 65%, repeat echocardiogram, trend troponin which so far appears flat and in non-ACS pattern, no chest pain, EKG nonacute, started on IV Lasix , required BiPAP at night of 03/02/2024, continue to monitor closely., resume diuretics, monitor dose closely.   Paroxysmal atrial fibrillation, tachybradycardia syndrome S/p PPM  Patient had a [employer] pacemaker placed back in 02/2023.  Followed by Dr. Luke of cardiology at [facility].  On Eliquis  chronically.   History of Crohn's disease   Patient does not report having any significant episodes  of diarrhea or blood in stools.   Anxiety  Chronic.   Continue Xanax  as needed   Neuropathy  - Continue Lyrica  and gabapentin .   Hypomagnesemia.  Replaced.    Hyperlipidemia  - Continue fenofibrate  and Zetia    GERD   - Continue PPI         Condition - Fair  Family Communication  : Darin Handing [phone]  on 02/29/2024 at 8:16 AM  Code Status :  Full  Consults  :  None  PUD Prophylaxis :  PPI   Procedures  :     CT head, CTA chest abdomen pelvis.  1. No acute intracranial CT findings or interval changes. Stable exam. 2. 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 3. Enlarged pulmonary trunk indicating arterial hypertension. No arterial embolus is seen. 4. Right upper lobe patchy airspace disease consistent with multifocal pneumonia. 5. New patchy ground-glass disease in the right lower lobe favored to be pneumonitis, versus asymmetric atelectasis. 6. New 5 mm rounded nodule in the posteromedial base of the right lower lobe, probably an inflammatory nodule. Follow-up study recommended after treatment of pneumonia. 7. Stable mediastinal and right hilar adenopathy. 8. Emphysema and bronchitis. 9. No acute findings in the abdomen or pelvis. 10. Chronic thickened folds in the stomach. 11. Diverticulosis without evidence of diverticulitis. 12. Pelvic floor laxity. 13. Osteopenia and degenerative change. Aortic Atherosclerosis (ICD10-I70.0) and Emphysema (ICD10-J43.9)      Disposition Plan  :    Status is: Inpatient   DVT Prophylaxis  :    SCDs Start: 02/28/24 0609 apixaban  (ELIQUIS ) tablet 5 mg     Lab Results  Component Value Date   PLT 209 03/03/2024    Diet :  Diet Order             Diet Heart Room service appropriate? Yes; Fluid consistency: Nectar Thick  Diet effective now                    Inpatient Medications  Scheduled Meds:  ALPRAZolam   1 mg Oral BID   apixaban   5 mg Oral BID   arformoterol   15 mcg Nebulization BID   atenolol    25 mg Oral Daily   budesonide  (PULMICORT ) nebulizer solution  0.5 mg Nebulization BID   Chlorhexidine  Gluconate Cloth  6 each Topical Daily   doxycycline   100 mg Oral Q12H   ezetimibe   10 mg Oral QHS   fenofibrate   160 mg Oral Daily   furosemide   40 mg Intravenous Daily   guaiFENesin   600 mg Oral BID   ipratropium-albuterol   3 mL Nebulization Q4H   methylPREDNISolone  (SOLU-MEDROL ) injection  80 mg Intravenous Q24H   pantoprazole   40 mg Oral Daily   pregabalin   50 mg Oral BID   revefenacin   175 mcg Nebulization Daily   sodium chloride  flush  3 mL Intravenous Q12H   Continuous Infusions:  cefTRIAXone  (ROCEPHIN )  IV Stopped (03/02/24 1328)   PRN Meds:.acetaminophen  **OR** acetaminophen , cyclobenzaprine , levalbuterol , melatonin, ondansetron  (ZOFRAN ) IV, oxyCODONE -acetaminophen , trimethobenzamide      Objective:   Vitals:   03/03/24 0345 03/03/24 0400 03/03/24 0718 03/03/24 0802  BP:  123/60 123/66   Pulse: 60 (!) 59  68  Resp: 15 14  (!) 23  Temp:  98.5 F (36.9 C) 98.2 F (36.8 C)   TempSrc:  Oral Axillary   SpO2: 100% 95%    Weight:      Height:        Wt Readings from Last 3 Encounters:  03/02/24 74.1 kg  02/15/24 70.1 kg  09/30/23 71.9 kg    No intake or output data in the 24 hours ending 03/03/24 0955    Physical Exam  Awake Alert, No new F.N deficits, Normal affect [place].AT,PERRAL Supple Neck, No JVD,   Symmetrical Chest wall movement, Good air movement bilaterally, few fine crackles RRR,No Gallops,Rubs or new Murmurs,  +ve B.Sounds, Abd Soft, No tenderness,   No Cyanosis, Clubbing or edema     Data Review:    Recent Labs  Lab 02/28/24 0228 02/29/24 0518 03/02/24 0518 03/03/24 0831  WBC 12.4* 5.0 4.7 4.9  HGB 13.4 10.5* 10.2* 12.6  HCT 41.6 32.8* 32.5* 39.8  PLT 224 162 167 209  MCV 91.2 92.4 93.1 91.9  MCH 29.4 29.6 29.2 29.1  MCHC 32.2 32.0 31.4 31.7  RDW 14.2 14.4 14.3 14.3  LYMPHSABS 0.7  --  0.8 0.3*  MONOABS 0.6  --  0.4 0.1  EOSABS 0.2   --  0.2 0.0  BASOSABS 0.1  --  0.0 0.0    Recent Labs  Lab 02/28/24 0228 02/28/24 0256 02/28/24 0258 02/28/24 0453 02/28/24 0526 02/29/24 0518 02/29/24 0544 02/29/24 0916 03/02/24 0518 03/03/24 0831  NA 142  --   --   --   --  139  --   --  138 139  K 4.0  --   --   --   --  4.1  --   --  4.6 4.5  CL 100  --   --   --   --  103  --   --  104 105  CO2 28  --   --   --   --  27  --   --  28 24  ANIONGAP 14  --   --   --   --  9  --   --  6 10  GLUCOSE 129*  --   --   --   --  110*  --   --  99 132*  BUN 27*  --   --   --   --  20  --   --  19 30*  CREATININE 1.36*  --   --   --   --  1.08*  --   --  1.04* 1.24*  AST 35  --   --   --   --   --   --   --   --   --   ALT 12  --   --   --   --   --   --   --   --   --   ALKPHOS 40  --   --   --   --   --   --   --   --   --   BILITOT 0.8  --   --   --   --   --   --   --   --   --   ALBUMIN 4.0  --   --   --   --   --   --   --   --   --   CRP  --   --   --   --   --   --   --  4.3*  --   --   PROCALCITON  --   --   --  <0.10  --  <0.10  --   --   --   --   LATICACIDVEN  --  1.9  --   --  1.9  --   --   --   --   --   INR 1.2  --   --   --   --   --   --   --   --   --   BNP  --   --  428.4*  --   --   --  230.3*  --   --   --   MG  --   --   --  1.5*  --  1.6*  --   --  2.0 1.9  PHOS  --   --   --  3.3  --   --   --   --   --   --   CALCIUM 9.5  --   --   --   --  8.6*  --   --  8.8* 9.3      Recent Labs  Lab 02/28/24 0228 02/28/24 0256 02/28/24 0258 02/28/24 0453 02/28/24 0526 02/29/24 0518 02/29/24 0544 02/29/24 0916 03/02/24 0518 03/03/24 0831  CRP  --   --   --   --   --   --   --  4.3*  --   --   PROCALCITON  --   --   --  <0.10  --  <0.10  --   --   --   --   LATICACIDVEN  --  1.9  --   --  1.9  --   --   --   --   --   INR 1.2  --   --   --   --   --   --   --   --   --   BNP  --   --  428.4*  --   --   --  230.3*  --   --   --   MG  --   --   --  1.5*  --  1.6*  --   --  2.0 1.9  CALCIUM 9.5  --   --   --    --  8.6*  --   --  8.8* 9.3    --------------------------------------------------------------------------------------------------------------- No results found for: CHOL, HDL, LDLCALC, LDLDIRECT, TRIG, CHOLHDL  No results found for: HGBA1C No results for input(s): TSH, T4TOTAL, FREET4, T3FREE, THYROIDAB in the last 72 hours. No results for input(s): VITAMINB12, FOLATE, FERRITIN, TIBC, IRON, RETICCTPCT in the last 72 hours. ------------------------------------------------------------------------------------------------------------------ Cardiac Enzymes No results for input(s): CKMB, TROPONINI, MYOGLOBIN in the last 168 hours.  Invalid input(s): CK  Micro Results Recent Results (from the past 240 hours)  Blood Culture (routine x 2)     Status: None (Preliminary result)   Collection Time: 02/28/24  2:28 AM   Specimen: BLOOD  Result Value Ref Range Status   Specimen Description BLOOD LEFT ANTECUBITAL  Final   Special Requests   Final    BOTTLES DRAWN AEROBIC AND ANAEROBIC Blood Culture results may not be optimal due to an inadequate volume of blood received in culture bottles   Culture   Final    NO GROWTH 4 DAYS Performed at [facility], [address] [place], [place] [address]    Report Status PENDING  Incomplete  Resp panel by RT-PCR (RSV, Flu A&B, Covid) Anterior Nasal Swab     Status: None   Collection Time: 02/28/24  2:31 AM   Specimen: Anterior Nasal Swab  Result Value Ref Range Status   SARS Coronavirus 2 by RT PCR NEGATIVE NEGATIVE Final   Influenza A by PCR NEGATIVE NEGATIVE Final   Influenza B by PCR NEGATIVE NEGATIVE Final    Comment: (NOTE) The Xpert Xpress SARS-CoV-2/FLU/RSV plus assay is intended as an aid in the diagnosis of influenza from Nasopharyngeal swab specimens and should not be used as a sole basis for treatment. Nasal washings and aspirates are unacceptable for Xpert Xpress  SARS-CoV-2/FLU/RSV testing.  Fact Sheet for Patients: BloggerCourse.com  Fact Sheet for Healthcare Providers: SeriousBroker.it  This test is not yet approved or cleared by the [place]  FDA and has been authorized for detection and/or diagnosis of SARS-CoV-2 by FDA under an Emergency Use Authorization (EUA). This EUA will remain in effect (meaning this test can be used) for the duration of the COVID-19 declaration under Section 564(b)(1) of the Act, 21 U.S.C. section 360bbb-3(b)(1), unless the authorization is terminated or revoked.     Resp Syncytial Virus by PCR NEGATIVE NEGATIVE Final    Comment: (NOTE) Fact Sheet for Patients: BloggerCourse.com  Fact Sheet for Healthcare Providers: SeriousBroker.it  This test is not yet approved or cleared by the [place]  FDA and has been authorized for detection and/or diagnosis of SARS-CoV-2 by FDA under an Emergency Use Authorization (EUA). This EUA will remain in effect (meaning this test can be used) for the duration of the COVID-19 declaration under Section 564(b)(1) of the Act, 21 U.S.C. section 360bbb-3(b)(1), unless the authorization is terminated or revoked.  Performed at [facility], [address] [place], [place] [address]   Blood Culture (routine x 2)     Status: None (Preliminary result)   Collection Time: 02/28/24  2:33 AM   Specimen: BLOOD  Result Value Ref Range Status   Specimen Description BLOOD BLOOD RIGHT ARM  Final   Special Requests   Final    BOTTLES DRAWN AEROBIC ONLY Blood Culture results may not be optimal due to an inadequate volume of blood received in culture bottles   Culture  Final    NO GROWTH 4 DAYS Performed at [facility], [address] [place], [place] [address]    Report Status PENDING  Incomplete  MRSA Next Gen by PCR, Nasal     Status: None   Collection Time:  02/29/24  5:46 AM   Specimen: Nasal Mucosa; Nasal Swab  Result Value Ref Range Status   MRSA by PCR Next Gen NOT DETECTED NOT DETECTED Final    Comment: (NOTE) The GeneXpert MRSA Assay (FDA approved for NASAL specimens only), is one component of a comprehensive MRSA colonization surveillance program. It is not intended to diagnose MRSA infection nor to guide or monitor treatment for MRSA infections. Test performance is not FDA approved in patients less than 40 years old. Performed at [facility], [address] [place], [place] [address]     Radiology Report DG CHEST PORT 1 VIEW Result Date: 03/03/2024 EXAM: 1 VIEW XRAY OF THE CHEST 03/03/2024 06:26:58 AM COMPARISON: None available. CLINICAL HISTORY: Hypoxia 200808. HYPOXIA,BI-PAP; ROVER FINDINGS: LUNGS AND PLEURA: Interval improvement in previously noted diffuse increased interstitial opacities. Persistent mild left lower lobe opacity may represent atelectasis or airspace disease. No pleural effusion. No pneumothorax. HEART AND MEDIASTINUM: Stable cardiac enlargement. Left chest wall pacer noted with leads in the right atrial appendage and right ventricle. BONES AND SOFT TISSUES: No acute osseous abnormality. IMPRESSION: 1. Interval improvement in previously noted interstitial edema. 2. Persistent mild left lower lobe opacity, possibly atelectasis or airspace disease. Electronically signed by: Waddell Calk MD 03/03/2024 07:04 AM EDT RP Workstation: [id number]   DG Chest Port 1 View Result Date: 03/02/2024 CLINICAL DATA:  Acute respiratory distress EXAM: PORTABLE CHEST 1 VIEW COMPARISON:  02/29/2024 FINDINGS: Lungs are symmetrically well expanded. No pneumothorax or pleural effusion. Diffuse interstitial pulmonary infiltrate, progressive since prior examination asymmetrically more severe within the left lung, most in keeping with mild asymmetric pulmonary edema. Left subclavian dual lead pacemaker in place with leads within the right  atrium and right ventricle. Stable mild cardiomegaly. No acute bone abnormality. IMPRESSION: 1. Mild asymmetric pulmonary edema, progressive since prior examination. 2. Stable mild cardiomegaly. Electronically Signed   By: Dorethia Molt M.D.   On: 03/02/2024 23:00   DG Swallowing Func-Speech Pathology Result Date: 03/01/2024 Table formatting from the original result was not included. Modified Barium Swallow Study Patient Details Name: SECILY WALTHOUR MRN: [id number] Date of Birth: April 01, 1941 Today's Date: 03/01/2024 HPI/PMH: HPI: DALANI METTE is an 83 yo female presenting to ED 8/11 with back pain and shortness of breath. Admitted with sepsis secondary to PNA. CTA shows RUL patchy airspace disease consistent with multifocal PNA and new patchy ground-glass disease in the RLL. Recently hospitalized 7/28-7/29 for SIRS with question of UTI vs upper respiratory infection.  SLP consulted due to recurrent PNA. PMH includes COPD, chronic respiratory failure on 4L, HFpEF, CAD, tachybradycardia syndrome s/p permanent pacemaker, Crohn's disease, SBO, migraine, CKD 3, anxiety Clinical Impression: Clinical Impression: Pt exhibits moderate pharyngeal dysphagia characterized primarily by mistiming and reduced sensation. The swallow is initiated at the pyriform sinuses and epiglottic inversion is incomplete, allowing airway invasion before and during the swallow. This was seen consistently with penetration above the vocal folds until she was challenged with larger sips, which resulted in aspiration with variable sensation (PAS 7, 8). A chin tuck posture did not improve airway protection. Nectar thick liquids result in penetration above the vocal folds that cued intermittent coughing clears (PAS 3). Additionally, retention of barium was noted  when the 13 mm tablet was administered with thin liquids. This may contribute to the symptoms of globus and regurgitation that the pt described to this SLP during evalutation previous  date (could consider further esophageal assessment). For now, recommend continuing regular diet but with nectar thick liquids pending ongoing education and discussion with the pt and medical team. Prolonged use of thickener is not recommended nor does it prevent aspiration or decrease risk of adverse events that may occur in its presence. Will plan to f/u with MD and pt as able. Factors that may increase risk of adverse event in presence of aspiration Noe & Lianne 2021): Factors that may increase risk of adverse event in presence of aspiration Noe & Lianne 2021): Poor general health and/or compromised immunity; Respiratory or GI disease; Reduced cognitive function; Frail or deconditioned; Inadequate oral hygiene Recommendations/Plan: Swallowing Evaluation Recommendations Swallowing Evaluation Recommendations Recommendations: PO diet PO Diet Recommendation: Regular; Mildly thick liquids (Level 2, nectar thick) Liquid Administration via: Cup; Straw Medication Administration: Whole meds with puree Supervision: Patient able to self-feed; Intermittent supervision/cueing for swallowing strategies Swallowing strategies  : Minimize environmental distractions; Slow rate; Small bites/sips Postural changes: Position pt fully upright for meals; Stay upright 30-60 min after meals Oral care recommendations: Oral care QID (4x/day); Oral care before PO Recommended consults: Consider Palliative care Treatment Plan Treatment Plan Treatment recommendations: Therapy as outlined in treatment plan below Follow-up recommendations: Outpatient SLP Functional status assessment: Patient has had a recent decline in their functional status and demonstrates the ability to make significant improvements in function in a reasonable and predictable amount of time. Treatment frequency: Min 2x/week Treatment duration: 2 weeks Interventions: Aspiration precaution training; Patient/family education; Trials of upgraded texture/liquids; Diet  toleration management by SLP Recommendations Recommendations for follow up therapy are one component of a multi-disciplinary discharge planning process, led by the attending physician.  Recommendations may be updated based on patient status, additional functional criteria and insurance authorization. Assessment: Orofacial Exam: Orofacial Exam Oral Cavity: Oral Hygiene: WFL Oral Cavity - Dentition: Missing dentition; Poor condition Orofacial Anatomy: WFL Oral Motor/Sensory Function: WFL Anatomy: Anatomy: Suspected cervical osteophytes Boluses Administered: Boluses Administered Boluses Administered: Thin liquids (Level 0); Mildly thick liquids (Level 2, nectar thick); Moderately thick liquids (Level 3, honey thick); Puree; Solid  Oral Impairment Domain: Oral Impairment Domain Lip Closure: No labial escape Tongue control during bolus hold: Cohesive bolus between tongue to palatal seal Bolus preparation/mastication: Timely and efficient chewing and mashing Bolus transport/lingual motion: Brisk tongue motion Oral residue: Complete oral clearance Location of oral residue : N/A Initiation of pharyngeal swallow : Pyriform sinuses  Pharyngeal Impairment Domain: Pharyngeal Impairment Domain Soft palate elevation: No bolus between soft palate (SP)/pharyngeal wall (PW) Laryngeal elevation: Complete superior movement of thyroid  cartilage with complete approximation of arytenoids to epiglottic petiole Anterior hyoid excursion: Complete anterior movement Epiglottic movement: Partial inversion Laryngeal vestibule closure: Incomplete, narrow column air/contrast in laryngeal vestibule Pharyngeal stripping wave : Present - complete Pharyngeal contraction (A/P view only): N/A Pharyngoesophageal segment opening: Complete distension and complete duration, no obstruction of flow Tongue base retraction: No contrast between tongue base and posterior pharyngeal wall (PPW) Pharyngeal residue: Complete pharyngeal clearance Location of  pharyngeal residue: N/A  Esophageal Impairment Domain: Esophageal Impairment Domain Esophageal clearance upright position: Esophageal retention Pill: Pill Consistency administered: Thin liquids (Level 0) Thin liquids (Level 0): Impaired (see clinical impressions) Penetration/Aspiration Scale Score: Penetration/Aspiration Scale Score 1.  Material does not enter airway: Solid; Pill 2.  Material enters airway, remains ABOVE  vocal cords then ejected out: Moderately thick liquids (Level 3, honey thick); Puree 3.  Material enters airway, remains ABOVE vocal cords and not ejected out: Mildly thick liquids (Level 2, nectar thick) Compensatory Strategies: Compensatory Strategies Compensatory strategies: Yes Chin tuck: Ineffective Ineffective Chin Tuck: Thin liquid (Level 0)   General Information: Caregiver present: No  Diet Prior to this Study: Regular; Thin liquids (Level 0)   Temperature : Normal   Respiratory Status: WFL   Supplemental O2: Nasal cannula   History of Recent Intubation: No  Behavior/Cognition: Alert; Cooperative Self-Feeding Abilities: Able to self-feed Baseline vocal quality/speech: Normal Volitional Cough: Able to elicit Volitional Swallow: Able to elicit Exam Limitations: No limitations Goal Planning: Prognosis for improved oropharyngeal function: Fair Barriers to Reach Goals: Time post onset No data recorded Patient/Family Stated Goal: none stated Consulted and agree with results and recommendations: Patient; Physician Pain: Pain Assessment Pain Assessment: No/denies pain End of Session: Start Time:SLP Start Time (ACUTE ONLY): 1505 Stop Time: SLP Stop Time (ACUTE ONLY): 1524 Time Calculation:SLP Time Calculation (min) (ACUTE ONLY): 19 min Charges: SLP Evaluations $ SLP Speech Visit: 1 Visit SLP Evaluations $BSS Swallow: 1 Procedure $MBS Swallow: 1 Procedure $Swallowing Treatment: 1 Procedure SLP visit diagnosis: SLP Visit Diagnosis: Dysphagia, pharyngoesophageal phase (R13.14) Past Medical History: Past  Medical History: Diagnosis Date  Acute CHF (congestive heart failure) (HCC) 01/02/2022  AKI (acute kidney injury) (HCC) 09/08/2020  Aspiration pneumonia (HCC) 01/05/2019  Atypical chest pain 03/11/2022  Bradycardia 03/14/2020  C. difficile colitis 04/03/2021  CHF (congestive heart failure) (HCC)   COPD (chronic obstructive pulmonary disease) (HCC)   Coronary artery disease   Tachycardia 05/31/2013 Past Surgical History: Past Surgical History: Procedure Laterality Date  ABDOMINAL HYSTERECTOMY    BOWEL RESECTION    CHOLECYSTECTOMY   Damien Blumenthal, M.A., CCC-SLP Speech Language Pathology, Acute Rehabilitation Services Secure Chat preferred [phone] 03/01/2024, 4:37 PM    Signature  -   Lavada Stank M.D on 03/03/2024 at 9:55 AM   -  To page go to [email or web address]

## 2024-03-03 NOTE — Progress Notes (Signed)
 Rapid response called at 2149. Pt had called out c/o sob. Pts 02 sats were 95%. Pts 02 was increased from 4 to 6 with no improvement. Pt became very anxious and the RR team was called. After evaluation the RR Nurse and the respiratory therapist gave a breathing treatment with no improvement. Pt was then placed on Bipap with great improvement. The MD Sonny) came and examined the pt and wrote multiple orders. The pt is currently resting with stable VS

## 2024-03-04 ENCOUNTER — Inpatient Hospital Stay (HOSPITAL_COMMUNITY)

## 2024-03-04 DIAGNOSIS — A419 Sepsis, unspecified organism: Secondary | ICD-10-CM | POA: Diagnosis not present

## 2024-03-04 DIAGNOSIS — J189 Pneumonia, unspecified organism: Secondary | ICD-10-CM | POA: Diagnosis not present

## 2024-03-04 DIAGNOSIS — I5031 Acute diastolic (congestive) heart failure: Secondary | ICD-10-CM | POA: Diagnosis not present

## 2024-03-04 DIAGNOSIS — Z7189 Other specified counseling: Secondary | ICD-10-CM

## 2024-03-04 DIAGNOSIS — Z515 Encounter for palliative care: Secondary | ICD-10-CM

## 2024-03-04 LAB — ECHOCARDIOGRAM COMPLETE
AR max vel: 2.79 cm2
AV Peak grad: 5.5 mmHg
Ao pk vel: 1.18 m/s
Area-P 1/2: 2.62 cm2
Height: 64 in
MV VTI: 2.21 cm2
S' Lateral: 3.3 cm
Weight: 2613.77 [oz_av]

## 2024-03-04 LAB — BASIC METABOLIC PANEL WITH GFR
Anion gap: 13 (ref 5–15)
BUN: 42 mg/dL — ABNORMAL HIGH (ref 8–23)
CO2: 24 mmol/L (ref 22–32)
Calcium: 9.4 mg/dL (ref 8.9–10.3)
Chloride: 102 mmol/L (ref 98–111)
Creatinine, Ser: 1.19 mg/dL — ABNORMAL HIGH (ref 0.44–1.00)
GFR, Estimated: 46 mL/min — ABNORMAL LOW (ref >60)
Glucose, Bld: 118 mg/dL — ABNORMAL HIGH (ref 70–99)
Potassium: 5 mmol/L (ref 3.5–5.1)
Sodium: 139 mmol/L (ref 135–145)

## 2024-03-04 LAB — CULTURE, BLOOD (ROUTINE X 2)
Culture: NO GROWTH
Culture: NO GROWTH

## 2024-03-04 LAB — GLUCOSE, CAPILLARY
Glucose-Capillary: 130 mg/dL — ABNORMAL HIGH (ref 70–99)
Glucose-Capillary: 139 mg/dL — ABNORMAL HIGH (ref 70–99)
Glucose-Capillary: 149 mg/dL — ABNORMAL HIGH (ref 70–99)
Glucose-Capillary: 150 mg/dL — ABNORMAL HIGH (ref 70–99)
Glucose-Capillary: 159 mg/dL — ABNORMAL HIGH (ref 70–99)

## 2024-03-04 LAB — MAGNESIUM: Magnesium: 2 mg/dL (ref 1.7–2.4)

## 2024-03-04 MED ORDER — METHYLPREDNISOLONE SODIUM SUCC 40 MG IJ SOLR: 20.0000 mg | INTRAMUSCULAR | Status: DC

## 2024-03-04 MED ORDER — TRAMADOL HCL 50 MG PO TABS
50.0000 mg | ORAL_TABLET | Freq: Two times a day (BID) | ORAL | Status: DC | PRN
  Administered 2024-03-04: 50 mg via ORAL
  Filled 2024-03-04: qty 1

## 2024-03-04 MED ORDER — DICLOFENAC SODIUM 1 % EX GEL
2.0000 g | Freq: Four times a day (QID) | CUTANEOUS | Status: DC
  Administered 2024-03-04 – 2024-03-05 (×2): 2 g via TOPICAL
  Filled 2024-03-04: qty 100

## 2024-03-04 MED ORDER — POTASSIUM CHLORIDE CRYS ER 20 MEQ PO TBCR
20.0000 meq | EXTENDED_RELEASE_TABLET | Freq: Once | ORAL | Status: AC
  Administered 2024-03-04: 20 meq via ORAL
  Filled 2024-03-04: qty 1

## 2024-03-04 NOTE — Progress Notes (Signed)
 Echocardiogram 2D Echocardiogram has been performed.  Brittany Irwin 03/04/2024, 2:07 PM

## 2024-03-04 NOTE — Plan of Care (Signed)

## 2024-03-04 NOTE — Progress Notes (Signed)
 Mobility Specialist: Progress Note   03/04/24 1400  Mobility  Activity Ambulated with assistance  Level of Assistance Standby assist, set-up cues, supervision of patient - no hands on  Assistive Device Four wheel walker  Distance Ambulated (ft) 1000 ft  Activity Response Tolerated well  Mobility Referral Yes  Mobility visit 1 Mobility  Mobility Specialist Start Time (ACUTE ONLY) 1052  Mobility Specialist Stop Time (ACUTE ONLY) 1114  Mobility Specialist Time Calculation (min) (ACUTE ONLY) 22 min    Pt received in bed, agreeable to mobility session. SV throughout. No complaints. Titrated to 6LO2 for ambulation, SpO2 82-91% on 6LO2. Returned to room without fault. Left in bed with all needs met, call bell in reach.   Ileana Lute Mobility Specialist Please contact via SecureChat or Rehab office at [phone]

## 2024-03-04 NOTE — Consult Note (Signed)
 Palliative Care Consult Note                                  Date: 03/04/2024   Patient Name: Brittany Irwin  DOB: 11/06/1940  MRN: [id number]  Age / Sex: 83 y.o., female  PCP: [name], MD Referring Physician: Dennise Lavada POUR, MD  Reason for Consultation: Establishing goals of care  HPI/Patient Profile: 83 y.o. 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   Sepsis secondary to aspiration pneumonia.  Past Medical History:  Diagnosis Date   Acute CHF (congestive heart failure) (HCC) 01/02/2022   AKI (acute kidney injury) (HCC) 09/08/2020   Aspiration pneumonia (HCC) 01/05/2019   Atypical chest pain 03/11/2022   Bradycardia 03/14/2020   C. difficile colitis 04/03/2021   CHF (congestive heart failure) (HCC)    COPD (chronic obstructive pulmonary disease) (HCC)    Coronary artery disease    Tachycardia 05/31/2013    Subjective:   I have reviewed medical records including EPIC notes, labs and imaging, received update from provider, assessed the patient and then met with the patient (who goes by the name Brittany Irwin) to discuss diagnosis prognosis, GOC, EOL wishes, disposition and options.  I introduced Palliative Medicine as specialized medical care for people living with serious illness. It focuses on providing relief from symptoms and stress of a serious illness. The goal is to improve quality of life for both the patient and the family.  Today's Discussion: Patient was surprised and upset that palliative medicine was consulted to be a part of her team.  In her previous experience with palliative medicine a family member was in palliative medicine and quickly transitioned to hospice.  We discussed the  differences between palliative medicine and hospice care.  We discussed patient's chronic conditions and current hospitalization including dysphagia from evaluation by speech therapy.  We discussed the progressive nature of the patient's chronic conditions.  Patient has a good understanding of her chronic conditions and says managing her conditions is a full time job.  Patient moved to [place] three years ago to be closer to her sister [name].  The patient was one of three sisters but their youngest sister passed away unexpectedly at age 26.  Patient is very close with her sister [name] and her family.  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  She enjoys animals and spending time with her cat Izzy.   A discussion was had today regarding advanced directives.  The patient has completed both HCPOA and living will in the past.  Encouraged her to bring these to be scanned into [facility]. Patient's HCPOA is her sister [name]. Recommended consideration of DNR status, understanding evidenced-based poor outcomes in similar hospitalized patients, as the cause of the arrest is likely associated with chronic/terminal disease rather than a reversible acute cardio-pulmonary event. Patient initially stated she would not want a resuscitation but then said she needed time to discuss it with her sister before making any changes. Patient remains full code. Discussed the differences between an aggressive medical intervention path and a comfort focused path. Encouraged patient to consider scopes of care and what interventions she may or may not want moving forward.   Patient will likely discharge tomorrow. Discussed outpatient palliative medicine could continue  goals of care discussions. Patient will consider this option.  Discussed the importance of continued conversation with family and the medical providers regarding overall plan of care and treatment options, ensuring decisions are within the context of the patient's values and  GOCs.  Questions and concerns were addressed. Hard Choices booklet left for review. The patient was encouraged to call with questions or concerns. PMT will continue to support holistically.  Review of Systems  Respiratory:  Positive for shortness of breath.   Gastrointestinal:  Positive for diarrhea.  Musculoskeletal:  Positive for back pain (chronic).    Objective:   Primary Diagnoses: Present on Admission:  Sepsis due to pneumonia (HCC)  Acute on chronic hypoxic respiratory failure (HCC)  Crohn disease (HCC)  Chronic back pain  COPD (chronic obstructive pulmonary disease) (HCC)  Elevated troponin  AKI (acute kidney injury) (HCC)  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)  Tachycardia-bradycardia syndrome (HCC)  Presence of permanent cardiac pacemaker  Anxiety  Hyperlipidemia   Physical Exam Vitals reviewed.  Constitutional:      General: She is not in acute distress.    Interventions: Nasal cannula in place.  HENT:     Head: Normocephalic and atraumatic.  Cardiovascular:     Rate and Rhythm: Normal rate.  Pulmonary:     Effort: Pulmonary effort is normal.  Skin:    General: Skin is warm and dry.  Neurological:     Mental Status: She is alert and oriented to person, place, and time.  Psychiatric:        Mood and Affect: Mood is anxious.     Vital Signs:  BP (!) 144/79 (BP Location: Right Arm)   Pulse 71   Temp 98 F (36.7 C) (Oral)   Resp 16   Ht 5' 4 (1.626 m)   Wt 74.1 kg   SpO2 91%   BMI 28.04 kg/m    Advanced Care Planning:   Existing [facility]/ACP Documentation: None  Primary Decision Maker: PATIENT  Code Status/Advance Care Planning: Full code   Assessment & Plan:   SUMMARY OF RECOMMENDATIONS   Full code  Full scope Encouraged patient to discuss goals of care with her sister [name] Continued PMT support  Discussed with: bedside RN and Dr. Dennise  Time Total: 90 minutes    Thank you for allowing us  to participate in  the care of KELVIN BURPEE PMT will continue to support holistically.  Signed by: [name] Palin, NP Palliative Medicine Team  Team Phone # [phone] (Nights/Weekends)  03/04/2024, 5:36 PM

## 2024-03-04 NOTE — Progress Notes (Signed)
 PROGRESS NOTE                                                                                                                                                                                                             Patient Demographics:    Brittany Irwin, is a 83 y.o. female, DOB - 1940-12-13, FMW:[id number]  Outpatient Primary MD for the patient is [name], MD    LOS - 5  Admit date - 02/28/2024    Chief Complaint  Patient presents with   Back Pain   Shortness of Breath       Brief Narrative (HPI from H&P)   83 y.o. female with medical history significant of COPD, chronic respiratory failure on 4 L, HfpEF, CAD, tachybradycardia syndrome s/p permanent pacemaker, Crohn's disease, SBO, psoriasis, migraine headaches, CKD stage III, and anxiety presents with complaints of back pain and shortness of breath. She is accompanied by her sister.   Records note she had just recently been hospitalized from 7/28 -7/29 for SIRS with question of a urinary tract infection versus possible upper respiratory infection, but thought likely secondary to the patient being acutely dehydrated.  Experiences a persistent cough, sometimes productive, along with episodes of shaking chills and feeling extremely cold, similar to previous pneumonia episodes.   She recently started on Eliquis  for atrial fibrillation, identified during a pacemaker check. She takes two tablets per day, one in the morning and one in the evening.  She presented to the hospital with shortness of breath diagnosed with acute hypoxic respiratory failure due to pneumonia.   Subjective:   Patient in bed, appears comfortable, denies any headache, no fever, no chest pain or pressure, no shortness of breath , no abdominal pain. No new focal weakness.   Assessment  & Plan :    Acute on chronic respiratory failure with hypoxia - Sepsis secondary to aspiration pneumonia -  POA.  Uses 4 L nasal cannula oxygen at baseline. Patient presented with fever up to 101.2 F with leukocytosis.  Currently requiring 6 L of nasal cannula oxygen maintain O2 saturations greater than 90%.  CTA of the chest noted concerns for a multifocal right-sided pneumonia.  She has been placed on empiric antibiotics which are Rocephin  and doxycycline , follow cultures, encouraged to sit in chair use ice and flutter valve for pulmonary toiletry, per history  likely has underlying dysphagia, confirmed by speech therapy via MBS, on nectar thick liquids, patient counseled, remains at risk for continued aspiration, will get palliative care input as well.   COPD Patient without significant wheezes or rhonchi appreciated on physical exam,  Continue home inhaler,  Levalbuterol  as needed for shortness of breath/wheezing   Elevated troponin  Acute.  Patient does not report having any specific chest pain.  High-sensitivity troponin 188-> 175.  EKG similar to prior.  Question if secondary to demand. Continue to monitor   CKD 3 A- creatinine usually between 1.1 and 1.4 in the outpatient setting, monitor closely.  Back pain Acute on chronic.  Patient reports having acute worsening of her back pain.  Denies having any significant injury to onset symptoms.  Home medication regimen includes Tylenol  4.  Oxycodone  as needed for pain   Acute on chronic diastolic CHF night of 03/02/2024.  EF 65%, repeat echocardiogram, stable troponin trend which so far appears flat and in non-ACS pattern, no chest pain, EKG nonacute, started on IV Lasix , required BiPAP at night of 03/02/2024, continue to monitor closely, resume diuretics, monitor dose closely.   Paroxysmal atrial fibrillation, tachybradycardia syndrome S/p PPM  Patient had a [employer] pacemaker placed back in 02/2023.  Followed by Dr. Luke of cardiology at [facility].  On Eliquis  chronically.   History of Crohn's disease   Patient does not report having any significant  episodes of diarrhea or blood in stools.   Anxiety  Chronic.   Continue Xanax  as needed   Neuropathy  - Continue Lyrica  and gabapentin .   Hypomagnesemia.  Replaced.    Hyperlipidemia  - Continue fenofibrate  and Zetia    GERD   - Continue PPI         Condition - Fair  Family Communication  : Darin Handing [phone]  on 02/29/2024 at 8:16 AM  Code Status :  Full  Consults  :  None  PUD Prophylaxis :  PPI   Procedures  :     TTE -   CT head, CTA chest abdomen pelvis.  1. No acute intracranial CT findings or interval changes. Stable exam. 2. 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 3. Enlarged pulmonary trunk indicating arterial hypertension. No arterial embolus is seen. 4. Right upper lobe patchy airspace disease consistent with multifocal pneumonia. 5. New patchy ground-glass disease in the right lower lobe favored to be pneumonitis, versus asymmetric atelectasis. 6. New 5 mm rounded nodule in the posteromedial base of the right lower lobe, probably an inflammatory nodule. Follow-up study recommended after treatment of pneumonia. 7. Stable mediastinal and right hilar adenopathy. 8. Emphysema and bronchitis. 9. No acute findings in the abdomen or pelvis. 10. Chronic thickened folds in the stomach. 11. Diverticulosis without evidence of diverticulitis. 12. Pelvic floor laxity. 13. Osteopenia and degenerative change. Aortic Atherosclerosis (ICD10-I70.0) and Emphysema (ICD10-J43.9)      Disposition Plan  :    Status is: Inpatient   DVT Prophylaxis  :    SCDs Start: 02/28/24 9390 apixaban  (ELIQUIS ) tablet 5 mg     Lab Results  Component Value Date   PLT 209 03/03/2024    Diet :  Diet Order             Diet Heart Room service appropriate? Yes; Fluid consistency: Nectar Thick  Diet effective now                    Inpatient Medications  Scheduled Meds:  ALPRAZolam   1  mg Oral BID   apixaban   5 mg Oral BID   arformoterol   15 mcg  Nebulization BID   atenolol   25 mg Oral Daily   budesonide  (PULMICORT ) nebulizer solution  0.5 mg Nebulization BID   Chlorhexidine  Gluconate Cloth  6 each Topical Daily   doxycycline   100 mg Oral Q12H   ezetimibe   10 mg Oral QHS   fenofibrate   160 mg Oral Daily   furosemide   40 mg Intravenous Daily   guaiFENesin   600 mg Oral BID   methylPREDNISolone  (SOLU-MEDROL ) injection  20 mg Intravenous Q24H   pantoprazole   40 mg Oral Daily   potassium chloride   20 mEq Oral Once   pregabalin   50 mg Oral BID   revefenacin   175 mcg Nebulization Daily   sodium chloride  flush  3 mL Intravenous Q12H   Continuous Infusions:  cefTRIAXone  (ROCEPHIN )  IV 1 g (03/03/24 1044)   PRN Meds:.acetaminophen  **OR** acetaminophen , cyclobenzaprine , ipratropium-albuterol , melatonin, ondansetron  (ZOFRAN ) IV, oxyCODONE -acetaminophen , trimethobenzamide      Objective:   Vitals:   03/04/24 0316 03/04/24 0733 03/04/24 0800 03/04/24 0813  BP: (!) 146/57 (!) 153/79 (!) 152/63   Pulse: 60 69 62 61  Resp: 13 19 11  (!) 9  Temp: (!) 97.5 F (36.4 C) (!) 97.5 F (36.4 C)    TempSrc: Axillary Oral    SpO2: 92% 94% (!) 89% 92%  Weight:      Height:        Wt Readings from Last 3 Encounters:  03/02/24 74.1 kg  02/15/24 70.1 kg  09/30/23 71.9 kg     Intake/Output Summary (Last 24 hours) at 03/04/2024 0815 Last data filed at 03/03/2024 2319 Gross per 24 hour  Intake 3 ml  Output 800 ml  Net -797 ml      Physical Exam  Awake Alert, No new F.N deficits, Normal affect Nelson.AT,PERRAL Supple Neck, No JVD,   Symmetrical Chest wall movement, Good air movement bilaterally, CTAB RRR,No Gallops,Rubs or new Murmurs,  +ve B.Sounds, Abd Soft, No tenderness,   No Cyanosis, Clubbing or edema     Data Review:    Recent Labs  Lab 02/28/24 0228 02/29/24 0518 03/02/24 0518 03/03/24 0831  WBC 12.4* 5.0 4.7 4.9  HGB 13.4 10.5* 10.2* 12.6  HCT 41.6 32.8* 32.5* 39.8  PLT 224 162 167 209  MCV 91.2 92.4 93.1 91.9   MCH 29.4 29.6 29.2 29.1  MCHC 32.2 32.0 31.4 31.7  RDW 14.2 14.4 14.3 14.3  LYMPHSABS 0.7  --  0.8 0.3*  MONOABS 0.6  --  0.4 0.1  EOSABS 0.2  --  0.2 0.0  BASOSABS 0.1  --  0.0 0.0    Recent Labs  Lab 02/28/24 0228 02/28/24 0256 02/28/24 0258 02/28/24 0453 02/28/24 0526 02/29/24 0518 02/29/24 0544 02/29/24 0916 03/02/24 0518 03/03/24 0831 03/04/24 0613  NA 142  --   --   --   --  139  --   --  138 139  --   K 4.0  --   --   --   --  4.1  --   --  4.6 4.5  --   CL 100  --   --   --   --  103  --   --  104 105  --   CO2 28  --   --   --   --  27  --   --  28 24  --   ANIONGAP 14  --   --   --   --  9  --   --  6 10  --   GLUCOSE 129*  --   --   --   --  110*  --   --  99 132*  --   BUN 27*  --   --   --   --  20  --   --  19 30*  --   CREATININE 1.36*  --   --   --   --  1.08*  --   --  1.04* 1.24*  --   AST 35  --   --   --   --   --   --   --   --   --   --   ALT 12  --   --   --   --   --   --   --   --   --   --   ALKPHOS 40  --   --   --   --   --   --   --   --   --   --   BILITOT 0.8  --   --   --   --   --   --   --   --   --   --   ALBUMIN 4.0  --   --   --   --   --   --   --   --   --   --   CRP  --   --   --   --   --   --   --  4.3*  --  0.6  --   PROCALCITON  --   --   --  <0.10  --  <0.10  --   --   --  <0.10  --   LATICACIDVEN  --  1.9  --   --  1.9  --   --   --   --   --   --   INR 1.2  --   --   --   --   --   --   --   --   --   --   BNP  --   --  428.4*  --   --   --  230.3*  --   --  1,045.8*  --   MG  --   --   --  1.5*  --  1.6*  --   --  2.0 1.9 2.0  PHOS  --   --   --  3.3  --   --   --   --   --   --   --   CALCIUM 9.5  --   --   --   --  8.6*  --   --  8.8* 9.3  --       Recent Labs  Lab 02/28/24 0228 02/28/24 0256 02/28/24 0258 02/28/24 0453 02/28/24 0526 02/29/24 0518 02/29/24 0544 02/29/24 0916 03/02/24 0518 03/03/24 0831 03/04/24 0613  CRP  --   --   --   --   --   --   --  4.3*  --  0.6  --   PROCALCITON  --   --   --   <0.10  --  <0.10  --   --   --  <0.10  --   LATICACIDVEN  --  1.9  --   --  1.9  --   --   --   --   --   --   INR 1.2  --   --   --   --   --   --   --   --   --   --   BNP  --   --  428.4*  --   --   --  230.3*  --   --  1,045.8*  --   MG  --   --   --  1.5*  --  1.6*  --   --  2.0 1.9 2.0  CALCIUM 9.5  --   --   --   --  8.6*  --   --  8.8* 9.3  --     --------------------------------------------------------------------------------------------------------------- No results found for: CHOL, HDL, LDLCALC, LDLDIRECT, TRIG, CHOLHDL  No results found for: HGBA1C No results for input(s): TSH, T4TOTAL, FREET4, T3FREE, THYROIDAB in the last 72 hours. No results for input(s): VITAMINB12, FOLATE, FERRITIN, TIBC, IRON, RETICCTPCT in the last 72 hours. ------------------------------------------------------------------------------------------------------------------ Cardiac Enzymes No results for input(s): CKMB, TROPONINI, MYOGLOBIN in the last 168 hours.  Invalid input(s): CK  Micro Results Recent Results (from the past 240 hours)  Blood Culture (routine x 2)     Status: None   Collection Time: 02/28/24  2:28 AM   Specimen: BLOOD  Result Value Ref Range Status   Specimen Description BLOOD LEFT ANTECUBITAL  Final   Special Requests   Final    BOTTLES DRAWN AEROBIC AND ANAEROBIC Blood Culture results may not be optimal due to an inadequate volume of blood received in culture bottles   Culture   Final    NO GROWTH 5 DAYS Performed at [facility], [address], [place], [place] [address]    Report Status 03/04/2024 FINAL  Final  Resp panel by RT-PCR (RSV, Flu A&B, Covid) Anterior Nasal Swab     Status: None   Collection Time: 02/28/24  2:31 AM   Specimen: Anterior Nasal Swab  Result Value Ref Range Status   SARS Coronavirus 2 by RT PCR NEGATIVE NEGATIVE Final   Influenza A by PCR NEGATIVE NEGATIVE Final   Influenza B by PCR NEGATIVE  NEGATIVE Final    Comment: (NOTE) The Xpert Xpress SARS-CoV-2/FLU/RSV plus assay is intended as an aid in the diagnosis of influenza from Nasopharyngeal swab specimens and should not be used as a sole basis for treatment. Nasal washings and aspirates are unacceptable for Xpert Xpress SARS-CoV-2/FLU/RSV testing.  Fact Sheet for Patients: BloggerCourse.com  Fact Sheet for Healthcare Providers: SeriousBroker.it  This test is not yet approved or cleared by the [place]  FDA and has been authorized for detection and/or diagnosis of SARS-CoV-2 by FDA under an Emergency Use Authorization (EUA). This EUA will remain in effect (meaning this test can be used) for the duration of the COVID-19 declaration under Section 564(b)(1) of the Act, 21 U.S.C. section 360bbb-3(b)(1), unless the authorization is terminated or revoked.     Resp Syncytial Virus by PCR NEGATIVE NEGATIVE Final    Comment: (NOTE) Fact Sheet for Patients: BloggerCourse.com  Fact Sheet for Healthcare Providers: SeriousBroker.it  This test is not yet approved or cleared by the [place]  FDA and has been authorized for detection and/or diagnosis of SARS-CoV-2 by FDA under an Emergency Use Authorization (EUA). This EUA will remain in effect (meaning this test can be used) for the duration of the COVID-19 declaration under Section 564(b)(1) of the Act,  21 U.S.C. section 360bbb-3(b)(1), unless the authorization is terminated or revoked.  Performed at [facility], [address] [place], [place] [address]   Blood Culture (routine x 2)     Status: None   Collection Time: 02/28/24  2:33 AM   Specimen: BLOOD  Result Value Ref Range Status   Specimen Description BLOOD BLOOD RIGHT ARM  Final   Special Requests   Final    BOTTLES DRAWN AEROBIC ONLY Blood Culture results may not be optimal due to an inadequate  volume of blood received in culture bottles   Culture   Final    NO GROWTH 5 DAYS Performed at [facility], [address] [place], [place] [address]    Report Status 03/04/2024 FINAL  Final  MRSA Next Gen by PCR, Nasal     Status: None   Collection Time: 02/29/24  5:46 AM   Specimen: Nasal Mucosa; Nasal Swab  Result Value Ref Range Status   MRSA by PCR Next Gen NOT DETECTED NOT DETECTED Final    Comment: (NOTE) The GeneXpert MRSA Assay (FDA approved for NASAL specimens only), is one component of a comprehensive MRSA colonization surveillance program. It is not intended to diagnose MRSA infection nor to guide or monitor treatment for MRSA infections. Test performance is not FDA approved in patients less than 57 years old. Performed at [facility], [address], [place], [place] [address]   Respiratory (~20 pathogens) panel by PCR     Status: None   Collection Time: 03/02/24 10:49 PM   Specimen: Nasopharyngeal Swab; Respiratory  Result Value Ref Range Status   Adenovirus NOT DETECTED NOT DETECTED Final   Coronavirus 229E NOT DETECTED NOT DETECTED Final    Comment: (NOTE) The Coronavirus on the Respiratory Panel, DOES NOT test for the novel  Coronavirus (2019 nCoV)    Coronavirus HKU1 NOT DETECTED NOT DETECTED Final   Coronavirus NL63 NOT DETECTED NOT DETECTED Final   Coronavirus OC43 NOT DETECTED NOT DETECTED Final   Metapneumovirus NOT DETECTED NOT DETECTED Final   Rhinovirus / Enterovirus NOT DETECTED NOT DETECTED Final   Influenza A NOT DETECTED NOT DETECTED Final   Influenza B NOT DETECTED NOT DETECTED Final   Parainfluenza Virus 1 NOT DETECTED NOT DETECTED Final   Parainfluenza Virus 2 NOT DETECTED NOT DETECTED Final   Parainfluenza Virus 3 NOT DETECTED NOT DETECTED Final   Parainfluenza Virus 4 NOT DETECTED NOT DETECTED Final   Respiratory Syncytial Virus NOT DETECTED NOT DETECTED Final   Bordetella pertussis NOT DETECTED NOT DETECTED Final    Bordetella Parapertussis NOT DETECTED NOT DETECTED Final   Chlamydophila pneumoniae NOT DETECTED NOT DETECTED Final   Mycoplasma pneumoniae NOT DETECTED NOT DETECTED Final    Comment: Performed at [facility], [address] [place], [place] [address]    Radiology Report DG CHEST PORT 1 VIEW Result Date: 03/03/2024 EXAM: 1 VIEW XRAY OF THE CHEST 03/03/2024 06:26:58 AM COMPARISON: None available. CLINICAL HISTORY: Hypoxia 200808. HYPOXIA,BI-PAP; ROVER FINDINGS: LUNGS AND PLEURA: Interval improvement in previously noted diffuse increased interstitial opacities. Persistent mild left lower lobe opacity may represent atelectasis or airspace disease. No pleural effusion. No pneumothorax. HEART AND MEDIASTINUM: Stable cardiac enlargement. Left chest wall pacer noted with leads in the right atrial appendage and right ventricle. BONES AND SOFT TISSUES: No acute osseous abnormality. IMPRESSION: 1. Interval improvement in previously noted interstitial edema. 2. Persistent mild left lower lobe opacity, possibly atelectasis or airspace disease. Electronically  signed by: Waddell Calk MD 03/03/2024 07:04 AM EDT RP Workstation: [id number]   DG Chest Port 1 View Result Date: 03/02/2024 CLINICAL DATA:  Acute respiratory distress EXAM: PORTABLE CHEST 1 VIEW COMPARISON:  02/29/2024 FINDINGS: Lungs are symmetrically well expanded. No pneumothorax or pleural effusion. Diffuse interstitial pulmonary infiltrate, progressive since prior examination asymmetrically more severe within the left lung, most in keeping with mild asymmetric pulmonary edema. Left subclavian dual lead pacemaker in place with leads within the right atrium and right ventricle. Stable mild cardiomegaly. No acute bone abnormality. IMPRESSION: 1. Mild asymmetric pulmonary edema, progressive since prior examination. 2. Stable mild cardiomegaly. Electronically Signed   By: Dorethia Molt M.D.   On: 03/02/2024 23:00     Signature  -   Lavada Stank  M.D on 03/04/2024 at 8:15 AM   -  To page go to [email or web address]

## 2024-03-05 DIAGNOSIS — A419 Sepsis, unspecified organism: Secondary | ICD-10-CM | POA: Diagnosis not present

## 2024-03-05 DIAGNOSIS — J189 Pneumonia, unspecified organism: Secondary | ICD-10-CM | POA: Diagnosis not present

## 2024-03-05 LAB — BASIC METABOLIC PANEL WITH GFR
Anion gap: 13 (ref 5–15)
BUN: 52 mg/dL — ABNORMAL HIGH (ref 8–23)
CO2: 27 mmol/L (ref 22–32)
Calcium: 9.6 mg/dL (ref 8.9–10.3)
Chloride: 100 mmol/L (ref 98–111)
Creatinine, Ser: 1.33 mg/dL — ABNORMAL HIGH (ref 0.44–1.00)
GFR, Estimated: 40 mL/min — ABNORMAL LOW (ref >60)
Glucose, Bld: 95 mg/dL (ref 70–99)
Potassium: 4.3 mmol/L (ref 3.5–5.1)
Sodium: 140 mmol/L (ref 135–145)

## 2024-03-05 LAB — GLUCOSE, CAPILLARY
Glucose-Capillary: 96 mg/dL (ref 70–99)
Glucose-Capillary: 89 mg/dL (ref 70–99)

## 2024-03-05 MED ORDER — FUROSEMIDE 10 MG/ML IJ SOLN
20.0000 mg | Freq: Once | INTRAMUSCULAR | Status: AC
  Administered 2024-03-05: 20 mg via INTRAVENOUS
  Filled 2024-03-05: qty 2

## 2024-03-05 MED ORDER — SPIRONOLACTONE 25 MG PO TABS: 12.5000 mg | ORAL_TABLET | Freq: Every day | ORAL | 0 refills | Status: AC

## 2024-03-05 MED ORDER — FUROSEMIDE 40 MG PO TABS: 40.0000 mg | ORAL_TABLET | Freq: Every day | ORAL | 0 refills | Status: DC

## 2024-03-05 MED ORDER — THICK-IT PO POWD: ORAL | 0 refills | Status: DC

## 2024-03-05 NOTE — Discharge Summary (Signed)
 Brittany Irwin FMW:[id number] DOB: [birth year] DOA: 02/28/2024  PCP: [name], MD  Admit date: 02/28/2024  Discharge date: 03/05/2024  Admitted From: Home   Disposition:  Home   Recommendations for Outpatient Follow-up:   Follow up with PCP in 1-2 weeks  PCP Please obtain BMP/CBC, 2 view CXR in 1week,  (see Discharge instructions)   PCP Please follow up on the following pending results:    Home Health: PT, RN and speech therapy if she qualifies Equipment/Devices: No new equipment Consultations: [facility] care Discharge Condition: Stable    CODE STATUS: Full    Diet Recommendation: Heart Healthy with nectar thick liquids, 1.5 L fluid restriction per day    Chief Complaint  Patient presents with   Back Pain   Shortness of Breath     Brief history of present illness from the day of admission and additional interim summary    83 y.o. female with medical history significant of COPD, chronic respiratory failure on 4 L, HfpEF, CAD, tachybradycardia syndrome s/p permanent pacemaker, Crohn's disease, SBO, psoriasis, migraine headaches, CKD stage III, and anxiety presents with complaints of back pain and shortness of breath. She is accompanied by her sister.   Records note she had just recently been hospitalized from 7/28 -7/29 for SIRS with question of a urinary tract infection versus possible upper respiratory infection, but thought likely secondary to the patient being acutely dehydrated.  Experiences a persistent cough, sometimes productive, along with episodes of shaking chills and feeling extremely cold, similar to previous pneumonia episodes.   She recently started on Eliquis  for atrial fibrillation, identified during a pacemaker check. She takes two tablets per day, one in the morning and one in the  evening.  She presented to the hospital with shortness of breath diagnosed with acute hypoxic respiratory failure due to pneumonia.                                                                 Hospital Course    Acute on chronic respiratory failure with hypoxia - Sepsis secondary to aspiration pneumonia - POA.  Uses 4 L nasal cannula oxygen at baseline. Patient presented with fever up to 101.2 F with leukocytosis, clinically had aspiration pneumonia along with some CHF, seen by speech therapy, treated with antibiotics and supplemental oxygen, she is now stable and better than her baseline, of note she uses 4 L nasal cannula oxygen at home and was requiring up to 6 L here, currently on 3 L and symptom-free.  She has finished her antibiotic course, per speech therapy she will require nectar thick liquids at home, home speech therapy if she qualifies will be ordered.  Also placed on fluid restriction and scheduled diuretics.  PCP to monitor closely.   Acute on chronic diastolic CHF  .  EF 65%, repeat echocardiogram remained stable, stable troponin trend with flat and in non-ACS pattern, no chest pain, EKG nonacute, was diuresed with IV Lasix , much improved, will be placed on scheduled Lasix  and low-dose Aldactone , PCP to monitor fluid status, BMP and blood pressure closely, will require to follow-up with her primary cardiologist in 7 to 10 days postdischarge, again request PCP to arrange for it..  Paroxysmal atrial fibrillation, tachybradycardia syndrome S/p PPM  Patient had a [employer] pacemaker placed back in 02/2023.  Followed by Dr. Luke of cardiology at [facility].  On Eliquis  chronically.  Requested to follow-up with her cardiologist within a week of discharge.   COPD Patient without significant wheezes continue home regimen unchanged, uses 4 L nasal cannula oxygen at home.   CKD 3 A- creatinine usually between 1.1 and 1.4 in the outpatient setting, close to her baseline PCP to monitor.  Back  pain Acute on chronic.  New home regimen and follow-up with PCP.   History of Crohn's disease   Patient does not report having any significant episodes of diarrhea or blood in stools.   Anxiety  Chronic.   Continue Xanax  as needed   Neuropathy  - Continue Lyrica  and gabapentin .   Hypomagnesemia.  Replaced.     Hyperlipidemia  - Continue fenofibrate  and Zetia    GERD   - Continue PPI   Discharge diagnosis     Principal Problem:   Sepsis due to pneumonia [facility]) Active Problems:   Acute on chronic hypoxic respiratory failure (HCC)   COPD (chronic obstructive pulmonary disease) (HCC)   Elevated troponin   AKI (acute kidney injury) (HCC)   Chronic back pain   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)   Tachycardia-bradycardia syndrome (HCC)   Presence of permanent cardiac pacemaker   Crohn disease (HCC)   Anxiety   Neuropathy   Hyperlipidemia   Acute on chronic respiratory failure with hypoxia (HCC)   Multifocal pneumonia    Discharge instructions    Discharge Instructions     Discharge instructions   Complete by: As directed    Use any over-the-counter thickening product to make your liquids nectar thick consistency.    Follow with Primary MD [name], MD in 7 days   Get CBC, CMP, Magnesium , 2 view Chest X ray -  checked next visit with your primary MD    Activity: As tolerated with Full fall precautions use walker/cane & assistance as needed  Disposition Home   Diet: Heart Healthy with nectar thick liquids with feeding assistance and aspiration precautions.  Special Instructions: If you have smoked or chewed Tobacco  in the last 2 yrs please stop smoking, stop any regular Alcohol   and or any Recreational drug use.  On your next visit with your primary care physician please Get Medicines reviewed and adjusted.  Please request your Prim.MD to go over all Hospital Tests and Procedure/Radiological results at the follow up, please get all Hospital  records sent to your Prim MD by signing hospital release before you go home.  If you experience worsening of your admission symptoms, develop shortness of breath, life threatening emergency, suicidal or homicidal thoughts you must seek medical attention immediately by calling 911 or calling your MD immediately  if symptoms less severe.  You Must read complete instructions/literature along with all the possible adverse reactions/side effects for all the Medicines you take and that have been prescribed to you. Take any new Medicines after you have completely understood and accpet all the  possible adverse reactions/side effects.   Do not drive when taking Pain medications.  Do not take more than prescribed Pain, Sleep and Anxiety Medications  Wear Seat belts while driving.   Increase activity slowly   Complete by: As directed        Discharge Medications   Allergies as of 03/05/2024       Reactions   Methylprednisolone  Other (See Comments)   Increased BP, HR, agitation, hallucinations    Shellfish Allergy Nausea And Vomiting   All SEAFOOD   Ativan  [lorazepam ] Other (See Comments)   Psychosis   Azulfidine [sulfasalazine] Other (See Comments)   Headache    Epipen [epinephrine] Hypertension   Flagyl [metronidazole] Hives   Motrin [ibuprofen] Nausea Only, Other (See Comments)   Told not to take medication due to GI distress caused by crohn's disease.   Nsaids Other (See Comments)   Told not to take NSAIDs due to GI distress caused by crohn's disease   Penicillins Hives   12/2021, 11/2023 tolerated cephalosporins    Morphine And Codeine  Itching   Told not to take due to GI distress caused by crohn's disease        Medication List     STOP taking these medications    bumetanide  1 MG tablet Commonly known as: BUMEX    ondansetron  4 MG tablet Commonly known as: ZOFRAN        TAKE these medications    acetaminophen -codeine  300-60 MG tablet Commonly known as: TYLENOL   #4 Take 1 tablet by mouth every 6 (six) hours as needed for pain.   ALPRAZolam  1 MG tablet Commonly known as: XANAX  Take 1 mg by mouth 2 (two) times daily.   apixaban  5 MG Tabs tablet Commonly known as: ELIQUIS  Take 5 mg by mouth 2 (two) times daily.   atenolol  25 MG tablet Commonly known as: Tenormin  Take 1 tablet (25 mg total) by mouth daily.   cyanocobalamin 1000 MCG/ML injection Commonly known as: VITAMIN B12 Inject 1,000 mcg into the muscle every 30 (thirty) days.   cyclobenzaprine  5 MG tablet Commonly known as: FLEXERIL  Take 5 mg by mouth daily as needed for muscle spasms.   ergocalciferol 1.25 MG (50000 UT) capsule Commonly known as: VITAMIN D2 Take 50,000 Units by mouth once a week. Every thursday   ezetimibe  10 MG tablet Commonly known as: ZETIA  Take 10 mg by mouth at bedtime.   fenofibrate  micronized 200 MG capsule Commonly known as: LOFIBRA Take 200 mg by mouth daily.   fluconazole  150 MG tablet Commonly known as: DIFLUCAN  Take 150 mg by mouth daily.   furosemide  40 MG tablet Commonly known as: Lasix  Take 1 tablet (40 mg total) by mouth daily.   ICY HOT BACK EX Apply 1 Application topically 2 (two) times daily as needed (back pain).   levalbuterol  0.63 MG/3ML nebulizer solution Commonly known as: XOPENEX  Take 3 mLs (0.63 mg total) by nebulization every 4 (four) hours as needed for wheezing or shortness of breath.   loratadine  10 MG tablet Commonly known as: CLARITIN  Take 1 tablet (10 mg total) by mouth daily for 14 days. What changed:  when to take this reasons to take this   nitroGLYCERIN 0.4 MG SL tablet Commonly known as: NITROSTAT Place 0.4 mg under the tongue every 5 (five) minutes x 3 doses as needed for chest pain.   omeprazole 40 MG capsule Commonly known as: PRILOSEC Take 40 mg by mouth daily.   pregabalin  50 MG capsule Commonly known as: LYRICA  Take 50  mg by mouth 2 (two) times daily.   spironolactone  25 MG tablet Commonly  known as: Aldactone  Take 0.5 tablets (12.5 mg total) by mouth daily.   Thick-It Powd Generic drug: STARCH-MALTO DEXTRIN Make liquids nectar thick. Kindly dispense any thickening product to make liquids nectar thick consistency, 1 month supply no refills.   Trelegy Ellipta  200-62.5-25 MCG/ACT Aepb Generic drug: Fluticasone -Umeclidin-Vilant Inhale 1 puff into the lungs daily.   Voltaren  Arthritis Pain 1 % Gel Generic drug: diclofenac  Sodium Apply 1 Application topically in the morning and at bedtime.         Follow-up Information     [name], MD. Schedule an appointment as soon as possible for a visit in 1 week(s).   Specialty: Internal Medicine Why: Also follow-up with your cardiologist within a week of discharge Contact information: [address] [place] [facility] [address] [phone]                 Major procedures and Radiology Reports - PLEASE review detailed and final reports thoroughly  -     ECHOCARDIOGRAM COMPLETE Result Date: 03/04/2024    ECHOCARDIOGRAM REPORT   Patient Name:   ARICA BEVILACQUA [facility] Date of Exam: 03/04/2024 Medical Rec #:  [id number]         Height:       64.0 in Accession #:    [id number]        Weight:       163.4 lb Date of Birth:  08/24/1940        BSA:          1.795 m Patient Age:    82 years          BP:           152/63 mmHg Patient Gender: F                 HR:           61 bpm. Exam Location:  Inpatient Procedure: 2D Echo, Cardiac Doppler and Color Doppler (Both Spectral and Color            Flow Doppler were utilized during procedure). Indications:    CHF-Acute Diastolic I50.31  History:        Patient has prior history of Echocardiogram examinations, most                 recent 10/15/2022. CHF, Previous Myocardial Infarction,                 Pacemaker, COPD and CKD, stage 3, Arrythmias:Tachycardia and                 Bradycardia, Signs/Symptoms:Hypotension and Chest Pain; Risk                 Factors:Hypertension and Dyslipidemia.   Sonographer:    Thea Norlander RCS Referring Phys: JACQUELINE Ryiah Bellissimo K [facility] IMPRESSIONS  1. Left ventricular ejection fraction, by estimation, is 60 to 65%. The left ventricle has normal function. The left ventricle has no regional wall motion abnormalities. Left ventricular diastolic parameters are indeterminate.  2. Right ventricular systolic function is normal. The right ventricular size is normal.  3. Mild mitral valve regurgitation.  4. The aortic valve is tricuspid. Aortic valve regurgitation is mild.  5. The inferior vena cava is dilated in size with <50% respiratory variability, suggesting right atrial pressure of 15 mmHg. FINDINGS  Left Ventricle: Left ventricular ejection fraction, by estimation, is 60 to 65%. The left ventricle  has normal function. The left ventricle has no regional wall motion abnormalities. The left ventricular internal cavity size was normal in size. There is  no left ventricular hypertrophy. Left ventricular diastolic parameters are indeterminate. Right Ventricle: The right ventricular size is normal. Right vetricular wall thickness was not assessed. Right ventricular systolic function is normal. Left Atrium: Left atrial size was normal in size. Right Atrium: Right atrial size was normal in size. Pericardium: There is no evidence of pericardial effusion. Mitral Valve: There is mild thickening of the mitral valve leaflet(s). Mild mitral annular calcification. Mild mitral valve regurgitation. MV peak gradient, 5.9 mmHg. The mean mitral valve gradient is 2.5 mmHg. Tricuspid Valve: The tricuspid valve is normal in structure. Tricuspid valve regurgitation is mild. Aortic Valve: The aortic valve is tricuspid. Aortic valve regurgitation is mild. Aortic valve peak gradient measures 5.5 mmHg. Pulmonic Valve: The pulmonic valve was not well visualized. Pulmonic valve regurgitation is not visualized. No evidence of pulmonic stenosis. Aorta: The aortic root and ascending aorta are structurally  normal, with no evidence of dilitation. Venous: The inferior vena cava is dilated in size with less than 50% respiratory variability, suggesting right atrial pressure of 15 mmHg.  LEFT VENTRICLE PLAX 2D LVIDd:         5.10 cm   Diastology LVIDs:         3.30 cm   LV e' medial:    2.68 cm/s LV PW:         0.90 cm   LV E/e' medial:  27.1 LV IVS:        1.10 cm   LV e' lateral:   3.71 cm/s LVOT diam:     2.10 cm   LV E/e' lateral: 19.6 LV SV:         78 LV SV Index:   44 LVOT Area:     3.46 cm  RIGHT VENTRICLE            IVC RV S prime:     6.83 cm/s  IVC diam: 2.10 cm TAPSE (M-mode): 1.7 cm LEFT ATRIUM             Index        RIGHT ATRIUM           Index LA diam:        4.40 cm 2.45 cm/m   RA Area:     10.30 cm LA Vol (A2C):   52.4 ml 29.19 ml/m  RA Volume:   17.40 ml  9.69 ml/m LA Vol (A4C):   48.2 ml 26.85 ml/m LA Biplane Vol: 50.0 ml 27.85 ml/m  AORTIC VALVE AV Area (Vmax): 2.79 cm AV Vmax:        117.67 cm/s AV Peak Grad:   5.5 mmHg LVOT Vmax:      94.70 cm/s LVOT Vmean:     60.600 cm/s LVOT VTI:       0.226 m  AORTA Ao Root diam: 2.90 cm Ao Asc diam:  3.50 cm MITRAL VALVE MV Area (PHT): 2.62 cm     SHUNTS MV Area VTI:   2.21 cm     Systemic VTI:  0.23 m MV Peak grad:  5.9 mmHg     Systemic Diam: 2.10 cm MV Mean grad:  2.5 mmHg MV Vmax:       1.21 m/s MV Vmean:      77.0 cm/s MV Decel Time: 289 msec MV E velocity: 72.60 cm/s MV A velocity: 120.00 cm/s MV E/A ratio:  0.60 Vina Gull MD Electronically signed by Vina Gull MD Signature Date/Time: 03/04/2024/3:46:05 PM    Final    DG CHEST PORT 1 VIEW Result Date: 03/03/2024 EXAM: 1 VIEW XRAY OF THE CHEST 03/03/2024 06:26:58 AM COMPARISON: None available. CLINICAL HISTORY: Hypoxia 200808. HYPOXIA,BI-PAP; ROVER FINDINGS: LUNGS AND PLEURA: Interval improvement in previously noted diffuse increased interstitial opacities. Persistent mild left lower lobe opacity may represent atelectasis or airspace disease. No pleural effusion. No pneumothorax. HEART AND  MEDIASTINUM: Stable cardiac enlargement. Left chest wall pacer noted with leads in the right atrial appendage and right ventricle. BONES AND SOFT TISSUES: No acute osseous abnormality. IMPRESSION: 1. Interval improvement in previously noted interstitial edema. 2. Persistent mild left lower lobe opacity, possibly atelectasis or airspace disease. Electronically signed by: Waddell Calk MD 03/03/2024 07:04 AM EDT RP Workstation: [id number]   DG Chest Port 1 View Result Date: 03/02/2024 CLINICAL DATA:  Acute respiratory distress EXAM: PORTABLE CHEST 1 VIEW COMPARISON:  02/29/2024 FINDINGS: Lungs are symmetrically well expanded. No pneumothorax or pleural effusion. Diffuse interstitial pulmonary infiltrate, progressive since prior examination asymmetrically more severe within the left lung, most in keeping with mild asymmetric pulmonary edema. Left subclavian dual lead pacemaker in place with leads within the right atrium and right ventricle. Stable mild cardiomegaly. No acute bone abnormality. IMPRESSION: 1. Mild asymmetric pulmonary edema, progressive since prior examination. 2. Stable mild cardiomegaly. Electronically Signed   By: Dorethia Molt M.D.   On: 03/02/2024 23:00   DG Swallowing Func-Speech Pathology Result Date: 03/01/2024 Table formatting from the original result was not included. Modified Barium Swallow Study Patient Details Name: TEHILA SOKOLOW MRN: [id number] Date of Birth: [birth year] Today's Date: 03/01/2024 HPI/PMH: HPI: ZURISADAI HELMINIAK is an 83 yo female presenting to ED 8/11 with back pain and shortness of breath. Admitted with sepsis secondary to PNA. CTA shows RUL patchy airspace disease consistent with multifocal PNA and new patchy ground-glass disease in the RLL. Recently hospitalized 7/28-7/29 for SIRS with question of UTI vs upper respiratory infection.  SLP consulted due to recurrent PNA. PMH includes COPD, chronic respiratory failure on 4L, HFpEF, CAD, tachybradycardia syndrome s/p  permanent pacemaker, Crohn's disease, SBO, migraine, CKD 3, anxiety Clinical Impression: Clinical Impression: Pt exhibits moderate pharyngeal dysphagia characterized primarily by mistiming and reduced sensation. The swallow is initiated at the pyriform sinuses and epiglottic inversion is incomplete, allowing airway invasion before and during the swallow. This was seen consistently with penetration above the vocal folds until she was challenged with larger sips, which resulted in aspiration with variable sensation (PAS 7, 8). A chin tuck posture did not improve airway protection. Nectar thick liquids result in penetration above the vocal folds that cued intermittent coughing clears (PAS 3). Additionally, retention of barium was noted when the 13 mm tablet was administered with thin liquids. This may contribute to the symptoms of globus and regurgitation that the pt described to this SLP during evalutation previous date (could consider further esophageal assessment). For now, recommend continuing regular diet but with nectar thick liquids pending ongoing education and discussion with the pt and medical team. Prolonged use of thickener is not recommended nor does it prevent aspiration or decrease risk of adverse events that may occur in its presence. Will plan to f/u with MD and pt as able. Factors that may increase risk of adverse event in presence of aspiration Noe & Lianne 2021): Factors that may increase risk of adverse event in presence of aspiration Noe & Lianne 2021): Poor general health and/or  compromised immunity; Respiratory or GI disease; Reduced cognitive function; Frail or deconditioned; Inadequate oral hygiene Recommendations/Plan: Swallowing Evaluation Recommendations Swallowing Evaluation Recommendations Recommendations: PO diet PO Diet Recommendation: Regular; Mildly thick liquids (Level 2, nectar thick) Liquid Administration via: Cup; Straw Medication Administration: Whole meds with puree  Supervision: Patient able to self-feed; Intermittent supervision/cueing for swallowing strategies Swallowing strategies  : Minimize environmental distractions; Slow rate; Small bites/sips Postural changes: Position pt fully upright for meals; Stay upright 30-60 min after meals Oral care recommendations: Oral care QID (4x/day); Oral care before PO Recommended consults: Consider Palliative care Treatment Plan Treatment Plan Treatment recommendations: Therapy as outlined in treatment plan below Follow-up recommendations: Outpatient SLP Functional status assessment: Patient has had a recent decline in their functional status and demonstrates the ability to make significant improvements in function in a reasonable and predictable amount of time. Treatment frequency: Min 2x/week Treatment duration: 2 weeks Interventions: Aspiration precaution training; Patient/family education; Trials of upgraded texture/liquids; Diet toleration management by SLP Recommendations Recommendations for follow up therapy are one component of a multi-disciplinary discharge planning process, led by the attending physician.  Recommendations may be updated based on patient status, additional functional criteria and insurance authorization. Assessment: Orofacial Exam: Orofacial Exam Oral Cavity: Oral Hygiene: WFL Oral Cavity - Dentition: Missing dentition; Poor condition Orofacial Anatomy: WFL Oral Motor/Sensory Function: WFL Anatomy: Anatomy: Suspected cervical osteophytes Boluses Administered: Boluses Administered Boluses Administered: Thin liquids (Level 0); Mildly thick liquids (Level 2, nectar thick); Moderately thick liquids (Level 3, honey thick); Puree; Solid  Oral Impairment Domain: Oral Impairment Domain Lip Closure: No labial escape Tongue control during bolus hold: Cohesive bolus between tongue to palatal seal Bolus preparation/mastication: Timely and efficient chewing and mashing Bolus transport/lingual motion: Brisk tongue motion Oral  residue: Complete oral clearance Location of oral residue : N/A Initiation of pharyngeal swallow : Pyriform sinuses  Pharyngeal Impairment Domain: Pharyngeal Impairment Domain Soft palate elevation: No bolus between soft palate (SP)/pharyngeal wall (PW) Laryngeal elevation: Complete superior movement of thyroid  cartilage with complete approximation of arytenoids to epiglottic petiole Anterior hyoid excursion: Complete anterior movement Epiglottic movement: Partial inversion Laryngeal vestibule closure: Incomplete, narrow column air/contrast in laryngeal vestibule Pharyngeal stripping wave : Present - complete Pharyngeal contraction (A/P view only): N/A Pharyngoesophageal segment opening: Complete distension and complete duration, no obstruction of flow Tongue base retraction: No contrast between tongue base and posterior pharyngeal wall (PPW) Pharyngeal residue: Complete pharyngeal clearance Location of pharyngeal residue: N/A  Esophageal Impairment Domain: Esophageal Impairment Domain Esophageal clearance upright position: Esophageal retention Pill: Pill Consistency administered: Thin liquids (Level 0) Thin liquids (Level 0): Impaired (see clinical impressions) Penetration/Aspiration Scale Score: Penetration/Aspiration Scale Score 1.  Material does not enter airway: Solid; Pill 2.  Material enters airway, remains ABOVE vocal cords then ejected out: Moderately thick liquids (Level 3, honey thick); Puree 3.  Material enters airway, remains ABOVE vocal cords and not ejected out: Mildly thick liquids (Level 2, nectar thick) Compensatory Strategies: Compensatory Strategies Compensatory strategies: Yes Chin tuck: Ineffective Ineffective Chin Tuck: Thin liquid (Level 0)   General Information: Caregiver present: No  Diet Prior to this Study: Regular; Thin liquids (Level 0)   Temperature : Normal   Respiratory Status: WFL   Supplemental O2: Nasal cannula   History of Recent Intubation: No  Behavior/Cognition: Alert;  Cooperative Self-Feeding Abilities: Able to self-feed Baseline vocal quality/speech: Normal Volitional Cough: Able to elicit Volitional Swallow: Able to elicit Exam Limitations: No limitations Goal Planning: Prognosis for improved oropharyngeal function: Fair Barriers to  Reach Goals: Time post onset No data recorded Patient/Family Stated Goal: none stated Consulted and agree with results and recommendations: Patient; Physician Pain: Pain Assessment Pain Assessment: No/denies pain End of Session: Start Time:SLP Start Time (ACUTE ONLY): 1505 Stop Time: SLP Stop Time (ACUTE ONLY): 1524 Time Calculation:SLP Time Calculation (min) (ACUTE ONLY): 19 min Charges: SLP Evaluations $ SLP Speech Visit: 1 Visit SLP Evaluations $BSS Swallow: 1 Procedure $MBS Swallow: 1 Procedure $Swallowing Treatment: 1 Procedure SLP visit diagnosis: SLP Visit Diagnosis: Dysphagia, pharyngoesophageal phase (R13.14) Past Medical History: Past Medical History: Diagnosis Date  Acute CHF (congestive heart failure) (HCC) 01/02/2022  AKI (acute kidney injury) (HCC) 09/08/2020  Aspiration pneumonia (HCC) 01/05/2019  Atypical chest pain 03/11/2022  Bradycardia 03/14/2020  C. difficile colitis 04/03/2021  CHF (congestive heart failure) (HCC)   COPD (chronic obstructive pulmonary disease) (HCC)   Coronary artery disease   Tachycardia 05/31/2013 Past Surgical History: Past Surgical History: Procedure Laterality Date  ABDOMINAL HYSTERECTOMY    BOWEL RESECTION    CHOLECYSTECTOMY   Damien Blumenthal, M.A., CCC-SLP Speech Language Pathology, Acute Rehabilitation Services Secure Chat preferred [phone] 03/01/2024, 4:37 PM  DG Chest Port 1 View Result Date: 02/29/2024 EXAM: 1 VIEW XRAY OF THE CHEST 02/29/2024 06:15:00 AM COMPARISON: 02/28/2024 CLINICAL HISTORY: Shortness of breath. FINDINGS: LUNGS AND PLEURA: New blunting of the left costophrenic angle, which may reflect a small effusion. Mild diffuse increase interstitial markings. Bibasilar atelectasis.  Decreased lung volumes. HEART AND MEDIASTINUM: Stable cardiac enlargement. Left chest wall pacer noted with leads in the right atrial appendage and right ventricle. BONES AND SOFT TISSUES: Aortic atherosclerotic calcification. IMPRESSION: 1. Suspect new small left pleural effusion with increased interstitial markings suggestive of mild chf. 2. Low lung volumes with Bibasilar Atelectasis. 3. Stable cardiac enlargement with left chest wall pacer in place. Electronically signed by: Waddell Calk MD 02/29/2024 08:00 AM EDT RP Workstation: [id number]   CT HEAD WO CONTRAST ( ) Result Date: 02/28/2024 CLINICAL DATA:  Delirium, shortness of breath, back pain, chest pain, and sepsis. EXAM: CT HEAD WITHOUT CONTRAST CT ANGIOGRAPHY CHEST CT ABDOMEN AND PELVIS WITH CONTRAST TECHNIQUE: Contiguous axial images were obtained from the base of the skull through the vertex without intravenous contrast. Multiplanar reconstructions were created and reviewed. Multidetector CT imaging of the chest was performed using the standard protocol during bolus administration of intravenous contrast. Multiplanar CT image reconstructions and MIPs were obtained to evaluate the vascular anatomy. Multidetector CT imaging of the abdomen and pelvis was performed using the standard protocol during bolus administration of intravenous contrast. RADIATION DOSE REDUCTION: This exam was performed according to the departmental dose-optimization program which includes automated exposure control, adjustment of the mA and/or kV according to patient size and/or use of iterative reconstruction technique. CONTRAST:  75mL OMNIPAQUE  IOHEXOL  350 MG/ML SOLN COMPARISON:  Head CT 02/14/2024, portable chest today, portable chest 02/14/2024, chest CT 09/30/2023, abdomen and pelvis CT with contrast 01/02/2022, and abdomen and pelvis CT without contrast 08/11/2023. FINDINGS: CT HEAD WITHOUT CONTRAST FINDINGS Brain: There is mild atrophy and small-vessel disease. The  ventricles are normal in size and position. No acute cortical based infarct, hemorrhage, mass or mass effect is seen. Basal cisterns are clear. Vascular: No hyperdense vessels or unexpected calcification. Skull: Negative for fractures or focal lesions. Sinuses/orbits: No acute findings. Old lens extractions. Clear sinuses and mastoids. Other: None. CTA CHEST FINDINGS Cardiovascular: Left chest dual lead pacing system again noted, unchanged. No significant pericardial fluid. Mild-to-moderate panchamber cardiomegaly. 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 There is atherosclerosis moderately in the aorta, scattered 2 vessel coronary calcifications LAD and circumflex, scattered plaque in the great vessels. There is no aortic aneurysm, stenosis or dissection. There is an enlarged pulmonary trunk, unchanged measuring 3.7 cm indicating arterial hypertension. Arterial opacification is diagnostic. No arterial embolism is seen. No venous dilatation. Mediastinum/Nodes: There are a few bilateral tiny hypodense thyroid  nodules. No follow-up imaging is recommended. Axillary spaces are clear. There stable enlarged right mid hilar nodes to 1.3 cm in short axis, stable mediastinal adenopathy with right paratracheal nodes to 1.3 cm, single prevascular node 0.8 cm, subcarinal nodes up to 1.4 cm. No new or progressive adenopathy is seen. The thoracic trachea, main bronchi, and thoracic esophagus are unremarkable. Small hiatal hernia. Lungs/Pleura: Both lungs are moderately emphysematous with centrilobular changes predominating. There is no pleural effusion. Diffuse bronchial thickening. Chronic atelectasis again noted in the lateral segment right middle lobe. There is patchy airspace disease in the right upper lobe consistent with multifocal pneumonia. There are coarse atelectatic changes in both lower lobes and a few linear scar-like opacities both apices and bases. There is new  patchy ground-glass disease in the right lower lobe favored to be pneumonitis, versus asymmetric atelectasis. There is a new 5 mm rounded nodule in the posteromedial base of the right lower lobe on 9:97 probably an inflammatory nodule. Stable 3 mm right lower lobe nodule on 9:113. There is a stable 4 mm left upper lobe nodule laterally on 9:42. Musculoskeletal: There is osteopenia, kyphosis and degenerative change of the thoracic spine. No acute or significant osseous findings. Review of the MIP images confirms the above findings. CT ABDOMEN and PELVIS FINDINGS Hepatobiliary: No focal liver abnormality is seen. Status post cholecystectomy. There is chronic mild prominence of the intrahepatic and extrahepatic bile ducts. No obstructing stone is seen. Pancreas: Moderately atrophic.  Otherwise unremarkable. Spleen: No abnormality.  Small splenule at the hilum. Adrenals/Urinary Tract: No adrenal mass. Three Bosniak 1 left renal cysts are again noted, largest is in the upper pole measuring 2.8 cm. There are occasional bilateral Bosniak 2 subcentimeter cortical cysts for which are too small to characterize. No follow-up imaging is recommended. There is no urinary stone or obstruction. There is symmetric excretion on delayed images. The bladder is unremarkable for the degree of distension. Stomach/Bowel: Chronic thickened folds in the stomach. Normal caliber small bowel. Right hemicolectomy with primary end to side anastomosis again is noted. There is scattered colonic diverticulosis evidence of colitis or diverticulitis. Vascular/Lymphatic: Aortic atherosclerosis. No enlarged abdominal or pelvic lymph nodes. Reproductive: Status post hysterectomy. No adnexal masses. Other: Pelvic floor laxity.  No cystocele. Musculoskeletal: Osteopenia and degenerative change lumbar spine. No acute or significant osseous findings. Mild lumbar dextroscoliosis. Review of the MIP images confirms the above findings. IMPRESSION: 1. No acute  intracranial CT findings or interval changes. Stable exam. 2. 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 3. Enlarged pulmonary trunk indicating arterial hypertension. No arterial embolus is seen. 4. Right upper lobe patchy airspace disease consistent with multifocal pneumonia. 5. New patchy ground-glass disease in the right lower lobe favored to be pneumonitis, versus asymmetric atelectasis. 6. New 5 mm rounded nodule in the posteromedial base of the right lower lobe, probably an inflammatory nodule. Follow-up study recommended after treatment of pneumonia. 7. Stable mediastinal and right hilar adenopathy. 8. Emphysema and bronchitis. 9. No acute findings in the abdomen or pelvis. 10. Chronic thickened folds in the stomach. 11. Diverticulosis without evidence  of diverticulitis. 12. Pelvic floor laxity. 13. Osteopenia and degenerative change. Aortic Atherosclerosis (ICD10-I70.0) and Emphysema (ICD10-J43.9). Electronically Signed   By: Francis Quam M.D.   On: 02/28/2024 06:03   CT Angio Chest Pulmonary Embolism (PE) W or WO Contrast Result Date: 02/28/2024 CLINICAL DATA:  Delirium, shortness of breath, back pain, chest pain, and sepsis. EXAM: CT HEAD WITHOUT CONTRAST CT ANGIOGRAPHY CHEST CT ABDOMEN AND PELVIS WITH CONTRAST TECHNIQUE: Contiguous axial images were obtained from the base of the skull through the vertex without intravenous contrast. Multiplanar reconstructions were created and reviewed. Multidetector CT imaging of the chest was performed using the standard protocol during bolus administration of intravenous contrast. Multiplanar CT image reconstructions and MIPs were obtained to evaluate the vascular anatomy. Multidetector CT imaging of the abdomen and pelvis was performed using the standard protocol during bolus administration of intravenous contrast. RADIATION DOSE REDUCTION: This exam was performed according to the departmental dose-optimization program which includes automated  exposure control, adjustment of the mA and/or kV according to patient size and/or use of iterative reconstruction technique. CONTRAST:  75mL OMNIPAQUE  IOHEXOL  350 MG/ML SOLN COMPARISON:  Head CT 02/14/2024, portable chest today, portable chest 02/14/2024, chest CT 09/30/2023, abdomen and pelvis CT with contrast 01/02/2022, and abdomen and pelvis CT without contrast 08/11/2023. FINDINGS: CT HEAD WITHOUT CONTRAST FINDINGS Brain: There is mild atrophy and small-vessel disease. The ventricles are normal in size and position. No acute cortical based infarct, hemorrhage, mass or mass effect is seen. Basal cisterns are clear. Vascular: No hyperdense vessels or unexpected calcification. Skull: Negative for fractures or focal lesions. Sinuses/orbits: No acute findings. Old lens extractions. Clear sinuses and mastoids. Other: None. CTA CHEST FINDINGS Cardiovascular: Left chest dual lead pacing system again noted, unchanged. No significant pericardial fluid. Mild-to-moderate panchamber cardiomegaly. 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 There is atherosclerosis moderately in the aorta, scattered 2 vessel coronary calcifications LAD and circumflex, scattered plaque in the great vessels. There is no aortic aneurysm, stenosis or dissection. There is an enlarged pulmonary trunk, unchanged measuring 3.7 cm indicating arterial hypertension. Arterial opacification is diagnostic. No arterial embolism is seen. No venous dilatation. Mediastinum/Nodes: There are a few bilateral tiny hypodense thyroid  nodules. No follow-up imaging is recommended. Axillary spaces are clear. There stable enlarged right mid hilar nodes to 1.3 cm in short axis, stable mediastinal adenopathy with right paratracheal nodes to 1.3 cm, single prevascular node 0.8 cm, subcarinal nodes up to 1.4 cm. No new or progressive adenopathy is seen. The thoracic trachea, main bronchi, and thoracic esophagus are  unremarkable. Small hiatal hernia. Lungs/Pleura: Both lungs are moderately emphysematous with centrilobular changes predominating. There is no pleural effusion. Diffuse bronchial thickening. Chronic atelectasis again noted in the lateral segment right middle lobe. There is patchy airspace disease in the right upper lobe consistent with multifocal pneumonia. There are coarse atelectatic changes in both lower lobes and a few linear scar-like opacities both apices and bases. There is new patchy ground-glass disease in the right lower lobe favored to be pneumonitis, versus asymmetric atelectasis. There is a new 5 mm rounded nodule in the posteromedial base of the right lower lobe on 9:97 probably an inflammatory nodule. Stable 3 mm right lower lobe nodule on 9:113. There is a stable 4 mm left upper lobe nodule laterally on 9:42. Musculoskeletal: There is osteopenia, kyphosis and degenerative change of the thoracic spine. No acute or significant osseous findings. Review of the MIP images confirms the above  findings. CT ABDOMEN and PELVIS FINDINGS Hepatobiliary: No focal liver abnormality is seen. Status post cholecystectomy. There is chronic mild prominence of the intrahepatic and extrahepatic bile ducts. No obstructing stone is seen. Pancreas: Moderately atrophic.  Otherwise unremarkable. Spleen: No abnormality.  Small splenule at the hilum. Adrenals/Urinary Tract: No adrenal mass. Three Bosniak 1 left renal cysts are again noted, largest is in the upper pole measuring 2.8 cm. There are occasional bilateral Bosniak 2 subcentimeter cortical cysts for which are too small to characterize. No follow-up imaging is recommended. There is no urinary stone or obstruction. There is symmetric excretion on delayed images. The bladder is unremarkable for the degree of distension. Stomach/Bowel: Chronic thickened folds in the stomach. Normal caliber small bowel. Right hemicolectomy with primary end to side anastomosis again is noted.  There is scattered colonic diverticulosis evidence of colitis or diverticulitis. Vascular/Lymphatic: Aortic atherosclerosis. No enlarged abdominal or pelvic lymph nodes. Reproductive: Status post hysterectomy. No adnexal masses. Other: Pelvic floor laxity.  No cystocele. Musculoskeletal: Osteopenia and degenerative change lumbar spine. No acute or significant osseous findings. Mild lumbar dextroscoliosis. Review of the MIP images confirms the above findings. IMPRESSION: 1. No acute intracranial CT findings or interval changes. Stable exam. 2. 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 3. Enlarged pulmonary trunk indicating arterial hypertension. No arterial embolus is seen. 4. Right upper lobe patchy airspace disease consistent with multifocal pneumonia. 5. New patchy ground-glass disease in the right lower lobe favored to be pneumonitis, versus asymmetric atelectasis. 6. New 5 mm rounded nodule in the posteromedial base of the right lower lobe, probably an inflammatory nodule. Follow-up study recommended after treatment of pneumonia. 7. Stable mediastinal and right hilar adenopathy. 8. Emphysema and bronchitis. 9. No acute findings in the abdomen or pelvis. 10. Chronic thickened folds in the stomach. 11. Diverticulosis without evidence of diverticulitis. 12. Pelvic floor laxity. 13. Osteopenia and degenerative change. Aortic Atherosclerosis (ICD10-I70.0) and Emphysema (ICD10-J43.9). Electronically Signed   By: Francis Quam M.D.   On: 02/28/2024 06:03   CT ABDOMEN PELVIS W CONTRAST Result Date: 02/28/2024 CLINICAL DATA:  Delirium, shortness of breath, back pain, chest pain, and sepsis. EXAM: CT HEAD WITHOUT CONTRAST CT ANGIOGRAPHY CHEST CT ABDOMEN AND PELVIS WITH CONTRAST TECHNIQUE: Contiguous axial images were obtained from the base of the skull through the vertex without intravenous contrast. Multiplanar reconstructions were created and reviewed. Multidetector CT imaging of the chest was  performed using the standard protocol during bolus administration of intravenous contrast. Multiplanar CT image reconstructions and MIPs were obtained to evaluate the vascular anatomy. Multidetector CT imaging of the abdomen and pelvis was performed using the standard protocol during bolus administration of intravenous contrast. RADIATION DOSE REDUCTION: This exam was performed according to the departmental dose-optimization program which includes automated exposure control, adjustment of the mA and/or kV according to patient size and/or use of iterative reconstruction technique. CONTRAST:  75mL OMNIPAQUE  IOHEXOL  350 MG/ML SOLN COMPARISON:  Head CT 02/14/2024, portable chest today, portable chest 02/14/2024, chest CT 09/30/2023, abdomen and pelvis CT with contrast 01/02/2022, and abdomen and pelvis CT without contrast 08/11/2023. FINDINGS: CT HEAD WITHOUT CONTRAST FINDINGS Brain: There is mild atrophy and small-vessel disease. The ventricles are normal in size and position. No acute cortical based infarct, hemorrhage, mass or mass effect is seen. Basal cisterns are clear. Vascular: No hyperdense vessels or unexpected calcification. Skull: Negative for fractures or focal lesions. Sinuses/orbits: No acute findings. Old lens extractions. Clear sinuses and mastoids. Other: None. CTA CHEST FINDINGS Cardiovascular: Left chest  dual lead pacing system again noted, unchanged. No significant pericardial fluid. Mild-to-moderate panchamber cardiomegaly. 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 There is atherosclerosis moderately in the aorta, scattered 2 vessel coronary calcifications LAD and circumflex, scattered plaque in the great vessels. There is no aortic aneurysm, stenosis or dissection. There is an enlarged pulmonary trunk, unchanged measuring 3.7 cm indicating arterial hypertension. Arterial opacification is diagnostic. No arterial embolism is seen. No venous dilatation.  Mediastinum/Nodes: There are a few bilateral tiny hypodense thyroid  nodules. No follow-up imaging is recommended. Axillary spaces are clear. There stable enlarged right mid hilar nodes to 1.3 cm in short axis, stable mediastinal adenopathy with right paratracheal nodes to 1.3 cm, single prevascular node 0.8 cm, subcarinal nodes up to 1.4 cm. No new or progressive adenopathy is seen. The thoracic trachea, main bronchi, and thoracic esophagus are unremarkable. Small hiatal hernia. Lungs/Pleura: Both lungs are moderately emphysematous with centrilobular changes predominating. There is no pleural effusion. Diffuse bronchial thickening. Chronic atelectasis again noted in the lateral segment right middle lobe. There is patchy airspace disease in the right upper lobe consistent with multifocal pneumonia. There are coarse atelectatic changes in both lower lobes and a few linear scar-like opacities both apices and bases. There is new patchy ground-glass disease in the right lower lobe favored to be pneumonitis, versus asymmetric atelectasis. There is a new 5 mm rounded nodule in the posteromedial base of the right lower lobe on 9:97 probably an inflammatory nodule. Stable 3 mm right lower lobe nodule on 9:113. There is a stable 4 mm left upper lobe nodule laterally on 9:42. Musculoskeletal: There is osteopenia, kyphosis and degenerative change of the thoracic spine. No acute or significant osseous findings. Review of the MIP images confirms the above findings. CT ABDOMEN and PELVIS FINDINGS Hepatobiliary: No focal liver abnormality is seen. Status post cholecystectomy. There is chronic mild prominence of the intrahepatic and extrahepatic bile ducts. No obstructing stone is seen. Pancreas: Moderately atrophic.  Otherwise unremarkable. Spleen: No abnormality.  Small splenule at the hilum. Adrenals/Urinary Tract: No adrenal mass. Three Bosniak 1 left renal cysts are again noted, largest is in the upper pole measuring 2.8 cm.  There are occasional bilateral Bosniak 2 subcentimeter cortical cysts for which are too small to characterize. No follow-up imaging is recommended. There is no urinary stone or obstruction. There is symmetric excretion on delayed images. The bladder is unremarkable for the degree of distension. Stomach/Bowel: Chronic thickened folds in the stomach. Normal caliber small bowel. Right hemicolectomy with primary end to side anastomosis again is noted. There is scattered colonic diverticulosis evidence of colitis or diverticulitis. Vascular/Lymphatic: Aortic atherosclerosis. No enlarged abdominal or pelvic lymph nodes. Reproductive: Status post hysterectomy. No adnexal masses. Other: Pelvic floor laxity.  No cystocele. Musculoskeletal: Osteopenia and degenerative change lumbar spine. No acute or significant osseous findings. Mild lumbar dextroscoliosis. Review of the MIP images confirms the above findings. IMPRESSION: 1. No acute intracranial CT findings or interval changes. Stable exam. 2. 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 3. Enlarged pulmonary trunk indicating arterial hypertension. No arterial embolus is seen. 4. Right upper lobe patchy airspace disease consistent with multifocal pneumonia. 5. New patchy ground-glass disease in the right lower lobe favored to be pneumonitis, versus asymmetric atelectasis. 6. New 5 mm rounded nodule in the posteromedial base of the right lower lobe, probably an inflammatory nodule. Follow-up study recommended after treatment of pneumonia. 7. Stable mediastinal and right hilar adenopathy. 8.  Emphysema and bronchitis. 9. No acute findings in the abdomen or pelvis. 10. Chronic thickened folds in the stomach. 11. Diverticulosis without evidence of diverticulitis. 12. Pelvic floor laxity. 13. Osteopenia and degenerative change. Aortic Atherosclerosis (ICD10-I70.0) and Emphysema (ICD10-J43.9). Electronically Signed   By: Francis Quam M.D.   On: 02/28/2024 06:03    DG Chest Port 1 View Result Date: 02/28/2024 CLINICAL DATA:  Shortness of breath EXAM: PORTABLE CHEST 1 VIEW COMPARISON:  02/14/2024 FINDINGS: Cardiac shadow is enlarged but stable. Pacing device is again seen. Lungs are well aerated bilaterally. No focal infiltrate or sizable effusion is noted. IMPRESSION: No acute abnormality noted. Electronically Signed   By: Oneil Devonshire M.D.   On: 02/28/2024 03:42   CT HEAD WO CONTRAST ( ) Result Date: 02/14/2024 CLINICAL DATA:  Mental status change, unknown cause EXAM: CT HEAD WITHOUT CONTRAST TECHNIQUE: Contiguous axial images were obtained from the base of the skull through the vertex without intravenous contrast. RADIATION DOSE REDUCTION: This exam was performed according to the departmental dose-optimization program which includes automated exposure control, adjustment of the mA and/or kV according to patient size and/or use of iterative reconstruction technique. COMPARISON:  September 05, 2023. FINDINGS: Brain: No evidence of acute infarction, hemorrhage, hydrocephalus, extra-axial collection or mass lesion/mass effect. Vascular: No hyperdense vessel or unexpected calcification. Skull: No acute fracture. Sinuses/Orbits: Clear sinuses.  No acute orbital findings. Other: No mastoid effusions. IMPRESSION: No evidence of acute intracranial abnormality. Electronically Signed   By: Gilmore GORMAN Molt M.D.   On: 02/14/2024 19:38   DG Chest Port 1 View Result Date: 02/14/2024 CLINICAL DATA:  Altered mental status and possible sepsis EXAM: PORTABLE CHEST 1 VIEW COMPARISON:  09/30/2023 FINDINGS: Cardiac shadow is enlarged. Pacing device is again seen. Previously seen nodular densities in the right base are no longer identified. No focal infiltrate or effusion is seen. Mild interstitial thickening is noted. No bony abnormality is seen. IMPRESSION: Previously seen nodular densities are no longer identified in the right base. Mild interstitial thickening is noted which may  be related to edema. Electronically Signed   By: Oneil Devonshire M.D.   On: 02/14/2024 03:49    Micro Results   Recent Results (from the past 240 hours)  Blood Culture (routine x 2)     Status: None   Collection Time: 02/28/24  2:28 AM   Specimen: BLOOD  Result Value Ref Range Status   Specimen Description BLOOD LEFT ANTECUBITAL  Final   Special Requests   Final    BOTTLES DRAWN AEROBIC AND ANAEROBIC Blood Culture results may not be optimal due to an inadequate volume of blood received in culture bottles   Culture   Final    NO GROWTH 5 DAYS Performed at [facility], [address], [place], [place] [address]    Report Status 03/04/2024 FINAL  Final  Resp panel by RT-PCR (RSV, Flu A&B, Covid) Anterior Nasal Swab     Status: None   Collection Time: 02/28/24  2:31 AM   Specimen: Anterior Nasal Swab  Result Value Ref Range Status   SARS Coronavirus 2 by RT PCR NEGATIVE NEGATIVE Final   Influenza A by PCR NEGATIVE NEGATIVE Final   Influenza B by PCR NEGATIVE NEGATIVE Final    Comment: (NOTE) The Xpert Xpress SARS-CoV-2/FLU/RSV plus assay is intended as an aid in the diagnosis of influenza from Nasopharyngeal swab specimens and should not be used as a sole basis for treatment. Nasal washings and aspirates are unacceptable for Xpert Xpress  SARS-CoV-2/FLU/RSV testing.  Fact Sheet for Patients: BloggerCourse.com  Fact Sheet for Healthcare Providers: SeriousBroker.it  This test is not yet approved or cleared by the [place]  FDA and has been authorized for detection and/or diagnosis of SARS-CoV-2 by FDA under an Emergency Use Authorization (EUA). This EUA will remain in effect (meaning this test can be used) for the duration of the COVID-19 declaration under Section 564(b)(1) of the Act, 21 U.S.C. section 360bbb-3(b)(1), unless the authorization is terminated or revoked.     Resp Syncytial Virus by PCR NEGATIVE NEGATIVE  Final    Comment: (NOTE) Fact Sheet for Patients: BloggerCourse.com  Fact Sheet for Healthcare Providers: SeriousBroker.it  This test is not yet approved or cleared by the [place]  FDA and has been authorized for detection and/or diagnosis of SARS-CoV-2 by FDA under an Emergency Use Authorization (EUA). This EUA will remain in effect (meaning this test can be used) for the duration of the COVID-19 declaration under Section 564(b)(1) of the Act, 21 U.S.C. section 360bbb-3(b)(1), unless the authorization is terminated or revoked.  Performed at [facility], [address] [place], [place] [address]   Blood Culture (routine x 2)     Status: None   Collection Time: 02/28/24  2:33 AM   Specimen: BLOOD  Result Value Ref Range Status   Specimen Description BLOOD BLOOD RIGHT ARM  Final   Special Requests   Final    BOTTLES DRAWN AEROBIC ONLY Blood Culture results may not be optimal due to an inadequate volume of blood received in culture bottles   Culture   Final    NO GROWTH 5 DAYS Performed at [facility], [address] [place], [place] [address]    Report Status 03/04/2024 FINAL  Final  MRSA Next Gen by PCR, Nasal     Status: None   Collection Time: 02/29/24  5:46 AM   Specimen: Nasal Mucosa; Nasal Swab  Result Value Ref Range Status   MRSA by PCR Next Gen NOT DETECTED NOT DETECTED Final    Comment: (NOTE) The GeneXpert MRSA Assay (FDA approved for NASAL specimens only), is one component of a comprehensive MRSA colonization surveillance program. It is not intended to diagnose MRSA infection nor to guide or monitor treatment for MRSA infections. Test performance is not FDA approved in patients less than 51 years old. Performed at [facility], [address] [place], [place] [address]   Respiratory (~20 pathogens) panel by PCR     Status: None   Collection Time: 03/02/24 10:49 PM   Specimen:  Nasopharyngeal Swab; Respiratory  Result Value Ref Range Status   Adenovirus NOT DETECTED NOT DETECTED Final   Coronavirus 229E NOT DETECTED NOT DETECTED Final    Comment: (NOTE) The Coronavirus on the Respiratory Panel, DOES NOT test for the novel  Coronavirus (2019 nCoV)    Coronavirus HKU1 NOT DETECTED NOT DETECTED Final   Coronavirus NL63 NOT DETECTED NOT DETECTED Final   Coronavirus OC43 NOT DETECTED NOT DETECTED Final   Metapneumovirus NOT DETECTED NOT DETECTED Final   Rhinovirus / Enterovirus NOT DETECTED NOT DETECTED Final   Influenza A NOT DETECTED NOT DETECTED Final   Influenza B NOT DETECTED NOT DETECTED Final   Parainfluenza Virus 1 NOT DETECTED NOT DETECTED Final   Parainfluenza Virus 2 NOT DETECTED NOT DETECTED Final   Parainfluenza Virus 3 NOT DETECTED NOT DETECTED Final   Parainfluenza Virus 4 NOT DETECTED NOT DETECTED Final   Respiratory Syncytial Virus NOT DETECTED  NOT DETECTED Final   Bordetella pertussis NOT DETECTED NOT DETECTED Final   Bordetella Parapertussis NOT DETECTED NOT DETECTED Final   Chlamydophila pneumoniae NOT DETECTED NOT DETECTED Final   Mycoplasma pneumoniae NOT DETECTED NOT DETECTED Final    Comment: Performed at [facility], [address] [place], [place] [address]    Today   Subjective    Lynasia Meloche today has no headache,no chest abdominal pain,no new weakness tingling or numbness, feels much better wants to go home today.    Objective   Blood pressure 122/85, pulse 61, temperature 97.6 F (36.4 C), temperature source Oral, resp. rate 18, height 5' 4 (1.626 m), weight 72.8 kg, SpO2 95%.   Intake/Output Summary (Last 24 hours) at 03/05/2024 0756 Last data filed at 03/04/2024 2120 Gross per 24 hour  Intake 120 ml  Output --  Net 120 ml    Exam  Awake Alert, No new F.N deficits,    [place].AT,PERRAL Supple Neck,   Symmetrical Chest wall movement, Good air movement bilaterally, CTAB RRR,No Gallops,   +ve B.Sounds, Abd  Soft, Non tender,  No Cyanosis, Clubbing or edema    Data Review   Recent Labs  Lab 02/28/24 0228 02/29/24 0518 03/02/24 0518 03/03/24 0831  WBC 12.4* 5.0 4.7 4.9  HGB 13.4 10.5* 10.2* 12.6  HCT 41.6 32.8* 32.5* 39.8  PLT 224 162 167 209  MCV 91.2 92.4 93.1 91.9  MCH 29.4 29.6 29.2 29.1  MCHC 32.2 32.0 31.4 31.7  RDW 14.2 14.4 14.3 14.3  LYMPHSABS 0.7  --  0.8 0.3*  MONOABS 0.6  --  0.4 0.1  EOSABS 0.2  --  0.2 0.0  BASOSABS 0.1  --  0.0 0.0    Recent Labs  Lab 02/28/24 0228 02/28/24 0256 02/28/24 0258 02/28/24 0453 02/28/24 0526 02/29/24 0518 02/29/24 0544 02/29/24 0916 03/02/24 0518 03/03/24 0831 03/04/24 0613 03/04/24 1002 03/05/24 0540  NA 142  --   --   --   --  139  --   --  138 139  --  139 140  K 4.0  --   --   --   --  4.1  --   --  4.6 4.5  --  5.0 4.3  CL 100  --   --   --   --  103  --   --  104 105  --  102 100  CO2 28  --   --   --   --  27  --   --  28 24  --  24 27  ANIONGAP 14  --   --   --   --  9  --   --  6 10  --  13 13  GLUCOSE 129*  --   --   --   --  110*  --   --  99 132*  --  118* 95  BUN 27*  --   --   --   --  20  --   --  19 30*  --  42* 52*  CREATININE 1.36*  --   --   --   --  1.08*  --   --  1.04* 1.24*  --  1.19* 1.33*  AST 35  --   --   --   --   --   --   --   --   --   --   --   --  ALT 12  --   --   --   --   --   --   --   --   --   --   --   --   ALKPHOS 40  --   --   --   --   --   --   --   --   --   --   --   --   BILITOT 0.8  --   --   --   --   --   --   --   --   --   --   --   --   ALBUMIN 4.0  --   --   --   --   --   --   --   --   --   --   --   --   CRP  --   --   --   --   --   --   --  4.3*  --  0.6  --   --   --   PROCALCITON  --   --   --  <0.10  --  <0.10  --   --   --  <0.10  --   --   --   LATICACIDVEN  --  1.9  --   --  1.9  --   --   --   --   --   --   --   --   INR 1.2  --   --   --   --   --   --   --   --   --   --   --   --   BNP  --   --  428.4*  --   --   --  230.3*  --   --  1,045.8*  --   --    --   MG  --   --   --  1.5*  --  1.6*  --   --  2.0 1.9 2.0  --   --   PHOS  --   --   --  3.3  --   --   --   --   --   --   --   --   --   CALCIUM 9.5  --   --   --   --  8.6*  --   --  8.8* 9.3  --  9.4 9.6    Total Time in preparing paper work, data evaluation and todays exam - 35 minutes  Signature  -    Lavada Stank M.D on 03/05/2024 at 7:56 AM   -  To page go to [email or web address]

## 2024-03-05 NOTE — Progress Notes (Signed)
 Daily Progress Note   Patient Name: Brittany Irwin       Date: 03/05/2024 DOB: 08/31/1940  Age: 83 y.o. MRN#: [id number] Attending Physician: Dennise Lavada POUR, MD Primary Care Physician: [name], MD Admit Date: 02/28/2024  Reason for Consultation/Follow-up: Establishing goals of care  Length of Stay: 6  Current Medications: Scheduled Meds:   ALPRAZolam   1 mg Oral BID   apixaban   5 mg Oral BID   arformoterol   15 mcg Nebulization BID   atenolol   25 mg Oral Daily   budesonide  (PULMICORT ) nebulizer solution  0.5 mg Nebulization BID   Chlorhexidine  Gluconate Cloth  6 each Topical Daily   diclofenac  Sodium  2 g Topical QID   doxycycline   100 mg Oral Q12H   ezetimibe   10 mg Oral QHS   fenofibrate   160 mg Oral Daily   furosemide   20 mg Intravenous Once   guaiFENesin   600 mg Oral BID   pantoprazole   40 mg Oral Daily   pregabalin   50 mg Oral BID   revefenacin   175 mcg Nebulization Daily   sodium chloride  flush  3 mL Intravenous Q12H    Continuous Infusions:  cefTRIAXone  (ROCEPHIN )  IV 1 g (03/04/24 0959)    PRN Meds: acetaminophen  **OR** acetaminophen , cyclobenzaprine , ipratropium-albuterol , melatonin, ondansetron  (ZOFRAN ) IV, oxyCODONE -acetaminophen , traMADol , trimethobenzamide   Physical Exam Vitals reviewed.  Constitutional:      General: She is not in acute distress. HENT:     Head: Normocephalic and atraumatic.  Cardiovascular:     Rate and Rhythm: Normal rate.  Pulmonary:     Effort: Pulmonary effort is normal.  Skin:    General: Skin is warm and dry.  Neurological:     Mental Status: She is alert and oriented to person, place, and time.  Psychiatric:        Mood and Affect: Mood normal.        Behavior: Behavior normal.             Vital Signs: BP  122/85 (BP Location: Right Arm)   Pulse 61   Temp 97.6 F (36.4 C) (Oral)   Resp 18   Ht 5' 4 (1.626 m)   Wt 72.8 kg   SpO2 95%   BMI 27.53 kg/m  SpO2: SpO2: 95 % O2 Device: O2 Device: Nasal Cannula O2  Flow Rate: O2 Flow Rate (L/min): 4 L/min    Patient Active Problem List   Diagnosis Date Noted   Acute on chronic respiratory failure with hypoxia (HCC) 03/02/2024   Multifocal pneumonia 03/02/2024   Elevated troponin 02/28/2024   Acute kidney injury superimposed on chronic kidney disease (HCC) 02/14/2024   Acute metabolic encephalopathy 02/14/2024   Abnormal urinalysis 02/14/2024   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) 02/14/2024   Neuropathy 02/14/2024   Hyperlipidemia 02/14/2024   GERD (gastroesophageal reflux disease) 02/14/2024   Sepsis due to pneumonia (HCC) 09/30/2023   COVID 09/06/2023   COVID-19 virus infection 09/05/2023   Sepsis without acute organ dysfunction (HCC) 09/05/2023   Acute on chronic hypoxic respiratory failure (HCC) 09/05/2023   Dehydration 09/05/2023   Hypotension due to hypovolemia 09/05/2023   SBO (small bowel obstruction) (HCC) 08/11/2023   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   Presence of permanent cardiac pacemaker 08/11/2023   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) 10/15/2022   Chest pain, rule out acute myocardial infarction 10/14/2022   Chronic respiratory failure with hypoxia (HCC) 03/12/2022   Essential hypertension 01/03/2022   Chronic kidney disease, stage 3a (HCC) 01/03/2022   Chronic anemia 01/03/2022   Crohn disease (HCC) 01/03/2022   Chronic back pain 01/03/2022   AKI (acute kidney injury) (HCC) 09/08/2020   Chronic diastolic (congestive) heart failure (HCC) 10/04/2013   COPD (chronic obstructive pulmonary disease) (HCC) 04/08/2012   Anxiety 03/19/2012    Palliative Care Assessment & Plan   Patient Profile: 83 y.o. female  with past medical history of COPD, chronic respiratory  failure on 4 L, HfpEF, CAD, tachybradycardia syndrome s/p permanent pacemaker, Crohn's disease, SBO, psoriasis, migraine headaches, CKD stage III, and anxiety admitted on 02/28/2024 with shortness of breath.    Sepsis secondary to aspiration pneumonia.  Today's discussion: Reviewed chart. Patient to discharge home today with home health. Patient sitting up in bed in no distress. She is excited to be going home and seeing her cat Izzy. She is concerned about her nectar thick liquid requirement and if it will impact her Crohn's-- she plans to discuss further with her GI doctor. Patient is grateful for our discussion yesterday. She has begun reading the Hard Choices booklet. After discharge she plans to read the booklet and discuss her goals of care and medical wishes with her sister [name]. Gave patient information on the differences between outpatient palliative and hospice care. Answered questions about palliative care services outpatient. Patient is aware she can ask any of her regular providers to send referral for outpatient palliative if she chooses.  Emotional support and therapeutic listening provided. Encouraged patient to call PMT with any questions or concerns before discharge.  Recommendations/Plan: Full code  Full scope Encouraged patient to discuss goals of care with her sister [name] Peripheral PMT support   Code Status:    Code Status Orders  (From admission, onward)           Start     Ordered   02/28/24 0743  Full code  Continuous       Question:  By:  Answer:  Consent: discussion documented in EHR   02/28/24 0749         Extensive chart review has been completed prior to seeing the patient including labs, vital signs, imaging, progress/consult notes, orders, medications, and available advance directive documents.  Care plan was discussed with nursing  Time spent: 35 minutes  Thank you for allowing the Palliative Medicine Team to assist  in the care of this  patient.    [name] CHRISTELLA Palin, NP  Please contact Palliative Medicine Team phone at [phone] for questions and concerns.

## 2024-03-05 NOTE — TOC Transition Note (Signed)
 Transition of Care [facility]) - Discharge Note   Patient Details  Name: Brittany Irwin MRN: [id number] Date of Birth: May 16, 1941  Transition of Care [facility]) CM/SW Contact:  Marval Gell, RN Phone Number: 03/05/2024, 8:26 AM   Clinical Narrative:     Spoke w patient and she would like to use [facility] services, same as she had last year. She states family will bring O2 when they come for discharge. No other ICM needs identified.    Final next level of care: Home w Home Health Services Barriers to Discharge: No Barriers Identified   Patient Goals and CMS Choice Patient states their goals for this hospitalization and ongoing recovery are:: to go home CMS Medicare.gov Compare Post Acute Care list provided to:: Patient Choice offered to / list presented to : Patient      Discharge Placement                       Discharge Plan and Services Additional resources added to the After Visit Summary for                  DME Arranged: N/A         HH Arranged: RN, PT, Speech Therapy HH Agency: [facility] Date [facility] Agency Contacted: 03/05/24 Time HH Agency Contacted: [phone] Representative spoke with at [facility] Agency: Darleene  Social Drivers of Health (SDOH) Interventions SDOH Screenings   Food Insecurity: No Food Insecurity (02/28/2024)  Housing: Low Risk  (02/14/2024)  Transportation Needs: No Transportation Needs (02/28/2024)  Utilities: Not At Risk (02/28/2024)  Financial Resource Strain: Low Risk  (10/14/2023)   Received from [facility]  Recent Concern: Financial Resource Strain - Medium Risk (07/27/2023)   Received from [facility]  Physical Activity: Insufficiently Active (07/27/2023)   Received from [facility]  Social Connections: Unknown (02/28/2024)  Stress: No Stress Concern Present (07/27/2023)   Received from [facility]  Tobacco Use: Medium Risk (02/28/2024)     Readmission Risk Interventions    10/02/2023    2:35 PM 09/07/2023    3:27 PM   Readmission Risk Prevention Plan  Transportation Screening Complete Complete  PCP or Specialist Appt within 3-5 Days  Complete  HRI or Home Care Consult Complete Complete  Social Work Consult for Recovery Care Planning/Counseling Complete Complete  Palliative Care Screening Not Applicable Not Applicable  Medication Review Oceanographer) Complete Complete

## 2024-03-05 NOTE — Progress Notes (Signed)
 Dc order noted per MD. DC RN at bedside. Patient awaiting IV abx and family to arrive with portable O2. AVS printed/reviewed with patient. Instructed on importance of med compliance and close outpatient f/u with providers. On completion of IV abx, PIV removed. Skin intact. All belongings accounted for. Patient rolled downstairs for discharge at front entrance.

## 2024-03-05 NOTE — Plan of Care (Signed)

## 2024-03-05 NOTE — Discharge Instructions (Addendum)
 Use any over-the-counter thickening product to make your liquids nectar thick consistency.    Follow with Primary MD [name], MD in 7 days   Get CBC, CMP, Magnesium , 2 view Chest X ray -  checked next visit with your primary MD    Activity: As tolerated with Full fall precautions use walker/cane & assistance as needed  Disposition Home   Diet: Heart Healthy with nectar thick liquids with feeding assistance and aspiration precautions.  Special Instructions: If you have smoked or chewed Tobacco  in the last 2 yrs please stop smoking, stop any regular Alcohol   and or any Recreational drug use.  On your next visit with your primary care physician please Get Medicines reviewed and adjusted.  Please request your Prim.MD to go over all Hospital Tests and Procedure/Radiological results at the follow up, please get all Hospital records sent to your Prim MD by signing hospital release before you go home.  If you experience worsening of your admission symptoms, develop shortness of breath, life threatening emergency, suicidal or homicidal thoughts you must seek medical attention immediately by calling 911 or calling your MD immediately  if symptoms less severe.  You Must read complete instructions/literature along with all the possible adverse reactions/side effects for all the Medicines you take and that have been prescribed to you. Take any new Medicines after you have completely understood and accpet all the possible adverse reactions/side effects.   Do not drive when taking Pain medications.  Do not take more than prescribed Pain, Sleep and Anxiety Medications  Wear Seat belts while driving.

## 2024-03-21 NOTE — Progress Notes (Signed)
 Assessment & Plan 1. Pneumonia. She was recently hospitalized with pneumonia. The CT angiogram of her chest from August 11 showed right upper lobe patchy airspace disease consistent with multifocal pneumonia. She was treated with IV antibiotics during her hospital stay. She is currently on 4 L of oxygen.  2. Diarrhea. She reports experiencing diarrhea after starting Thick-It, which she believes may be causing her symptoms. She stopped using Thick-It, and the diarrhea improved. She is advised to stop using Thick-It for a while and then try it again to see if the diarrhea recurs. If the diarrhea returns, she should discontinue its use permanently.  3. Small bowel obstruction. She has a history of recurrent partial small bowel obstructions but has not experienced an obstruction recently. She manages potential obstructions at home with MiraLAX  and enemas.  4. Neuropathy. She experiences muscle spasms due to neuropathy, which can occur anywhere in her body.  5. Medication management. She requested a refill for her codeine  prescription. A prescription for codeine  will be sent to her pharmacy, [facility] in [place].  6. Pharyngeal dysphagia. The modified barium swallow from August 13 showed moderate pharyngeal dysphagia characterized by mistiming and reduced sensation. She is advised to follow an anti-reflux diet, avoid tight-fitting clothing, and eat small frequent meals. She should also consider elevating the head of her bed or using a wedge pillow to prevent regurgitation.   1. Partial small bowel obstruction (*)      2. Gastroesophageal reflux disease without esophagitis      3. Aspiration pneumonia of right middle lobe due to regurgitated food (*)      4. Diarrhea due to malabsorption (*)  acetaminophen -codeine  (TYLENOL  #4) 300-60 mg per tablet    5. Incontinence of feces with fecal urgency  acetaminophen -codeine  (TYLENOL  #4) 300-60 mg per tablet         Chief  Complaint:  GERD, possible regurgitation and aspiration, diarrhea    History of Present Illness The patient is an 83 year old woman who presents for evaluation of diarrhea potentially related to the use of ThickenUp.  She was recently hospitalized for pneumonia and was prescribed ThickenUp to manage difficulties with liquid intake. Since starting ThickenUp, she has experienced nighttime accidents and diarrhea, which improved upon discontinuation of the product. She is unsure if ThickenUp is the cause of her diarrhea and seeks further evaluation. During her hospital stay, she received IV antibiotics. She does not believe aspiration is the cause of her pneumonia, although she occasionally drinks while lying down due to back pain. She does not experience coughing or fluttering after drinking thin liquids but has had episodes of regurgitation, which she attributes to eating late and lying down too soon afterward. She does not elevate the head of her bed but finds that using an extra pillow can improve her breathing.  She has been advised to take steroids but is hesitant due to potential side effects. She was referred to a speech therapist for home visits but has not yet received a call. She is currently on 4 liters of oxygen and has experienced fluid overload in the past, requiring intervention from the cardiac ICU and the use of a BiPAP or CPAP machine. She has been diagnosed with atrial fibrillation and is on blood thinners. She has a pacemaker and uses a home monitor to track her heart rate.  She has a history of small bowel obstructions, with the last episode occurring about a year and a half ago. She has  managed a few episodes at home since then with MiraLAX  and enemas.  She experiences muscle spasms due to neuropathy, which can occur anywhere in her body, including her arm.  PAST MEDICAL HISTORY: - Crohn's disease - Recurrent partial small bowel obstructions - Pneumonia - Atrial fibrillation -  Neuropathy  PAST SURGICAL HISTORY: - Pacemaker placement (date not specified)  MEDICATIONS CURRENT MEDS: Codeine  Oral Colestid Oral MiraLAX  Oral Blood Thinner Oral       Past Medical History:  Diagnosis Date  . Allergy   . Anxiety 03/19/2012   Related to Crohn's  . Arthralgia 03/19/2012  . Atrial fibrillation (*)   . CHF (congestive heart failure) (*)   . Chronic obstructive pulmonary disease (*)   . Crohn's disease (*)   . Crohn's disease of both small and large intestine with intestinal obstruction (*)   . DDD (degenerative disc disease) 03/19/2012   With intermittent paid. Quiescent  . DJD (degenerative joint disease) 03/19/2012   Lower extremity noted while hospitalized at [facility] 02/2011; steroid inj with improvement  . Dry eyes 03/19/2012  . Emphysema of lung (*)   . Emphysema, unspecified (*)   . GERD (gastroesophageal reflux disease)   . Hemorrhoid   . Hemorrhoids   . Hilar mass 03/19/2012   Questionable on chest x-ray in 09/2003 with f/u chest x-ray ruling out  . Hypertension   . Hypertension   . Hypertriglyceridemia 03/19/2012   Dx 1999:  triglycerides 670. Intolerant of Lopid (constipation). TriCor  since 2001.  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  Followed by Dr. Jethro.  Episode  Of CHF in 06/2010  . Intestinal obstruction (*)   . Migraine headache 03/19/2012   Long term HX.   . Myalgia 03/19/2012  . Nephrolithiasis 03/19/2012   2006. Quiescent  . Neuromuscular disorder (*)   . 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 NSTEMI (non-ST elevated myocardial infarction) (*) 12/12/2019  . On home O2   . Osteopenia 03/19/2012   Dx 06/1999:  femoral neck T-score -1.5 and spine T-score -0.9.  BMD 11/2001:  femoral neck T-score -1.6 and spine T-score -1.0 Reclast therapy 2010.  . Osteoporosis   . Psoriasis   . Rotator cuff tear, left 03/19/2012   Followed by Dr.  Albertina  . Seasonal affective disorder   . Sleep walking  03/19/2012   With episodic falls  . Stage 3b chronic kidney disease (*) 01/03/2022   Last Assessment & Plan:   Formatting of this note might be different from the original.  Patient tolerated well diuresis with furosemide , at the time of her discharge her renal function had a serum cr of 1.11 with K at 4,0 and serum bicarbonate at 25.     Plan to continue diuresis with furosemide  and as needed metolazone .  Added SGLT2 inhibitor.    . Teeth missing   . Urinary urgency 03/19/2012   unresponsive to conservative therapy; previously eval. by urologist  . 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 Visual changes 03/19/2012  . Vitamin D  deficiency 03/19/2012   Dx 07/1999:  25-OH vitamin D  8.6. Pharmacologic therapy since 10/1999.    Past Surgical History:  Procedure Laterality Date  . Abdominal surgery     small bowell adhesions  . Appendectomy    . Breast surgery    . Cholecystectomy    . Colon surgery     partial colectomy  . Colonoscopy  01/22/2011  . [facility]  . Colonoscopy  07/27/2017   Dr. Milon  . Colonoscopy w/ biopsies N/A 07/27/2017   Procedure: COLONOSCOPY W/ BIOPSY;  Surgeon: Lamar JINNY Rosabel, MD;  Location: [facility] ENDO;  Service: Gastroenterology;  Laterality: N/A;  . Cosmetic surgery    . Eye surgery Bilateral    cataracts removed, lens implants  . Hemorrhoid surgery    . Hysterectomy    . Pacemarker     . Small intestine surgery    . Tonsillectomy    . Tubal ligation    . [facility] gastrointestinal [facility]  [facility]  . Upper gastrointestinal endoscopy  12/27/2017   Dr. Dustin        Medication Sig Dispense Refill  . acetaminophen -codeine  (TYLENOL  #4) 300-60 mg per tablet Take one tablet by mouth every 4 (four) hours as needed for Pain for up to 120 doses. Max Daily Amount: 6 tablets 120 tablet 0   . allopurinol (ZYLOPRIM) 100 mg tablet Take one half tablet (50 mg dose) by mouth daily. 15 tablet 2  . ALPRAZolam  (XANAX ) 1 mg tablet Take one tablet (1 mg dose) by mouth 2 (two) times a day as needed for Sleep. Max Daily Amount: 2 mg 60 tablet 2  . apixaban  (ELIQUIS ) 5 mg tablet Take one tablet (5 mg dose) by mouth 2 (two) times daily. 60 tablet 3  . atenolol  (TENORMIN ) 25 mg tablet Take one tablet (25 mg dose) by mouth daily. 90 tablet 1  . bumetanide  (BUMEX ) 1 mg tablet Take one tablet (1 mg dose) by mouth 2 (two) times daily. (Patient not taking: Reported on 03/21/2024) 60 tablet 3  . cetirizine (ZYRTEC) 10 mg tablet Take one tablet (10 mg dose) by mouth daily as needed for Allergies.    . cyanocobalamin (VITAMIN B-12) 1000 mcg/mL injection Inject 1 mL (1,000 mcg dose) into the muscle every 30 (thirty) days. 1 mL 11  . cyclobenzaprine  (FLEXERIL ) 5 mg tablet TAKE ONE TABLET BY MOUTH TWICE DAILY AS NEEDED FOR MUSCLE SPASMS DOSE decrease b/c of other medications 60 tablet 1  . ergocalciferol  (VITAMIN D , ERGOCALCIFEROL ,) 50,000 units CAPS capsule Take one capsule (50,000 Units dose) by mouth once a week. 12 capsule 1  . ezetimibe  (ZETIA ) 10 MG tablet TAKE ONE TABLET BY MOUTH AT BEDTIME 90 tablet 3  . fenofibrate  micronized (LOFIBRA) 200 mg capsule TAKE ONE CAPSULE BY MOUTH 30 MINUTES BEFORE BREAKFAST 90 capsule 0  . furosemide  (LASIX ) 40 mg tablet Take one tablet (40 mg dose) by mouth daily.    . levalbuterol  (XOPENEX  HFA) 45 MCG/ACT inhaler Inhale one puff into the lungs every 4 (four) hours as needed for Wheezing. 15 g 11  . levalbuterol  (XOPENEX ) 1.25 MG/3ML nebulizer solution Take 3 mLs (1.25 mg dose) by nebulization every 4 (four) hours as needed for Wheezing. 270 mL 2  . Naloxone HCl 4 MG/0.1ML LIQD nasal spray one spray by Nasal route once as needed for up to 1 dose. 1 each 0  . nitroGLYCERIN  (NITROSTAT ) 0.4 mg SL tablet Place one tablet (0.4 mg dose) under the tongue every 5 (five) minutes as  needed for Chest pain. 25 tablet 0  . omeprazole (PRILOSEC) 40 mg capsule Take one capsule (40 mg dose) by mouth 30 (thirty) minutes before breakfast. 30 capsule 5  . OXYGEN 3-4 L continuous.    . pregabalin  (LYRICA ) 50 mg capsule Take one  capsule (50 mg dose) by mouth 2 (two) times daily. Max Daily Amount: 100 mg 60 capsule 5  . spironolactone  (ALDACTONE ) 25 mg tablet Take one half tablet (12.5 mg dose) by mouth daily.    . TRELEGY ELLIPTA  200-62.5-25 MCG/ACT AEPB inhaler Inhale 1 puff into the lungs daily. 60 each 11   No current facility-administered medications for this visit.    Results Imaging CT angio of chest showed cardiomegaly with atherosclerosis, evidence of arterial hypertension, right upper lobe patchy airspace disease consistent with multifocal pneumonia and right lower lobe infiltrate favored to be pneumonitis. Emphysema and diverticulosis were also noted. Echocardiogram from August 16 shows left ventricular ejection fraction 60 to 65% with normal LV function and no wall motion abnormalities. Mild mitral regurgitation, tricuspid aortic valve with mild aortic regurgitation. Modified barium swallow from August 13 showed moderate pharyngeal dysphagia characterized primarily by mistiming and reduced sensation.  Review of Systems:  Review of Systems  Constitutional:  Negative for chills and fever.  HENT:  Negative for trouble swallowing.   Respiratory:  Positive for cough, choking and shortness of breath.   Cardiovascular:  Negative for chest pain.  Gastrointestinal:  Positive for diarrhea.  Skin:  Negative for rash.  All other systems reviewed and are negative.    Physical Exam:  BP 115/69   Pulse 69   Temp 97.7 F (36.5 C)   Ht 5' 4 (1.626 m)   Wt 160 lb 14.4 oz (73 kg)   LMP  (LMP Unknown)   BMI 27.62 kg/m  Physical Exam Vitals reviewed.  Constitutional:      General: She is not in acute distress.    Appearance: She is normal weight. She is ill-appearing.   Eyes:     General: No scleral icterus. Cardiovascular:     Rate and Rhythm: Normal rate and regular rhythm.     Pulses: Normal pulses.     Heart sounds: Normal heart sounds. No murmur heard. Pulmonary:     Effort: Pulmonary effort is normal.     Comments: Decreased breath sounds  Abdominal:     General: Abdomen is flat. Bowel sounds are normal. There is no distension.     Palpations: Abdomen is soft.     Tenderness: There is no abdominal tenderness.  Skin:    General: Skin is warm and dry.     Coloration: Skin is not jaundiced.  Neurological:     Mental Status: She is alert.  Psychiatric:        Mood and Affect: Mood normal.        Behavior: Behavior normal.        Thought Content: Thought content normal.        Judgment: Judgment normal.      Dentist was used to create visit note.  Consent from the patient/caregiver was obtained prior to its use.

## 2024-03-22 ENCOUNTER — Other Ambulatory Visit: Payer: Self-pay

## 2024-03-22 ENCOUNTER — Inpatient Hospital Stay (HOSPITAL_COMMUNITY)

## 2024-03-22 ENCOUNTER — Emergency Department (HOSPITAL_COMMUNITY)

## 2024-03-22 ENCOUNTER — Inpatient Hospital Stay (HOSPITAL_COMMUNITY)
Admission: EM | Admit: 2024-03-22 | Discharge: 2024-03-26 | DRG: 871 | Disposition: A | Discharge status: Home / Self Care | Attending: Internal Medicine | Admitting: Internal Medicine

## 2024-03-22 ENCOUNTER — Encounter (HOSPITAL_COMMUNITY): Payer: Self-pay

## 2024-03-22 DIAGNOSIS — Z7901 Long term (current) use of anticoagulants: Secondary | ICD-10-CM

## 2024-03-22 DIAGNOSIS — K5090 Crohn's disease, unspecified, without complications: Secondary | ICD-10-CM | POA: Diagnosis present

## 2024-03-22 DIAGNOSIS — F419 Anxiety disorder, unspecified: Secondary | ICD-10-CM | POA: Diagnosis present

## 2024-03-22 DIAGNOSIS — Z885 Allergy status to narcotic agent status: Secondary | ICD-10-CM

## 2024-03-22 DIAGNOSIS — Z87891 Personal history of nicotine dependence: Secondary | ICD-10-CM

## 2024-03-22 DIAGNOSIS — Y95 Nosocomial condition: Secondary | ICD-10-CM | POA: Diagnosis present

## 2024-03-22 DIAGNOSIS — N1831 Chronic kidney disease, stage 3a: Secondary | ICD-10-CM | POA: Diagnosis present

## 2024-03-22 DIAGNOSIS — J441 Chronic obstructive pulmonary disease with (acute) exacerbation: Secondary | ICD-10-CM | POA: Diagnosis present

## 2024-03-22 DIAGNOSIS — I495 Sick sinus syndrome: Secondary | ICD-10-CM | POA: Diagnosis present

## 2024-03-22 DIAGNOSIS — I10 Essential (primary) hypertension: Secondary | ICD-10-CM | POA: Diagnosis not present

## 2024-03-22 DIAGNOSIS — I5032 Chronic diastolic (congestive) heart failure: Secondary | ICD-10-CM | POA: Diagnosis present

## 2024-03-22 DIAGNOSIS — Z880 Allergy status to penicillin: Secondary | ICD-10-CM

## 2024-03-22 DIAGNOSIS — J9611 Chronic respiratory failure with hypoxia: Secondary | ICD-10-CM | POA: Diagnosis not present

## 2024-03-22 DIAGNOSIS — J9601 Acute respiratory failure with hypoxia: Secondary | ICD-10-CM

## 2024-03-22 DIAGNOSIS — I2489 Other forms of acute ischemic heart disease: Secondary | ICD-10-CM | POA: Diagnosis present

## 2024-03-22 DIAGNOSIS — E785 Hyperlipidemia, unspecified: Secondary | ICD-10-CM | POA: Diagnosis present

## 2024-03-22 DIAGNOSIS — Z90710 Acquired absence of both cervix and uterus: Secondary | ICD-10-CM

## 2024-03-22 DIAGNOSIS — J440 Chronic obstructive pulmonary disease with acute lower respiratory infection: Secondary | ICD-10-CM | POA: Diagnosis present

## 2024-03-22 DIAGNOSIS — Z1152 Encounter for screening for COVID-19: Secondary | ICD-10-CM | POA: Diagnosis not present

## 2024-03-22 DIAGNOSIS — Z888 Allergy status to other drugs, medicaments and biological substances status: Secondary | ICD-10-CM

## 2024-03-22 DIAGNOSIS — A419 Sepsis, unspecified organism: Secondary | ICD-10-CM | POA: Diagnosis present

## 2024-03-22 DIAGNOSIS — Z883 Allergy status to other anti-infective agents status: Secondary | ICD-10-CM

## 2024-03-22 DIAGNOSIS — Z79899 Other long term (current) drug therapy: Secondary | ICD-10-CM | POA: Diagnosis not present

## 2024-03-22 DIAGNOSIS — Z882 Allergy status to sulfonamides status: Secondary | ICD-10-CM

## 2024-03-22 DIAGNOSIS — Z950 Presence of cardiac pacemaker: Secondary | ICD-10-CM

## 2024-03-22 DIAGNOSIS — N179 Acute kidney failure, unspecified: Secondary | ICD-10-CM | POA: Diagnosis present

## 2024-03-22 DIAGNOSIS — J189 Pneumonia, unspecified organism: Secondary | ICD-10-CM | POA: Diagnosis present

## 2024-03-22 DIAGNOSIS — R1313 Dysphagia, pharyngeal phase: Secondary | ICD-10-CM | POA: Diagnosis present

## 2024-03-22 DIAGNOSIS — J9621 Acute and chronic respiratory failure with hypoxia: Secondary | ICD-10-CM | POA: Diagnosis present

## 2024-03-22 DIAGNOSIS — Z91013 Allergy to seafood: Secondary | ICD-10-CM

## 2024-03-22 DIAGNOSIS — M48061 Spinal stenosis, lumbar region without neurogenic claudication: Secondary | ICD-10-CM | POA: Diagnosis present

## 2024-03-22 DIAGNOSIS — I2510 Atherosclerotic heart disease of native coronary artery without angina pectoris: Secondary | ICD-10-CM | POA: Diagnosis present

## 2024-03-22 LAB — I-STAT ARTERIAL BLOOD GAS, ED
Acid-Base Excess: 4 mmol/L — ABNORMAL HIGH (ref 0.0–2.0)
Bicarbonate: 28.7 mmol/L — ABNORMAL HIGH (ref 20.0–28.0)
Calcium, Ion: 1.17 mmol/L (ref 1.15–1.40)
HCT: 37 % (ref 36.0–46.0)
Hemoglobin: 12.6 g/dL (ref 12.0–15.0)
O2 Saturation: 93 %
Patient temperature: 98
Potassium: 3.8 mmol/L (ref 3.5–5.1)
Sodium: 138 mmol/L (ref 135–145)
TCO2: 30 mmol/L (ref 22–32)
pCO2 arterial: 42.3 mmHg (ref 32–48)
pH, Arterial: 7.438 (ref 7.35–7.45)
pO2, Arterial: 64 mmHg — ABNORMAL LOW (ref 83–108)

## 2024-03-22 LAB — PROCALCITONIN: Procalcitonin: 0.18 ng/mL

## 2024-03-22 LAB — CBC WITH DIFFERENTIAL/PLATELET
Abs Immature Granulocytes: 0.05 K/uL (ref 0.00–0.07)
Basophils Absolute: 0.1 K/uL (ref 0.0–0.1)
Basophils Relative: 1 %
Eosinophils Absolute: 0.1 K/uL (ref 0.0–0.5)
Eosinophils Relative: 1 %
HCT: 40.3 % (ref 36.0–46.0)
Hemoglobin: 12.9 g/dL (ref 12.0–15.0)
Immature Granulocytes: 0 %
Lymphocytes Relative: 5 %
Lymphs Abs: 0.6 K/uL — ABNORMAL LOW (ref 0.7–4.0)
MCH: 29.4 pg (ref 26.0–34.0)
MCHC: 32 g/dL (ref 30.0–36.0)
MCV: 91.8 fL (ref 80.0–100.0)
Monocytes Absolute: 0.6 K/uL (ref 0.1–1.0)
Monocytes Relative: 4 %
Neutro Abs: 11.5 K/uL — ABNORMAL HIGH (ref 1.7–7.7)
Neutrophils Relative %: 89 %
Platelets: 179 K/uL (ref 150–400)
RBC: 4.39 MIL/uL (ref 3.87–5.11)
RDW: 13.9 % (ref 11.5–15.5)
WBC: 12.8 K/uL — ABNORMAL HIGH (ref 4.0–10.5)
nRBC: 0 % (ref 0.0–0.2)

## 2024-03-22 LAB — RESP PANEL BY RT-PCR (RSV, FLU A&B, COVID)  RVPGX2
Influenza A by PCR: NEGATIVE
Influenza B by PCR: NEGATIVE
Resp Syncytial Virus by PCR: NEGATIVE
SARS Coronavirus 2 by RT PCR: NEGATIVE

## 2024-03-22 LAB — URINALYSIS, ROUTINE W REFLEX MICROSCOPIC
Bilirubin Urine: NEGATIVE
Glucose, UA: NEGATIVE mg/dL
Hgb urine dipstick: NEGATIVE
Ketones, ur: NEGATIVE mg/dL
Nitrite: NEGATIVE
Protein, ur: NEGATIVE mg/dL
Specific Gravity, Urine: 1.017 (ref 1.005–1.030)
pH: 5 (ref 5.0–8.0)

## 2024-03-22 LAB — TROPONIN I (HIGH SENSITIVITY)
Troponin I (High Sensitivity): 205 ng/L (ref <18)
Troponin I (High Sensitivity): 219 ng/L (ref <18)

## 2024-03-22 LAB — HEPATIC FUNCTION PANEL
ALT: 11 U/L (ref 0–44)
AST: 33 U/L (ref 15–41)
Albumin: 3.9 g/dL (ref 3.5–5.0)
Alkaline Phosphatase: 34 U/L — ABNORMAL LOW (ref 38–126)
Bilirubin, Direct: 0.3 mg/dL — ABNORMAL HIGH (ref 0.0–0.2)
Indirect Bilirubin: 0.6 mg/dL (ref 0.3–0.9)
Total Bilirubin: 0.9 mg/dL (ref 0.0–1.2)
Total Protein: 7.3 g/dL (ref 6.5–8.1)

## 2024-03-22 LAB — I-STAT VENOUS BLOOD GAS, ED
Acid-Base Excess: 5 mmol/L — ABNORMAL HIGH (ref 0.0–2.0)
Bicarbonate: 30.5 mmol/L — ABNORMAL HIGH (ref 20.0–28.0)
Calcium, Ion: 1.13 mmol/L — ABNORMAL LOW (ref 1.15–1.40)
HCT: 37 % (ref 36.0–46.0)
Hemoglobin: 12.6 g/dL (ref 12.0–15.0)
O2 Saturation: 49 %
Patient temperature: 98
Potassium: 3.9 mmol/L (ref 3.5–5.1)
Sodium: 137 mmol/L (ref 135–145)
TCO2: 32 mmol/L (ref 22–32)
pCO2, Ven: 45.3 mmHg (ref 44–60)
pH, Ven: 7.435 — ABNORMAL HIGH (ref 7.25–7.43)
pO2, Ven: 26 mmHg — CL (ref 32–45)

## 2024-03-22 LAB — I-STAT CG4 LACTIC ACID, ED
Lactic Acid, Venous: 1.9 mmol/L (ref 0.5–1.9)
Lactic Acid, Venous: 1.1 mmol/L (ref 0.5–1.9)

## 2024-03-22 LAB — I-STAT CHEM 8, ED
BUN: 40 mg/dL — ABNORMAL HIGH (ref 8–23)
Calcium, Ion: 1.15 mmol/L (ref 1.15–1.40)
Chloride: 98 mmol/L (ref 98–111)
Creatinine, Ser: 2.1 mg/dL — ABNORMAL HIGH (ref 0.44–1.00)
Glucose, Bld: 112 mg/dL — ABNORMAL HIGH (ref 70–99)
HCT: 37 % (ref 36.0–46.0)
Hemoglobin: 12.6 g/dL (ref 12.0–15.0)
Potassium: 3.9 mmol/L (ref 3.5–5.1)
Sodium: 140 mmol/L (ref 135–145)
TCO2: 28 mmol/L (ref 22–32)

## 2024-03-22 LAB — BRAIN NATRIURETIC PEPTIDE: B Natriuretic Peptide: 402.3 pg/mL — ABNORMAL HIGH (ref 0.0–100.0)

## 2024-03-22 LAB — MRSA NEXT GEN BY PCR, NASAL: MRSA by PCR Next Gen: NOT DETECTED

## 2024-03-22 LAB — C DIFFICILE QUICK SCREEN W PCR REFLEX
C Diff antigen: NEGATIVE
C Diff interpretation: NOT DETECTED
C Diff toxin: NEGATIVE

## 2024-03-22 LAB — LIPASE, BLOOD: Lipase: 36 U/L (ref 11–51)

## 2024-03-22 LAB — HIV ANTIBODY (ROUTINE TESTING W REFLEX): HIV Screen 4th Generation wRfx: NONREACTIVE

## 2024-03-22 MED ORDER — ACETAMINOPHEN 325 MG PO TABS
650.0000 mg | ORAL_TABLET | Freq: Once | ORAL | Status: AC
  Administered 2024-03-22: 650 mg via ORAL
  Filled 2024-03-22: qty 2

## 2024-03-22 MED ORDER — LACTATED RINGERS IV BOLUS (SEPSIS): 250.0000 mL | Freq: Once | INTRAVENOUS | Status: DC

## 2024-03-22 MED ORDER — IPRATROPIUM-ALBUTEROL 0.5-2.5 (3) MG/3ML IN SOLN
3.0000 mL | Freq: Once | RESPIRATORY_TRACT | Status: AC
  Administered 2024-03-22: 3 mL via RESPIRATORY_TRACT
  Filled 2024-03-22: qty 3

## 2024-03-22 MED ORDER — SODIUM CHLORIDE 0.9 % IV SOLN: 2.0000 g | INTRAVENOUS | Status: DC

## 2024-03-22 MED ORDER — RISAQUAD PO CAPS
1.0000 | ORAL_CAPSULE | Freq: Three times a day (TID) | ORAL | Status: DC
  Administered 2024-03-22 – 2024-03-26 (×11): 1 via ORAL
  Filled 2024-03-22 (×10): qty 1

## 2024-03-22 MED ORDER — VANCOMYCIN HCL IN DEXTROSE 1-5 GM/200ML-% IV SOLN
1000.0000 mg | Freq: Once | INTRAVENOUS | Status: AC
  Administered 2024-03-22: 1000 mg via INTRAVENOUS
  Filled 2024-03-22: qty 200

## 2024-03-22 MED ORDER — NITROGLYCERIN 0.4 MG SL SUBL: 0.4000 mg | SUBLINGUAL_TABLET | SUBLINGUAL | Status: DC | PRN

## 2024-03-22 MED ORDER — SODIUM CHLORIDE 0.9 % IV BOLUS: 500.0000 mL | Freq: Once | INTRAVENOUS | Status: DC

## 2024-03-22 MED ORDER — BUDESON-GLYCOPYRROL-FORMOTEROL 160-9-4.8 MCG/ACT IN AERO
2.0000 | INHALATION_SPRAY | Freq: Two times a day (BID) | RESPIRATORY_TRACT | Status: DC
  Administered 2024-03-22 – 2024-03-26 (×7): 2 via RESPIRATORY_TRACT
  Filled 2024-03-22: qty 5.9

## 2024-03-22 MED ORDER — EZETIMIBE 10 MG PO TABS
10.0000 mg | ORAL_TABLET | Freq: Every day | ORAL | Status: DC
  Administered 2024-03-22 – 2024-03-25 (×4): 10 mg via ORAL
  Filled 2024-03-22 (×4): qty 1

## 2024-03-22 MED ORDER — DICLOFENAC SODIUM 1 % EX GEL
2.0000 g | Freq: Four times a day (QID) | CUTANEOUS | Status: DC
  Administered 2024-03-22 – 2024-03-25 (×10): 2 g via TOPICAL
  Filled 2024-03-22: qty 100

## 2024-03-22 MED ORDER — APIXABAN 5 MG PO TABS
5.0000 mg | ORAL_TABLET | Freq: Two times a day (BID) | ORAL | Status: DC
  Administered 2024-03-22 – 2024-03-26 (×8): 5 mg via ORAL
  Filled 2024-03-22 (×8): qty 1

## 2024-03-22 MED ORDER — IPRATROPIUM-ALBUTEROL 0.5-2.5 (3) MG/3ML IN SOLN
3.0000 mL | Freq: Four times a day (QID) | RESPIRATORY_TRACT | Status: DC
  Administered 2024-03-22 – 2024-03-23 (×3): 3 mL via RESPIRATORY_TRACT
  Filled 2024-03-22 (×3): qty 3

## 2024-03-22 MED ORDER — VANCOMYCIN VARIABLE DOSE PER UNSTABLE RENAL FUNCTION (PHARMACIST DOSING): Status: DC

## 2024-03-22 MED ORDER — LEVALBUTEROL HCL 0.63 MG/3ML IN NEBU: 0.6300 mg | INHALATION_SOLUTION | RESPIRATORY_TRACT | Status: DC | PRN

## 2024-03-22 MED ORDER — SODIUM CHLORIDE 0.9 % IV SOLN: Freq: Once | INTRAVENOUS | Status: AC

## 2024-03-22 MED ORDER — LACTATED RINGERS IV BOLUS (SEPSIS): 1000.0000 mL | Freq: Once | INTRAVENOUS | Status: DC

## 2024-03-22 MED ORDER — PREGABALIN 25 MG PO CAPS
50.0000 mg | ORAL_CAPSULE | Freq: Two times a day (BID) | ORAL | Status: DC
  Administered 2024-03-22 – 2024-03-26 (×8): 50 mg via ORAL
  Filled 2024-03-22 (×8): qty 2

## 2024-03-22 MED ORDER — LACTATED RINGERS IV BOLUS (SEPSIS)
500.0000 mL | Freq: Once | INTRAVENOUS | Status: AC
  Administered 2024-03-22: 500 mL via INTRAVENOUS

## 2024-03-22 MED ORDER — SODIUM CHLORIDE 0.9 % IV SOLN
2.0000 g | Freq: Once | INTRAVENOUS | Status: AC
  Administered 2024-03-22: 2 g via INTRAVENOUS
  Filled 2024-03-22: qty 12.5

## 2024-03-22 MED ORDER — SODIUM CHLORIDE 0.9 % IV SOLN
2.0000 g | INTRAVENOUS | Status: DC
  Administered 2024-03-23 – 2024-03-25 (×3): 2 g via INTRAVENOUS
  Filled 2024-03-22 (×3): qty 12.5

## 2024-03-22 MED ORDER — METHYLPREDNISOLONE SODIUM SUCC 125 MG IJ SOLR: 125.0000 mg | Freq: Once | INTRAMUSCULAR | Status: DC

## 2024-03-22 MED ORDER — VANCOMYCIN HCL 500 MG/100ML IV SOLN
500.0000 mg | INTRAVENOUS | Status: AC
  Administered 2024-03-22: 500 mg via INTRAVENOUS
  Filled 2024-03-22: qty 100

## 2024-03-22 NOTE — ED Provider Notes (Signed)
 [place] EMERGENCY DEPARTMENT AT [facility] Provider Note   CSN: [id number] Arrival date & time: 03/22/24  0900     Patient presents with: Code Sepsis   Brittany Irwin is a 83 y.o. female.   83 year old female presents for evaluation of possible sepsis.  Patient presents altered, with a fever and hypoxic.  She does wake up to voice.  She is somewhat confused but able to provide a history.  She states that she is on 4 L nasal cannula at baseline at home for COPD.  She was recently at her doctor's office yesterday after being treated for pneumonia.  States she was fine yesterday and woke up today not feeling well.  Sister at bedside helps to provide history as well.        Prior to Admission medications   Medication Sig Start Date End Date Taking? Authorizing Provider  acetaminophen -codeine  (TYLENOL  #4) 300-60 MG tablet Take 1 tablet by mouth every 6 (six) hours as needed for pain. 12/31/21   [provider]  ALPRAZolam  (XANAX ) 1 MG tablet Take 1 mg by mouth 2 (two) times daily. 01/02/22   [provider]  apixaban  (ELIQUIS ) 5 MG TABS tablet Take 5 mg by mouth 2 (two) times daily.    [provider]  atenolol  (TENORMIN ) 25 MG tablet Take 1 tablet (25 mg total) by mouth daily. 02/16/24   Sheikh, Omair Latif, DO  cyanocobalamin (,VITAMIN B-12,) 1000 MCG/ML injection Inject 1,000 mcg into the muscle every 30 (thirty) days.    [provider]  cyclobenzaprine  (FLEXERIL ) 5 MG tablet Take 5 mg by mouth daily as needed for muscle spasms. 10/13/22   [provider]  diclofenac  Sodium (VOLTAREN  ARTHRITIS PAIN) 1 % GEL Apply 1 Application topically in the morning and at bedtime.    [provider]  ergocalciferol  (VITAMIN D2) 1.25 MG (50000 UT) capsule Take 50,000 Units by mouth once a week. Every thursday    [provider]  ezetimibe  (ZETIA ) 10 MG tablet Take 10 mg by mouth at bedtime. 12/31/21   [provider]   fenofibrate  micronized (LOFIBRA) 200 MG capsule Take 200 mg by mouth daily. 12/31/21   [provider]  fluconazole  (DIFLUCAN ) 150 MG tablet Take 150 mg by mouth daily.    [provider]  Fluticasone -Umeclidin-Vilant (TRELEGY ELLIPTA ) 200-62.5-25 MCG/ACT AEPB Inhale 1 puff into the lungs daily. 12/22/23     furosemide  (LASIX ) 40 MG tablet Take 1 tablet (40 mg total) by mouth daily. 03/05/24 03/05/25  Dennise Lavada POUR, MD  levalbuterol  (XOPENEX ) 0.63 MG/3ML nebulizer solution Take 3 mLs (0.63 mg total) by nebulization every 4 (four) hours as needed for wheezing or shortness of breath. 09/11/23 03/22/24  Christobal Guadalajara, MD  loratadine  (CLARITIN ) 10 MG tablet Take 1 tablet (10 mg total) by mouth daily for 14 days. Patient taking differently: Take 10 mg by mouth daily as needed for allergies. 09/12/23 03/22/24  Christobal Guadalajara, MD  Menthol, Topical Analgesic, (ICY HOT BACK EX) Apply 1 Application topically 2 (two) times daily as needed (back pain).    [provider]  nitroGLYCERIN  (NITROSTAT ) 0.4 MG SL tablet Place 0.4 mg under the tongue every 5 (five) minutes x 3 doses as needed for chest pain. 12/31/21   [provider]  omeprazole (PRILOSEC) 40 MG capsule Take 40 mg by mouth daily.    [provider]  pregabalin  (LYRICA ) 50 MG capsule Take 50 mg by mouth 2 (two) times daily.  [provider]  spironolactone  (ALDACTONE ) 25 MG tablet Take 0.5 tablets (12.5 mg total) by mouth daily. 03/05/24 04/04/24  Dennise Lavada POUR, MD  ST[facility] DEXTRIN [facility]) POWD Make liquids nectar thick. Kindly dispense any thickening product to make liquids nectar thick consistency, 1 month supply no refills. 03/05/24   Singh, Prashant K, MD    Allergies: Methylprednisolone , Shellfish allergy, Ativan  [lorazepam ], Azulfidine [sulfasalazine], Epipen [epinephrine], Flagyl [metronidazole], Motrin [ibuprofen], Nsaids, Penicillins, and Morphine and codeine     Review of Systems   Constitutional:  Positive for fatigue and fever. Negative for chills.  HENT:  Negative for ear pain and sore throat.   Eyes:  Negative for pain and visual disturbance.  Respiratory:  Positive for cough and shortness of breath.   Cardiovascular:  Negative for chest pain and palpitations.  Gastrointestinal:  Negative for abdominal pain and vomiting.  Genitourinary:  Negative for dysuria and hematuria.  Musculoskeletal:  Negative for arthralgias and back pain.  Skin:  Negative for color change and rash.  Neurological:  Negative for seizures and syncope.  All other systems reviewed and are negative.   Updated Vital Signs BP (!) 144/60   Pulse 81   Temp 98.9 F (37.2 C) (Oral)   Resp 17   Ht 5' 4 (1.626 m)   Wt 72.8 kg   SpO2 (!) 89%   BMI 27.55 kg/m   Physical Exam Vitals and nursing note reviewed.  Constitutional:      General: She is in acute distress.     Appearance: She is well-developed. She is ill-appearing and toxic-appearing.  HENT:     Head: Normocephalic and atraumatic.  Eyes:     Conjunctiva/sclera: Conjunctivae normal.  Cardiovascular:     Rate and Rhythm: Regular rhythm. Tachycardia present.     Pulses: Normal pulses.     Heart sounds: Normal heart sounds. No murmur heard. Pulmonary:     Effort: No respiratory distress.     Breath sounds: No stridor. No wheezing or rhonchi.     Comments: Tachypneic, mild wheezing Abdominal:     General: There is no distension.     Palpations: Abdomen is soft. There is no mass.     Tenderness: There is no abdominal tenderness.     Hernia: No hernia is present.  Musculoskeletal:        General: No swelling.     Cervical back: Neck supple.  Skin:    General: Skin is warm and dry.     Capillary Refill: Capillary refill takes less than 2 seconds.  Neurological:     General: No focal deficit present.     Mental Status: She is alert.     Comments: Sleeping, but arouses to voice, at times confused, can provide some history  and follow commands   Psychiatric:        Mood and Affect: Mood normal.     (all labs ordered are listed, but only abnormal results are displayed) Labs Reviewed  CBC WITH DIFFERENTIAL/PLATELET - Abnormal; Notable for the following components:      Result Value   WBC 12.8 (*)    Neutro Abs 11.5 (*)    Lymphs Abs 0.6 (*)    All other components within normal limits  HEPATIC FUNCTION PANEL - Abnormal; Notable for the following components:   Alkaline Phosphatase 34 (*)    Bilirubin, Direct 0.3 (*)    All other components within normal limits  BRAIN NATRIURETIC PEPTIDE - Abnormal; Notable for the following components:  B Natriuretic Peptide 402.3 (*)    All other components within normal limits  URINALYSIS, ROUTINE W REFLEX MICROSCOPIC - Abnormal; Notable for the following components:   Leukocytes,Ua TRACE (*)    Bacteria, UA RARE (*)    All other components within normal limits  I-STAT CHEM 8, ED - Abnormal; Notable for the following components:   BUN 40 (*)    Creatinine, Ser 2.10 (*)    Glucose, Bld 112 (*)    All other components within normal limits  I-STAT ARTERIAL BLOOD GAS, ED - Abnormal; Notable for the following components:   pO2, Arterial 64 (*)    Bicarbonate 28.7 (*)    Acid-Base Excess 4.0 (*)    All other components within normal limits  I-STAT VENOUS BLOOD GAS, ED - Abnormal; Notable for the following components:   pH, Ven 7.435 (*)    pO2, Ven 26 (*)    Bicarbonate 30.5 (*)    Acid-Base Excess 5.0 (*)    Calcium, Ion 1.13 (*)    All other components within normal limits  TROPONIN I (HIGH SENSITIVITY) - Abnormal; Notable for the following components:   Troponin I (High Sensitivity) 205 (*)    All other components within normal limits  TROPONIN I (HIGH SENSITIVITY) - Abnormal; Notable for the following components:   Troponin I (High Sensitivity) 219 (*)    All other components within normal limits  CULTURE, BLOOD (ROUTINE X 2)  RESP PANEL BY RT-PCR (RSV,  FLU A&B, COVID)  [id number]  CULTURE, BLOOD (ROUTINE X 2)  GASTROINTESTINAL PANEL BY PCR, STOOL (REPLACES STOOL CULTURE)  C DIFFICILE QUICK SCREEN W PCR REFLEX    LIPASE, BLOOD  PROCALCITONIN  HIV ANTIBODY (ROUTINE TESTING W REFLEX)  I-STAT CG4 LACTIC ACID, ED  I-STAT CG4 LACTIC ACID, ED    EKG: EKG Interpretation Date/Time:  Wednesday March 22 2024 13:54:43 EDT Ventricular Rate:  60 PR Interval:  241 QRS Duration:  162 QT Interval:  502 QTC Calculation: 502 R Axis:   170  Text Interpretation: Sinus rhythm Prolonged PR interval Right bundle branch block Anterolateral infarct, old Compared with previous EKG from earlier today, there are no acute changes Confirmed by Gennaro Bouchard ([id number]) on 03/22/2024 2:04:53 PM  Radiology: CT Chest Wo Contrast Result Date: 03/22/2024 CLINICAL DATA:  Shortness of breath and cough.  Hypoxia. EXAM: CT CHEST WITHOUT CONTRAST TECHNIQUE: Multidetector CT imaging of the chest was performed following the standard protocol without IV contrast. RADIATION DOSE REDUCTION: This exam was performed according to the departmental dose-optimization program which includes automated exposure control, adjustment of the mA and/or kV according to patient size and/or use of iterative reconstruction technique. COMPARISON:  Chest CT dated 02/28/2024. FINDINGS: Evaluation of this exam is limited in the absence of intravenous contrast. Cardiovascular: There is no cardiomegaly or pericardial effusion. There is coronary vascular calcification and calcification of the mitral annulus. Left pectoral pacemaker device. Mild atherosclerotic calcification of the thoracic aorta. No aneurysmal dilatation. There is mildly dilated main pulmonary trunk suggestive of pulmonary hypertension. Mediastinum/Nodes: No hilar adenopathy. Subcarinal lymph node measures 9 mm in short axis. A lymph node anterior to the carina measures 12 mm short axis. The esophagus is grossly unremarkable no mediastinal fluid  collection. Lungs/Pleura: Background of emphysema. Similar appearance of consolidative changes of the right middle lobe with air bronchogram, likely atelectasis. Linear and bandlike atelectasis/scarring at the lung bases. Faint areas of ground-glass density in the right lower lobe and to a lesser degree in the right  upper lobe may be chronic and sequela of prior inflammatory process or represent residual or recurrent atypical pneumonia. The previously seen 5 mm nodule in the posteromedial right lung base is not visualized on today's exam. No pleural effusion or pneumothorax. The central airways are patent. Upper Abdomen: No acute abnormality. Musculoskeletal: Osteopenia with degenerative changes. No acute osseous pathology. IMPRESSION: 1. Faint areas of ground-glass density in the right lower lobe and to a lesser degree in the right upper lobe may be chronic and sequela of prior inflammatory process or represent residual or recurrent atypical pneumonia. 2. Similar appearance of consolidative changes of the right middle lobe with air bronchogram, likely atelectasis. 3. Aortic Atherosclerosis (ICD10-I70.0) and Emphysema (ICD10-J43.9). Electronically Signed   By: Vanetta Chou M.D.   On: 03/22/2024 14:41   DG Chest 1 View Result Date: 03/22/2024 CLINICAL DATA:  Shortness of breath, sepsis. EXAM: CHEST  1 VIEW COMPARISON:  03/03/2024 and CT chest 02/28/2024. FINDINGS: Patient is slightly rotated. Trachea is midline. Heart is enlarged, stable. Pacemaker lead tips are in the right atrium and right ventricle. Minimal streaky atelectasis in the lung bases. No pleural fluid. IMPRESSION: Minimal bibasilar streaky atelectasis. Electronically Signed   By: Newell Eke M.D.   On: 03/22/2024 10:57     Procedures   Medications Ordered in the ED  vancomycin  variable dose per unstable renal function (pharmacist dosing) (has no administration in time range)  ceFEPIme  (MAXIPIME ) 2 g in sodium chloride  0.9 % 100 mL IVPB  (has no administration in time range)  acetaminophen  (TYLENOL ) tablet 650 mg (650 mg Oral Given 03/22/24 1010)  ceFEPIme  (MAXIPIME ) 2 g in sodium chloride  0.9 % 100 mL IVPB (0 g Intravenous Stopped 03/22/24 1048)  vancomycin  (VANCOCIN ) IVPB 1000 mg/200 mL premix (0 mg Intravenous Stopped 03/22/24 1153)  ipratropium-albuterol  (DUONEB) 0.5-2.5 (3) MG/3ML nebulizer solution 3 mL (3 mLs Nebulization Given 03/22/24 0944)  lactated ringers  bolus 500 mL (0 mLs Intravenous Stopped 03/22/24 1048)  0.9 %  sodium chloride  infusion ( Intravenous New Bag/Given 03/22/24 1424)  vancomycin  (VANCOREADY) IVPB 500 mg/100 mL (500 mg Intravenous New Bag/Given 03/22/24 1418)                                    Medical Decision Making Cardiac monitor interpretation: Sinus rhythm, no ectopy  Patient arrived to the ER hypoxic and confused.  She is lethargic but does arouse to voice.  Does answer some questions appropriately but gets confused about the situation.  She is mostly on 4 L nasal cannula baseline and we had to place her on 8 L nasal cannula high flow but she did have improvement in her oxygen saturation from 80s to 90s.  A breathing treatment helped as well.  Was not given steroids for her COPD as she has an allergy to this.  Troponin slightly elevated as well, but she has a troponin leak at baseline.  Was given aspirin .  Was treated for possible sepsis as she had a fever on arrival as well.  Given Tylenol  and broad-spectrum antibiotics.  We did not give a 30 cc/kg IV fluid bolus though she does have a history of CHF.  Patient's case discussed with Dr. Tobie, hospitalist and patient will be admitted for further workup and management.  All results and plan discussed with patient and family member at bedside and they are agreeable with plan for admission.  Problems Addressed: Acute hypoxic respiratory failure (  HCC): acute illness or injury that poses a threat to life or bodily functions COPD exacerbation (HCC): acute illness or  injury that poses a threat to life or bodily functions Sepsis, due to unspecified organism, unspecified whether acute organ dysfunction present [facility]): acute illness or injury that poses a threat to life or bodily functions  Amount and/or Complexity of Data Reviewed External Data Reviewed: notes.    Details: Prior outpatient records reviewed and patient was recently treated for pneumonia and seen in the outpatient office yesterday Labs: ordered. Decision-making details documented in ED Course.    Details: Ordered and reviewed by me and patient does have a leukocytosis, troponin is elevated but she has a troponin leak at baseline. Radiology: ordered and independent interpretation performed. Decision-making details documented in ED Course.    Details: Chest x-ray was ordered and interpreted independently of radiology Chest x-ray shows no acute abnormality  CT chest ordered and reviewed and shows possible pneumonia ECG/medicine tests: ordered and independent interpretation performed. Decision-making details documented in ED Course.    Details: Ordered and inter by me in the absence of cardiology and shows sinus rhythm, no STEMI or acute change when compared to prior, she does have some nonspecific changes that are chronic Discussion of management or test interpretation with external provider(s): Dr. York spoke with her regarding the patient's case and she will admit the patient for further workup and management.  Risk OTC drugs. Prescription drug management. Drug therapy requiring intensive monitoring for toxicity. Decision regarding hospitalization. Risk Details: CRITICAL CARE Performed by: Duwaine LITTIE Fusi   Total critical care time: 53 minutes  Critical care time was exclusive of separately billable procedures and treating other patients.  Critical care was necessary to treat or prevent imminent or life-threatening deterioration.  Critical care was time spent personally  by me on the following activities: development of treatment plan with patient and/or surrogate as well as nursing, discussions with consultants, evaluation of patient's response to treatment, examination of patient, obtaining history from patient or surrogate, ordering and performing treatments and interventions, ordering and review of laboratory studies, ordering and review of radiographic studies, pulse oximetry and re-evaluation of patient's condition.   Critical Care Total time providing critical care: 53 minutes    Final diagnoses:  Sepsis, due to unspecified organism, unspecified whether acute organ dysfunction present [facility])  Acute hypoxic respiratory failure (HCC)  COPD exacerbation [facility])    ED Discharge Orders     None          Fusi Duwaine LITTIE, DO 03/22/24 1628

## 2024-03-22 NOTE — ED Notes (Signed)
 2nd Trop & Lactic due @1236 

## 2024-03-22 NOTE — Progress Notes (Signed)
 Pharmacy Antibiotic Note  Brittany Irwin is a 83 y.o. female admitted on 03/22/2024 with pneumonia.  Pharmacy has been consulted for Vancomycin  and Cefepime  dosing.  Plan: Cefepime  2gm IV q24h Vancomycin  1gm load given in ED, ordered 500mg  to complete 1500mg  IV load.  Will f/u Scr in a.m. to establish maintenance dosing - may need to get random vanc level if SCr not improved F/u micro data, and pt's clinical condition   Height: 5' 4 (162.6 cm) Weight: 72.8 kg (160 lb 7.9 oz) IBW/kg (Calculated) : 54.7  No data recorded.  Recent Labs  Lab 03/22/24 0923 03/22/24 1036  WBC 12.8*  --   CREATININE  --  2.10*  LATICACIDVEN  --  1.9    Estimated Creatinine Clearance: 20.5 mL/min (A) (by C-G formula based on SCr of 2.1 mg/dL (H)).    Allergies  Allergen Reactions   Methylprednisolone  Other (See Comments)    Increased BP, HR, agitation, hallucinations    Shellfish Allergy Nausea And Vomiting    All SEAFOOD   Ativan  [Lorazepam ] Other (See Comments)    Psychosis   Azulfidine [Sulfasalazine] Other (See Comments)    Headache    Epipen [Epinephrine] Hypertension   Flagyl [Metronidazole] Hives   Motrin [Ibuprofen] Nausea Only and Other (See Comments)    Told not to take medication due to GI distress caused by crohn's disease.   Nsaids Other (See Comments)    Told not to take NSAIDs due to GI distress caused by crohn's disease   Penicillins Hives    12/2021, 11/2023 tolerated cephalosporins    Morphine And Codeine  Itching    Told not to take due to GI distress caused by crohn's disease    Antimicrobials this admission: 9/3 Vanc >>  9/3 Cefepime  >>   Microbiology results: 9/3 BCx:   Thank you for allowing pharmacy to be a part of this patient's care.  Vito Ralph, PharmD, BCPS Please see amion for complete clinical pharmacist phone list 03/22/2024 1:01 PM

## 2024-03-22 NOTE — ED Notes (Signed)
 2ndLactic due @1236 

## 2024-03-22 NOTE — H&P (Addendum)
 History and Physical    Patient: Brittany Irwin FMW:[id number] DOB: [birth year] DOA: 03/22/2024 DOS: the patient was seen and examined on 03/22/2024 . PCP: [name], MD  Patient coming from: Home Chief complaint: Chief Complaint  Patient presents with   Code Sepsis   HPI:  Brittany Irwin is a 83 y.o. female with past medical history  of  COPD, chronic respiratory failure on 4 L, HfpEF, CAD, tachybradycardia syndrome s/p permanent pacemaker, Crohn's disease, SBO, psoriasis, migraine headaches, CKD stage III, and anxiety, presenting again for lethargy fever and hypoxia that started early this morning.  Patient was admitted in July for UTI, she was admitted on 11 August for shortness of breath with sepsis secondary to pneumonia. Chart review shows that patient was seen by Dr. Rosabel yesterday and his note shows that patient believed she was having diarrhea after starting Thick-It and so she stopped using it although she use it a day ago.  Patient also has pharyngeal dysphagia and was advised to follow an antireflux diet eat small frequent meals and use Thick-It to thicken foods.  Patient has had a CT angio chest in August that was negative for pulmonary embolism and patient is also on blood thinners for her A-fib.  Based on chart review it seems patient is having recurrent episodes of pneumonia multifocal pneumonia that I suspect is from silent aspiration intermittent aspiration and recurrent aspiration patient has had head CT that was negative for any acute findings few weeks ago.  Patient does not have any history of strokes.  ED Course:  Vital signs in the ED were notable for the following:  Vitals:   03/22/24 1757 03/22/24 1929 03/22/24 2014 03/22/24 2016  BP: 122/69 103/71    Pulse: 63 80    Temp: 98 F (36.7 C) 98 F (36.7 C)    Resp: 14 18    Height: 5' 4 (1.626 m)     Weight: 73.9 kg     SpO2: 93% 96% 95% 97%  TempSrc: Oral Oral    BMI (Calculated): 27.95      >>ED  evaluation thus far shows: -Initial ABG showed a PO2 of 64 pH of 7.438 and pCO2 42.3. -Initial BMP showing sodium 140 potassium 3.9 glucose 112 BUN of 40 creatinine 2.10, LFTs within normal limit except for bili of 0.3. -BNP of 402.3, troponin of 205 and repeat troponin of 2.9.EKG showing A-fib at 84 with a right bundle branch block QRS of 154 anteroseptal Q waves. - 03/04/2024 2 D Echo:  1. Left ventricular ejection fraction, by estimation, is 60 to 65%. The  left ventricle has normal function. The left ventricle has no regional  wall motion abnormalities. Left ventricular diastolic parameters are  indeterminate.   2. Right ventricular systolic function is normal. The right ventricular  size is normal.   3. Mild mitral valve regurgitation.   4. The aortic valve is tricuspid. Aortic valve regurgitation is mild.   5. The inferior vena cava is dilated in size with <50% respiratory  variability, suggesting right atrial pressure of 15 mmHg.    >>While in the ED patient received the following: LR bolus 500 cc, vancomycin , Tylenol , DuoNeb.  Review of Systems  Respiratory:  Positive for shortness of breath.   All other systems reviewed and are negative.  Past Medical History:  Diagnosis Date   Acute CHF (congestive heart failure) (HCC) 01/02/2022   AKI (acute kidney injury) (HCC) 09/08/2020   Aspiration pneumonia (HCC) 01/05/2019   Atypical chest  pain 03/11/2022   Bradycardia 03/14/2020   C. difficile colitis 04/03/2021   CHF (congestive heart failure) (HCC)    COPD (chronic obstructive pulmonary disease) (HCC)    Coronary artery disease    Tachycardia 05/31/2013   Past Surgical History:  Procedure Laterality Date   ABDOMINAL HYSTERECTOMY     BOWEL RESECTION     CHOLECYSTECTOMY      reports that she has quit smoking. Her smoking use included cigarettes. She does not have any smokeless tobacco history on file. She reports that she does not currently use alcohol . She reports that she  does not use drugs. Allergies  Allergen Reactions   Methylprednisolone  Other (See Comments)    Increased BP, HR, agitation, hallucinations    Shellfish Allergy Nausea And Vomiting    All SEAFOOD   Ativan  [Lorazepam ] Other (See Comments)    Psychosis   Azulfidine [Sulfasalazine] Other (See Comments)    Headache    Epipen [Epinephrine] Hypertension   Flagyl [Metronidazole] Hives   Motrin [Ibuprofen] Nausea Only and Other (See Comments)    Told not to take medication due to GI distress caused by crohn's disease.   Nsaids Other (See Comments)    Told not to take NSAIDs due to GI distress caused by crohn's disease   Penicillins Hives    12/2021, 11/2023 tolerated cephalosporins    Morphine And Codeine  Itching    Told not to take due to GI distress caused by crohn's disease   History reviewed. No pertinent family history. Prior to Admission medications   Medication Sig Start Date End Date Taking? Authorizing Provider  acetaminophen -codeine  (TYLENOL  #4) 300-60 MG tablet Take 1 tablet by mouth every 6 (six) hours as needed for pain. 12/31/21   [provider]  ALPRAZolam  (XANAX ) 1 MG tablet Take 1 mg by mouth 2 (two) times daily. 01/02/22   [provider]  apixaban  (ELIQUIS ) 5 MG TABS tablet Take 5 mg by mouth 2 (two) times daily.    [provider]  atenolol  (TENORMIN ) 25 MG tablet Take 1 tablet (25 mg total) by mouth daily. 02/16/24   Sheikh, Omair Latif, DO  cyanocobalamin (,VITAMIN B-12,) 1000 MCG/ML injection Inject 1,000 mcg into the muscle every 30 (thirty) days.    [provider]  cyclobenzaprine  (FLEXERIL ) 5 MG tablet Take 5 mg by mouth daily as needed for muscle spasms. 10/13/22   [provider]  diclofenac  Sodium (VOLTAREN  ARTHRITIS PAIN) 1 % GEL Apply 1 Application topically in the morning and at bedtime.    [provider]  ergocalciferol  (VITAMIN D2) 1.25 MG (50000 UT) capsule Take 50,000 Units by mouth once a week. Every  thursday    [provider]  ezetimibe  (ZETIA ) 10 MG tablet Take 10 mg by mouth at bedtime. 12/31/21   [provider]  fenofibrate  micronized (LOFIBRA) 200 MG capsule Take 200 mg by mouth daily. 12/31/21   [provider]  fluconazole  (DIFLUCAN ) 150 MG tablet Take 150 mg by mouth daily.    [provider]  Fluticasone -Umeclidin-Vilant (TRELEGY ELLIPTA ) 200-62.5-25 MCG/ACT AEPB Inhale 1 puff into the lungs daily. 12/22/23     furosemide  (LASIX ) 40 MG tablet Take 1 tablet (40 mg total) by mouth daily. 03/05/24 03/05/25  Singh, Prashant K, MD  levalbuterol  (XOPENEX ) 0.63 MG/3ML nebulizer solution Take 3 mLs (0.63 mg total) by nebulization every 4 (four) hours as needed for wheezing or shortness of breath. 09/11/23 02/28/24  Christobal Guadalajara, MD  loratadine  (CLARITIN ) 10 MG tablet Take 1  tablet (10 mg total) by mouth daily for 14 days. Patient taking differently: Take 10 mg by mouth daily as needed for allergies. 09/12/23 02/28/24  Christobal Guadalajara, MD  Menthol, Topical Analgesic, (ICY HOT BACK EX) Apply 1 Application topically 2 (two) times daily as needed (back pain).    [provider]  nitroGLYCERIN  (NITROSTAT ) 0.4 MG SL tablet Place 0.4 mg under the tongue every 5 (five) minutes x 3 doses as needed for chest pain. 12/31/21   [provider]  omeprazole (PRILOSEC) 40 MG capsule Take 40 mg by mouth daily.    [provider]  pregabalin  (LYRICA ) 50 MG capsule Take 50 mg by mouth 2 (two) times daily.    [provider]  spironolactone  (ALDACTONE ) 25 MG tablet Take 0.5 tablets (12.5 mg total) by mouth daily. 03/05/24 04/04/24  Dennise Lavada POUR, MD  [facility]) POWD Make liquids nectar thick. Kindly dispense any thickening product to make liquids nectar thick consistency, 1 month supply no refills. 03/05/24   Dennise Lavada POUR, MD                                                                                 Vitals:   03/22/24 1757  03/22/24 1929 03/22/24 2014 03/22/24 2016  BP: 122/69 103/71    Pulse: 63 80    Resp: 14 18    Temp: 98 F (36.7 C) 98 F (36.7 C)    TempSrc: Oral Oral    SpO2: 93% 96% 95% 97%  Weight: 73.9 kg     Height: 5' 4 (1.626 m)      Physical Exam Vitals reviewed.  Constitutional:      General: She is not in acute distress.    Appearance: She is not ill-appearing.     Interventions: Nasal cannula in place.  HENT:     Head: Normocephalic.  Eyes:     Extraocular Movements: Extraocular movements intact.  Cardiovascular:     Rate and Rhythm: Normal rate and regular rhythm.     Pulses: Normal pulses.     Heart sounds: Normal heart sounds.  Pulmonary:     Breath sounds: Rales present.  Abdominal:     General: There is no distension.     Palpations: Abdomen is soft.     Tenderness: There is no abdominal tenderness.  Musculoskeletal:     Right lower leg: No edema.     Left lower leg: No edema.  Neurological:     General: No focal deficit present.     Mental Status: She is alert and oriented to person, place, and time.  Psychiatric:        Behavior: Behavior is cooperative.     Labs on Admission: I have personally reviewed following labs and imaging studies CBC: Recent Labs  Lab 03/22/24 0923 03/22/24 0947 03/22/24 1000 03/22/24 1036  WBC 12.8*  --   --   --   NEUTROABS 11.5*  --   --   --   HGB 12.9 12.6 12.6 12.6  HCT 40.3 37.0 37.0 37.0  MCV 91.8  --   --   --   PLT 179  --   --   --  Basic Metabolic Panel: Recent Labs  Lab 03/22/24 0947 03/22/24 1000 03/22/24 1036  NA 137 138 140  K 3.9 3.8 3.9  CL  --   --  98  GLUCOSE  --   --  112*  BUN  --   --  40*  CREATININE  --   --  2.10*   GFR: Estimated Creatinine Clearance: 20.7 mL/min (A) (by C-G formula based on SCr of 2.1 mg/dL (H)). Liver Function Tests: Recent Labs  Lab 03/22/24 0923  AST 33  ALT 11  ALKPHOS 34*  BILITOT 0.9  PROT 7.3  ALBUMIN 3.9   Recent Labs  Lab 03/22/24 0923  LIPASE 36    No results for input(s): AMMONIA in the last 168 hours. Recent Labs    10/02/23 0417 02/14/24 0337 02/15/24 0620 02/28/24 0228 02/29/24 0518 03/02/24 0518 03/03/24 0831 03/04/24 1002 03/05/24 0540 03/22/24 1036  BUN 23 23 19  27* 20 19 30* 42* 52* 40*  CREATININE 0.88 1.31* 0.86 1.36* 1.08* 1.04* 1.24* 1.19* 1.33* 2.10*    Estimated Creatinine Clearance: 20.7 mL/min (A) (by C-G formula based on SCr of 2.1 mg/dL (H)).   Recent Labs    10/01/23 0404 10/02/23 0417 02/14/24 0337 02/15/24 0620 02/28/24 0228 02/29/24 0518 03/02/24 0518 03/03/24 0831 03/04/24 1002 03/05/24 0540 03/22/24 1036  BUN 26* 23 23 19  27* 20 19 30* 42* 52* 40*  CREATININE 1.02* 0.88 1.31* 0.86 1.36* 1.08* 1.04* 1.24* 1.19* 1.33* 2.10*  CO2 24 24 26 24 28 27 28 24 24  27  --    Cardiac Enzymes: No results for input(s): CKTOTAL, CKMB, CKMBINDEX, TROPONINI in the last 168 hours. BNP (last 3 results) No results for input(s): PROBNP in the last 8760 hours. HbA1C: No results for input(s): HGBA1C in the last 72 hours. CBG: No results for input(s): GLUCAP in the last 168 hours. Lipid Profile: No results for input(s): CHOL, HDL, LDLCALC, TRIG, CHOLHDL, LDLDIRECT in the last 72 hours. Thyroid  Function Tests: No results for input(s): TSH, T4TOTAL, FREET4, T3FREE, THYROIDAB in the last 72 hours. Anemia Panel: No results for input(s): VITAMINB12, FOLATE, FERRITIN, TIBC, IRON, RETICCTPCT in the last 72 hours. Urine analysis:    Component Value Date/Time   COLORURINE YELLOW 03/22/2024 1212   APPEARANCEUR CLEAR 03/22/2024 1212   LABSPEC 1.017 03/22/2024 1212   PHURINE 5.0 03/22/2024 1212   GLUCOSEU NEGATIVE 03/22/2024 1212   HGBUR NEGATIVE 03/22/2024 1212   BILIRUBINUR NEGATIVE 03/22/2024 1212   KETONESUR NEGATIVE 03/22/2024 1212   PROTEINUR NEGATIVE 03/22/2024 1212   NITRITE NEGATIVE 03/22/2024 1212   LEUKOCYTESUR TRACE (A) 03/22/2024 1212    Radiological Exams on Admission: CT Chest Wo Contrast Result Date: 03/22/2024 CLINICAL DATA:  Shortness of breath and cough.  Hypoxia. EXAM: CT CHEST WITHOUT CONTRAST TECHNIQUE: Multidetector CT imaging of the chest was performed following the standard protocol without IV contrast. RADIATION DOSE REDUCTION: This exam was performed according to the departmental dose-optimization program which includes automated exposure control, adjustment of the mA and/or kV according to patient size and/or use of iterative reconstruction technique. COMPARISON:  Chest CT dated 02/28/2024. FINDINGS: Evaluation of this exam is limited in the absence of intravenous contrast. Cardiovascular: There is no cardiomegaly or pericardial effusion. There is coronary vascular calcification and calcification of the mitral annulus. Left pectoral pacemaker device. Mild atherosclerotic calcification of the thoracic aorta. No aneurysmal dilatation. There is mildly dilated main pulmonary trunk suggestive of pulmonary hypertension. Mediastinum/Nodes: No hilar adenopathy. Subcarinal lymph node measures 9 mm in short  axis. A lymph node anterior to the carina measures 12 mm short axis. The esophagus is grossly unremarkable no mediastinal fluid collection. Lungs/Pleura: Background of emphysema. Similar appearance of consolidative changes of the right middle lobe with air bronchogram, likely atelectasis. Linear and bandlike atelectasis/scarring at the lung bases. Faint areas of ground-glass density in the right lower lobe and to a lesser degree in the right upper lobe may be chronic and sequela of prior inflammatory process or represent residual or recurrent atypical pneumonia. The previously seen 5 mm nodule in the posteromedial right lung base is not visualized on today's exam. No pleural effusion or pneumothorax. The central airways are patent. Upper Abdomen: No acute abnormality. Musculoskeletal: Osteopenia with degenerative changes. No acute  osseous pathology. IMPRESSION: 1. Faint areas of ground-glass density in the right lower lobe and to a lesser degree in the right upper lobe may be chronic and sequela of prior inflammatory process or represent residual or recurrent atypical pneumonia. 2. Similar appearance of consolidative changes of the right middle lobe with air bronchogram, likely atelectasis. 3. Aortic Atherosclerosis (ICD10-I70.0) and Emphysema (ICD10-J43.9). Electronically Signed   By: Vanetta Chou M.D.   On: 03/22/2024 14:41   DG Chest 1 View Result Date: 03/22/2024 CLINICAL DATA:  Shortness of breath, sepsis. EXAM: CHEST  1 VIEW COMPARISON:  03/03/2024 and CT chest 02/28/2024. FINDINGS: Patient is slightly rotated. Trachea is midline. Heart is enlarged, stable. Pacemaker lead tips are in the right atrium and right ventricle. Minimal streaky atelectasis in the lung bases. No pleural fluid. IMPRESSION: Minimal bibasilar streaky atelectasis. Electronically Signed   By: Newell Eke M.D.   On: 03/22/2024 10:57   Data Reviewed: Relevant notes from primary care and specialist visits, past discharge summaries as available in EHR, including Care Everywhere . Prior diagnostic testing as pertinent to current admission diagnoses, Updated medications and problem lists for reconciliation .ED course, including vitals, labs, imaging, treatment and response to treatment,Triage notes, nursing and pharmacy notes and ED provider's notes.Notable results as noted in HPI.Discussed case with EDMD/ ED APP/ or Specialty MD on call and as needed.  Assessment & Plan  >> Sepsis/pneumonia: Patient woke up lethargic and confused this morning shortness of breath this morning.  Brought in by sister at bedside.  Patient had similar episode of pneumonia a few weeks ago.  CT done today also shows groundglass density in right lower lobe and right upper lobe.  Patient changed to HCAP coverage regimen with vancomycin  and cefepime  pharmacy consult and renally  dose. Gentle IV fluid hydration.  As patient and fall precautions.  Heart healthy diet with thickened liquids.   >> HFrEF: Clinically patient does have some basilar rales bilaterally, elevated BNP at 402.3 however clinically she is euvolemic. Have currently held her Lasix .  Due to AKI and low blood pressure.   >> Tachybradycardia syndrome status post pacemaker: Patient has a [employer] pacemaker placed in 2024 August.  Patient follows with Dr. Kreg cardiologist at [facility].   >>NSTEMI: Stable patient currently denies any chest pain or pressure.  Elevated troponin of 205 and 219, which I suspect is most likely due to increased demand.  Continue patient on Eliquis , Zetia , as needed nitroglycerin .  Cardiology consult as deemed appropriate.   >> AKI on CKD stage IIIa: Lab Results  Component Value Date   CREATININE 2.10 (H) 03/22/2024   CREATININE 1.33 (H) 03/05/2024   CREATININE 1.19 (H) 03/04/2024  Avoid contrast studies and renally dose needed medications.  Currently will hold patient's Lasix ,  Aldactone ,and beta blocker.     >> Chronic hypoxic respiratory failure/COPD: SpO2: 97 % O2 Flow Rate (L/min): 5 L/min IS/Flutter valve. Duonebs/ albuterol  PRN.   >> Essential hypertension: Vitals:   03/22/24 0920 03/22/24 1703 03/22/24 1757 03/22/24 1929  BP: (!) 144/60 (!) 108/50 122/69 103/71  Currently we will hold patient's atenolol , Lasix , Aldactone .   >> History of Crohn's disease: Patient reports diarrhea states that antibiotics do that to her.  We will order a stool panel for C. difficile along with GI PCR.  Patient started on probiotics.    DVT prophylaxis:  Eliquis .  Consults:  None.  Advance Care Planning:    Code Status: Full Code   Family Communication:  Sister  Disposition Plan:  Home.  Severity of Illness: The appropriate patient status for this patient is INPATIENT. Inpatient status is judged to be reasonable and necessary in order to provide the  required intensity of service to ensure the patient's safety. The patient's presenting symptoms, physical exam findings, and initial radiographic and laboratory data in the context of their chronic comorbidities is felt to place them at high risk for further clinical deterioration. Furthermore, it is not anticipated that the patient will be medically stable for discharge from the hospital within 2 midnights of admission.   * I certify that at the point of admission it is my clinical judgment that the patient will require inpatient hospital care spanning beyond 2 midnights from the point of admission due to high intensity of service, high risk for further deterioration and high frequency of surveillance required.*  Unresulted Labs (From admission, onward)     Start     Ordered   03/23/24 0500  Comprehensive metabolic panel  Tomorrow morning,   R        03/22/24 1257   03/23/24 0500  CBC  Tomorrow morning,   R        03/22/24 1257   03/22/24 1921  MRSA Next Gen by PCR, Nasal  (MRSA Screening)  Once,   R       Question:  Patient immune status  Answer:  Immunocompromised   03/22/24 1920   03/22/24 1831  Expectorated Sputum Assessment w Gram Stain, Rflx to Resp Cult  Once,   R        03/22/24 1830   03/22/24 1405  Gastrointestinal Panel by PCR , Stool  (Gastrointestinal Panel by PCR, Stool                                                                                                                                                     **Does Not include CLOSTRIDIUM DIFFICILE testing. **If CDIFF testing is needed, place order from the C Difficile Testing order set.**)  Once,   R        03/22/24 1404   03/22/24 1405  C Difficile Quick Screen w PCR reflex  (C Difficile quick screen w PCR reflex panel )  Once, for 24 hours,   TIMED       References:    CDiff Information Tool   03/22/24 1404   03/22/24 0924  Culture, blood (routine x 2)  BLOOD CULTURE X 2,   R      03/22/24 9076            Meds  ordered this encounter  Medications   DISCONTD: sodium chloride  0.9 % bolus 500 mL   acetaminophen  (TYLENOL ) tablet 650 mg   DISCONTD: lactated ringers  bolus 1,000 mL    Enter Patient Weight in Kilograms:   72.8   DISCONTD: lactated ringers  bolus 1,000 mL    Enter Patient Weight in Kilograms:   72.8   DISCONTD: lactated ringers  bolus 250 mL    Enter Patient Weight in Kilograms:   72.8   ceFEPIme  (MAXIPIME ) 2 g in sodium chloride  0.9 % 100 mL IVPB    Antibiotic Indication::   Other Indication (list below)    Other Indication::   Unknown Source.   vancomycin  (VANCOCIN ) IVPB 1000 mg/200 mL premix    Indication::   Other Indication (list below)    Other Indication::   Unknown Source.   ipratropium-albuterol  (DUONEB) 0.5-2.5 (3) MG/3ML nebulizer solution 3 mL   DISCONTD: methylPREDNISolone  sodium succinate (SOLU-MEDROL ) 125 mg/2 mL injection 125 mg    IV methylprednisolone  will be converted to either a q12h or q24h frequency with the same total daily dose (TDD).  Ordered Dose: 1 to 125 mg TDD; convert to: TDD q24h.  Ordered Dose: 126 to 250 mg TDD; convert to: TDD div q12h.  Ordered Dose: >250 mg TDD; DAW.   lactated ringers  bolus 500 mL    Enter Patient Weight in Kilograms:   72.8   0.9 %  sodium chloride  infusion   vancomycin  (VANCOREADY) IVPB 500 mg/100 mL    Indication::   Sepsis   DISCONTD: vancomycin  variable dose per unstable renal function (pharmacist dosing)   DISCONTD: ceFEPIme  (MAXIPIME ) 2 g in sodium chloride  0.9 % 100 mL IVPB    Antibiotic Indication::   Sepsis   apixaban  (ELIQUIS ) tablet 5 mg   budesonide -glycopyrrolate -formoterol  (BREZTRI ) 160-9-4.8 MCG/ACT inhaler 2 puff   levalbuterol  (XOPENEX ) nebulizer solution 0.63 mg   pregabalin  (LYRICA ) capsule 50 mg   acidophilus (RISAQUAD) capsule 1 capsule   ceFEPIme  (MAXIPIME ) 2 g in sodium chloride  0.9 % 100 mL IVPB    Antibiotic Indication::   Sepsis   ipratropium-albuterol  (DUONEB) 0.5-2.5 (3) MG/3ML nebulizer solution 3  mL   ezetimibe  (ZETIA ) tablet 10 mg   diclofenac  Sodium (VOLTAREN ) 1 % topical gel 2 g   nitroGLYCERIN  (NITROSTAT ) SL tablet 0.4 mg     Orders Placed This Encounter  Procedures   Culture, blood (routine x 2)   Resp panel by RT-PCR (RSV, Flu A&B, Covid) Anterior Nasal Swab   Gastrointestinal Panel by PCR , Stool   C Difficile Quick Screen w PCR reflex   Expectorated Sputum Assessment w Gram Stain, Rflx to Resp Cult   MRSA Next Gen by PCR, Nasal   DG Chest 1 View   CT Chest Wo Contrast   CBC with Differential   Lipase, blood   Hepatic function panel   Brain natriuretic peptide   Urinalysis, Routine w reflex microscopic -Urine, Catheterized   HIV Antibody (routine testing w rflx)   Comprehensive metabolic panel   CBC   Procalcitonin  Diet Heart Room service appropriate? Yes with Assist; Fluid consistency: Honey Thick; Fluid restriction: 1200 mL Fluid   ED Cardiac monitoring   Document height and weight   Assess and Document Glasgow Coma Scale   Document vital signs within 1-hour of fluid bolus completion. Notify provider of abnormal vital signs despite fluid resuscitation.   DO NOT delay antibiotics if unable to obtain blood culture.   Refer to Sidebar Report: Sepsis Sidebar ED/IP   Notify provider for difficulties obtaining IV access.   Insert peripheral IV x 2   Initiate Carrier Fluid Protocol   Vital signs   Notify physician (specify) Before calling MD verify blood pressure manually.   Refer to Sidebar Report Mobility Protocol for Adult Inpatient   Complete Pneumococcal/Influenza Immunization assessment   Intake and Output   Check Pulse Oximetry while ambulating   Apply Pneumonia Care Plan   Elevate head of bed   If diabetic or glucose greater than 140 notify MD to place Glycemic Control Order Set   Maintain IV access   Initiate Oral Care Protocol   Initiate Carrier Fluid Protocol   Turn, cough, deep breathe, Incentive spirometry every 2 hours while awake.   Nurse to  provide smoking / tobacco cessation education   RN may order General Admission PRN Orders utilizing General Admission PRN medications (through manage orders) for the following patient needs: allergy symptoms (Claritin ), cold sores (Carmex), cough (Robitussin DM), eye irritation (Liquifilm Tears), hemorrhoids (Tucks), indigestion (Maalox), minor skin irritation (Hydrocortisone Cream), muscle pain Lucienne Gay), nose irritation (saline nasal spray) and sore throat (Chloraseptic spray).   Ambulate with assistance   Cardiac Monitoring - Continuous Indefinite   Strict intake and output   Full code   Code Sepsis activation.  This occurs automatically when order is signed and prioritizes pharmacy, lab, and radiology services for STAT collections and interventions.  If [facility] downtime, call [facility] [phone]) to activate Code Sepsis.   Consult for Unassigned Medical Admission   Consult to hospitalist   vancomycin  per pharmacy consult   CeFEPIme  (MAXIPIME ) per pharmacy consult            Enteric precautions (UV disinfection)   ED Pulse oximetry, continuous   Oxygen therapy Mode or (Route): High flow nasal cannula; FiO2: 40%   Oxygen therapy Mode or (Route): Nasal cannula; Liters Per Minute: 2; Keep O2 saturation between: greater than 92 %   Flutter valve   Incentive spirometry RT   I-stat chem 8, ED   I-Stat Lactic Acid   I-Stat arterial blood gas, ED   I-Stat venous blood gas, ED   EKG 12-Lead   ED EKG   EKG 12-Lead   12 lead EKG   EKG 12-Lead   Admit to Inpatient (patient's expected length of stay will be greater than 2 midnights or inpatient only procedure)   Aspiration precautions   Fall precautions    Author: Mario LULLA Blanch, MD 12 pm- 8 pm. [facility]. 03/22/2024 8:54 PM Please note for any communication after hours contact [facility] Assigned provider on call on Amion.

## 2024-03-22 NOTE — ED Triage Notes (Addendum)
 Patient BIB EMS from home as a code sepsis. Patient was recently diagnosed with pneumonia and just finished her medications. Patient was just at the doctor's yesterday and was fine. Patient woke up this morning with chills and just a general feeling of not feeling well. Patient is extremely confused. Patient is [facility] with EMS. Patient has a history of COPD and is on 4L [place] at home, however needed additional oxygen with EMS and fire. Patient satting 79% on room air.

## 2024-03-22 NOTE — ED Notes (Signed)
 CCMD called.

## 2024-03-22 NOTE — Sepsis Progress Note (Signed)
 Elink will follow per sepsis protocol.

## 2024-03-23 DIAGNOSIS — J189 Pneumonia, unspecified organism: Secondary | ICD-10-CM | POA: Diagnosis not present

## 2024-03-23 DIAGNOSIS — I10 Essential (primary) hypertension: Secondary | ICD-10-CM | POA: Diagnosis not present

## 2024-03-23 DIAGNOSIS — J9601 Acute respiratory failure with hypoxia: Secondary | ICD-10-CM | POA: Diagnosis not present

## 2024-03-23 DIAGNOSIS — A419 Sepsis, unspecified organism: Secondary | ICD-10-CM | POA: Diagnosis not present

## 2024-03-23 LAB — CBC
HCT: 33.2 % — ABNORMAL LOW (ref 36.0–46.0)
Hemoglobin: 10.6 g/dL — ABNORMAL LOW (ref 12.0–15.0)
MCH: 29.3 pg (ref 26.0–34.0)
MCHC: 31.9 g/dL (ref 30.0–36.0)
MCV: 91.7 fL (ref 80.0–100.0)
Platelets: 138 K/uL — ABNORMAL LOW (ref 150–400)
RBC: 3.62 MIL/uL — ABNORMAL LOW (ref 3.87–5.11)
RDW: 13.7 % (ref 11.5–15.5)
WBC: 5.5 K/uL (ref 4.0–10.5)
nRBC: 0 % (ref 0.0–0.2)

## 2024-03-23 LAB — COMPREHENSIVE METABOLIC PANEL WITH GFR
ALT: 10 U/L (ref 0–44)
AST: 26 U/L (ref 15–41)
Albumin: 3.1 g/dL — ABNORMAL LOW (ref 3.5–5.0)
Alkaline Phosphatase: 29 U/L — ABNORMAL LOW (ref 38–126)
Anion gap: 14 (ref 5–15)
BUN: 34 mg/dL — ABNORMAL HIGH (ref 8–23)
CO2: 26 mmol/L (ref 22–32)
Calcium: 9 mg/dL (ref 8.9–10.3)
Chloride: 103 mmol/L (ref 98–111)
Creatinine, Ser: 1.3 mg/dL — ABNORMAL HIGH (ref 0.44–1.00)
GFR, Estimated: 41 mL/min — ABNORMAL LOW (ref >60)
Glucose, Bld: 114 mg/dL — ABNORMAL HIGH (ref 70–99)
Potassium: 3.6 mmol/L (ref 3.5–5.1)
Sodium: 143 mmol/L (ref 135–145)
Total Bilirubin: 0.9 mg/dL (ref 0.0–1.2)
Total Protein: 5.9 g/dL — ABNORMAL LOW (ref 6.5–8.1)

## 2024-03-23 LAB — GASTROINTESTINAL PANEL BY PCR, STOOL (REPLACES STOOL CULTURE)

## 2024-03-23 MED ORDER — VITAMIN D (ERGOCALCIFEROL) 1.25 MG (50000 UNIT) PO CAPS
50000.0000 [IU] | ORAL_CAPSULE | ORAL | Status: DC
  Administered 2024-03-23: 50000 [IU] via ORAL
  Filled 2024-03-23: qty 1

## 2024-03-23 MED ORDER — OXYCODONE HCL 5 MG PO TABS
5.0000 mg | ORAL_TABLET | Freq: Four times a day (QID) | ORAL | Status: DC | PRN
  Administered 2024-03-23 – 2024-03-26 (×9): 5 mg via ORAL
  Filled 2024-03-23 (×9): qty 1

## 2024-03-23 MED ORDER — FENOFIBRATE 54 MG PO TABS
54.0000 mg | ORAL_TABLET | Freq: Every day | ORAL | Status: DC
  Administered 2024-03-23 – 2024-03-26 (×4): 54 mg via ORAL
  Filled 2024-03-23 (×5): qty 1

## 2024-03-23 MED ORDER — ERGOCALCIFEROL 1.25 MG (50000 UT) PO CAPS: 50000.0000 [IU] | ORAL_CAPSULE | ORAL | Status: DC

## 2024-03-23 MED ORDER — SPIRONOLACTONE 12.5 MG HALF TABLET
12.5000 mg | ORAL_TABLET | Freq: Every day | ORAL | Status: DC
  Administered 2024-03-23 – 2024-03-26 (×4): 12.5 mg via ORAL
  Filled 2024-03-23 (×4): qty 1

## 2024-03-23 MED ORDER — ACETAMINOPHEN-CODEINE 300-30 MG PO TABS
2.0000 | ORAL_TABLET | Freq: Three times a day (TID) | ORAL | Status: DC | PRN
  Administered 2024-03-23: 2 via ORAL
  Filled 2024-03-23: qty 2

## 2024-03-23 MED ORDER — LORATADINE 10 MG PO TABS: 10.0000 mg | ORAL_TABLET | Freq: Every day | ORAL | Status: DC | PRN

## 2024-03-23 MED ORDER — PANTOPRAZOLE SODIUM 40 MG PO TBEC
40.0000 mg | DELAYED_RELEASE_TABLET | Freq: Every day | ORAL | Status: DC
  Administered 2024-03-23 – 2024-03-26 (×4): 40 mg via ORAL
  Filled 2024-03-23 (×4): qty 1

## 2024-03-23 MED ORDER — ALPRAZOLAM 0.5 MG PO TABS
1.0000 mg | ORAL_TABLET | Freq: Two times a day (BID) | ORAL | Status: DC
  Administered 2024-03-23 – 2024-03-26 (×8): 1 mg via ORAL
  Filled 2024-03-23 (×8): qty 2

## 2024-03-23 MED ORDER — CYCLOBENZAPRINE HCL 10 MG PO TABS
5.0000 mg | ORAL_TABLET | Freq: Every day | ORAL | Status: DC | PRN
  Administered 2024-03-26: 5 mg via ORAL
  Filled 2024-03-23: qty 1

## 2024-03-23 MED ORDER — ATENOLOL 25 MG PO TABS
25.0000 mg | ORAL_TABLET | Freq: Every day | ORAL | Status: DC
  Administered 2024-03-23 – 2024-03-26 (×4): 25 mg via ORAL
  Filled 2024-03-23 (×4): qty 1

## 2024-03-23 NOTE — Progress Notes (Signed)
 Mobility Specialist Progress Note;   03/23/24 0929  Mobility  Activity Ambulated with assistance  Level of Assistance Standby assist, set-up cues, supervision of patient - no hands on  Assistive Device Four wheel walker  Distance Ambulated (ft) 400 ft  Activity Response Tolerated well  Mobility Referral Yes  Mobility visit 1 Mobility  Mobility Specialist Start Time (ACUTE ONLY) [id number]  Mobility Specialist Stop Time (ACUTE ONLY) 0954  Mobility Specialist Time Calculation (min) (ACUTE ONLY) 25 min   Pt eager for mobility. On 4LO2 upon arrival. Required little to no physical assistance during ambulation, SV for safety. Ambulated on 4LO2, SPO2 91-93%. Requested to sit in chair at [facility]. Pt left with all needs met, call bell in reach. Alarm on.   Lauraine Erm Mobility Specialist Please contact via SecureChat or [employer] [phone]

## 2024-03-23 NOTE — Progress Notes (Signed)
 Overnight floor coverage  Informed by RN that patient is very anxious and requesting Xanax .  She takes Xanax  1 mg twice daily at home.  Medication ordered per patient request.

## 2024-03-23 NOTE — Plan of Care (Signed)
  Problem: Education: Goal: Knowledge of General Education information will improve Description: Including pain rating scale, medication(s)/side effects and non-pharmacologic comfort measures Outcome: Progressing   Problem: Health Behavior/Discharge Planning: Goal: Ability to manage health-related needs will improve Outcome: Progressing   Problem: Clinical Measurements: Goal: Will remain free from infection Outcome: Progressing   Problem: Clinical Measurements: Goal: Diagnostic test results will improve Outcome: Progressing   Problem: Clinical Measurements: Goal: Respiratory complications will improve Outcome: Progressing   Problem: Coping: Goal: Level of anxiety will decrease Outcome: Progressing   Problem: Elimination: Goal: Will not experience complications related to bowel motility Outcome: Progressing   Problem: Pain Managment: Goal: General experience of comfort will improve and/or be controlled Outcome: Progressing   Problem: Safety: Goal: Ability to remain free from injury will improve Outcome: Progressing   Problem: Skin Integrity: Goal: Risk for impaired skin integrity will decrease Outcome: Progressing   Problem: Respiratory: Goal: Ability to maintain a clear airway will improve Outcome: Progressing

## 2024-03-23 NOTE — Progress Notes (Signed)
 [facility]                                                                               Brittany Irwin, is a 83 y.o. female, DOB - [birth year], FMW:[id number] Admit date - 03/22/2024    Outpatient Primary MD for the patient is [name], MD  LOS - 1  days    Brief summary   83 y.o. female with past medical history  of  COPD, chronic respiratory failure on 4 L, HfpEF, CAD, tachybradycardia syndrome s/p permanent pacemaker, Crohn's disease, SBO, psoriasis, migraine headaches, CKD stage III, and anxiety, presenting again for lethargy fever and hypoxia that started early this morning.  Patient was admitted in July for UTI, she was admitted on 11 August for shortness of breath with sepsis secondary to pneumonia.   CT chest shows Faint areas of ground-glass density in the right lower lobe and to a lesser degree in the right upper lobe may be chronic and sequela of prior inflammatory process or represent residual or recurrent atypical pneumonia.   Assessment & Plan    Assessment and Plan:  SIRS/ from persistent pneumonia Started the patient on IV antibiotics.  Continue the same. Wean her off the oxygen as appropriate.  Check ambulating oxygen levels on discharge .  UA is negative for infection.  Blood cultures are negative so far.  Wbc normalized.  Procalcitonin is 0.18. lactic acid is wnl.  MRSA pcr is negative. Continue with cefepime .   Acute Kidney Injury  Creatinine is 2.1 on admission, improved to 1.3 today wit IV fluids.  SABRA BNP is 402.     Elevated troponins from demand ischemia from SIRS/ pneumonia.  Pt denies any chest pain.  EKG does not show any ischemic changes.     Diarrhea:  Appears to have resolved.   C diff pcr and GI panel is negative.    H/o Crohns' disease Stable.    Stage 3a CKD Creatinine at baseline around 1.3 and stable.    Estimated body mass index is 27.97 kg/m as calculated from the following:   Height as of this  encounter: 5' 4 (1.626 m).   Weight as of this encounter: 73.9 kg.  Code Status: full code.  DVT Prophylaxis:   apixaban  (ELIQUIS ) tablet 5 mg   Level of Care: Level of care: Progressive Family Communication: none at bedside.   Disposition Plan:     Remains inpatient appropriate:  pending clinical improvement.   Procedures:  None.   Consultants:   None.   Antimicrobials:   Anti-infectives (From admission, onward)    Start     Dose/Rate Route Frequency Ordered Stop   03/23/24 1000  ceFEPIme  (MAXIPIME ) 2 g in sodium chloride  0.9 % 100 mL IVPB  Status:  Discontinued        2 g 200 mL/hr over 30 Minutes Intravenous Every 24 hours 03/22/24 1506 03/22/24 1826   03/23/24 1000  ceFEPIme  (MAXIPIME ) 2 g in sodium chloride  0.9 % 100 mL IVPB        2 g 200 mL/hr over 30 Minutes Intravenous Every 24 hours 03/22/24 1919     03/22/24 1506  vancomycin  variable dose  per unstable renal function (pharmacist dosing)  Status:  Discontinued         Does not apply See admin instructions 03/22/24 1506 03/22/24 1826   03/22/24 1315  vancomycin  (VANCOREADY) IVPB 500 mg/100 mL        500 mg 100 mL/hr over 60 Minutes Intravenous NOW 03/22/24 1303 03/22/24 1518   03/22/24 0930  ceFEPIme  (MAXIPIME ) 2 g in sodium chloride  0.9 % 100 mL IVPB        2 g 200 mL/hr over 30 Minutes Intravenous  Once 03/22/24 0924 03/22/24 1048   03/22/24 0930  vancomycin  (VANCOCIN ) IVPB 1000 mg/200 mL premix        1,000 mg 200 mL/hr over 60 Minutes Intravenous  Once 03/22/24 9075 03/22/24 1153        Medications  Scheduled Meds:  acidophilus  1 capsule Oral TID   ALPRAZolam   1 mg Oral BID   apixaban   5 mg Oral BID   atenolol   25 mg Oral Daily   budesonide -glycopyrrolate -formoterol   2 puff Inhalation BID   diclofenac  Sodium  2 g Topical QID   ezetimibe   10 mg Oral QHS   fenofibrate   54 mg Oral Daily   pantoprazole   40 mg Oral Daily   pregabalin   50 mg Oral BID   spironolactone   12.5 mg Oral Daily   Vitamin D   (Ergocalciferol )  50,000 Units Oral Q7 days   Continuous Infusions:  ceFEPime  (MAXIPIME ) IV 2 g (03/23/24 1251)   PRN Meds:.acetaminophen -codeine , cyclobenzaprine , levalbuterol , loratadine , nitroGLYCERIN     Subjective:   Brittany Irwin was seen and examined today.  No chest pain, some sob, . Back pain. Requesting pain meds.  Objective:   Vitals:   03/23/24 0458 03/23/24 0807 03/23/24 1145 03/23/24 1239  BP: (!) 104/50 (!) 125/51 (!) 127/47 (!) 127/47  Pulse:  67 68 66  Resp:  11 11   Temp: 97.6 F (36.4 C) (!) 97.4 F (36.3 C) 97.6 F (36.4 C)   TempSrc: Oral Oral Oral   SpO2:  97% 96%   Weight:      Height:        Intake/Output Summary (Last 24 hours) at 03/23/2024 1252 Last data filed at 03/23/2024 0400 Gross per 24 hour  Intake 123 ml  Output 300 ml  Net -177 ml   Filed Weights   03/22/24 0933 03/22/24 1757  Weight: 72.8 kg 73.9 kg     Exam General exam: Appears calm and comfortable  Respiratory system: Clear to auscultation. Respiratory effort normal. Cardiovascular system: S1 & S2 heard, RRR.  Gastrointestinal system: Abdomen is nondistended, soft and nontender. Central nervous system: Alert and oriented.  Extremities: no edema.  Skin: No rashes,  Psychiatry: Mood & affect appropriate.     Data Reviewed:  I have personally reviewed following labs and imaging studies   CBC Lab Results  Component Value Date   WBC 5.5 03/23/2024   RBC 3.62 (L) 03/23/2024   HGB 10.6 (L) 03/23/2024   HCT 33.2 (L) 03/23/2024   MCV 91.7 03/23/2024   MCH 29.3 03/23/2024   PLT 138 (L) 03/23/2024   MCHC 31.9 03/23/2024   RDW 13.7 03/23/2024   LYMPHSABS 0.6 (L) 03/22/2024   MONOABS 0.6 03/22/2024   EOSABS 0.1 03/22/2024   BASOSABS 0.1 03/22/2024     Last metabolic panel Lab Results  Component Value Date   NA 143 03/23/2024   K 3.6 03/23/2024   CL 103 03/23/2024   CO2 26 03/23/2024   BUN 34 (  H) 03/23/2024   CREATININE 1.30 (H) 03/23/2024   GLUCOSE 114 (H)  03/23/2024   GFRNONAA 41 (L) 03/23/2024   CALCIUM 9.0 03/23/2024   PHOS 3.3 02/28/2024   PROT 5.9 (L) 03/23/2024   ALBUMIN 3.1 (L) 03/23/2024   BILITOT 0.9 03/23/2024   ALKPHOS 29 (L) 03/23/2024   AST 26 03/23/2024   ALT 10 03/23/2024   ANIONGAP 14 03/23/2024    CBG (last 3)  No results for input(s): GLUCAP in the last 72 hours.    Coagulation Profile: No results for input(s): INR, PROTIME in the last 168 hours.   Radiology Studies: CT Chest Wo Contrast Result Date: 03/22/2024 CLINICAL DATA:  Shortness of breath and cough.  Hypoxia. EXAM: CT CHEST WITHOUT CONTRAST TECHNIQUE: Multidetector CT imaging of the chest was performed following the standard protocol without IV contrast. RADIATION DOSE REDUCTION: This exam was performed according to the departmental dose-optimization program which includes automated exposure control, adjustment of the mA and/or kV according to patient size and/or use of iterative reconstruction technique. COMPARISON:  Chest CT dated 02/28/2024. FINDINGS: Evaluation of this exam is limited in the absence of intravenous contrast. Cardiovascular: There is no cardiomegaly or pericardial effusion. There is coronary vascular calcification and calcification of the mitral annulus. Left pectoral pacemaker device. Mild atherosclerotic calcification of the thoracic aorta. No aneurysmal dilatation. There is mildly dilated main pulmonary trunk suggestive of pulmonary hypertension. Mediastinum/Nodes: No hilar adenopathy. Subcarinal lymph node measures 9 mm in short axis. A lymph node anterior to the carina measures 12 mm short axis. The esophagus is grossly unremarkable no mediastinal fluid collection. Lungs/Pleura: Background of emphysema. Similar appearance of consolidative changes of the right middle lobe with air bronchogram, likely atelectasis. Linear and bandlike atelectasis/scarring at the lung bases. Faint areas of ground-glass density in the right lower lobe and to a  lesser degree in the right upper lobe may be chronic and sequela of prior inflammatory process or represent residual or recurrent atypical pneumonia. The previously seen 5 mm nodule in the posteromedial right lung base is not visualized on today's exam. No pleural effusion or pneumothorax. The central airways are patent. Upper Abdomen: No acute abnormality. Musculoskeletal: Osteopenia with degenerative changes. No acute osseous pathology. IMPRESSION: 1. Faint areas of ground-glass density in the right lower lobe and to a lesser degree in the right upper lobe may be chronic and sequela of prior inflammatory process or represent residual or recurrent atypical pneumonia. 2. Similar appearance of consolidative changes of the right middle lobe with air bronchogram, likely atelectasis. 3. Aortic Atherosclerosis (ICD10-I70.0) and Emphysema (ICD10-J43.9). Electronically Signed   By: Vanetta Chou M.D.   On: 03/22/2024 14:41   DG Chest 1 View Result Date: 03/22/2024 CLINICAL DATA:  Shortness of breath, sepsis. EXAM: CHEST  1 VIEW COMPARISON:  03/03/2024 and CT chest 02/28/2024. FINDINGS: Patient is slightly rotated. Trachea is midline. Heart is enlarged, stable. Pacemaker lead tips are in the right atrium and right ventricle. Minimal streaky atelectasis in the lung bases. No pleural fluid. IMPRESSION: Minimal bibasilar streaky atelectasis. Electronically Signed   By: Newell Eke M.D.   On: 03/22/2024 10:57       Elgie Butter M.D. [facility] 03/23/2024, 12:52 PM  Available via Epic secure chat 7am-7pm After 7 pm, please refer to night coverage provider listed on amion.

## 2024-03-23 NOTE — TOC Initial Note (Signed)
 Transition of Care [facility]) - Initial/Assessment Note    Patient Details  Name: Brittany Irwin MRN: [id number] Date of Birth: 05-11-1941  Transition of Care [facility] CM/SW Contact:    Brittany Erminio Deems, RN Phone Number: 03/23/2024, 1:34 PM  Clinical Narrative:  Patient presented for sepsis. PTA patient was from home alone. Patient states she has support of her sister that takes her to PCP appointments. Patient uses oxygen 4 liters. Patient is currently active with [facility] for RN/PT/SLP- will need orders and F2F once stable. Case Manager will continue to follow for additional disposition needs as she progresses.                Expected Discharge Plan: Home w Home Health Services Barriers to Discharge: Continued Medical Work up   Patient Goals and CMS Choice Patient states their goals for this hospitalization and ongoing recovery are:: plan will be to transition home.  Expected Discharge Plan and Services   Discharge Planning Services: CM Consult Post Acute Care Choice: Home Health, Resumption of Svcs/PTA Provider Living arrangements for the past 2 months: Apartment                   DME Agency: NA       HH Arranged: RN, Disease Management, Speech Therapy HH Agency: [facility] Date [facility] Agency Contacted: 03/23/24 Time HH Agency Contacted: 1144 Representative spoke with at [facility] Agency: Brittany Irwin  Prior Living Arrangements/Services Living arrangements for the past 2 months: Apartment Lives with:: Self Patient language and need for interpreter reviewed:: Yes Do you feel safe going back to the place where you live?: Yes      Need for Family Participation in Patient Care: No (Comment) Care giver support system in place?: No (comment) Current home services: DME, Home PT, Home RN (Oxygen 4 Liters) Criminal Activity/Legal Involvement Pertinent to Current Situation/Hospitalization: No - Comment as needed  Activities of Daily Living   ADL Screening (condition at time of  admission) Independently performs ADLs?: Yes (appropriate for developmental age) Is the patient deaf or have difficulty hearing?: No Does the patient have difficulty seeing, even when wearing glasses/contacts?: No Does the patient have difficulty concentrating, remembering, or making decisions?: No  Permission Sought/Granted Permission sought to share information with : Family Supports, Magazine features editor, Case Automotive engineer granted to share info w AGENCY: Brittany Irwin        Emotional Assessment Appearance:: Appears stated age Attitude/Demeanor/Rapport: Engaged Affect (typically observed): Appropriate Orientation: : Oriented to Self, Oriented to Place, Oriented to  Time Alcohol  / Substance Use: Not Applicable Psych Involvement: No (comment)  Admission diagnosis:  COPD exacerbation (HCC) [J44.1] Sepsis due to pneumonia (HCC) [J18.9, A41.9] Sepsis, due to unspecified organism, unspecified whether acute organ dysfunction present (HCC) [A41.9] Acute hypoxic respiratory failure (HCC) [J96.01] Patient Active Problem List   Diagnosis Date Noted   Acute on chronic respiratory failure with hypoxia (HCC) 03/02/2024   Multifocal pneumonia 03/02/2024   Elevated troponin 02/28/2024   Acute kidney injury superimposed on chronic kidney disease (HCC) 02/14/2024   Acute metabolic encephalopathy 02/14/2024   Abnormal urinalysis 02/14/2024   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) 02/14/2024   Neuropathy 02/14/2024   Hyperlipidemia 02/14/2024   GERD (gastroesophageal reflux disease) 02/14/2024   Sepsis due to pneumonia (HCC) 09/30/2023   COVID 09/06/2023   COVID-19 virus infection 09/05/2023   Sepsis without acute organ dysfunction (HCC) 09/05/2023   Acute on chronic hypoxic respiratory failure (HCC)  09/05/2023   Dehydration 09/05/2023   Hypotension due to hypovolemia 09/05/2023   SBO (small bowel obstruction) (HCC) 08/11/2023   Tachycardia-bradycardia  syndrome (HCC) 08/11/2023   Presence of permanent cardiac pacemaker 08/11/2023   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) 10/15/2022   Chest pain, rule out acute myocardial infarction 10/14/2022   Chronic respiratory failure with hypoxia (HCC) 03/12/2022   Essential hypertension 01/03/2022   Chronic kidney disease, stage 3a (HCC) 01/03/2022   Chronic anemia 01/03/2022   Crohn disease (HCC) 01/03/2022   Chronic back pain 01/03/2022   AKI (acute kidney injury) (HCC) 09/08/2020   Chronic diastolic (congestive) heart failure (HCC) 10/04/2013   COPD (chronic obstructive pulmonary disease) (HCC) 04/08/2012   Anxiety 03/19/2012   PCP:  Brittany Pierce, MD Pharmacy:   [facility] [place], [place] - [address] [place] [address] Phone: [phone] Fax: [phone]     Social Drivers of Health (SDOH) Social History: SDOH Screenings   Food Insecurity: No Food Insecurity (03/23/2024)  Housing: Low Risk  (03/23/2024)  Transportation Needs: No Transportation Needs (03/23/2024)  Utilities: Not At Risk (03/23/2024)  Financial Resource Strain: Low Risk  (10/14/2023)   Received from [facility]  Recent Concern: Financial Resource Strain - Medium Risk (07/27/2023)   Received from [facility]  Physical Activity: Insufficiently Active (07/27/2023)   Received from [facility]  Social Connections: Patient Declined (03/23/2024)  Stress: No Stress Concern Present (07/27/2023)   Received from [facility]  Tobacco Use: Medium Risk (03/22/2024)   SDOH Interventions:     Readmission Risk Interventions    10/02/2023    2:35 PM 09/07/2023    3:27 PM  Readmission Risk Prevention Plan  Transportation Screening Complete Complete  PCP or Specialist Appt within 3-5 Days  Complete  HRI or Home Care Consult Complete Complete  Social Work Consult for Recovery Care Planning/Counseling Complete Complete  Palliative Care Screening Not  Applicable Not Applicable  Medication Review Oceanographer) Complete Complete

## 2024-03-23 NOTE — Plan of Care (Signed)
  Problem: Education: Goal: Knowledge of General Education information will improve Description: Including pain rating scale, medication(s)/side effects and non-pharmacologic comfort measures Outcome: Progressing   Problem: Health Behavior/Discharge Planning: Goal: Ability to manage health-related needs will improve Outcome: Progressing   Problem: Clinical Measurements: Goal: Ability to maintain clinical measurements within normal limits will improve Outcome: Progressing Goal: Will remain free from infection Outcome: Progressing Goal: Diagnostic test results will improve Outcome: Progressing Goal: Respiratory complications will improve Outcome: Progressing Goal: Cardiovascular complication will be avoided Outcome: Progressing   Problem: Activity: Goal: Risk for activity intolerance will decrease Outcome: Progressing   Problem: Nutrition: Goal: Adequate nutrition will be maintained Outcome: Progressing   Problem: Coping: Goal: Level of anxiety will decrease Outcome: Progressing   Problem: Elimination: Goal: Will not experience complications related to bowel motility Outcome: Progressing Goal: Will not experience complications related to urinary retention Outcome: Progressing   Problem: Safety: Goal: Ability to remain free from injury will improve Outcome: Progressing   Problem: Skin Integrity: Goal: Risk for impaired skin integrity will decrease Outcome: Progressing   Problem: Clinical Measurements: Goal: Ability to maintain a body temperature in the normal range will improve Outcome: Progressing   Problem: Respiratory: Goal: Ability to maintain adequate ventilation will improve Outcome: Progressing Goal: Ability to maintain a clear airway will improve Outcome: Progressing

## 2024-03-24 DIAGNOSIS — I10 Essential (primary) hypertension: Secondary | ICD-10-CM | POA: Diagnosis not present

## 2024-03-24 DIAGNOSIS — J9601 Acute respiratory failure with hypoxia: Secondary | ICD-10-CM | POA: Diagnosis not present

## 2024-03-24 DIAGNOSIS — J189 Pneumonia, unspecified organism: Secondary | ICD-10-CM | POA: Diagnosis not present

## 2024-03-24 DIAGNOSIS — A419 Sepsis, unspecified organism: Secondary | ICD-10-CM | POA: Diagnosis not present

## 2024-03-24 LAB — BASIC METABOLIC PANEL WITH GFR
Anion gap: 8 (ref 5–15)
BUN: 25 mg/dL — ABNORMAL HIGH (ref 8–23)
CO2: 27 mmol/L (ref 22–32)
Calcium: 9.3 mg/dL (ref 8.9–10.3)
Chloride: 105 mmol/L (ref 98–111)
Creatinine, Ser: 1.03 mg/dL — ABNORMAL HIGH (ref 0.44–1.00)
GFR, Estimated: 55 mL/min — ABNORMAL LOW (ref >60)
Glucose, Bld: 109 mg/dL — ABNORMAL HIGH (ref 70–99)
Potassium: 4.4 mmol/L (ref 3.5–5.1)
Sodium: 140 mmol/L (ref 135–145)

## 2024-03-24 LAB — CBC WITH DIFFERENTIAL/PLATELET
Abs Immature Granulocytes: 0.01 K/uL (ref 0.00–0.07)
Basophils Absolute: 0.1 K/uL (ref 0.0–0.1)
Basophils Relative: 1 %
Eosinophils Absolute: 0.2 K/uL (ref 0.0–0.5)
Eosinophils Relative: 3 %
HCT: 35 % — ABNORMAL LOW (ref 36.0–46.0)
Hemoglobin: 11.1 g/dL — ABNORMAL LOW (ref 12.0–15.0)
Immature Granulocytes: 0 %
Lymphocytes Relative: 15 %
Lymphs Abs: 0.8 K/uL (ref 0.7–4.0)
MCH: 28.9 pg (ref 26.0–34.0)
MCHC: 31.7 g/dL (ref 30.0–36.0)
MCV: 91.1 fL (ref 80.0–100.0)
Monocytes Absolute: 0.4 K/uL (ref 0.1–1.0)
Monocytes Relative: 8 %
Neutro Abs: 3.8 K/uL (ref 1.7–7.7)
Neutrophils Relative %: 73 %
Platelets: 159 K/uL (ref 150–400)
RBC: 3.84 MIL/uL — ABNORMAL LOW (ref 3.87–5.11)
RDW: 14 % (ref 11.5–15.5)
WBC: 5.2 K/uL (ref 4.0–10.5)
nRBC: 0 % (ref 0.0–0.2)

## 2024-03-24 NOTE — Evaluation (Signed)
 Physical Therapy Evaluation Patient Details Name: Brittany Irwin MRN: [id number] DOB: [birth year] Today's Date: 03/24/2024  History of Present Illness  83 y.o. female presents to [facility] hospital on 03/22/2024 with lethargy, fever and hypoxia, found to have PNA. PMH:  PNA 3/25 and 8/11, Crohns, HTN, CHF, CKD, chronic back pain, anemia, COPD, chronic respiratory failure with hypoxia, SBO, psoriasis, migraines, and anxiety.  Clinical Impression  Pt presents to PT with deficits in activity tolerance, gait, balance. Pt reports back pain which is currently limiting mobility tolerance. Pt utilizes support of IV pole when mobilizing at this time, was able to ambulate without DME earlier in the day with mobility specialist. Pt has access to needed DME at home. PT encourages frequent mobilization during admission.        If plan is discharge home, recommend the following: Assistance with cooking/housework   Can travel by private vehicle        Equipment Recommendations None recommended by PT  Recommendations for Other Services       Functional Status Assessment Patient has had a recent decline in their functional status and demonstrates the ability to make significant improvements in function in a reasonable and predictable amount of time.     Precautions / Restrictions Precautions Precautions: Fall Recall of Precautions/Restrictions: Intact Precaution/Restrictions Comments: 4L O2 at baseline Restrictions Weight Bearing Restrictions Per Provider Order: No      Mobility  Bed Mobility Overal bed mobility: Modified Independent             General bed mobility comments: increased time    Transfers Overall transfer level: Needs assistance Equipment used: None Transfers: Sit to/from Stand Sit to Stand: Supervision                Ambulation/Gait Ambulation/Gait assistance: Supervision Gait Distance (Feet): 350 Feet Assistive device: IV Pole Gait Pattern/deviations:  Step-through pattern Gait velocity: reduced Gait velocity interpretation: <1.8 ft/sec, indicate of risk for recurrent falls   General Gait Details: slowed step-through gait, UE support of IV pole  Stairs            Wheelchair Mobility     Tilt Bed    Modified Rankin (Stroke Patients Only)       Balance Overall balance assessment: Needs assistance Sitting-balance support: No upper extremity supported, Feet supported Sitting balance-Leahy Scale: Good     Standing balance support: Single extremity supported, Reliant on assistive device for balance Standing balance-Leahy Scale: Poor                               Pertinent Vitals/Pain Pain Assessment Pain Assessment: Faces Faces Pain Scale: Hurts even more Pain Location: low back Pain Descriptors / Indicators: Aching Pain Intervention(s): Monitored during session    Home Living Family/patient expects to be discharged to:: Private residence Living Arrangements: Alone Available Help at Discharge: Family;Available PRN/intermittently Type of Home: Apartment Home Access: Level entry       Home Layout: One level Home Equipment: Agricultural consultant (2 wheels);Rollator (4 wheels);Grab bars - tub/shower;Hand held shower head      Prior Function Prior Level of Function : Independent/Modified Independent;Driving             Mobility Comments: 4L O2 baseline, needs rollator at times ADLs Comments: sister provides transportation, ind with ADLs, cooks     Extremity/Trunk Assessment   Upper Extremity Assessment Upper Extremity Assessment: Overall WFL for tasks assessed    Lower Extremity  Assessment Lower Extremity Assessment: Generalized weakness    Cervical / Trunk Assessment Cervical / Trunk Assessment: Normal  Communication   Communication Communication: No apparent difficulties    Cognition Arousal: Alert Behavior During Therapy: WFL for tasks assessed/performed   PT - Cognitive  impairments: No apparent impairments                         Following commands: Intact       Cueing Cueing Techniques: Verbal cues     General Comments General comments (skin integrity, edema, etc.): pt on 4L [place] at rest, ambulates with stable SpO2 and able to converse without reports of significant DOE    Exercises     Assessment/Plan    PT Assessment Patient needs continued PT services  PT Problem List Decreased activity tolerance;Cardiopulmonary status limiting activity       PT Treatment Interventions Gait training;Therapeutic exercise;Patient/family education    PT Goals (Current goals can be found in the Care Plan section)  Acute Rehab PT Goals Patient Stated Goal: to return to independence PT Goal Formulation: With patient Time For Goal Achievement: 04/07/24 Potential to Achieve Goals: Good Additional Goals Additional Goal #1: Pt will report 1/4 DOE or less when ambulating for >1000' to demonstrate improved tolerance for community mobility    Frequency Min 2X/week     Co-evaluation               AM-PAC PT 6 Clicks Mobility  Outcome Measure Help needed turning from your back to your side while in a flat bed without using bedrails?: None Help needed moving from lying on your back to sitting on the side of a flat bed without using bedrails?: None Help needed moving to and from a bed to a chair (including a wheelchair)?: A Little Help needed standing up from a chair using your arms (e.g., wheelchair or bedside chair)?: A Little Help needed to walk in hospital room?: A Little Help needed climbing 3-5 steps with a railing? : A Little 6 Click Score: 20    End of Session Equipment Utilized During Treatment: Oxygen Activity Tolerance: Patient tolerated treatment well Patient left: in bed;with call bell/phone within reach Nurse Communication: Mobility status PT Visit Diagnosis: Other abnormalities of gait and mobility (R26.89)    Time:  8379-8359 PT Time Calculation (min) (ACUTE ONLY): 20 min   Charges:   PT Evaluation $PT Eval Low Complexity: 1 Low   PT General Charges $$ ACUTE PT VISIT: 1 Visit         Bernardino JINNY Ruth, PT, DPT Acute Rehabilitation Office [phone]   Bernardino JINNY Ruth 03/24/2024, 4:47 PM

## 2024-03-24 NOTE — Progress Notes (Signed)
 [facility]                                                                               Brittany Irwin, is a 83 y.o. female, DOB - 1940-09-23, FMW:[id number] Admit date - 03/22/2024    Outpatient Primary MD for the patient is Brittany Pierce, MD  LOS - 2  days    Brief summary   83 y.o. female with past medical history  of  COPD, chronic respiratory failure on 4 L, HfpEF, CAD, tachybradycardia syndrome s/p permanent pacemaker, Crohn's disease, SBO, psoriasis, migraine headaches, CKD stage III, and anxiety, presenting again for lethargy fever and hypoxia that started early this morning.  Patient was admitted in July for UTI, she was admitted on 11 August for shortness of breath with sepsis secondary to pneumonia.   CT chest shows Faint areas of ground-glass density in the right lower lobe and to a lesser degree in the right upper lobe may be chronic and sequela of prior inflammatory process or represent residual or recurrent atypical pneumonia.   Assessment & Plan    Assessment and Plan:  SIRS/ Persistent/ recurrent pneumonia from aspiration.  Started the patient on IV antibiotics. Recommend another day of IV cefepime  and transition to oral augmentin on discharge.  Check ambulating oxygen levels on discharge .  UA is negative for infection.  Blood cultures are negative so far.  Wbc normalized.  Procalcitonin is 0.18. lactic acid is wnl.  MRSA pcr is negative.  Acute Kidney Injury  Creatinine is 2.1 on admission, improved to 1.3 today to 1 .03 wit IV fluids.  BNP is 402.  Hold IV fluids and monitor.    Chronic respiratory failure with hypoxia at baseline on 4 lit of [place] oxygen.  Currently she is on 4 lit.  Resume home bronchodilators with Brextri and levalbuterol  nebs as needed.     Elevated troponins from demand ischemia from SIRS/ pneumonia.  Pt denies any chest pain.  EKG does not show any ischemic changes.  Echocardiogram done 2 weeks ago  shows LVEF  is 60 to 65%, no regional wall abn, diastolic parameters are indeterminate. Right ventricular systolic function wnl.  Recommend outpatient follow up with cardiology.     Diarrhea:  Appears to have resolved.   C diff pcr and GI panel is negative.    H/o Crohns' disease Stable.    Stage 3a CKD Creatinine back to baseline today.    H/o tachy brachysyndrome with PPM placement.  On atenolol  for rate control and on eliquis  for anti coagulation.     Lower back pain from spinal stenosis Pain control with low dose oxycodone  and lyrica .    Anxiety [name] on xanax  , continue the same.    Hyperlipidemia Resume zetia  and fenofibrate .   Estimated body mass index is 27.97 kg/m as calculated from the following:   Height as of this encounter: 5' 4 (1.626 m).   Weight as of this encounter: 73.9 kg.  Code Status: full code.  DVT Prophylaxis:   apixaban  (ELIQUIS ) tablet 5 mg   Level of Care: Level of care: Progressive Family Communication: none at bedside.   Disposition Plan:  Remains inpatient appropriate:  pending clinical improvement.   Procedures:  None.   Consultants:   None.   Antimicrobials:   Anti-infectives (From admission, onward)    Start     Dose/Rate Route Frequency Ordered Stop   03/23/24 1000  ceFEPIme  (MAXIPIME ) 2 g in sodium chloride  0.9 % 100 mL IVPB  Status:  Discontinued        2 g 200 mL/hr over 30 Minutes Intravenous Every 24 hours 03/22/24 1506 03/22/24 1826   03/23/24 1000  ceFEPIme  (MAXIPIME ) 2 g in sodium chloride  0.9 % 100 mL IVPB        2 g 200 mL/hr over 30 Minutes Intravenous Every 24 hours 03/22/24 1919     03/22/24 1506  vancomycin  variable dose per unstable renal function (pharmacist dosing)  Status:  Discontinued         Does not apply See admin instructions 03/22/24 1506 03/22/24 1826   03/22/24 1315  vancomycin  (VANCOREADY) IVPB 500 mg/100 mL        500 mg 100 mL/hr over 60 Minutes Intravenous NOW 03/22/24 1303 03/22/24 1518    03/22/24 0930  ceFEPIme  (MAXIPIME ) 2 g in sodium chloride  0.9 % 100 mL IVPB        2 g 200 mL/hr over 30 Minutes Intravenous  Once 03/22/24 0924 03/22/24 1048   03/22/24 0930  vancomycin  (VANCOCIN ) IVPB 1000 mg/200 mL premix        1,000 mg 200 mL/hr over 60 Minutes Intravenous  Once 03/22/24 9075 03/22/24 1153        Medications  Scheduled Meds:  acidophilus  1 capsule Oral TID   ALPRAZolam   1 mg Oral BID   apixaban   5 mg Oral BID   atenolol   25 mg Oral Daily   budesonide -glycopyrrolate -formoterol   2 puff Inhalation BID   diclofenac  Sodium  2 g Topical QID   ezetimibe   10 mg Oral QHS   fenofibrate   54 mg Oral Daily   pantoprazole   40 mg Oral Daily   pregabalin   50 mg Oral BID   spironolactone   12.5 mg Oral Daily   Vitamin D  (Ergocalciferol )  50,000 Units Oral Q7 days   Continuous Infusions:  ceFEPime  (MAXIPIME ) IV 2 g (03/24/24 1011)   PRN Meds:.cyclobenzaprine , levalbuterol , loratadine , nitroGLYCERIN , oxyCODONE     Subjective:   Brittany Irwin was seen and examined today.   Slowly improving. Breathing has improved.  Worried about her pneumonia.   Objective:   Vitals:   03/24/24 0423 03/24/24 0755 03/24/24 0808 03/24/24 1154  BP: 123/69 (!) 120/52  (!) 122/55  Pulse: 82 (!) 58  62  Resp: 19 16  15   Temp: 98 F (36.7 C) 98.5 F (36.9 C)  98.1 F (36.7 C)  TempSrc: Oral Oral  Oral  SpO2: 98% 99% 98% 96%  Weight:      Height:       No intake or output data in the 24 hours ending 03/24/24 1519  Filed Weights   03/22/24 0933 03/22/24 1757  Weight: 72.8 kg 73.9 kg     Exam General exam: Appears calm and comfortable  Respiratory system: Clear to auscultation. Respiratory effort normal. Cardiovascular system: S1 & S2 heard, RRR. No JVD, Gastrointestinal system: Abdomen is nondistended, soft and nontender. Central nervous system: Alert and oriented.  Extremities: Symmetric 5 x 5 power. Skin: No rashes,  Psychiatry:  Mood & affect appropriate.       Data Reviewed:  I have personally reviewed following labs and imaging studies  CBC Lab Results  Component Value Date   WBC 5.2 03/24/2024   RBC 3.84 (L) 03/24/2024   HGB 11.1 (L) 03/24/2024   HCT 35.0 (L) 03/24/2024   MCV 91.1 03/24/2024   MCH 28.9 03/24/2024   PLT 159 03/24/2024   MCHC 31.7 03/24/2024   RDW 14.0 03/24/2024   LYMPHSABS 0.8 03/24/2024   MONOABS 0.4 03/24/2024   EOSABS 0.2 03/24/2024   BASOSABS 0.1 03/24/2024     Last metabolic panel Lab Results  Component Value Date   NA 140 03/24/2024   K 4.4 03/24/2024   CL 105 03/24/2024   CO2 27 03/24/2024   BUN 25 (H) 03/24/2024   CREATININE 1.03 (H) 03/24/2024   GLUCOSE 109 (H) 03/24/2024   GFRNONAA 55 (L) 03/24/2024   CALCIUM 9.3 03/24/2024   PHOS 3.3 02/28/2024   PROT 5.9 (L) 03/23/2024   ALBUMIN 3.1 (L) 03/23/2024   BILITOT 0.9 03/23/2024   ALKPHOS 29 (L) 03/23/2024   AST 26 03/23/2024   ALT 10 03/23/2024   ANIONGAP 8 03/24/2024    CBG (last 3)  No results for input(s): GLUCAP in the last 72 hours.    Coagulation Profile: No results for input(s): INR, PROTIME in the last 168 hours.   Radiology Studies: No results found.      Elgie Butter M.D. [facility] 03/24/2024, 3:19 PM  Available via Epic secure chat 7am-7pm After 7 pm, please refer to night coverage provider listed on amion.

## 2024-03-24 NOTE — Progress Notes (Signed)
 Mobility Specialist Progress Note;   03/24/24 1200  Mobility  Activity Ambulated with assistance  Level of Assistance Contact guard assist, steadying assist  Assistive Device None  Distance Ambulated (ft) 400 ft  Activity Response Tolerated well  Mobility Referral Yes  Mobility visit 1 Mobility  Mobility Specialist Start Time (ACUTE ONLY) 1200  Mobility Specialist Stop Time (ACUTE ONLY) 1225  Mobility Specialist Time Calculation (min) (ACUTE ONLY) 25 min   Pt agreeable to mobility. On 3LO2 upon arrival. Required light MinG assistance during ambulation for safety. Pt requested to ambulate on 4LO2 as this is what she wears at baseline. C/o lower L back pain throughout, provided hot packs for comfort. Pt returned back to bed and left with all needs met, call bell in reach.   Lauraine Erm Mobility Specialist Please contact via SecureChat or [employer] [phone]

## 2024-03-24 NOTE — Plan of Care (Signed)

## 2024-03-24 NOTE — Plan of Care (Signed)
  Problem: Health Behavior/Discharge Planning: Goal: Ability to manage health-related needs will improve Outcome: Progressing   Problem: Clinical Measurements: Goal: Respiratory complications will improve Outcome: Progressing Goal: Cardiovascular complication will be avoided Outcome: Progressing   Problem: Activity: Goal: Risk for activity intolerance will decrease Outcome: Progressing   Problem: Nutrition: Goal: Adequate nutrition will be maintained Outcome: Progressing   Problem: Coping: Goal: Level of anxiety will decrease Outcome: Progressing   Problem: Pain Managment: Goal: General experience of comfort will improve and/or be controlled Outcome: Progressing   Problem: Safety: Goal: Ability to remain free from injury will improve Outcome: Progressing   Problem: Respiratory: Goal: Ability to maintain adequate ventilation will improve Outcome: Progressing Goal: Ability to maintain a clear airway will improve Outcome: Progressing

## 2024-03-25 ENCOUNTER — Inpatient Hospital Stay (HOSPITAL_COMMUNITY)

## 2024-03-25 DIAGNOSIS — J9601 Acute respiratory failure with hypoxia: Secondary | ICD-10-CM | POA: Diagnosis not present

## 2024-03-25 DIAGNOSIS — J189 Pneumonia, unspecified organism: Secondary | ICD-10-CM | POA: Diagnosis not present

## 2024-03-25 DIAGNOSIS — I10 Essential (primary) hypertension: Secondary | ICD-10-CM | POA: Diagnosis not present

## 2024-03-25 DIAGNOSIS — A419 Sepsis, unspecified organism: Secondary | ICD-10-CM | POA: Diagnosis not present

## 2024-03-25 MED ORDER — FUROSEMIDE 40 MG PO TABS
40.0000 mg | ORAL_TABLET | Freq: Once | ORAL | Status: AC
  Administered 2024-03-25: 40 mg via ORAL
  Filled 2024-03-25: qty 1

## 2024-03-25 MED ORDER — DOXYCYCLINE HYCLATE 100 MG PO TABS
100.0000 mg | ORAL_TABLET | Freq: Two times a day (BID) | ORAL | Status: DC
  Administered 2024-03-26: 100 mg via ORAL
  Filled 2024-03-25: qty 1

## 2024-03-25 MED ORDER — OXYCODONE HCL 5 MG PO TABS: 5.0000 mg | ORAL_TABLET | Freq: Once | ORAL | Status: AC

## 2024-03-25 NOTE — Plan of Care (Signed)

## 2024-03-25 NOTE — Progress Notes (Signed)
 [facility]                                                                               Brittany Irwin, is a 83 y.o. female, DOB - [birth year], FMW:[id number] Admit date - 03/22/2024    Outpatient Primary MD for the patient is [name], MD  LOS - 3  days    Brief summary   83 y.o. female with past medical history  of  COPD, chronic respiratory failure on 4 L, HfpEF, CAD, tachybradycardia syndrome s/p permanent pacemaker, Crohn's disease, SBO, psoriasis, migraine headaches, CKD stage III, and anxiety, presenting again for lethargy fever and hypoxia that started early this morning.  Patient was admitted in July for UTI, she was admitted on 11 August for shortness of breath with sepsis secondary to pneumonia.   CT chest shows Faint areas of ground-glass density in the right lower lobe and to a lesser degree in the right upper lobe may be chronic and sequela of prior inflammatory process or represent residual or recurrent atypical pneumonia.   Assessment & Plan    Assessment and Plan:  SIRS/ Persistent/ recurrent pneumonia from aspiration.  Started the patient on IV antibiotics. Recommend another day of IV cefepime  and transition to oral augmentin on discharge.  She remains on 4lit of [place] oxygen on ambulation.  UA is negative for infection.  Blood cultures are negative so far.  Wbc normalized.  Procalcitonin is 0.18. lactic acid is wnl.  MRSA pcr is negative.  Acute Kidney Injury on stage 3a CKD.  Creatinine is 2.1 on admission, improved to 1.3 today to 1 .03 wit IV fluids.  BNP is 402.  Hold IV fluids and monitor.  Repeat renal parameters in am.    Chronic respiratory failure with hypoxia at baseline on 4 lit of [place] oxygen.  Currently she is on 4 lit.  Resume home bronchodilators with Brextri and levalbuterol  nebs as needed.     Elevated troponins from demand ischemia from SIRS/ pneumonia.  Pt denies any chest pain.  EKG does not show any ischemic  changes.  Echocardiogram done 2 weeks ago  shows LVEF is 60 to 65%, no regional wall abn, diastolic parameters are indeterminate. Right ventricular systolic function wnl.  Recommend outpatient follow up with cardiology.     Diarrhea:  Appears to have resolved.   C diff pcr and GI panel is negative.    H/o Crohns' disease Stable.    H/o tachy brachysyndrome with PPM placement.  On atenolol  for rate control and on eliquis  for anti coagulation.     Lower back pain from spinal stenosis Pain control with low dose oxycodone  and lyrica .  Worsening today, will get CT lumbar spine with out contrast for further eval.    Anxiety [name] on xanax  , continue the same.    Hyperlipidemia Resume zetia  and fenofibrate .   Estimated body mass index is 27.97 kg/m as calculated from the following:   Height as of this encounter: 5' 4 (1.626 m).   Weight as of this encounter: 73.9 kg.  Code Status: full code.  DVT Prophylaxis:   apixaban  (ELIQUIS ) tablet 5 mg   Level of Care: Level  of care: Progressive Family Communication: none at bedside.   Disposition Plan:     Remains inpatient appropriate:  pending clinical improvement.   Procedures:  CT lumbar spine.   Consultants:   None.   Antimicrobials:   Anti-infectives (From admission, onward)    Start     Dose/Rate Route Frequency Ordered Stop   03/23/24 1000  ceFEPIme  (MAXIPIME ) 2 g in sodium chloride  0.9 % 100 mL IVPB  Status:  Discontinued        2 g 200 mL/hr over 30 Minutes Intravenous Every 24 hours 03/22/24 1506 03/22/24 1826   03/23/24 1000  ceFEPIme  (MAXIPIME ) 2 g in sodium chloride  0.9 % 100 mL IVPB        2 g 200 mL/hr over 30 Minutes Intravenous Every 24 hours 03/22/24 1919     03/22/24 1506  vancomycin  variable dose per unstable renal function (pharmacist dosing)  Status:  Discontinued         Does not apply See admin instructions 03/22/24 1506 03/22/24 1826   03/22/24 1315  vancomycin  (VANCOREADY) IVPB 500 mg/100  mL        500 mg 100 mL/hr over 60 Minutes Intravenous NOW 03/22/24 1303 03/22/24 1518   03/22/24 0930  ceFEPIme  (MAXIPIME ) 2 g in sodium chloride  0.9 % 100 mL IVPB        2 g 200 mL/hr over 30 Minutes Intravenous  Once 03/22/24 0924 03/22/24 1048   03/22/24 0930  vancomycin  (VANCOCIN ) IVPB 1000 mg/200 mL premix        1,000 mg 200 mL/hr over 60 Minutes Intravenous  Once 03/22/24 9075 03/22/24 1153        Medications  Scheduled Meds:  acidophilus  1 capsule Oral TID   ALPRAZolam   1 mg Oral BID   apixaban   5 mg Oral BID   atenolol   25 mg Oral Daily   budesonide -glycopyrrolate -formoterol   2 puff Inhalation BID   diclofenac  Sodium  2 g Topical QID   ezetimibe   10 mg Oral QHS   fenofibrate   54 mg Oral Daily   oxyCODONE   5 mg Oral Once   pantoprazole   40 mg Oral Daily   pregabalin   50 mg Oral BID   spironolactone   12.5 mg Oral Daily   Vitamin D  (Ergocalciferol )  50,000 Units Oral Q7 days   Continuous Infusions:  ceFEPime  (MAXIPIME ) IV 2 g (03/25/24 1040)   PRN Meds:.cyclobenzaprine , levalbuterol , loratadine , nitroGLYCERIN , oxyCODONE     Subjective:   Brittany Irwin was seen and examined today. Worsening back pain. 2 BM this am.   Objective:   Vitals:   03/25/24 0012 03/25/24 0411 03/25/24 0846 03/25/24 0857  BP: 120/79 (!) 147/65  134/68  Pulse: 67     Resp:  14  16  Temp: 98.6 F (37 C) 97.6 F (36.4 C)  (!) 96.4 F (35.8 C)  TempSrc: Oral Oral  Axillary  SpO2:   98%   Weight:      Height:        Intake/Output Summary (Last 24 hours) at 03/25/2024 1052 Last data filed at 03/24/2024 1523 Gross per 24 hour  Intake 200 ml  Output --  Net 200 ml    Filed Weights   03/22/24 0933 03/22/24 1757  Weight: 72.8 kg 73.9 kg     Exam General exam:Ill appearing elderly lady in distress from pain.  Respiratory system: Clear to auscultation. Respiratory effort normal. Cardiovascular system: S1 & S2 heard, RRR.  Gastrointestinal system: Abdomen is nondistended,  soft and  nontender.  Central nervous system: Alert and oriented.  Extremities: no edema.  Skin: No rashes, Psychiatry: anxious.       Data Reviewed:  I have personally reviewed following labs and imaging studies   CBC Lab Results  Component Value Date   WBC 5.2 03/24/2024   RBC 3.84 (L) 03/24/2024   HGB 11.1 (L) 03/24/2024   HCT 35.0 (L) 03/24/2024   MCV 91.1 03/24/2024   MCH 28.9 03/24/2024   PLT 159 03/24/2024   MCHC 31.7 03/24/2024   RDW 14.0 03/24/2024   LYMPHSABS 0.8 03/24/2024   MONOABS 0.4 03/24/2024   EOSABS 0.2 03/24/2024   BASOSABS 0.1 03/24/2024     Last metabolic panel Lab Results  Component Value Date   NA 140 03/24/2024   K 4.4 03/24/2024   CL 105 03/24/2024   CO2 27 03/24/2024   BUN 25 (H) 03/24/2024   CREATININE 1.03 (H) 03/24/2024   GLUCOSE 109 (H) 03/24/2024   GFRNONAA 55 (L) 03/24/2024   CALCIUM 9.3 03/24/2024   PHOS 3.3 02/28/2024   PROT 5.9 (L) 03/23/2024   ALBUMIN 3.1 (L) 03/23/2024   BILITOT 0.9 03/23/2024   ALKPHOS 29 (L) 03/23/2024   AST 26 03/23/2024   ALT 10 03/23/2024   ANIONGAP 8 03/24/2024    CBG (last 3)  No results for input(s): GLUCAP in the last 72 hours.    Coagulation Profile: No results for input(s): INR, PROTIME in the last 168 hours.   Radiology Studies: No results found.      Elgie Butter M.D. [facility] 03/25/2024, 10:52 AM  Available via Epic secure chat 7am-7pm After 7 pm, please refer to night coverage provider listed on amion.

## 2024-03-25 NOTE — Plan of Care (Signed)

## 2024-03-26 DIAGNOSIS — A419 Sepsis, unspecified organism: Secondary | ICD-10-CM | POA: Diagnosis not present

## 2024-03-26 DIAGNOSIS — J189 Pneumonia, unspecified organism: Secondary | ICD-10-CM | POA: Diagnosis not present

## 2024-03-26 DIAGNOSIS — J9611 Chronic respiratory failure with hypoxia: Secondary | ICD-10-CM

## 2024-03-26 DIAGNOSIS — J9601 Acute respiratory failure with hypoxia: Secondary | ICD-10-CM | POA: Diagnosis not present

## 2024-03-26 MED ORDER — OXYCODONE HCL 5 MG PO TABS: 5.0000 mg | ORAL_TABLET | Freq: Four times a day (QID) | ORAL | 0 refills | Status: DC | PRN

## 2024-03-26 MED ORDER — RISAQUAD PO CAPS: 1.0000 | ORAL_CAPSULE | Freq: Three times a day (TID) | ORAL | 0 refills | Status: DC

## 2024-03-26 MED ORDER — DOXYCYCLINE HYCLATE 100 MG PO TABS: 100.0000 mg | ORAL_TABLET | Freq: Two times a day (BID) | ORAL | 0 refills | Status: AC

## 2024-03-26 NOTE — Plan of Care (Signed)

## 2024-03-26 NOTE — Progress Notes (Signed)
 Mobility Specialist Progress Note:    03/26/24 1034  Mobility  Activity Ambulated independently  Level of Assistance Independent  Assistive Device None  Distance Ambulated (ft) 470 ft  Activity Response Tolerated well  Mobility Referral Yes  Mobility visit 1 Mobility  Mobility Specialist Start Time (ACUTE ONLY) 1034  Mobility Specialist Stop Time (ACUTE ONLY) 1044  Mobility Specialist Time Calculation (min) (ACUTE ONLY) 10 min   Pt received in bed, agreeable to mobility session. Ambulated independently in hallway, no AD required. SpO2 91% on RA throughout session. Returned pt to room, eager for d/c. All needs met.   Yashvi Jasinski Mobility Specialist Please contact via Special educational needs teacher or  Rehab office at [phone]

## 2024-03-27 LAB — CULTURE, BLOOD (ROUTINE X 2)
Culture: NO GROWTH
Culture: NO GROWTH
Special Requests: ADEQUATE

## 2024-03-28 NOTE — Discharge Summary (Signed)
 Physician Discharge Summary   Patient: Brittany Irwin MRN: [id number] DOB: 06-Nov-1940  Admit date:     03/22/2024  Discharge date: 03/26/2024  Discharge Physician: Elgie Butter   PCP: [name], MD   Recommendations at discharge:  Please follow up with PCP in one week.  Recommend follow up with repeat cbc and BMP in 1 to 2 weeks.  Recommend outpatient follow up with c  Discharge Diagnoses: Principal Problem:   Sepsis due to pneumonia (HCC) Chronic respiratory failure with hypoxia.     Hospital Course: 83 y.o. female with past medical history  of  COPD, chronic respiratory failure on 4 L, HfpEF, CAD, tachybradycardia syndrome s/p permanent pacemaker, Crohn's disease, SBO, psoriasis, migraine headaches, CKD stage III, and anxiety, presenting again for lethargy, fever, and hypoxia that started early this morning.  Patient was admitted in July for UTI, she was admitted on 11 August for shortness of breath with sepsis secondary to pneumonia.    CT chest shows Faint areas of ground-glass density in the right lower lobe and to a lesser degree in the right upper lobe may be chronic and sequela of prior inflammatory process or represent residual or recurrent atypical pneumonia.  She was started on broad spectrum IV antibiotics and narrowed to doxycycline  on discharge.  Assessment and Plan:   SIRS/ Persistent/ recurrent pneumonia from aspiration.  Fever, hypoxia requiring 6lit, leukocytosis and lethargy on admission.  Started the patient on IV antibiotics. Transitioned to oral antibiotics on discharge.  On discharge,  She is on 4lit of [place] oxygen on ambulation.  UA is negative for infection.  Blood cultures are negative so far.  Wbc normalized.  Procalcitonin is 0.18. lactic acid is wnl.  MRSA pcr is negative.   Acute Kidney Injury on stage 3a CKD.  Creatinine is 2.1 on admission, improved to 1.3 today to 1 .03 wit IV fluids.  BNP is 402.  Creatinine has been stable.        Chronic respiratory failure with hypoxia at baseline on 4 lit of  oxygen.  Currently she is on 4 lit.  Resume home bronchodilators with Brextri and levalbuterol  nebs as needed.        Elevated troponins from demand ischemia from SIRS/ pneumonia and AKI: Pt denies any chest pain.  EKG does not show any ischemic changes.  Echocardiogram done 2 weeks ago  shows LVEF is 60 to 65%, no regional wall abn, diastolic parameters are indeterminate. Right ventricular systolic function wnl.  Recommend outpatient follow up with cardiology.       Chronic diastolic heart failure.  She is euvolemic.     Diarrhea:  Appears to have resolved.   C diff pcr and GI panel is negative.      H/o Crohns' disease Stable.      H/o tachy brachysyndrome with PPM placement.  On atenolol  for rate control and on eliquis  for anti coagulation.        Lower back pain from spinal stenosis Pain control with low dose oxycodone  and lyrica .  Repeat CT of the lumbar spine did not show acute pathology.  Unfortunately patient is not a candidate for surgical intervention.  She is requesting to be discharged on oral oxycodone  for pain control.    Anxiety [name] on xanax  , continue the same.      Hyperlipidemia Resume zetia  and fenofibrate .    Estimated body mass index is 27.97 kg/m as calculated from the following:   Height as of this encounter: 5' 4 (  1.626 m).   Weight as of this encounter: 73.9 kg.     Consultants: none.  Procedures performed: none.   Disposition: Home Diet recommendation:  Discharge Diet Orders (From admission, onward)     Start     Ordered   03/26/24 0000  Diet - low sodium heart healthy        03/26/24 0949           Regular diet DISCHARGE MEDICATION: Allergies as of 03/26/2024       Reactions   Methylprednisolone  Other (See Comments)   Increased BP, HR, agitation, hallucinations    Shellfish Allergy Nausea And Vomiting   All SEAFOOD   Ativan  [lorazepam ] Other  (See Comments)   Psychosis   Azulfidine [sulfasalazine] Other (See Comments)   Headache    Epipen [epinephrine] Hypertension   Flagyl [metronidazole] Hives   Motrin [ibuprofen] Nausea Only, Other (See Comments)   Told not to take medication due to GI distress caused by crohn's disease.   Nsaids Other (See Comments)   Told not to take NSAIDs due to GI distress caused by crohn's disease   Penicillins Hives   12/2021, 11/2023 tolerated cephalosporins    Morphine And Codeine  Itching   Told not to take due to GI distress caused by crohn's disease        Medication List     STOP taking these medications    acetaminophen -codeine  300-60 MG tablet Commonly known as: TYLENOL  #4   fluconazole  150 MG tablet Commonly known as: DIFLUCAN        TAKE these medications    acidophilus Caps capsule Take 1 capsule by mouth 3 (three) times daily.   ALPRAZolam  1 MG tablet Commonly known as: XANAX  Take 1 mg by mouth 2 (two) times daily.   apixaban  5 MG Tabs tablet Commonly known as: ELIQUIS  Take 5 mg by mouth 2 (two) times daily.   atenolol  25 MG tablet Commonly known as: Tenormin  Take 1 tablet (25 mg total) by mouth daily.   cyanocobalamin 1000 MCG/ML injection Commonly known as: VITAMIN B12 Inject 1,000 mcg into the muscle every 30 (thirty) days.   cyclobenzaprine  5 MG tablet Commonly known as: FLEXERIL  Take 5 mg by mouth daily as needed for muscle spasms.   doxycycline  100 MG tablet Commonly known as: VIBRA -TABS Take 1 tablet (100 mg total) by mouth every 12 (twelve) hours for 2 days.   ergocalciferol  1.25 MG (50000 UT) capsule Commonly known as: VITAMIN D2 Take 50,000 Units by mouth once a week. Every thursday   ezetimibe  10 MG tablet Commonly known as: ZETIA  Take 10 mg by mouth at bedtime.   fenofibrate  micronized 200 MG capsule Commonly known as: LOFIBRA Take 200 mg by mouth daily.   furosemide  40 MG tablet Commonly known as: Lasix  Take 1 tablet (40 mg total)  by mouth daily.   ICY HOT BACK EX Apply 1 Application topically 2 (two) times daily as needed (back pain).   levalbuterol  0.63 MG/3ML nebulizer solution Commonly known as: XOPENEX  Take 3 mLs (0.63 mg total) by nebulization every 4 (four) hours as needed for wheezing or shortness of breath.   loratadine  10 MG tablet Commonly known as: CLARITIN  Take 1 tablet (10 mg total) by mouth daily for 14 days. What changed:  when to take this reasons to take this   nitroGLYCERIN  0.4 MG SL tablet Commonly known as: NITROSTAT  Place 0.4 mg under the tongue every 5 (five) minutes x 3 doses as needed for chest pain.  omeprazole 40 MG capsule Commonly known as: PRILOSEC Take 40 mg by mouth daily.   oxyCODONE  5 MG immediate release tablet Commonly known as: Oxy IR/ROXICODONE  Take 1 tablet (5 mg total) by mouth every 6 (six) hours as needed for up to 5 days for moderate pain (pain score 4-6).   pregabalin  50 MG capsule Commonly known as: LYRICA  Take 50 mg by mouth 2 (two) times daily.   spironolactone  25 MG tablet Commonly known as: Aldactone  Take 0.5 tablets (12.5 mg total) by mouth daily.   Thick-It Powd Generic drug: STARCH-MALTO DEXTRIN Make liquids nectar thick. Kindly dispense any thickening product to make liquids nectar thick consistency, 1 month supply no refills.   Trelegy Ellipta  200-62.5-25 MCG/ACT Aepb Generic drug: Fluticasone -Umeclidin-Vilant Inhale 1 puff into the lungs daily.   Voltaren  Arthritis Pain 1 % Gel Generic drug: diclofenac  Sodium Apply 1 Application topically in the morning and at bedtime.        Follow-up Information     [facility] Follow up.   Specialty: Home Health Services Why: Registered [place], Physical Therapy, Speech Therapy Contact information: [address] [place] [address] [phone]                Discharge Exam: Filed Weights   03/22/24 0933 03/22/24 1757  Weight: 72.8 kg 73.9 kg   General  exam: Appears calm and comfortable  Respiratory system: Clear to auscultation. Respiratory effort normal. Cardiovascular system: S1 & S2 heard, RRR. No JVD,  Gastrointestinal system: Abdomen is nondistended, soft and nontender.  Central nervous system: Alert and oriented. No focal neurological deficits. Extremities: Symmetric 5 x 5 power. Skin: No rashes, Psychiatry:  Mood & affect appropriate.    Condition at discharge: fair  The results of significant diagnostics from this hospitalization (including imaging, microbiology, ancillary and laboratory) are listed below for reference.   Imaging Studies: CT LUMBAR SPINE WO CONTRAST Result Date: 03/25/2024 CLINICAL DATA:  Low back pain worsening today EXAM: CT LUMBAR SPINE WITHOUT CONTRAST TECHNIQUE: Multidetector CT imaging of the lumbar spine was performed without intravenous contrast administration. Multiplanar CT image reconstructions were also generated. RADIATION DOSE REDUCTION: This exam was performed according to the departmental dose-optimization program which includes automated exposure control, adjustment of the mA and/or kV according to patient size and/or use of iterative reconstruction technique. COMPARISON:  02/28/2024 FINDINGS: Segmentation: 5 lumbar type vertebrae. Alignment: Normal. Vertebrae: There are no acute or destructive bony abnormalities. Bones are osteopenic. Paraspinal and other soft tissues: The paraspinal soft tissues appear unremarkable. Continued atherosclerosis of the abdominal aorta and its branches. Disc levels: Findings at individual levels are as follows: L1/L2: Broad-based disc bulge and mild bilateral facet hypertrophy result in mild symmetrical bilateral lateral recess and neural foraminal encroachment. L2/L3: Circumferential disc bulge with bilateral facet hypertrophy results in mild trefoil central canal stenosis and symmetrical bilateral neural foraminal encroachment. L3/L4: Circumferential disc bulge with  bilateral facet and ligamentum flavum hypertrophy results in mild central canal stenosis. Moderate symmetrical bilateral neural foraminal encroachment. L4/L5: Circumferential disc bulge with bilateral facet and ligamentum flavum hypertrophy results in mild central canal stenosis. Moderate right greater than left neural foraminal encroachment. L5/S1: Bilateral facet hypertrophy without significant compressive sequela. Reconstructed images demonstrate no additional findings. IMPRESSION: 1. No acute lumbar spine fracture. 2. Stable multilevel lumbar spondylosis and facet hypertrophy, most pronounced from L2-3 through L4-5 as above. Electronically Signed   By: Ozell Daring M.D.   On: 03/25/2024 16:00   CT Chest Wo Contrast  Result Date: 03/22/2024 CLINICAL DATA:  Shortness of breath and cough.  Hypoxia. EXAM: CT CHEST WITHOUT CONTRAST TECHNIQUE: Multidetector CT imaging of the chest was performed following the standard protocol without IV contrast. RADIATION DOSE REDUCTION: This exam was performed according to the departmental dose-optimization program which includes automated exposure control, adjustment of the mA and/or kV according to patient size and/or use of iterative reconstruction technique. COMPARISON:  Chest CT dated 02/28/2024. FINDINGS: Evaluation of this exam is limited in the absence of intravenous contrast. Cardiovascular: There is no cardiomegaly or pericardial effusion. There is coronary vascular calcification and calcification of the mitral annulus. Left pectoral pacemaker device. Mild atherosclerotic calcification of the thoracic aorta. No aneurysmal dilatation. There is mildly dilated main pulmonary trunk suggestive of pulmonary hypertension. Mediastinum/Nodes: No hilar adenopathy. Subcarinal lymph node measures 9 mm in short axis. A lymph node anterior to the carina measures 12 mm short axis. The esophagus is grossly unremarkable no mediastinal fluid collection. Lungs/Pleura: Background of  emphysema. Similar appearance of consolidative changes of the right middle lobe with air bronchogram, likely atelectasis. Linear and bandlike atelectasis/scarring at the lung bases. Faint areas of ground-glass density in the right lower lobe and to a lesser degree in the right upper lobe may be chronic and sequela of prior inflammatory process or represent residual or recurrent atypical pneumonia. The previously seen 5 mm nodule in the posteromedial right lung base is not visualized on today's exam. No pleural effusion or pneumothorax. The central airways are patent. Upper Abdomen: No acute abnormality. Musculoskeletal: Osteopenia with degenerative changes. No acute osseous pathology. IMPRESSION: 1. Faint areas of ground-glass density in the right lower lobe and to a lesser degree in the right upper lobe may be chronic and sequela of prior inflammatory process or represent residual or recurrent atypical pneumonia. 2. Similar appearance of consolidative changes of the right middle lobe with air bronchogram, likely atelectasis. 3. Aortic Atherosclerosis (ICD10-I70.0) and Emphysema (ICD10-J43.9). Electronically Signed   By: Vanetta Chou M.D.   On: 03/22/2024 14:41   DG Chest 1 View Result Date: 03/22/2024 CLINICAL DATA:  Shortness of breath, sepsis. EXAM: CHEST  1 VIEW COMPARISON:  03/03/2024 and CT chest 02/28/2024. FINDINGS: Patient is slightly rotated. Trachea is midline. Heart is enlarged, stable. Pacemaker lead tips are in the right atrium and right ventricle. Minimal streaky atelectasis in the lung bases. No pleural fluid. IMPRESSION: Minimal bibasilar streaky atelectasis. Electronically Signed   By: Newell Eke M.D.   On: 03/22/2024 10:57   ECHOCARDIOGRAM COMPLETE Result Date: 03/04/2024    ECHOCARDIOGRAM REPORT   Patient Name:   SHERREY NORTH [facility] Date of Exam: 03/04/2024 Medical Rec #:  [id number]         Height:       64.0 in Accession #:    [id number]        Weight:       163.4 lb Date of Birth:   August 03, 1940        BSA:          1.795 m Patient Age:    82 years          BP:           152/63 mmHg Patient Gender: F                 HR:           61 bpm. Exam Location:  Inpatient Procedure: 2D Echo, Cardiac Doppler and Color Doppler (Both Spectral and Color  Flow Doppler were utilized during procedure). Indications:    CHF-Acute Diastolic I50.31  History:        Patient has prior history of Echocardiogram examinations, most                 recent 10/15/2022. CHF, Previous Myocardial Infarction,                 Pacemaker, COPD and CKD, stage 3, Arrythmias:Tachycardia and                 Bradycardia, Signs/Symptoms:Hypotension and Chest Pain; Risk                 Factors:Hypertension and Dyslipidemia.  Sonographer:    Thea Norlander RCS Referring Phys: JACQUELINE PRASHANT K [facility] IMPRESSIONS  1. Left ventricular ejection fraction, by estimation, is 60 to 65%. The left ventricle has normal function. The left ventricle has no regional wall motion abnormalities. Left ventricular diastolic parameters are indeterminate.  2. Right ventricular systolic function is normal. The right ventricular size is normal.  3. Mild mitral valve regurgitation.  4. The aortic valve is tricuspid. Aortic valve regurgitation is mild.  5. The inferior vena cava is dilated in size with <50% respiratory variability, suggesting right atrial pressure of 15 mmHg. FINDINGS  Left Ventricle: Left ventricular ejection fraction, by estimation, is 60 to 65%. The left ventricle has normal function. The left ventricle has no regional wall motion abnormalities. The left ventricular internal cavity size was normal in size. There is  no left ventricular hypertrophy. Left ventricular diastolic parameters are indeterminate. Right Ventricle: The right ventricular size is normal. Right vetricular wall thickness was not assessed. Right ventricular systolic function is normal. Left Atrium: Left atrial size was normal in size. Right Atrium: Right atrial size  was normal in size. Pericardium: There is no evidence of pericardial effusion. Mitral Valve: There is mild thickening of the mitral valve leaflet(s). Mild mitral annular calcification. Mild mitral valve regurgitation. MV peak gradient, 5.9 mmHg. The mean mitral valve gradient is 2.5 mmHg. Tricuspid Valve: The tricuspid valve is normal in structure. Tricuspid valve regurgitation is mild. Aortic Valve: The aortic valve is tricuspid. Aortic valve regurgitation is mild. Aortic valve peak gradient measures 5.5 mmHg. Pulmonic Valve: The pulmonic valve was not well visualized. Pulmonic valve regurgitation is not visualized. No evidence of pulmonic stenosis. Aorta: The aortic root and ascending aorta are structurally normal, with no evidence of dilitation. Venous: The inferior vena cava is dilated in size with less than 50% respiratory variability, suggesting right atrial pressure of 15 mmHg.  LEFT VENTRICLE PLAX 2D LVIDd:         5.10 cm   Diastology LVIDs:         3.30 cm   LV e' medial:    2.68 cm/s LV PW:         0.90 cm   LV E/e' medial:  27.1 LV IVS:        1.10 cm   LV e' lateral:   3.71 cm/s LVOT diam:     2.10 cm   LV E/e' lateral: 19.6 LV SV:         78 LV SV Index:   44 LVOT Area:     3.46 cm  RIGHT VENTRICLE            IVC RV S prime:     6.83 cm/s  IVC diam: 2.10 cm TAPSE (M-mode): 1.7 cm LEFT ATRIUM  Index        RIGHT ATRIUM           Index LA diam:        4.40 cm 2.45 cm/m   RA Area:     10.30 cm LA Vol (A2C):   52.4 ml 29.19 ml/m  RA Volume:   17.40 ml  9.69 ml/m LA Vol (A4C):   48.2 ml 26.85 ml/m LA Biplane Vol: 50.0 ml 27.85 ml/m  AORTIC VALVE AV Area (Vmax): 2.79 cm AV Vmax:        117.67 cm/s AV Peak Grad:   5.5 mmHg LVOT Vmax:      94.70 cm/s LVOT Vmean:     60.600 cm/s LVOT VTI:       0.226 m  AORTA Ao Root diam: 2.90 cm Ao Asc diam:  3.50 cm MITRAL VALVE MV Area (PHT): 2.62 cm     SHUNTS MV Area VTI:   2.21 cm     Systemic VTI:  0.23 m MV Peak grad:  5.9 mmHg     Systemic Diam:  2.10 cm MV Mean grad:  2.5 mmHg MV Vmax:       1.21 m/s MV Vmean:      77.0 cm/s MV Decel Time: 289 msec MV E velocity: 72.60 cm/s MV A velocity: 120.00 cm/s MV E/A ratio:  0.60 Vina Gull MD Electronically signed by Vina Gull MD Signature Date/Time: 03/04/2024/3:46:05 PM    Final    DG CHEST PORT 1 VIEW Result Date: 03/03/2024 EXAM: 1 VIEW XRAY OF THE CHEST 03/03/2024 06:26:58 AM COMPARISON: None available. CLINICAL HISTORY: Hypoxia 200808. HYPOXIA,BI-PAP; ROVER FINDINGS: LUNGS AND PLEURA: Interval improvement in previously noted diffuse increased interstitial opacities. Persistent mild left lower lobe opacity may represent atelectasis or airspace disease. No pleural effusion. No pneumothorax. HEART AND MEDIASTINUM: Stable cardiac enlargement. Left chest wall pacer noted with leads in the right atrial appendage and right ventricle. BONES AND SOFT TISSUES: No acute osseous abnormality. IMPRESSION: 1. Interval improvement in previously noted interstitial edema. 2. Persistent mild left lower lobe opacity, possibly atelectasis or airspace disease. Electronically signed by: Waddell Calk MD 03/03/2024 07:04 AM EDT RP Workstation: [id number]   DG Chest Port 1 View Result Date: 03/02/2024 CLINICAL DATA:  Acute respiratory distress EXAM: PORTABLE CHEST 1 VIEW COMPARISON:  02/29/2024 FINDINGS: Lungs are symmetrically well expanded. No pneumothorax or pleural effusion. Diffuse interstitial pulmonary infiltrate, progressive since prior examination asymmetrically more severe within the left lung, most in keeping with mild asymmetric pulmonary edema. Left subclavian dual lead pacemaker in place with leads within the right atrium and right ventricle. Stable mild cardiomegaly. No acute bone abnormality. IMPRESSION: 1. Mild asymmetric pulmonary edema, progressive since prior examination. 2. Stable mild cardiomegaly. Electronically Signed   By: Dorethia Molt M.D.   On: 03/02/2024 23:00   DG Swallowing Func-Speech  Pathology Result Date: 03/01/2024 Table formatting from the original result was not included. Modified Barium Swallow Study Patient Details Name: SHAE HINNENKAMP MRN: [id number] Date of Birth: [birth year] Today's Date: 03/01/2024 HPI/PMH: HPI: WANDALEE KLANG is an 83 yo female presenting to ED 8/11 with back pain and shortness of breath. Admitted with sepsis secondary to PNA. CTA shows RUL patchy airspace disease consistent with multifocal PNA and new patchy ground-glass disease in the RLL. Recently hospitalized 7/28-7/29 for SIRS with question of UTI vs upper respiratory infection.  SLP consulted due to recurrent PNA. PMH includes COPD, chronic respiratory failure on 4L, HFpEF, CAD, tachybradycardia syndrome s/p permanent pacemaker,  Crohn's disease, SBO, migraine, CKD 3, anxiety Clinical Impression: Clinical Impression: Pt exhibits moderate pharyngeal dysphagia characterized primarily by mistiming and reduced sensation. The swallow is initiated at the pyriform sinuses and epiglottic inversion is incomplete, allowing airway invasion before and during the swallow. This was seen consistently with penetration above the vocal folds until she was challenged with larger sips, which resulted in aspiration with variable sensation (PAS 7, 8). A chin tuck posture did not improve airway protection. Nectar thick liquids result in penetration above the vocal folds that cued intermittent coughing clears (PAS 3). Additionally, retention of barium was noted when the 13 mm tablet was administered with thin liquids. This may contribute to the symptoms of globus and regurgitation that the pt described to this SLP during evalutation previous date (could consider further esophageal assessment). For now, recommend continuing regular diet but with nectar thick liquids pending ongoing education and discussion with the pt and medical team. Prolonged use of thickener is not recommended nor does it prevent aspiration or decrease risk of  adverse events that may occur in its presence. Will plan to f/u with MD and pt as able. Factors that may increase risk of adverse event in presence of aspiration Noe & Lianne 2021): Factors that may increase risk of adverse event in presence of aspiration Noe & Lianne 2021): Poor general health and/or compromised immunity; Respiratory or GI disease; Reduced cognitive function; Frail or deconditioned; Inadequate oral hygiene Recommendations/Plan: Swallowing Evaluation Recommendations Swallowing Evaluation Recommendations Recommendations: PO diet PO Diet Recommendation: Regular; Mildly thick liquids (Level 2, nectar thick) Liquid Administration via: Cup; Straw Medication Administration: Whole meds with puree Supervision: Patient able to self-feed; Intermittent supervision/cueing for swallowing strategies Swallowing strategies  : Minimize environmental distractions; Slow rate; Small bites/sips Postural changes: Position pt fully upright for meals; Stay upright 30-60 min after meals Oral care recommendations: Oral care QID (4x/day); Oral care before PO Recommended consults: Consider Palliative care Treatment Plan Treatment Plan Treatment recommendations: Therapy as outlined in treatment plan below Follow-up recommendations: Outpatient SLP Functional status assessment: Patient has had a recent decline in their functional status and demonstrates the ability to make significant improvements in function in a reasonable and predictable amount of time. Treatment frequency: Min 2x/week Treatment duration: 2 weeks Interventions: Aspiration precaution training; Patient/family education; Trials of upgraded texture/liquids; Diet toleration management by SLP Recommendations Recommendations for follow up therapy are one component of a multi-disciplinary discharge planning process, led by the attending physician.  Recommendations may be updated based on patient status, additional functional criteria and insurance  authorization. Assessment: Orofacial Exam: Orofacial Exam Oral Cavity: Oral Hygiene: WFL Oral Cavity - Dentition: Missing dentition; Poor condition Orofacial Anatomy: WFL Oral Motor/Sensory Function: WFL Anatomy: Anatomy: Suspected cervical osteophytes Boluses Administered: Boluses Administered Boluses Administered: Thin liquids (Level 0); Mildly thick liquids (Level 2, nectar thick); Moderately thick liquids (Level 3, honey thick); Puree; Solid  Oral Impairment Domain: Oral Impairment Domain Lip Closure: No labial escape Tongue control during bolus hold: Cohesive bolus between tongue to palatal seal Bolus preparation/mastication: Timely and efficient chewing and mashing Bolus transport/lingual motion: Brisk tongue motion Oral residue: Complete oral clearance Location of oral residue : N/A Initiation of pharyngeal swallow : Pyriform sinuses  Pharyngeal Impairment Domain: Pharyngeal Impairment Domain Soft palate elevation: No bolus between soft palate (SP)/pharyngeal wall (PW) Laryngeal elevation: Complete superior movement of thyroid  cartilage with complete approximation of arytenoids to epiglottic petiole Anterior hyoid excursion: Complete anterior movement Epiglottic movement: Partial inversion Laryngeal vestibule closure: Incomplete, narrow column air/contrast  in laryngeal vestibule Pharyngeal stripping wave : Present - complete Pharyngeal contraction (A/P view only): N/A Pharyngoesophageal segment opening: Complete distension and complete duration, no obstruction of flow Tongue base retraction: No contrast between tongue base and posterior pharyngeal wall (PPW) Pharyngeal residue: Complete pharyngeal clearance Location of pharyngeal residue: N/A  Esophageal Impairment Domain: Esophageal Impairment Domain Esophageal clearance upright position: Esophageal retention Pill: Pill Consistency administered: Thin liquids (Level 0) Thin liquids (Level 0): Impaired (see clinical impressions) Penetration/Aspiration Scale  Score: Penetration/Aspiration Scale Score 1.  Material does not enter airway: Solid; Pill 2.  Material enters airway, remains ABOVE vocal cords then ejected out: Moderately thick liquids (Level 3, honey thick); Puree 3.  Material enters airway, remains ABOVE vocal cords and not ejected out: Mildly thick liquids (Level 2, nectar thick) Compensatory Strategies: Compensatory Strategies Compensatory strategies: Yes Chin tuck: Ineffective Ineffective Chin Tuck: Thin liquid (Level 0)   General Information: Caregiver present: No  Diet Prior to this Study: Regular; Thin liquids (Level 0)   Temperature : Normal   Respiratory Status: WFL   Supplemental O2: Nasal cannula   History of Recent Intubation: No  Behavior/Cognition: Alert; Cooperative Self-Feeding Abilities: Able to self-feed Baseline vocal quality/speech: Normal Volitional Cough: Able to elicit Volitional Swallow: Able to elicit Exam Limitations: No limitations Goal Planning: Prognosis for improved oropharyngeal function: Fair Barriers to Reach Goals: Time post onset No data recorded Patient/Family Stated Goal: none stated Consulted and agree with results and recommendations: Patient; Physician Pain: Pain Assessment Pain Assessment: No/denies pain End of Session: Start Time:SLP Start Time (ACUTE ONLY): 1505 Stop Time: SLP Stop Time (ACUTE ONLY): 1524 Time Calculation:SLP Time Calculation (min) (ACUTE ONLY): 19 min Charges: SLP Evaluations $ SLP Speech Visit: 1 Visit SLP Evaluations $BSS Swallow: 1 Procedure $MBS Swallow: 1 Procedure $Swallowing Treatment: 1 Procedure SLP visit diagnosis: SLP Visit Diagnosis: Dysphagia, pharyngoesophageal phase (R13.14) Past Medical History: Past Medical History: Diagnosis Date  Acute CHF (congestive heart failure) (HCC) 01/02/2022  AKI (acute kidney injury) (HCC) 09/08/2020  Aspiration pneumonia (HCC) 01/05/2019  Atypical chest pain 03/11/2022  Bradycardia 03/14/2020  C. difficile colitis 04/03/2021  CHF (congestive heart failure)  (HCC)   COPD (chronic obstructive pulmonary disease) (HCC)   Coronary artery disease   Tachycardia 05/31/2013 Past Surgical History: Past Surgical History: Procedure Laterality Date  ABDOMINAL HYSTERECTOMY    BOWEL RESECTION    CHOLECYSTECTOMY   Damien Blumenthal, M.A., CCC-SLP Speech Language Pathology, Acute Rehabilitation Services Secure Chat preferred [phone] 03/01/2024, 4:37 PM  DG Chest Port 1 View Result Date: 02/29/2024 EXAM: 1 VIEW XRAY OF THE CHEST 02/29/2024 06:15:00 AM COMPARISON: 02/28/2024 CLINICAL HISTORY: Shortness of breath. FINDINGS: LUNGS AND PLEURA: New blunting of the left costophrenic angle, which may reflect a small effusion. Mild diffuse increase interstitial markings. Bibasilar atelectasis. Decreased lung volumes. HEART AND MEDIASTINUM: Stable cardiac enlargement. Left chest wall pacer noted with leads in the right atrial appendage and right ventricle. BONES AND SOFT TISSUES: Aortic atherosclerotic calcification. IMPRESSION: 1. Suspect new small left pleural effusion with increased interstitial markings suggestive of mild chf. 2. Low lung volumes with Bibasilar Atelectasis. 3. Stable cardiac enlargement with left chest wall pacer in place. Electronically signed by: Waddell Calk MD 02/29/2024 08:00 AM EDT RP Workstation: [id number]   CT HEAD WO CONTRAST ( ) Result Date: 02/28/2024 CLINICAL DATA:  Delirium, shortness of breath, back pain, chest pain, and sepsis. EXAM: CT HEAD WITHOUT CONTRAST CT ANGIOGRAPHY CHEST CT ABDOMEN AND PELVIS WITH CONTRAST TECHNIQUE: Contiguous axial images were obtained from the base of  the skull through the vertex without intravenous contrast. Multiplanar reconstructions were created and reviewed. Multidetector CT imaging of the chest was performed using the standard protocol during bolus administration of intravenous contrast. Multiplanar CT image reconstructions and MIPs were obtained to evaluate the vascular anatomy. Multidetector CT imaging of the  abdomen and pelvis was performed using the standard protocol during bolus administration of intravenous contrast. RADIATION DOSE REDUCTION: This exam was performed according to the departmental dose-optimization program which includes automated exposure control, adjustment of the mA and/or kV according to patient size and/or use of iterative reconstruction technique. CONTRAST:  75mL OMNIPAQUE  IOHEXOL  350 MG/ML SOLN COMPARISON:  Head CT 02/14/2024, portable chest today, portable chest 02/14/2024, chest CT 09/30/2023, abdomen and pelvis CT with contrast 01/02/2022, and abdomen and pelvis CT without contrast 08/11/2023. FINDINGS: CT HEAD WITHOUT CONTRAST FINDINGS Brain: There is mild atrophy and small-vessel disease. The ventricles are normal in size and position. No acute cortical based infarct, hemorrhage, mass or mass effect is seen. Basal cisterns are clear. Vascular: No hyperdense vessels or unexpected calcification. Skull: Negative for fractures or focal lesions. Sinuses/orbits: No acute findings. Old lens extractions. Clear sinuses and mastoids. Other: None. CTA CHEST FINDINGS Cardiovascular: Left chest dual lead pacing system again noted, unchanged. No significant pericardial fluid. Mild-to-moderate panchamber cardiomegaly. 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 There is atherosclerosis moderately in the aorta, scattered 2 vessel coronary calcifications LAD and circumflex, scattered plaque in the great vessels. There is no aortic aneurysm, stenosis or dissection. There is an enlarged pulmonary trunk, unchanged measuring 3.7 cm indicating arterial hypertension. Arterial opacification is diagnostic. No arterial embolism is seen. No venous dilatation. Mediastinum/Nodes: There are a few bilateral tiny hypodense thyroid  nodules. No follow-up imaging is recommended. Axillary spaces are clear. There stable enlarged right mid hilar nodes to 1.3 cm in short axis, stable  mediastinal adenopathy with right paratracheal nodes to 1.3 cm, single prevascular node 0.8 cm, subcarinal nodes up to 1.4 cm. No new or progressive adenopathy is seen. The thoracic trachea, main bronchi, and thoracic esophagus are unremarkable. Small hiatal hernia. Lungs/Pleura: Both lungs are moderately emphysematous with centrilobular changes predominating. There is no pleural effusion. Diffuse bronchial thickening. Chronic atelectasis again noted in the lateral segment right middle lobe. There is patchy airspace disease in the right upper lobe consistent with multifocal pneumonia. There are coarse atelectatic changes in both lower lobes and a few linear scar-like opacities both apices and bases. There is new patchy ground-glass disease in the right lower lobe favored to be pneumonitis, versus asymmetric atelectasis. There is a new 5 mm rounded nodule in the posteromedial base of the right lower lobe on 9:97 probably an inflammatory nodule. Stable 3 mm right lower lobe nodule on 9:113. There is a stable 4 mm left upper lobe nodule laterally on 9:42. Musculoskeletal: There is osteopenia, kyphosis and degenerative change of the thoracic spine. No acute or significant osseous findings. Review of the MIP images confirms the above findings. CT ABDOMEN and PELVIS FINDINGS Hepatobiliary: No focal liver abnormality is seen. Status post cholecystectomy. There is chronic mild prominence of the intrahepatic and extrahepatic bile ducts. No obstructing stone is seen. Pancreas: Moderately atrophic.  Otherwise unremarkable. Spleen: No abnormality.  Small splenule at the hilum. Adrenals/Urinary Tract: No adrenal mass. Three Bosniak 1 left renal cysts are again noted, largest is in the upper pole measuring 2.8 cm. There are occasional bilateral Bosniak 2 subcentimeter cortical cysts for which are  too small to characterize. No follow-up imaging is recommended. There is no urinary stone or obstruction. There is symmetric excretion  on delayed images. The bladder is unremarkable for the degree of distension. Stomach/Bowel: Chronic thickened folds in the stomach. Normal caliber small bowel. Right hemicolectomy with primary end to side anastomosis again is noted. There is scattered colonic diverticulosis evidence of colitis or diverticulitis. Vascular/Lymphatic: Aortic atherosclerosis. No enlarged abdominal or pelvic lymph nodes. Reproductive: Status post hysterectomy. No adnexal masses. Other: Pelvic floor laxity.  No cystocele. Musculoskeletal: Osteopenia and degenerative change lumbar spine. No acute or significant osseous findings. Mild lumbar dextroscoliosis. Review of the MIP images confirms the above findings. IMPRESSION: 1. No acute intracranial CT findings or interval changes. Stable exam. 2. 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 3. Enlarged pulmonary trunk indicating arterial hypertension. No arterial embolus is seen. 4. Right upper lobe patchy airspace disease consistent with multifocal pneumonia. 5. New patchy ground-glass disease in the right lower lobe favored to be pneumonitis, versus asymmetric atelectasis. 6. New 5 mm rounded nodule in the posteromedial base of the right lower lobe, probably an inflammatory nodule. Follow-up study recommended after treatment of pneumonia. 7. Stable mediastinal and right hilar adenopathy. 8. Emphysema and bronchitis. 9. No acute findings in the abdomen or pelvis. 10. Chronic thickened folds in the stomach. 11. Diverticulosis without evidence of diverticulitis. 12. Pelvic floor laxity. 13. Osteopenia and degenerative change. Aortic Atherosclerosis (ICD10-I70.0) and Emphysema (ICD10-J43.9). Electronically Signed   By: Francis Quam M.D.   On: 02/28/2024 06:03   CT Angio Chest Pulmonary Embolism (PE) W or WO Contrast Result Date: 02/28/2024 CLINICAL DATA:  Delirium, shortness of breath, back pain, chest pain, and sepsis. EXAM: CT HEAD WITHOUT CONTRAST CT ANGIOGRAPHY CHEST  CT ABDOMEN AND PELVIS WITH CONTRAST TECHNIQUE: Contiguous axial images were obtained from the base of the skull through the vertex without intravenous contrast. Multiplanar reconstructions were created and reviewed. Multidetector CT imaging of the chest was performed using the standard protocol during bolus administration of intravenous contrast. Multiplanar CT image reconstructions and MIPs were obtained to evaluate the vascular anatomy. Multidetector CT imaging of the abdomen and pelvis was performed using the standard protocol during bolus administration of intravenous contrast. RADIATION DOSE REDUCTION: This exam was performed according to the departmental dose-optimization program which includes automated exposure control, adjustment of the mA and/or kV according to patient size and/or use of iterative reconstruction technique. CONTRAST:  75mL OMNIPAQUE  IOHEXOL  350 MG/ML SOLN COMPARISON:  Head CT 02/14/2024, portable chest today, portable chest 02/14/2024, chest CT 09/30/2023, abdomen and pelvis CT with contrast 01/02/2022, and abdomen and pelvis CT without contrast 08/11/2023. FINDINGS: CT HEAD WITHOUT CONTRAST FINDINGS Brain: There is mild atrophy and small-vessel disease. The ventricles are normal in size and position. No acute cortical based infarct, hemorrhage, mass or mass effect is seen. Basal cisterns are clear. Vascular: No hyperdense vessels or unexpected calcification. Skull: Negative for fractures or focal lesions. Sinuses/orbits: No acute findings. Old lens extractions. Clear sinuses and mastoids. Other: None. CTA CHEST FINDINGS Cardiovascular: Left chest dual lead pacing system again noted, unchanged. No significant pericardial fluid. Mild-to-moderate panchamber cardiomegaly. 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 There is atherosclerosis moderately in the aorta, scattered 2 vessel coronary calcifications LAD and circumflex, scattered plaque in  the great vessels. There is no aortic aneurysm, stenosis or dissection. There is an enlarged pulmonary trunk, unchanged measuring 3.7 cm indicating arterial hypertension. Arterial opacification is diagnostic. No  arterial embolism is seen. No venous dilatation. Mediastinum/Nodes: There are a few bilateral tiny hypodense thyroid  nodules. No follow-up imaging is recommended. Axillary spaces are clear. There stable enlarged right mid hilar nodes to 1.3 cm in short axis, stable mediastinal adenopathy with right paratracheal nodes to 1.3 cm, single prevascular node 0.8 cm, subcarinal nodes up to 1.4 cm. No new or progressive adenopathy is seen. The thoracic trachea, main bronchi, and thoracic esophagus are unremarkable. Small hiatal hernia. Lungs/Pleura: Both lungs are moderately emphysematous with centrilobular changes predominating. There is no pleural effusion. Diffuse bronchial thickening. Chronic atelectasis again noted in the lateral segment right middle lobe. There is patchy airspace disease in the right upper lobe consistent with multifocal pneumonia. There are coarse atelectatic changes in both lower lobes and a few linear scar-like opacities both apices and bases. There is new patchy ground-glass disease in the right lower lobe favored to be pneumonitis, versus asymmetric atelectasis. There is a new 5 mm rounded nodule in the posteromedial base of the right lower lobe on 9:97 probably an inflammatory nodule. Stable 3 mm right lower lobe nodule on 9:113. There is a stable 4 mm left upper lobe nodule laterally on 9:42. Musculoskeletal: There is osteopenia, kyphosis and degenerative change of the thoracic spine. No acute or significant osseous findings. Review of the MIP images confirms the above findings. CT ABDOMEN and PELVIS FINDINGS Hepatobiliary: No focal liver abnormality is seen. Status post cholecystectomy. There is chronic mild prominence of the intrahepatic and extrahepatic bile ducts. No obstructing  stone is seen. Pancreas: Moderately atrophic.  Otherwise unremarkable. Spleen: No abnormality.  Small splenule at the hilum. Adrenals/Urinary Tract: No adrenal mass. Three Bosniak 1 left renal cysts are again noted, largest is in the upper pole measuring 2.8 cm. There are occasional bilateral Bosniak 2 subcentimeter cortical cysts for which are too small to characterize. No follow-up imaging is recommended. There is no urinary stone or obstruction. There is symmetric excretion on delayed images. The bladder is unremarkable for the degree of distension. Stomach/Bowel: Chronic thickened folds in the stomach. Normal caliber small bowel. Right hemicolectomy with primary end to side anastomosis again is noted. There is scattered colonic diverticulosis evidence of colitis or diverticulitis. Vascular/Lymphatic: Aortic atherosclerosis. No enlarged abdominal or pelvic lymph nodes. Reproductive: Status post hysterectomy. No adnexal masses. Other: Pelvic floor laxity.  No cystocele. Musculoskeletal: Osteopenia and degenerative change lumbar spine. No acute or significant osseous findings. Mild lumbar dextroscoliosis. Review of the MIP images confirms the above findings. IMPRESSION: 1. No acute intracranial CT findings or interval changes. Stable exam. 2. 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 3. Enlarged pulmonary trunk indicating arterial hypertension. No arterial embolus is seen. 4. Right upper lobe patchy airspace disease consistent with multifocal pneumonia. 5. New patchy ground-glass disease in the right lower lobe favored to be pneumonitis, versus asymmetric atelectasis. 6. New 5 mm rounded nodule in the posteromedial base of the right lower lobe, probably an inflammatory nodule. Follow-up study recommended after treatment of pneumonia. 7. Stable mediastinal and right hilar adenopathy. 8. Emphysema and bronchitis. 9. No acute findings in the abdomen or pelvis. 10. Chronic thickened folds in the  stomach. 11. Diverticulosis without evidence of diverticulitis. 12. Pelvic floor laxity. 13. Osteopenia and degenerative change. Aortic Atherosclerosis (ICD10-I70.0) and Emphysema (ICD10-J43.9). Electronically Signed   By: Francis Quam M.D.   On: 02/28/2024 06:03   CT ABDOMEN PELVIS W CONTRAST Result Date: 02/28/2024 CLINICAL DATA:  Delirium, shortness of breath, back pain, chest pain, and sepsis.  EXAM: CT HEAD WITHOUT CONTRAST CT ANGIOGRAPHY CHEST CT ABDOMEN AND PELVIS WITH CONTRAST TECHNIQUE: Contiguous axial images were obtained from the base of the skull through the vertex without intravenous contrast. Multiplanar reconstructions were created and reviewed. Multidetector CT imaging of the chest was performed using the standard protocol during bolus administration of intravenous contrast. Multiplanar CT image reconstructions and MIPs were obtained to evaluate the vascular anatomy. Multidetector CT imaging of the abdomen and pelvis was performed using the standard protocol during bolus administration of intravenous contrast. RADIATION DOSE REDUCTION: This exam was performed according to the departmental dose-optimization program which includes automated exposure control, adjustment of the mA and/or kV according to patient size and/or use of iterative reconstruction technique. CONTRAST:  75mL OMNIPAQUE  IOHEXOL  350 MG/ML SOLN COMPARISON:  Head CT 02/14/2024, portable chest today, portable chest 02/14/2024, chest CT 09/30/2023, abdomen and pelvis CT with contrast 01/02/2022, and abdomen and pelvis CT without contrast 08/11/2023. FINDINGS: CT HEAD WITHOUT CONTRAST FINDINGS Brain: There is mild atrophy and small-vessel disease. The ventricles are normal in size and position. No acute cortical based infarct, hemorrhage, mass or mass effect is seen. Basal cisterns are clear. Vascular: No hyperdense vessels or unexpected calcification. Skull: Negative for fractures or focal lesions. Sinuses/orbits: No acute findings.  Old lens extractions. Clear sinuses and mastoids. Other: None. CTA CHEST FINDINGS Cardiovascular: Left chest dual lead pacing system again noted, unchanged. No significant pericardial fluid. Mild-to-moderate panchamber cardiomegaly. 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 There is atherosclerosis moderately in the aorta, scattered 2 vessel coronary calcifications LAD and circumflex, scattered plaque in the great vessels. There is no aortic aneurysm, stenosis or dissection. There is an enlarged pulmonary trunk, unchanged measuring 3.7 cm indicating arterial hypertension. Arterial opacification is diagnostic. No arterial embolism is seen. No venous dilatation. Mediastinum/Nodes: There are a few bilateral tiny hypodense thyroid  nodules. No follow-up imaging is recommended. Axillary spaces are clear. There stable enlarged right mid hilar nodes to 1.3 cm in short axis, stable mediastinal adenopathy with right paratracheal nodes to 1.3 cm, single prevascular node 0.8 cm, subcarinal nodes up to 1.4 cm. No new or progressive adenopathy is seen. The thoracic trachea, main bronchi, and thoracic esophagus are unremarkable. Small hiatal hernia. Lungs/Pleura: Both lungs are moderately emphysematous with centrilobular changes predominating. There is no pleural effusion. Diffuse bronchial thickening. Chronic atelectasis again noted in the lateral segment right middle lobe. There is patchy airspace disease in the right upper lobe consistent with multifocal pneumonia. There are coarse atelectatic changes in both lower lobes and a few linear scar-like opacities both apices and bases. There is new patchy ground-glass disease in the right lower lobe favored to be pneumonitis, versus asymmetric atelectasis. There is a new 5 mm rounded nodule in the posteromedial base of the right lower lobe on 9:97 probably an inflammatory nodule. Stable 3 mm right lower lobe nodule on 9:113. There is a  stable 4 mm left upper lobe nodule laterally on 9:42. Musculoskeletal: There is osteopenia, kyphosis and degenerative change of the thoracic spine. No acute or significant osseous findings. Review of the MIP images confirms the above findings. CT ABDOMEN and PELVIS FINDINGS Hepatobiliary: No focal liver abnormality is seen. Status post cholecystectomy. There is chronic mild prominence of the intrahepatic and extrahepatic bile ducts. No obstructing stone is seen. Pancreas: Moderately atrophic.  Otherwise unremarkable. Spleen: No abnormality.  Small splenule at the hilum. Adrenals/Urinary Tract: No adrenal mass. Three Bosniak 1 left renal cysts  are again noted, largest is in the upper pole measuring 2.8 cm. There are occasional bilateral Bosniak 2 subcentimeter cortical cysts for which are too small to characterize. No follow-up imaging is recommended. There is no urinary stone or obstruction. There is symmetric excretion on delayed images. The bladder is unremarkable for the degree of distension. Stomach/Bowel: Chronic thickened folds in the stomach. Normal caliber small bowel. Right hemicolectomy with primary end to side anastomosis again is noted. There is scattered colonic diverticulosis evidence of colitis or diverticulitis. Vascular/Lymphatic: Aortic atherosclerosis. No enlarged abdominal or pelvic lymph nodes. Reproductive: Status post hysterectomy. No adnexal masses. Other: Pelvic floor laxity.  No cystocele. Musculoskeletal: Osteopenia and degenerative change lumbar spine. No acute or significant osseous findings. Mild lumbar dextroscoliosis. Review of the MIP images confirms the above findings. IMPRESSION: 1. No acute intracranial CT findings or interval changes. Stable exam. 2. 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 3. Enlarged pulmonary trunk indicating arterial hypertension. No arterial embolus is seen. 4. Right upper lobe patchy airspace disease consistent with multifocal  pneumonia. 5. New patchy ground-glass disease in the right lower lobe favored to be pneumonitis, versus asymmetric atelectasis. 6. New 5 mm rounded nodule in the posteromedial base of the right lower lobe, probably an inflammatory nodule. Follow-up study recommended after treatment of pneumonia. 7. Stable mediastinal and right hilar adenopathy. 8. Emphysema and bronchitis. 9. No acute findings in the abdomen or pelvis. 10. Chronic thickened folds in the stomach. 11. Diverticulosis without evidence of diverticulitis. 12. Pelvic floor laxity. 13. Osteopenia and degenerative change. Aortic Atherosclerosis (ICD10-I70.0) and Emphysema (ICD10-J43.9). Electronically Signed   By: Francis Quam M.D.   On: 02/28/2024 06:03   DG Chest Port 1 View Result Date: 02/28/2024 CLINICAL DATA:  Shortness of breath EXAM: PORTABLE CHEST 1 VIEW COMPARISON:  02/14/2024 FINDINGS: Cardiac shadow is enlarged but stable. Pacing device is again seen. Lungs are well aerated bilaterally. No focal infiltrate or sizable effusion is noted. IMPRESSION: No acute abnormality noted. Electronically Signed   By: Oneil Devonshire M.D.   On: 02/28/2024 03:42    Microbiology: Results for orders placed or performed during the hospital encounter of 03/22/24  Resp panel by RT-PCR (RSV, Flu A&B, Covid) Anterior Nasal Swab     Status: None   Collection Time: 03/22/24  9:24 AM   Specimen: Anterior Nasal Swab  Result Value Ref Range Status   SARS Coronavirus 2 by RT PCR NEGATIVE NEGATIVE Final   Influenza A by PCR NEGATIVE NEGATIVE Final   Influenza B by PCR NEGATIVE NEGATIVE Final    Comment: (NOTE) The Xpert Xpress SARS-CoV-2/FLU/RSV plus assay is intended as an aid in the diagnosis of influenza from Nasopharyngeal swab specimens and should not be used as a sole basis for treatment. Nasal washings and aspirates are unacceptable for Xpert Xpress SARS-CoV-2/FLU/RSV testing.  Fact Sheet for  Patients: BloggerCourse.com  Fact Sheet for Healthcare Providers: SeriousBroker.it  This test is not yet approved or cleared by the [place]  FDA and has been authorized for detection and/or diagnosis of SARS-CoV-2 by FDA under an Emergency Use Authorization (EUA). This EUA will remain in effect (meaning this test can be used) for the duration of the COVID-19 declaration under Section 564(b)(1) of the Act, 21 U.S.C. section 360bbb-3(b)(1), unless the authorization is terminated or revoked.     Resp Syncytial Virus by PCR NEGATIVE NEGATIVE Final    Comment: (NOTE) Fact Sheet for Patients: BloggerCourse.com  Fact Sheet for Healthcare Providers: SeriousBroker.it  This test is not  yet approved or cleared by the [place]  FDA and has been authorized for detection and/or diagnosis of SARS-CoV-2 by FDA under an Emergency Use Authorization (EUA). This EUA will remain in effect (meaning this test can be used) for the duration of the COVID-19 declaration under Section 564(b)(1) of the Act, 21 U.S.C. section 360bbb-3(b)(1), unless the authorization is terminated or revoked.  Performed at [facility], [address], [place], [place] [address]   Culture, blood (routine x 2)     Status: None   Collection Time: 03/22/24  9:29 AM   Specimen: BLOOD LEFT WRIST  Result Value Ref Range Status   Specimen Description BLOOD LEFT WRIST  Final   Special Requests   Final    BOTTLES DRAWN AEROBIC AND ANAEROBIC Blood Culture adequate volume   Culture   Final    NO GROWTH 5 DAYS Performed at [facility], [name] [place], [place] [address]    Report Status 03/27/2024 FINAL  Final  Culture, blood (routine x 2)     Status: None   Collection Time: 03/22/24 10:47 AM   Specimen: BLOOD  Result Value Ref Range Status   Specimen Description BLOOD RIGHT ANTECUBITAL  Final    Special Requests   Final    BOTTLES DRAWN AEROBIC AND ANAEROBIC Blood Culture results may not be optimal due to an inadequate volume of blood received in culture bottles   Culture   Final    NO GROWTH 5 DAYS Performed at [facility], [address] [place], [place] [address]    Report Status 03/27/2024 FINAL  Final  MRSA Next Gen by PCR, Nasal     Status: None   Collection Time: 03/22/24  9:19 PM   Specimen: Nasal Mucosa; Nasal Swab  Result Value Ref Range Status   MRSA by PCR Next Gen NOT DETECTED NOT DETECTED Final    Comment: (NOTE) The GeneXpert MRSA Assay (FDA approved for NASAL specimens only), is one component of a comprehensive MRSA colonization surveillance program. It is not intended to diagnose MRSA infection nor to guide or monitor treatment for MRSA infections. Test performance is not FDA approved in patients less than 44 years old. Performed at [facility], [address], [place], [place] [address]   Gastrointestinal Panel by PCR , Stool     Status: None   Collection Time: 03/22/24  9:28 PM   Specimen: STOOL  Result Value Ref Range Status   Campylobacter species NOT DETECTED NOT DETECTED Final   Plesimonas shigelloides NOT DETECTED NOT DETECTED Final   Salmonella species NOT DETECTED NOT DETECTED Final   Yersinia enterocolitica NOT DETECTED NOT DETECTED Final   Vibrio species NOT DETECTED NOT DETECTED Final   Vibrio cholerae NOT DETECTED NOT DETECTED Final   Enteroaggregative E coli (EAEC) NOT DETECTED NOT DETECTED Final   Enteropathogenic E coli (EPEC) NOT DETECTED NOT DETECTED Final   Enterotoxigenic E coli (ETEC) NOT DETECTED NOT DETECTED Final   Shiga like toxin producing E coli (STEC) NOT DETECTED NOT DETECTED Final   Shigella/Enteroinvasive E coli (EIEC) NOT DETECTED NOT DETECTED Final   Cryptosporidium NOT DETECTED NOT DETECTED Final   Cyclospora cayetanensis NOT DETECTED NOT DETECTED Final   Entamoeba histolytica NOT DETECTED NOT DETECTED  Final   Giardia lamblia NOT DETECTED NOT DETECTED Final   Adenovirus F40/41 NOT DETECTED NOT DETECTED Final   Astrovirus NOT DETECTED NOT DETECTED Final   Norovirus GI/GII NOT DETECTED NOT DETECTED Final  Rotavirus A NOT DETECTED NOT DETECTED Final   Sapovirus (I, II, IV, and V) NOT DETECTED NOT DETECTED Final    Comment: Performed at [facility], [address] [place], [place] [address]  C Difficile Quick Screen w PCR reflex     Status: None   Collection Time: 03/22/24  9:28 PM   Specimen: STOOL  Result Value Ref Range Status   C Diff antigen NEGATIVE NEGATIVE Final   C Diff toxin NEGATIVE NEGATIVE Final   C Diff interpretation No C. difficile detected.  Final    Comment: Performed at [facility], [address], [place], [place] [address]    Labs: CBC: Recent Labs  Lab 03/22/24 [phone] 03/22/24 0947 03/22/24 1000 03/22/24 1036 03/23/24 0515 03/24/24 0430  WBC 12.8*  --   --   --  5.5 5.2  NEUTROABS 11.5*  --   --   --   --  3.8  HGB 12.9 12.6 12.6 12.6 10.6* 11.1*  HCT 40.3 37.0 37.0 37.0 33.2* 35.0*  MCV 91.8  --   --   --  91.7 91.1  PLT 179  --   --   --  138* 159   Basic Metabolic Panel: Recent Labs  Lab 03/22/24 0947 03/22/24 1000 03/22/24 1036 03/23/24 0515 03/24/24 0430  NA 137 138 140 143 140  K 3.9 3.8 3.9 3.6 4.4  CL  --   --  98 103 105  CO2  --   --   --  26 27  GLUCOSE  --   --  112* 114* 109*  BUN  --   --  40* 34* 25*  CREATININE  --   --  2.10* 1.30* 1.03*  CALCIUM  --   --   --  9.0 9.3   Liver Function Tests: Recent Labs  Lab 03/22/24 0923 03/23/24 0515  AST 33 26  ALT 11 10  ALKPHOS 34* 29*  BILITOT 0.9 0.9  PROT 7.3 5.9*  ALBUMIN 3.9 3.1*   CBG: No results for input(s): GLUCAP in the last 168 hours.  Discharge time spent: 43 minutes.   Signed: Diahn Waidelich, MD [facility]

## 2024-03-30 ENCOUNTER — Other Ambulatory Visit: Payer: Self-pay

## 2024-03-30 ENCOUNTER — Inpatient Hospital Stay (HOSPITAL_COMMUNITY)
Admission: EM | Admit: 2024-03-30 | Discharge: 2024-04-03 | DRG: 682 | Disposition: A | Discharge status: Home Health | Attending: Internal Medicine | Admitting: Internal Medicine

## 2024-03-30 ENCOUNTER — Encounter (HOSPITAL_COMMUNITY): Payer: Self-pay | Admitting: *Deleted

## 2024-03-30 DIAGNOSIS — Z880 Allergy status to penicillin: Secondary | ICD-10-CM

## 2024-03-30 DIAGNOSIS — Z7901 Long term (current) use of anticoagulants: Secondary | ICD-10-CM

## 2024-03-30 DIAGNOSIS — I2510 Atherosclerotic heart disease of native coronary artery without angina pectoris: Secondary | ICD-10-CM | POA: Diagnosis present

## 2024-03-30 DIAGNOSIS — E872 Acidosis, unspecified: Secondary | ICD-10-CM | POA: Diagnosis present

## 2024-03-30 DIAGNOSIS — E860 Dehydration: Secondary | ICD-10-CM | POA: Diagnosis present

## 2024-03-30 DIAGNOSIS — I130 Hypertensive heart and chronic kidney disease with heart failure and stage 1 through stage 4 chronic kidney disease, or unspecified chronic kidney disease: Secondary | ICD-10-CM | POA: Diagnosis present

## 2024-03-30 DIAGNOSIS — G629 Polyneuropathy, unspecified: Secondary | ICD-10-CM | POA: Diagnosis present

## 2024-03-30 DIAGNOSIS — N179 Acute kidney failure, unspecified: Principal | ICD-10-CM | POA: Diagnosis present

## 2024-03-30 DIAGNOSIS — Z7951 Long term (current) use of inhaled steroids: Secondary | ICD-10-CM

## 2024-03-30 DIAGNOSIS — K5090 Crohn's disease, unspecified, without complications: Secondary | ICD-10-CM | POA: Diagnosis present

## 2024-03-30 DIAGNOSIS — N1831 Chronic kidney disease, stage 3a: Secondary | ICD-10-CM | POA: Diagnosis present

## 2024-03-30 DIAGNOSIS — N390 Urinary tract infection, site not specified: Secondary | ICD-10-CM | POA: Diagnosis present

## 2024-03-30 DIAGNOSIS — Z79899 Other long term (current) drug therapy: Secondary | ICD-10-CM

## 2024-03-30 DIAGNOSIS — Z885 Allergy status to narcotic agent status: Secondary | ICD-10-CM

## 2024-03-30 DIAGNOSIS — G8929 Other chronic pain: Secondary | ICD-10-CM | POA: Diagnosis present

## 2024-03-30 DIAGNOSIS — M4800 Spinal stenosis, site unspecified: Secondary | ICD-10-CM | POA: Diagnosis present

## 2024-03-30 DIAGNOSIS — I5033 Acute on chronic diastolic (congestive) heart failure: Secondary | ICD-10-CM | POA: Diagnosis not present

## 2024-03-30 DIAGNOSIS — R4182 Altered mental status, unspecified: Principal | ICD-10-CM

## 2024-03-30 DIAGNOSIS — Z888 Allergy status to other drugs, medicaments and biological substances status: Secondary | ICD-10-CM

## 2024-03-30 DIAGNOSIS — Z8701 Personal history of pneumonia (recurrent): Secondary | ICD-10-CM

## 2024-03-30 DIAGNOSIS — J449 Chronic obstructive pulmonary disease, unspecified: Secondary | ICD-10-CM | POA: Diagnosis present

## 2024-03-30 DIAGNOSIS — I5030 Unspecified diastolic (congestive) heart failure: Secondary | ICD-10-CM | POA: Diagnosis present

## 2024-03-30 DIAGNOSIS — R1310 Dysphagia, unspecified: Secondary | ICD-10-CM

## 2024-03-30 DIAGNOSIS — Z950 Presence of cardiac pacemaker: Secondary | ICD-10-CM

## 2024-03-30 DIAGNOSIS — Z90710 Acquired absence of both cervix and uterus: Secondary | ICD-10-CM

## 2024-03-30 DIAGNOSIS — Z9981 Dependence on supplemental oxygen: Secondary | ICD-10-CM

## 2024-03-30 DIAGNOSIS — R1313 Dysphagia, pharyngeal phase: Secondary | ICD-10-CM | POA: Diagnosis present

## 2024-03-30 DIAGNOSIS — J9611 Chronic respiratory failure with hypoxia: Secondary | ICD-10-CM | POA: Diagnosis present

## 2024-03-30 DIAGNOSIS — A419 Sepsis, unspecified organism: Secondary | ICD-10-CM | POA: Diagnosis present

## 2024-03-30 DIAGNOSIS — R6510 Systemic inflammatory response syndrome (SIRS) of non-infectious origin without acute organ dysfunction: Secondary | ICD-10-CM | POA: Diagnosis present

## 2024-03-30 DIAGNOSIS — Z886 Allergy status to analgesic agent status: Secondary | ICD-10-CM

## 2024-03-30 DIAGNOSIS — G9341 Metabolic encephalopathy: Secondary | ICD-10-CM | POA: Diagnosis present

## 2024-03-30 DIAGNOSIS — K219 Gastro-esophageal reflux disease without esophagitis: Secondary | ICD-10-CM | POA: Diagnosis present

## 2024-03-30 DIAGNOSIS — Z87891 Personal history of nicotine dependence: Secondary | ICD-10-CM

## 2024-03-30 DIAGNOSIS — E785 Hyperlipidemia, unspecified: Secondary | ICD-10-CM | POA: Diagnosis present

## 2024-03-30 DIAGNOSIS — I10 Essential (primary) hypertension: Secondary | ICD-10-CM | POA: Diagnosis present

## 2024-03-30 DIAGNOSIS — J439 Emphysema, unspecified: Secondary | ICD-10-CM | POA: Diagnosis present

## 2024-03-30 DIAGNOSIS — Z1152 Encounter for screening for COVID-19: Secondary | ICD-10-CM

## 2024-03-30 DIAGNOSIS — I495 Sick sinus syndrome: Secondary | ICD-10-CM | POA: Diagnosis present

## 2024-03-30 DIAGNOSIS — F419 Anxiety disorder, unspecified: Secondary | ICD-10-CM | POA: Diagnosis present

## 2024-03-30 DIAGNOSIS — R509 Fever, unspecified: Secondary | ICD-10-CM

## 2024-03-30 DIAGNOSIS — Z91013 Allergy to seafood: Secondary | ICD-10-CM

## 2024-03-30 LAB — CBC
HCT: 39 % (ref 36.0–46.0)
Hemoglobin: 12.2 g/dL (ref 12.0–15.0)
MCH: 29 pg (ref 26.0–34.0)
MCHC: 31.3 g/dL (ref 30.0–36.0)
MCV: 92.9 fL (ref 80.0–100.0)
Platelets: 227 K/uL (ref 150–400)
RBC: 4.2 MIL/uL (ref 3.87–5.11)
RDW: 13.8 % (ref 11.5–15.5)
WBC: 11.4 K/uL — ABNORMAL HIGH (ref 4.0–10.5)
nRBC: 0 % (ref 0.0–0.2)

## 2024-03-30 LAB — COMPREHENSIVE METABOLIC PANEL WITH GFR
ALT: 16 U/L (ref 0–44)
AST: 39 U/L (ref 15–41)
Albumin: 3.9 g/dL (ref 3.5–5.0)
Alkaline Phosphatase: 40 U/L (ref 38–126)
Anion gap: 15 (ref 5–15)
BUN: 33 mg/dL — ABNORMAL HIGH (ref 8–23)
CO2: 26 mmol/L (ref 22–32)
Calcium: 8.6 mg/dL — ABNORMAL LOW (ref 8.9–10.3)
Chloride: 99 mmol/L (ref 98–111)
Creatinine, Ser: 1.53 mg/dL — ABNORMAL HIGH (ref 0.44–1.00)
GFR, Estimated: 34 mL/min — ABNORMAL LOW (ref >60)
Glucose, Bld: 131 mg/dL — ABNORMAL HIGH (ref 70–99)
Potassium: 4 mmol/L (ref 3.5–5.1)
Sodium: 140 mmol/L (ref 135–145)
Total Bilirubin: 1.1 mg/dL (ref 0.0–1.2)
Total Protein: 5.7 g/dL — ABNORMAL LOW (ref 6.5–8.1)

## 2024-03-30 LAB — CBG MONITORING, ED: Glucose-Capillary: 125 mg/dL — ABNORMAL HIGH (ref 70–99)

## 2024-03-30 MED ORDER — LACTATED RINGERS IV BOLUS
1000.0000 mL | Freq: Once | INTRAVENOUS | Status: AC
  Administered 2024-03-31: 1000 mL via INTRAVENOUS

## 2024-03-30 NOTE — ED Triage Notes (Addendum)
 Pt was d/c from ED 4 days ago. Pt's sister states pt was at her baseline until 1930 this evening. Reports pt is extremely confused, but her oxygen saturation was 93% which is her baseline.  Pt oriented to person, place, situation, not oriented to time.

## 2024-03-30 NOTE — ED Notes (Signed)
 Pt attempted to use bathroom and missed the hat in the toiler

## 2024-03-31 ENCOUNTER — Emergency Department (HOSPITAL_COMMUNITY)

## 2024-03-31 DIAGNOSIS — J9611 Chronic respiratory failure with hypoxia: Secondary | ICD-10-CM

## 2024-03-31 DIAGNOSIS — E860 Dehydration: Secondary | ICD-10-CM | POA: Diagnosis present

## 2024-03-31 DIAGNOSIS — N179 Acute kidney failure, unspecified: Secondary | ICD-10-CM | POA: Diagnosis present

## 2024-03-31 DIAGNOSIS — K5090 Crohn's disease, unspecified, without complications: Secondary | ICD-10-CM

## 2024-03-31 DIAGNOSIS — I495 Sick sinus syndrome: Secondary | ICD-10-CM | POA: Diagnosis present

## 2024-03-31 DIAGNOSIS — M549 Dorsalgia, unspecified: Secondary | ICD-10-CM

## 2024-03-31 DIAGNOSIS — E785 Hyperlipidemia, unspecified: Secondary | ICD-10-CM

## 2024-03-31 DIAGNOSIS — I110 Hypertensive heart disease with heart failure: Secondary | ICD-10-CM | POA: Diagnosis not present

## 2024-03-31 DIAGNOSIS — N390 Urinary tract infection, site not specified: Secondary | ICD-10-CM | POA: Diagnosis not present

## 2024-03-31 DIAGNOSIS — G629 Polyneuropathy, unspecified: Secondary | ICD-10-CM

## 2024-03-31 DIAGNOSIS — K219 Gastro-esophageal reflux disease without esophagitis: Secondary | ICD-10-CM | POA: Diagnosis present

## 2024-03-31 DIAGNOSIS — Z7901 Long term (current) use of anticoagulants: Secondary | ICD-10-CM | POA: Diagnosis not present

## 2024-03-31 DIAGNOSIS — G8929 Other chronic pain: Secondary | ICD-10-CM

## 2024-03-31 DIAGNOSIS — J449 Chronic obstructive pulmonary disease, unspecified: Secondary | ICD-10-CM | POA: Diagnosis not present

## 2024-03-31 DIAGNOSIS — I2510 Atherosclerotic heart disease of native coronary artery without angina pectoris: Secondary | ICD-10-CM | POA: Diagnosis present

## 2024-03-31 DIAGNOSIS — Z950 Presence of cardiac pacemaker: Secondary | ICD-10-CM

## 2024-03-31 DIAGNOSIS — R1310 Dysphagia, unspecified: Secondary | ICD-10-CM

## 2024-03-31 DIAGNOSIS — R6520 Severe sepsis without septic shock: Secondary | ICD-10-CM

## 2024-03-31 DIAGNOSIS — A419 Sepsis, unspecified organism: Secondary | ICD-10-CM | POA: Diagnosis not present

## 2024-03-31 DIAGNOSIS — Z87891 Personal history of nicotine dependence: Secondary | ICD-10-CM | POA: Diagnosis not present

## 2024-03-31 DIAGNOSIS — G9341 Metabolic encephalopathy: Secondary | ICD-10-CM | POA: Diagnosis present

## 2024-03-31 DIAGNOSIS — I130 Hypertensive heart and chronic kidney disease with heart failure and stage 1 through stage 4 chronic kidney disease, or unspecified chronic kidney disease: Secondary | ICD-10-CM | POA: Diagnosis present

## 2024-03-31 DIAGNOSIS — J439 Emphysema, unspecified: Secondary | ICD-10-CM | POA: Diagnosis present

## 2024-03-31 DIAGNOSIS — R4182 Altered mental status, unspecified: Secondary | ICD-10-CM | POA: Diagnosis present

## 2024-03-31 DIAGNOSIS — Z1152 Encounter for screening for COVID-19: Secondary | ICD-10-CM | POA: Diagnosis not present

## 2024-03-31 DIAGNOSIS — Z79899 Other long term (current) drug therapy: Secondary | ICD-10-CM | POA: Diagnosis not present

## 2024-03-31 DIAGNOSIS — I5021 Acute systolic (congestive) heart failure: Secondary | ICD-10-CM | POA: Diagnosis not present

## 2024-03-31 DIAGNOSIS — I10 Essential (primary) hypertension: Secondary | ICD-10-CM

## 2024-03-31 DIAGNOSIS — Z8701 Personal history of pneumonia (recurrent): Secondary | ICD-10-CM | POA: Diagnosis not present

## 2024-03-31 DIAGNOSIS — E872 Acidosis, unspecified: Secondary | ICD-10-CM | POA: Diagnosis present

## 2024-03-31 DIAGNOSIS — R6510 Systemic inflammatory response syndrome (SIRS) of non-infectious origin without acute organ dysfunction: Secondary | ICD-10-CM | POA: Diagnosis present

## 2024-03-31 DIAGNOSIS — G9340 Encephalopathy, unspecified: Secondary | ICD-10-CM | POA: Diagnosis not present

## 2024-03-31 DIAGNOSIS — F419 Anxiety disorder, unspecified: Secondary | ICD-10-CM

## 2024-03-31 DIAGNOSIS — I5030 Unspecified diastolic (congestive) heart failure: Secondary | ICD-10-CM | POA: Diagnosis not present

## 2024-03-31 DIAGNOSIS — I5033 Acute on chronic diastolic (congestive) heart failure: Secondary | ICD-10-CM | POA: Diagnosis not present

## 2024-03-31 DIAGNOSIS — I5032 Chronic diastolic (congestive) heart failure: Secondary | ICD-10-CM

## 2024-03-31 DIAGNOSIS — Z7951 Long term (current) use of inhaled steroids: Secondary | ICD-10-CM | POA: Diagnosis not present

## 2024-03-31 DIAGNOSIS — N1831 Chronic kidney disease, stage 3a: Secondary | ICD-10-CM | POA: Diagnosis present

## 2024-03-31 LAB — URINALYSIS, ROUTINE W REFLEX MICROSCOPIC
Bacteria, UA: NONE SEEN
Bilirubin Urine: NEGATIVE
Glucose, UA: NEGATIVE mg/dL
Hgb urine dipstick: NEGATIVE
Ketones, ur: NEGATIVE mg/dL
Nitrite: NEGATIVE
Protein, ur: NEGATIVE mg/dL
Specific Gravity, Urine: 1.035 — ABNORMAL HIGH (ref 1.005–1.030)
pH: 5 (ref 5.0–8.0)

## 2024-03-31 LAB — RESP PANEL BY RT-PCR (RSV, FLU A&B, COVID)  RVPGX2
Influenza A by PCR: NEGATIVE
Influenza B by PCR: NEGATIVE
Resp Syncytial Virus by PCR: NEGATIVE
SARS Coronavirus 2 by RT PCR: NEGATIVE

## 2024-03-31 LAB — MAGNESIUM: Magnesium: 1.2 mg/dL — ABNORMAL LOW (ref 1.7–2.4)

## 2024-03-31 LAB — PROCALCITONIN: Procalcitonin: <0.1 ng/mL

## 2024-03-31 LAB — TROPONIN I (HIGH SENSITIVITY)
Troponin I (High Sensitivity): 141 ng/L (ref <18)
Troponin I (High Sensitivity): 109 ng/L (ref <18)

## 2024-03-31 LAB — LACTIC ACID, PLASMA: Lactic Acid, Venous: 2.3 mmol/L (ref 0.5–1.9)

## 2024-03-31 LAB — TSH: TSH: 1.771 u[IU]/mL (ref 0.350–4.500)

## 2024-03-31 MED ORDER — SODIUM CHLORIDE 0.9 % IV SOLN
2.0000 g | INTRAVENOUS | Status: DC
  Administered 2024-03-31: 2 g via INTRAVENOUS
  Filled 2024-03-31: qty 12.5

## 2024-03-31 MED ORDER — SPIRONOLACTONE 12.5 MG HALF TABLET
12.5000 mg | ORAL_TABLET | Freq: Every day | ORAL | Status: DC
  Administered 2024-03-31 – 2024-04-03 (×4): 12.5 mg via ORAL
  Filled 2024-03-31 (×4): qty 1

## 2024-03-31 MED ORDER — BUDESON-GLYCOPYRROL-FORMOTEROL 160-9-4.8 MCG/ACT IN AERO
2.0000 | INHALATION_SPRAY | Freq: Two times a day (BID) | RESPIRATORY_TRACT | Status: DC
  Administered 2024-03-31 – 2024-04-03 (×5): 2 via RESPIRATORY_TRACT
  Filled 2024-03-31: qty 5.9

## 2024-03-31 MED ORDER — OXYCODONE HCL 5 MG PO TABS
5.0000 mg | ORAL_TABLET | Freq: Four times a day (QID) | ORAL | Status: DC | PRN
  Administered 2024-03-31 – 2024-04-03 (×10): 5 mg via ORAL
  Filled 2024-03-31 (×12): qty 1

## 2024-03-31 MED ORDER — STARCH-MALTODEXTRIN PO POWD: ORAL | Status: DC | PRN

## 2024-03-31 MED ORDER — APIXABAN 5 MG PO TABS
5.0000 mg | ORAL_TABLET | Freq: Two times a day (BID) | ORAL | Status: DC
  Administered 2024-03-31 – 2024-04-03 (×7): 5 mg via ORAL
  Filled 2024-03-31 (×7): qty 1

## 2024-03-31 MED ORDER — MAGNESIUM SULFATE 2 GM/50ML IV SOLN
2.0000 g | Freq: Once | INTRAVENOUS | Status: AC
  Administered 2024-03-31: 2 g via INTRAVENOUS
  Filled 2024-03-31: qty 50

## 2024-03-31 MED ORDER — ALPRAZOLAM 0.5 MG PO TABS
1.0000 mg | ORAL_TABLET | Freq: Two times a day (BID) | ORAL | Status: DC | PRN
  Administered 2024-03-31 – 2024-04-03 (×7): 1 mg via ORAL
  Filled 2024-03-31 (×7): qty 2

## 2024-03-31 MED ORDER — VANCOMYCIN HCL IN DEXTROSE 1-5 GM/200ML-% IV SOLN: 1000.0000 mg | INTRAVENOUS | Status: DC

## 2024-03-31 MED ORDER — RISAQUAD PO CAPS
1.0000 | ORAL_CAPSULE | Freq: Three times a day (TID) | ORAL | Status: DC
  Administered 2024-03-31 – 2024-04-03 (×9): 1 via ORAL
  Filled 2024-03-31 (×9): qty 1

## 2024-03-31 MED ORDER — ATENOLOL 50 MG PO TABS
25.0000 mg | ORAL_TABLET | Freq: Every day | ORAL | Status: DC
  Administered 2024-03-31 – 2024-04-03 (×4): 25 mg via ORAL
  Filled 2024-03-31 (×4): qty 1

## 2024-03-31 MED ORDER — PREGABALIN 25 MG PO CAPS
50.0000 mg | ORAL_CAPSULE | Freq: Two times a day (BID) | ORAL | Status: DC
  Administered 2024-03-31 – 2024-04-03 (×7): 50 mg via ORAL
  Filled 2024-03-31 (×7): qty 2

## 2024-03-31 MED ORDER — SODIUM CHLORIDE 0.9 % IV SOLN
2.0000 g | Freq: Once | INTRAVENOUS | Status: AC
  Administered 2024-03-31: 2 g via INTRAVENOUS
  Filled 2024-03-31: qty 12.5

## 2024-03-31 MED ORDER — SODIUM CHLORIDE 0.9 % IV SOLN: INTRAVENOUS | Status: DC

## 2024-03-31 MED ORDER — SODIUM CHLORIDE 0.9% FLUSH
3.0000 mL | Freq: Two times a day (BID) | INTRAVENOUS | Status: DC
Start: 2024-03-31 — End: 2024-04-03
  Administered 2024-03-31 – 2024-04-03 (×7): 3 mL via INTRAVENOUS

## 2024-03-31 MED ORDER — ALBUTEROL SULFATE (2.5 MG/3ML) 0.083% IN NEBU: 2.5000 mg | INHALATION_SOLUTION | Freq: Four times a day (QID) | RESPIRATORY_TRACT | Status: DC | PRN

## 2024-03-31 MED ORDER — ACETAMINOPHEN 325 MG PO TABS
650.0000 mg | ORAL_TABLET | Freq: Once | ORAL | Status: AC
  Administered 2024-03-31: 650 mg via ORAL
  Filled 2024-03-31: qty 2

## 2024-03-31 MED ORDER — FENOFIBRATE 160 MG PO TABS
160.0000 mg | ORAL_TABLET | Freq: Every day | ORAL | Status: DC
  Administered 2024-03-31 – 2024-04-03 (×4): 160 mg via ORAL
  Filled 2024-03-31 (×4): qty 1

## 2024-03-31 MED ORDER — PHENOL 1.4 % MT LIQD
1.0000 | OROMUCOSAL | Status: DC | PRN
  Administered 2024-03-31: 1 via OROMUCOSAL
  Filled 2024-03-31: qty 177

## 2024-03-31 MED ORDER — VANCOMYCIN HCL 1500 MG/300ML IV SOLN
1500.0000 mg | Freq: Once | INTRAVENOUS | Status: AC
  Administered 2024-03-31: 1500 mg via INTRAVENOUS
  Filled 2024-03-31: qty 300

## 2024-03-31 MED ORDER — PANTOPRAZOLE SODIUM 40 MG PO TBEC
40.0000 mg | DELAYED_RELEASE_TABLET | Freq: Every day | ORAL | Status: DC
  Administered 2024-03-31 – 2024-04-03 (×4): 40 mg via ORAL
  Filled 2024-03-31 (×4): qty 1

## 2024-03-31 MED ORDER — EZETIMIBE 10 MG PO TABS
10.0000 mg | ORAL_TABLET | Freq: Every day | ORAL | Status: DC
  Administered 2024-03-31 – 2024-04-02 (×3): 10 mg via ORAL
  Filled 2024-03-31 (×3): qty 1

## 2024-03-31 MED ORDER — ACETAMINOPHEN 650 MG RE SUPP: 650.0000 mg | Freq: Four times a day (QID) | RECTAL | Status: DC | PRN

## 2024-03-31 MED ORDER — DICLOFENAC SODIUM 1 % EX GEL
1.0000 | Freq: Two times a day (BID) | CUTANEOUS | Status: DC
  Administered 2024-03-31 – 2024-04-03 (×7): 1 via TOPICAL
  Filled 2024-03-31: qty 100

## 2024-03-31 MED ORDER — IOHEXOL 350 MG/ML SOLN
75.0000 mL | Freq: Once | INTRAVENOUS | Status: AC | PRN
  Administered 2024-03-31: 75 mL via INTRAVENOUS

## 2024-03-31 MED ORDER — POTASSIUM CHLORIDE 10 MEQ/100ML IV SOLN
10.0000 meq | Freq: Once | INTRAVENOUS | Status: AC
  Administered 2024-03-31: 10 meq via INTRAVENOUS
  Filled 2024-03-31: qty 100

## 2024-03-31 MED ORDER — ACETAMINOPHEN 325 MG PO TABS
650.0000 mg | ORAL_TABLET | Freq: Four times a day (QID) | ORAL | Status: DC | PRN
  Administered 2024-04-01 – 2024-04-02 (×3): 650 mg via ORAL
  Filled 2024-03-31 (×3): qty 2

## 2024-03-31 MED ORDER — SODIUM CHLORIDE 0.9% FLUSH: 10.0000 mL | INTRAVENOUS | Status: DC | PRN

## 2024-03-31 NOTE — Progress Notes (Signed)
 Pharmacy Antibiotic Note  Brittany Irwin is a 83 y.o. female admitted on 03/30/2024 with sepsis.  Pharmacy has been consulted for Vancomycin  and Cefepime  dosing. Recent admission 9/3-9/7 and treated for asp pna with Cefepime ->doxy at discharge. Pt received loading doses in ED.  Plan: Cefepime  2gm IV q24h Vancomycin  1000 mg IV Q 36 hrs. Goal AUC 400-550. Expected AUC: 500 SCr used: 1.53 Will f/u renal function, micro data, and pt's clinical condition Vanc levels prn F/u MRSA PCR     Temp (24hrs), Avg:98.8 F (37.1 C), Min:97.4 F (36.3 C), Max:101.6 F (38.7 C)  Recent Labs  Lab 03/30/24 2218 03/31/24 0026  WBC 11.4*  --   CREATININE 1.53*  --   LATICACIDVEN  --  2.3*    Estimated Creatinine Clearance: 28.4 mL/min (A) (by C-G formula based on SCr of 1.53 mg/dL (H)).    Allergies  Allergen Reactions   Methylprednisolone  Other (See Comments)    Increased BP, HR, agitation, hallucinations    Shellfish Allergy Nausea And Vomiting    All SEAFOOD   Ativan  [Lorazepam ] Other (See Comments)    Psychosis   Azulfidine [Sulfasalazine] Other (See Comments)    Headache    Epipen [Epinephrine] Hypertension   Flagyl [Metronidazole] Hives   Motrin [Ibuprofen] Nausea Only and Other (See Comments)    Told not to take medication due to GI distress caused by crohn's disease.   Nsaids Other (See Comments)    Told not to take NSAIDs due to GI distress caused by crohn's disease   Penicillins Hives    12/2021, 11/2023 tolerated cephalosporins    Morphine And Codeine  Itching    Told not to take due to GI distress caused by crohn's disease    Antimicrobials this admission: 9/12 Vanc >>  9/12 Cefepime  >>   Microbiology results: 9/12 BCx:   UCx:   Thank you for allowing pharmacy to be a part of this patient's care.  Vito Ralph, PharmD, BCPS Please see amion for complete clinical pharmacist phone list 03/31/2024 10:44 AM

## 2024-03-31 NOTE — H&P (Addendum)
 History and Physical    Patient: Brittany Irwin FMW:[id number] DOB: 03-14-1941 DOA: 03/30/2024 DOS: the patient was seen and examined on 03/31/2024 PCP: [name], MD  Patient coming from: Home  Chief Complaint:  Chief Complaint  Patient presents with   Altered Mental Status   HPI: Brittany Irwin is a 83 y.o. female with medical history significant of COPD, chronic respiratory failure on 4 L, HfpEF, CAD, tachybradycardia syndrome s/p permanent pacemaker, Crohn's disease, SBO, psoriasis, migraine headaches, CKD stage III, and anxiety who presents after being noted to be acutely altered with fever.  Patient with recent admissions to the hospital 9/3 through 9/7 with concern for sepsis secondary to a aspiration pneumonia.  Hospitalized from 8/11-8/17 with acute on chronic respiratory failure with sepsis secondary to aspiration pneumonia as well as concerns for acute on chronic diastolic CHF.  Yesterday, patient seemed to be in her normal state of health at around 3:30 PM when talked to her sister last.  However later on that evening after her sister got back home from being out noted that her sister had texted her multiple times and the text did not make sense.  She reports that she has a baseline tremor but normally is able to recognize what she is trying to stay.  She went to go check on her because she had also called and said come here and when she talked to her on the phone she said she was looking for [name] her sister although she was talking to her.  When her sister went to go check on her she reported that the patient seemed to be getting more and more agitated with her and although her oxygenation was stable on her home oxygen she was noted to have a low-grade temperature for which she ultimately called EMS.  Patient reports that she has been having difficulty with the thickener as there was concern that she was aspirating. With the patient's Crohn's disease she reports that she  has been having more frequent diarrhea on the thickened liquids.  Despite this the patient's had continued the thickened liquids.  She did not  In the ED patient was noted to be febrile up to 101.6 F with respirations 11-26, blood pressures 92/56 -126/70, and O2 saturations maintained on room air.  Labs significant for WBC 11.4, BUN 33, creatinine 1.53, magnesium  1.2, high-sensitivity troponin 141-> 109, procalcitonin less than 0.1, and lactic acid 2.3.  CT scan of the head did not reveal any acute abnormality CT scan of the chest abdomen pelvis was obtained which revealed no acute findings and emphysema with findings of chronic airway inflammation. Urinalysis noted moderate leukocytes, no bacteria seen, and 11-20 WBCs.  Patient had been given 1 L of lactated Ringer 's, acetaminophen  650 mg p.o., vancomycin , cefepime , and 2 g magnesium  sulfate IV.   Review of Systems: As mentioned in the history of present illness. All other systems reviewed and are negative. Past Medical History:  Diagnosis Date   Acute CHF (congestive heart failure) (HCC) 01/02/2022   AKI (acute kidney injury) (HCC) 09/08/2020   Aspiration pneumonia (HCC) 01/05/2019   Atypical chest pain 03/11/2022   Bradycardia 03/14/2020   C. difficile colitis 04/03/2021   CHF (congestive heart failure) (HCC)    COPD (chronic obstructive pulmonary disease) (HCC)    Coronary artery disease    Tachycardia 05/31/2013   Past Surgical History:  Procedure Laterality Date   ABDOMINAL HYSTERECTOMY     BOWEL RESECTION     CHOLECYSTECTOMY  Social History:  reports that she has quit smoking. Her smoking use included cigarettes. She does not have any smokeless tobacco history on file. She reports that she does not currently use alcohol . She reports that she does not use drugs.  Allergies  Allergen Reactions   Methylprednisolone  Other (See Comments)    Increased BP, HR, agitation, hallucinations    Shellfish Allergy Nausea And Vomiting     All SEAFOOD   Ativan  [Lorazepam ] Other (See Comments)    Psychosis   Azulfidine [Sulfasalazine] Other (See Comments)    Headache    Epipen [Epinephrine] Hypertension   Flagyl [Metronidazole] Hives   Motrin [Ibuprofen] Nausea Only and Other (See Comments)    Told not to take medication due to GI distress caused by crohn's disease.   Nsaids Other (See Comments)    Told not to take NSAIDs due to GI distress caused by crohn's disease   Penicillins Hives    12/2021, 11/2023 tolerated cephalosporins    Morphine And Codeine  Itching    Told not to take due to GI distress caused by crohn's disease    History reviewed. No pertinent family history.  Prior to Admission medications   Medication Sig Start Date End Date Taking? Authorizing Provider  acidophilus (RISAQUAD) CAPS capsule Take 1 capsule by mouth 3 (three) times daily. 03/26/24  Yes Cherlyn Labella, MD  ALPRAZolam  (XANAX ) 1 MG tablet Take 1 mg by mouth 2 (two) times daily. 01/02/22  Yes [provider]  apixaban  (ELIQUIS ) 5 MG TABS tablet Take 5 mg by mouth 2 (two) times daily.   Yes [provider]  atenolol  (TENORMIN ) 25 MG tablet Take 1 tablet (25 mg total) by mouth daily. 02/16/24  Yes Sheikh, Omair Latif, DO  cyanocobalamin (,VITAMIN B-12,) 1000 MCG/ML injection Inject 1,000 mcg into the muscle every 30 (thirty) days.   Yes [provider]  cyclobenzaprine  (FLEXERIL ) 5 MG tablet Take 5 mg by mouth daily as needed for muscle spasms. 10/13/22  Yes [provider]  diclofenac  Sodium (VOLTAREN  ARTHRITIS PAIN) 1 % GEL Apply 1 Application topically in the morning and at bedtime.   Yes [provider]  ergocalciferol  (VITAMIN D2) 1.25 MG (50000 UT) capsule Take 50,000 Units by mouth once a week. Every thursday   Yes [provider]  ezetimibe  (ZETIA ) 10 MG tablet Take 10 mg by mouth at bedtime. 12/31/21  Yes [provider]  fenofibrate  micronized (LOFIBRA) 200 MG capsule Take 200 mg by  mouth daily. 12/31/21  Yes [provider]  Fluticasone -Umeclidin-Vilant (TRELEGY ELLIPTA ) 200-62.5-25 MCG/ACT AEPB Inhale 1 puff into the lungs daily. 12/22/23  Yes   furosemide  (LASIX ) 40 MG tablet Take 1 tablet (40 mg total) by mouth daily. 03/05/24 03/05/25 Yes Singh, Prashant K, MD  Menthol, Topical Analgesic, (ICY HOT BACK EX) Apply 1 Application topically 2 (two) times daily as needed (for neuropathic pain in feet).   Yes [provider]  omeprazole (PRILOSEC) 40 MG capsule Take 40 mg by mouth daily.   Yes [provider]  oxyCODONE  (OXY IR/ROXICODONE ) 5 MG immediate release tablet Take 1 tablet (5 mg total) by mouth every 6 (six) hours as needed for up to 5 days for moderate pain (pain score 4-6). 03/26/24 03/31/24 Yes Akula, Vijaya, MD  pregabalin  (LYRICA ) 50 MG capsule Take 50 mg by mouth 2 (two) times daily.   Yes [provider]  spironolactone  (ALDACTONE ) 25 MG tablet Take 0.5 tablets (12.5 mg total) by mouth daily. 03/05/24 04/04/24 Yes Dennise,  Prashant K, MD  STARCH-MALTO DEXTRIN (THICK-IT) POWD Make liquids nectar thick. Kindly dispense any thickening product to make liquids nectar thick consistency, 1 month supply no refills. 03/05/24  Yes Dennise Lavada POUR, MD  levalbuterol  (XOPENEX ) 0.63 MG/3ML nebulizer solution Take 3 mLs (0.63 mg total) by nebulization every 4 (four) hours as needed for wheezing or shortness of breath. Patient not taking: Reported on 03/31/2024 09/11/23 03/22/24  Christobal Guadalajara, MD  nitroGLYCERIN  (NITROSTAT ) 0.4 MG SL tablet Place 0.4 mg under the tongue every 5 (five) minutes x 3 doses as needed for chest pain. Patient not taking: Reported on 03/31/2024 12/31/21   [provider]    Physical Exam: Vitals:   03/31/24 0915 03/31/24 0930 03/31/24 0945 03/31/24 1000  BP: (!) 115/59 (!) 114/54 (!) 110/52 (!) 111/54  Pulse: (!) 59 60 (!) 59 60  Resp: (!) 26 16 15 13   Temp:      TempSrc:      SpO2: 96% 96% 96% 95%     Constitutional:  Elderly female who is able to follow commands. Eyes: PERRL, lids and conjunctivae normal ENMT: Mucous membranes are moist. Normal dentition.  Neck: normal, supple,  Respiratory: clear to auscultation bilaterally, no wheezing, no crackles. Normal respiratory effort. No accessory muscle use.  Cardiovascular: Regular rate and rhythm, no murmurs / rubs / gallops. No extremity edema. 2+ pedal pulses. No carotid bruits.  Abdomen: no tenderness, no masses palpated. No hepatosplenomegaly. Bowel sounds positive.  Musculoskeletal: no clubbing / cyanosis. No joint deformity upper and lower extremities. Good ROM, no contractures. Normal muscle tone.  Skin: no rashes, lesions, ulcers. No induration Neurologic: CN 2-12 grossly intact.  Strength 5/5 in all 4.  Psychiatric: Patient appears to be intermittently confused with repetitive train of thought.  Alert and oriented to person and place  Data Reviewed:  EKG showed  atrial -ventricular dual paced rhythm at 75 bpm reviewed, imaging, and pertinent records as documented.  Assessment and Plan:  SIRS/sepsis, unknown cause Possible urinary tract infection Patient recently febrile to 101.9 F with tachypnea meeting SIRS criteria.  Labs significant for WBC 11.4 and lactic acid 2.3.  Chest x-ray noted minimal bibasilar atelectasis.  Procalcitonin was noted to be less than 0.1.  Report diarrhea. Urinalysis noted moderate leukocytes with no bacteria seen and 11-20 WBCs.  Blood and urine cultures were obtained.  Given recent history of sepsis thought secondary to pneumonia patient was placed on empiric antibiotics of vancomycin  and cefepime . - Admit to a telemetry bed - Follow-up blood and urine cultures - Check for C. difficile given recent history of antibiotics and reports of diarrhea - Continue empiric antibiotics of vancomycin  and cefepime  - Tylenol  as needed for fever  Acute metabolic encephalopathy Patient was noted to be more confused with reports of  agitation by her sister which usually occurs when she has been sick with an infection.  [place] and is able to care for herself at baseline.  However during acute incidences where she has no fever or illness confusion worsens.  CT scan of the head did not reveal any acute abnormality.  - Delirium precautions  Acute kidney injury Creatinine noted to be 1.53 with BUN 33 baseline creatinine previously noted to be around 1.  This may be related to patient being on thickened liquids and diuretics.   - Gentle IV fluids at 75 mL/h.  Discontinue early due to concern for possible fluid overload - Recheck kidney function in a.m.  Pharyngeal dysphagia During  her prior hospitalization patient was noted to have concern for aspiration for which she was recommended to be on heart healthy diet with thicken liquids - Continue thickened liquids  Hypomagnesemia Acute. Magnesium  level noted to be 1.2.  Patient had been given 2 g of magnesium  sulfate IV. - Continue magnesium  sulfate as needed  COPD Chronic respiratory failure with hypoxia At baseline patient is on 4 L of nasal cannula oxygen.  On physical exam no significant wheezes or rhonchi appreciated. - Continue nasal cannula oxygen at 4 L - Continue home inhaler   Heart failure with preserved ejection fraction Patient appears to be euvolemic on physical exam.  Last echocardiogram noted EF to be 60 to 65% with indeterminate diastolic parameters when checked on 03/04/2024. - Strict I&Os and daily weights - Determine when medically appropriate to resume diuretics  Essential hypertension - Continue atenolol   Chronic back pain Patient has chronic lower back pain 2/2  chronic spinal stenosis of her spine for which she is on chronic pain medication.  She is on Tylenol  for outpatient setting receiving over 12 - Continue Lyrica  and oxycodone  as needed for pain. - Continue outpatient follow-up with  orthopedics sister notes possible nerve block  Tachybradycardia syndrome s/p PPM  Crohn's disease Patient reports intermittently having diarrhea, but denies having any blood present. - Check C. difficile colitis if noted to have diarrhea    History of small bowel obstruction No signs of bowel obstruction noted at this time  Anxiety - Continue Xanax  as needed   Neuropathy - Continue gabapentin  and Lyrica   Hyperlipidemia - Continue fenofibrate  and Zetia   GERD - Continue PPI   DVT prophylaxis: Lovenox  Advance Care Planning:   Code Status: Full Code    Consults: None  Family Communication: Patient sister updated over the phone  Severity of Illness: The appropriate patient status for this patient is INPATIENT. Inpatient status is judged to be reasonable and necessary in order to provide the required intensity of service to ensure the patient's safety. The patient's presenting symptoms, physical exam findings, and initial radiographic and laboratory data in the context of their chronic comorbidities is felt to place them at high risk for further clinical deterioration. Furthermore, it is not anticipated that the patient will be medically stable for discharge from the hospital within 2 midnights of admission.   * I certify that at the point of admission it is my clinical judgment that the patient will require inpatient hospital care spanning beyond 2 midnights from the point of admission due to high intensity of service, high risk for further deterioration and high frequency of surveillance required.*  Author: Maximino DELENA Sharps, MD 03/31/2024 10:07 AM  For on call review [email or web address].

## 2024-03-31 NOTE — ED Provider Notes (Signed)
 Emergency Department Provider Note  TRIAGE NOTE: Pt was d/c from ED 4 days ago. Pt's sister states pt was at her baseline until 1930 this evening. Reports pt is extremely confused, but her oxygen saturation was 93% which is her baseline.  Pt oriented to person, place, situation, not oriented to time.    HISTORY  Chief Complaint Altered Mental Status   HPI Brittany Irwin is a 83 y.o. female with  a history of pneumonia, spinal stenosis, degenerative disc disease, and neuropathy, who presents with confusion and a low-grade fever. She reports that her temperature initially rose to 101F before decreasing to 99.59F. The patient has experienced similar episodes of confusion in the past, often associated with fever and low oxygen levels. She has been hospitalized multiple times this year for pneumonia and has been on antibiotics during these admissions. The patient also reports a recent fall in the hospital, which was non-injurious, and a history of fluid overload. She has a pacemaker and has been advised to thicken liquids due to swallowing difficulties leading to aspiration and recurrent pneumonia. The patient denies current use of doxycycline , having completed the course on Tuesday. She expresses concern about her mental state during febrile episodes and the recurrent nature of her pneumonia. History was obtained from the patient and her sister, [name].  PMH Past Medical History:  Diagnosis Date   Acute CHF (congestive heart failure) (HCC) 01/02/2022   AKI (acute kidney injury) (HCC) 09/08/2020   Aspiration pneumonia (HCC) 01/05/2019   Atypical chest pain 03/11/2022   Bradycardia 03/14/2020   C. difficile colitis 04/03/2021   CHF (congestive heart failure) (HCC)    COPD (chronic obstructive pulmonary disease) (HCC)    Coronary artery disease    Tachycardia 05/31/2013    Home Medications Prior to Admission medications   Medication Sig Start Date End Date Taking? Authorizing Provider   acidophilus (RISAQUAD) CAPS capsule Take 1 capsule by mouth 3 (three) times daily. 03/26/24  Yes Cherlyn Labella, MD  ALPRAZolam  (XANAX ) 1 MG tablet Take 1 mg by mouth 2 (two) times daily. 01/02/22  Yes [provider]  apixaban  (ELIQUIS ) 5 MG TABS tablet Take 5 mg by mouth 2 (two) times daily.   Yes [provider]  atenolol  (TENORMIN ) 25 MG tablet Take 1 tablet (25 mg total) by mouth daily. 02/16/24  Yes Sheikh, Omair Latif, DO  cyanocobalamin (,VITAMIN B-12,) 1000 MCG/ML injection Inject 1,000 mcg into the muscle every 30 (thirty) days.   Yes [provider]  cyclobenzaprine  (FLEXERIL ) 5 MG tablet Take 5 mg by mouth daily as needed for muscle spasms. 10/13/22  Yes [provider]  diclofenac  Sodium (VOLTAREN  ARTHRITIS PAIN) 1 % GEL Apply 1 Application topically in the morning and at bedtime.   Yes [provider]  ergocalciferol  (VITAMIN D2) 1.25 MG (50000 UT) capsule Take 50,000 Units by mouth once a week. Every thursday   Yes [provider]  ezetimibe  (ZETIA ) 10 MG tablet Take 10 mg by mouth at bedtime. 12/31/21  Yes [provider]  fenofibrate  micronized (LOFIBRA) 200 MG capsule Take 200 mg by mouth daily. 12/31/21  Yes [provider]  Fluticasone -Umeclidin-Vilant (TRELEGY ELLIPTA ) 200-62.5-25 MCG/ACT AEPB Inhale 1 puff into the lungs daily. 12/22/23  Yes   furosemide  (LASIX ) 40 MG tablet Take 1 tablet (40 mg total) by mouth daily. 03/05/24 03/05/25 Yes Singh, Prashant K, MD  Menthol, Topical Analgesic, (ICY HOT BACK EX) Apply 1 Application topically 2 (two) times daily as needed (for neuropathic  pain in feet).   Yes [provider]  omeprazole (PRILOSEC) 40 MG capsule Take 40 mg by mouth daily.   Yes [provider]  oxyCODONE  (OXY IR/ROXICODONE ) 5 MG immediate release tablet Take 1 tablet (5 mg total) by mouth every 6 (six) hours as needed for up to 5 days for moderate pain (pain score 4-6). 03/26/24 03/31/24 Yes  Akula, Vijaya, MD  pregabalin  (LYRICA ) 50 MG capsule Take 50 mg by mouth 2 (two) times daily.   Yes [provider]  spironolactone  (ALDACTONE ) 25 MG tablet Take 0.5 tablets (12.5 mg total) by mouth daily. 03/05/24 04/04/24 Yes Dennise Lavada POUR, MD  [facility] DEXTRIN [facility]) POWD Make liquids nectar thick. Kindly dispense any thickening product to make liquids nectar thick consistency, 1 month supply no refills. 03/05/24  Yes Dennise Lavada POUR, MD  levalbuterol  (XOPENEX ) 0.63 MG/3ML nebulizer solution Take 3 mLs (0.63 mg total) by nebulization every 4 (four) hours as needed for wheezing or shortness of breath. Patient not taking: Reported on 03/31/2024 09/11/23 03/22/24  Christobal Guadalajara, MD  nitroGLYCERIN  (NITROSTAT ) 0.4 MG SL tablet Place 0.4 mg under the tongue every 5 (five) minutes x 3 doses as needed for chest pain. Patient not taking: Reported on 03/31/2024 12/31/21   [provider]    Social History Social History   Tobacco Use   Smoking status: Former    Types: Cigarettes  Substance Use Topics   Alcohol  use: Not Currently   Drug use: Never    Review of Systems: Documented in HPI ____________________________________________  PHYSICAL EXAM: VITAL SIGNS: Triage: Blood pressure 112/79, pulse 61, temperature 98.1 F (36.7 C), temperature source Oral, resp. rate 15, SpO2 97%.  Vitals:   03/31/24 0200 03/31/24 0345 03/31/24 0500 03/31/24 0548  BP: (!) 98/57 (!) 100/52 112/79   Pulse: 64 60 61   Resp: 17 14 15    Temp:    98.1 F (36.7 C)  TempSrc:    Oral  SpO2: 91% 94% 97%     Physical Exam Vitals and nursing note reviewed.  Constitutional:      Appearance: She is well-developed.  HENT:     Head: Normocephalic and atraumatic.  Cardiovascular:     Rate and Rhythm: Normal rate and regular rhythm.  Pulmonary:     Effort: No respiratory distress.     Breath sounds: No stridor.  Abdominal:     General: There is no distension.  Musculoskeletal:     Cervical  back: Normal range of motion.  Neurological:     Mental Status: She is alert.       ____________________________________________   LABS (all labs ordered are listed, but only abnormal results are displayed)  Labs Reviewed  COMPREHENSIVE METABOLIC PANEL WITH GFR - Abnormal; Notable for the following components:      Result Value   Glucose, Bld 131 (*)    BUN 33 (*)    Creatinine, Ser 1.53 (*)    Calcium 8.6 (*)    Total Protein 5.7 (*)    GFR, Estimated 34 (*)    All other components within normal limits  CBC - Abnormal; Notable for the following components:   WBC 11.4 (*)    All other components within normal limits  URINALYSIS, ROUTINE W REFLEX MICROSCOPIC - Abnormal; Notable for the following components:   Specific Gravity, Urine 1.035 (*)    Leukocytes,Ua MODERATE (*)    All other components within normal limits  MAGNESIUM  - Abnormal; Notable for the following components:  Magnesium  1.2 (*)    All other components within normal limits  LACTIC ACID, PLASMA - Abnormal; Notable for the following components:   Lactic Acid, Venous 2.3 (*)    All other components within normal limits  CBG MONITORING, ED - Abnormal; Notable for the following components:   Glucose-Capillary 125 (*)    All other components within normal limits  TROPONIN I (HIGH SENSITIVITY) - Abnormal; Notable for the following components:   Troponin I (High Sensitivity) 141 (*)    All other components within normal limits  TROPONIN I (HIGH SENSITIVITY) - Abnormal; Notable for the following components:   Troponin I (High Sensitivity) 109 (*)    All other components within normal limits  CULTURE, BLOOD (ROUTINE X 2)  CULTURE, BLOOD (ROUTINE X 2)  PROCALCITONIN   ____________________________________________  EKG   EKG Interpretation Date/Time:  Thursday March 30 2024 23:40:52 EDT Ventricular Rate:  75 PR Interval:  84 QRS Duration:  150 QT Interval:  451 QTC Calculation: 504 R  Axis:   230  Text Interpretation: Atrial-ventricular dual-paced rhythm No further analysis attempted due to paced rhythm Confirmed by Lorette Mayo [phone]) on 03/31/2024 2:55:01 AM        ____________________________________________  RADIOLOGY  CT Head Wo Contrast Result Date: 03/31/2024 EXAM: CT HEAD WITHOUT CONTRAST 03/31/2024 02:22:51 AM TECHNIQUE: CT of the head was performed without the administration of intravenous contrast. Automated exposure control, iterative reconstruction, and/or weight based adjustment of the mA/kV was utilized to reduce the radiation dose to as low as reasonably achievable. COMPARISON: 02/28/2024 CLINICAL HISTORY: Mental status change, unknown cause. Mental status change; Abdominal pain, acute, nonlocalized, Cough, chronic/persisting > 8 weeks, failed empiric treatment, abdominal pain, acute, nonlocalized. FINDINGS: BRAIN AND VENTRICLES: No acute hemorrhage. No evidence of acute infarct. No hydrocephalus. No extra-axial collection. No mass effect or midline shift. ORBITS: No acute abnormality. SINUSES: No acute abnormality. SOFT TISSUES AND SKULL: No acute soft tissue abnormality. No skull fracture. IMPRESSION: 1. No acute intracranial abnormality. Electronically signed by: Norman Gatlin MD 03/31/2024 02:38 AM EDT RP Workstation: [id number]   CT CHEST ABDOMEN PELVIS W CONTRAST Result Date: 03/31/2024 EXAM: CT CHEST, ABDOMEN AND PELVIS WITH CONTRAST 03/31/2024 02:22:51 AM TECHNIQUE: CT of the chest, abdomen and pelvis was performed with the administration of intravenous contrast (75mL iohexol  (OMNIPAQUE ) 350 MG/ML injection). Multiplanar reformatted images are provided for review. Automated exposure control, iterative reconstruction, and/or weight based adjustment of the mA/kV was utilized to reduce the radiation dose to as low as reasonably achievable. COMPARISON: CT chest 03/22/2024 CT abdomen and pelvis 02/28/2024 CLINICAL HISTORY: Abdominal pain, acute, nonlocalized;  Cough, chronic/persisting > 8 weeks, failed empiric treatment, abdominal pain, acute, nonlocalized. Mental status change. FINDINGS: CHEST: MEDIASTINUM AND LYMPH NODES: Stable 12 mm subcarinal node and 10 mm pretracheal node. No pericardial effusion. LUNGS AND PLEURA: Emphysema. Diffuse bronchial wall thickening. Bibasilar atelectasis or scarring. Subtotal atelectasis of the right middle lobe. Central alveolar micronodules in the right lower lobe are similar to prior and likely due to small airway infection/inflammation. ABDOMEN AND PELVIS: LIVER: The liver is unremarkable. GALLBLADDER AND BILE DUCTS: Cholecystectomy. SPLEEN: No acute abnormality. PANCREAS: No acute abnormality. ADRENAL GLANDS: No acute abnormality. KIDNEYS, URETERS AND BLADDER: No stones in the kidneys or ureters. No hydronephrosis. No perinephric or periureteral stranding. Urinary bladder is unremarkable. GI AND BOWEL: Right hemicolectomy with anastomosis in the mid right abdomen. There is no bowel obstruction. REPRODUCTIVE ORGANS: Hysterectomy. PERITONEUM AND RETROPERITONEUM: No ascites. No free air. VASCULATURE: Coronary artery and aortic  atherosclerotic calcification. Aorta is normal in caliber. ABDOMINAL AND PELVIS LYMPH NODES: No lymphadenopathy. BONES AND SOFT TISSUES: Left chest wall pacemaker. No acute osseous abnormality. No focal soft tissue abnormality. IMPRESSION: 1. No acute findings in the chest, abdomen, and pelvis. 2. Emphysema with findings of chronic airway inflammation in the lungs. Electronically signed by: Norman Gatlin MD 03/31/2024 02:36 AM EDT RP Workstation: [id number]   ____________________________________________  PROCEDURES  Procedure(s) performed:   Procedures ____________________________________________  INITIAL IMPRESSION / ASSESSMENT AND PLAN   he patient presented to the ED with a history of recurrent pneumonia and confusion associated with fever. The patient reported a low-grade fever that initially  started at 101F and decreased to 2F. There is a history of confusion episodes when experiencing fever, which has led to previous hospital admissions. The patient has a history of swallowing difficulties, with liquids entering the lungs, potentially causing pneumonia. The patient has been on antibiotics, including doxycycline , which was completed on Tuesday. The patient also has a history of spinal stenosis, degenerative disc disease, neuropathy, and heart issues, including a pacemaker. The patient reported recent fluid overload and is concerned about recurrent pneumonia.   Differential Diagnosis: Differential diagnosis includes but is not limited to: recurrent pneumonia, aspiration pneumonia, heart failure exacerbation, delirium secondary to infection, medication side effects, autoimmune disease, abscess and fluid overload.  Diagnostics Review  Laboratory Interpretation (Interpreted by me): Pending  Imaging Interpretation (Interpreted by me): CT of the chest planned, results pending.  Tests CONSIDERED but not performed: Chest X-ray considered but not performed as it was deemed not helpful.  Care significantly affected by the following chronic Conditions: The patient's chronic heart issues, including a pacemaker, and history of fluid overload are significant as they may contribute to recurrent pneumonia and confusion episodes. The patient's swallowing difficulties leading to aspiration are also a concern for recurrent pneumonia.  Care significantly affected by the following Social Determinants of Health: The patient has multiple chronic conditions requiring frequent medical attention, which may impact their ability to manage their health effectively.  Management  Re-Evaluations/Course of Care: The patient's condition and test results are pending further evaluation.  Potential for Clinical Deterioration: Yes, given the history of recurrent pneumonia, confusion with fever, and fluid overload, there  is a risk for clinical deterioration.  High Risk of Morbidity/Mortality Consideration: Yes, recurrent pneumonia and potential aspiration pose a risk for significant morbidity if not managed appropriately.  Initial DDx:        ED Course   Workup overall reassuring. No e/o pneumonia, sepsis, etc. Bp's softened a little bit. D/w TRH for consultation who recommended hospitalization until cultures finalized.       Images ordered viewed and obtained by myself. Agree with Radiology interpretation. Details in ED course.  Labs ordered reviewed by myself as detailed in ED course.  Consultations obtained/considered detailed in ED course.    FINAL IMPRESSION Final diagnoses:  Altered mental status, unspecified altered mental status type  Intermittent fever     Disposition Medical screening exam was performed and I feel the patient has had appropriate emergency department evaluation and work-up for their chief complaint and is stable for ADMISSION to the hospital at this time.  I discussed with Dr. Alfornia with the [facility] service and discussed labs, imaging and other work-up in the emergency room.  They agree to admission for further management and work-up of said condition. ____________________________________________   NEW OUTPATIENT MEDICATIONS STARTED DURING THIS VISIT:  New Prescriptions   No medications on file  Note:  This note was prepared with assistance of Dragon voice recognition software. Occasional wrong-word or sound-a-like substitutions may have occurred due to the inherent limitations of voice recognition software.    Debbie Bellucci, Selinda, MD 04/07/24 [phone]

## 2024-04-01 ENCOUNTER — Inpatient Hospital Stay (HOSPITAL_COMMUNITY)

## 2024-04-01 DIAGNOSIS — I5021 Acute systolic (congestive) heart failure: Secondary | ICD-10-CM

## 2024-04-01 DIAGNOSIS — R6510 Systemic inflammatory response syndrome (SIRS) of non-infectious origin without acute organ dysfunction: Secondary | ICD-10-CM

## 2024-04-01 DIAGNOSIS — I110 Hypertensive heart disease with heart failure: Secondary | ICD-10-CM

## 2024-04-01 LAB — BASIC METABOLIC PANEL WITH GFR
Anion gap: 9 (ref 5–15)
BUN: 26 mg/dL — ABNORMAL HIGH (ref 8–23)
CO2: 27 mmol/L (ref 22–32)
Calcium: 8.5 mg/dL — ABNORMAL LOW (ref 8.9–10.3)
Chloride: 104 mmol/L (ref 98–111)
Creatinine, Ser: 1.14 mg/dL — ABNORMAL HIGH (ref 0.44–1.00)
GFR, Estimated: 48 mL/min — ABNORMAL LOW (ref >60)
Glucose, Bld: 104 mg/dL — ABNORMAL HIGH (ref 70–99)
Potassium: 4.2 mmol/L (ref 3.5–5.1)
Sodium: 140 mmol/L (ref 135–145)

## 2024-04-01 LAB — CBC
HCT: 33.2 % — ABNORMAL LOW (ref 36.0–46.0)
Hemoglobin: 10.6 g/dL — ABNORMAL LOW (ref 12.0–15.0)
MCH: 29.3 pg (ref 26.0–34.0)
MCHC: 31.9 g/dL (ref 30.0–36.0)
MCV: 91.7 fL (ref 80.0–100.0)
Platelets: 156 K/uL (ref 150–400)
RBC: 3.62 MIL/uL — ABNORMAL LOW (ref 3.87–5.11)
RDW: 13.8 % (ref 11.5–15.5)
WBC: 4.4 K/uL (ref 4.0–10.5)
nRBC: 0 % (ref 0.0–0.2)

## 2024-04-01 LAB — AMMONIA: Ammonia: 59 umol/L — ABNORMAL HIGH (ref 9–35)

## 2024-04-01 LAB — URINE CULTURE: Culture: <10000 — AB

## 2024-04-01 LAB — MRSA NEXT GEN BY PCR, NASAL: MRSA by PCR Next Gen: NOT DETECTED

## 2024-04-01 MED ORDER — IPRATROPIUM-ALBUTEROL 0.5-2.5 (3) MG/3ML IN SOLN
3.0000 mL | RESPIRATORY_TRACT | Status: DC | PRN
  Administered 2024-04-01 – 2024-04-02 (×2): 3 mL via RESPIRATORY_TRACT
  Filled 2024-04-01 (×2): qty 3

## 2024-04-01 MED ORDER — LIDOCAINE 5 % EX PTCH
2.0000 | MEDICATED_PATCH | CUTANEOUS | Status: DC
  Administered 2024-04-01 – 2024-04-02 (×2): 2 via TRANSDERMAL
  Filled 2024-04-01 (×2): qty 2

## 2024-04-01 MED ORDER — LORAZEPAM 2 MG/ML IJ SOLN
1.0000 mg | INTRAMUSCULAR | Status: DC
  Filled 2024-04-01: qty 1

## 2024-04-01 MED ORDER — FENTANYL CITRATE PF 50 MCG/ML IJ SOSY
25.0000 ug | PREFILLED_SYRINGE | Freq: Once | INTRAMUSCULAR | Status: AC
  Administered 2024-04-01: 25 ug via INTRAVENOUS

## 2024-04-01 MED ORDER — FENTANYL CITRATE PF 50 MCG/ML IJ SOSY
PREFILLED_SYRINGE | INTRAMUSCULAR | Status: AC
  Filled 2024-04-01: qty 1

## 2024-04-01 MED ORDER — SENNOSIDES-DOCUSATE SODIUM 8.6-50 MG PO TABS: 1.0000 | ORAL_TABLET | Freq: Every evening | ORAL | Status: DC | PRN

## 2024-04-01 MED ORDER — FUROSEMIDE 10 MG/ML IJ SOLN
20.0000 mg | INTRAMUSCULAR | Status: AC
Start: 2024-04-02 — End: 2024-04-03
  Administered 2024-04-02: 20 mg via INTRAVENOUS

## 2024-04-01 MED ORDER — ONDANSETRON HCL 4 MG/2ML IJ SOLN: 4.0000 mg | Freq: Four times a day (QID) | INTRAMUSCULAR | Status: DC | PRN

## 2024-04-01 MED ORDER — CYCLOBENZAPRINE HCL 5 MG PO TABS
5.0000 mg | ORAL_TABLET | Freq: Every day | ORAL | Status: DC | PRN
  Administered 2024-04-01 – 2024-04-02 (×2): 5 mg via ORAL
  Filled 2024-04-01 (×2): qty 1

## 2024-04-01 MED ORDER — GUAIFENESIN 100 MG/5ML PO LIQD
5.0000 mL | ORAL | Status: DC | PRN
  Administered 2024-04-01 – 2024-04-02 (×2): 5 mL via ORAL
  Filled 2024-04-01 (×2): qty 15

## 2024-04-01 MED ORDER — COLESTIPOL HCL 1 G PO TABS
1.0000 g | ORAL_TABLET | Freq: Two times a day (BID) | ORAL | Status: DC | PRN
  Administered 2024-04-01 – 2024-04-03 (×2): 1 g via ORAL
  Filled 2024-04-01 (×4): qty 1

## 2024-04-01 MED ORDER — METOPROLOL TARTRATE 5 MG/5ML IV SOLN: 5.0000 mg | INTRAVENOUS | Status: DC | PRN

## 2024-04-01 MED ORDER — HYDRALAZINE HCL 20 MG/ML IJ SOLN: 10.0000 mg | INTRAMUSCULAR | Status: DC | PRN

## 2024-04-01 MED ORDER — KETOROLAC TROMETHAMINE 15 MG/ML IJ SOLN
15.0000 mg | INTRAMUSCULAR | Status: AC
  Administered 2024-04-02: 15 mg via INTRAVENOUS
  Filled 2024-04-01: qty 1

## 2024-04-01 MED ORDER — FUROSEMIDE 10 MG/ML IJ SOLN: 20.0000 mg | Freq: Once | INTRAMUSCULAR | Status: DC

## 2024-04-01 NOTE — Hospital Course (Addendum)
 Brief Narrative:  83 year old with history of COPD, chronic respiratory failure on 4 L nasal cannula, CHF with preserved EF, CAD, tachybradycardia syndrome status post permanent pacemaker, Crohn's disease, SBO, psoriasis, migraine, CKD stage IIIa, anxiety admitted for altered mental status with fever.  Recently admitted to the hospital  for aspiration pneumonia.  CT head is unremarkable.  CT chest abdomen pelvis shows chronic airway inflammation with emphysema.  Started on broad-spectrum antibiotics.  Infectious workup was negative therefore antibiotics were discontinued.  There was some concerns of volume overload after requiring IV fluids for dehydration and AKI.  Patient receiving IV Lasix .  Today doing significantly well therefore we will discharge patient patient will need outpatient close follow-up.   Assessment & Plan:  SIRS; resolved.  Initially noted to be febrile with elevated WBC and mild lactic acidosis.  Procalcitonin remains unremarkable, leukocytosis has resolved.  Initially received broad-spectrum antibiotics but now remains afebrile off antibiotics - COVID/flu negative, blood cultures remain unremarkable -Supportive care.    Acute congestive heart failure with preserved ejection fraction, EF 65% Recent echocardiogram shows preserved EF.  Initially dehydrated therefore required IV fluids but later required Lasix  due to some signs of volume overload.   Acute metabolic encephalopathy CT head is unremarkable.  TSH is normal.  Slightly elevated ammonia.  Lactulose  twice daily .  Precautions  Back pain - Musculoskeletal.  Has tense muscle group in the midline low back.  Muscle relaxer and pain medication prescribed.  Also advised heating pack to this area   Acute kidney injury, resolved Cr baseline 1.2, today 1.5.  Will need to follow-up outpatient with PCP and get repeat blood work.  Unfortunately we will have to continue Lasix  and Aldactone  as she is very volume sensitive and  easily has volume overloaded.  Mild fluctuation in creatinine is present acceptable range   Pharyngeal dysphagia During  her prior hospitalization patient was noted to have concern for aspiration for which she was recommended to be on heart healthy diet with thicken liquids, which we will continue   Hypomagnesemia Repletion   COPD Chronic respiratory failure with hypoxia Chronically uses 4 L nasal cannula.  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  Bowel regimen   Tachybradycardia syndrome s/p PPM   Crohn's disease Patient reports intermittently having diarrhea, but denies having any blood present. Formed stool therefore C. difficile study canceled At her outpatient colestipol  at her request   History of small bowel obstruction No signs of bowel obstruction noted at this time   Anxiety - Continue Xanax  as needed    Neuropathy - Continue gabapentin  and Lyrica    Hyperlipidemia - Continue fenofibrate  and Zetia    GERD - Continue PPI    DVT prophylaxis: apixaban  (ELIQUIS ) tablet 5 mg     Code Status: Full Code Family Communication:   Status is: Inpatient Remains inpatient appropriate because: Continue hospital stay for further management.  Will monitor off antibiotics   PT Follow up Recs:   Subjective:  Doing much better today, back pain as mostly subsided and sob has resolved. She would like to go home today.   Examination:  General exam: Appears calm and comfortable  Respiratory system: Clear to auscultation. Respiratory effort normal. Cardiovascular system: S1 & S2 heard, RRR. No JVD, murmurs, rubs, gallops or clicks. No pedal edema. Gastrointestinal system: Abdomen is nondistended, soft and nontender. No organomegaly or masses felt. Normal bowel  sounds heard. Central nervous system: Alert and oriented. No focal neurological  deficits. Extremities: Symmetric 5 x 5 power. Skin: No rashes, lesions or ulcers Psychiatry: Judgement and insight appear normal. Mood & affect appropriate.

## 2024-04-01 NOTE — Plan of Care (Signed)
 Patient woke up from the sleep and complaining about significant left-sided flank pain 10 out of 10 did not improve with fentanyl .  Patient is very anxious stating that I am shaking checked by heart rate.  Hemodynamically stable O2 sat 94% which is at baseline. -Chest x-ray showing pulmonary vascular congestion giving Lasix  20 mg IV. - Due to anxiety giving Ativan  1 mg IV. - Fentanyl  did not touch her improvement of the pain still complaining about left-sided flank pain.  Giving Toradol .   -Patient has CT abdomen chest pelvis 9/13 no evidence of nephrolithiasis.  And x-ray abdomen today unremarkable finding. Patient has history of Crohn's disease and 2 episodes of diarrhea however stating that left-sided flank pain likely spasm and it comes and go. - Unclear etiology of left-sided flank pain as of now. Patient receiving Toradol , fentanyl , Flexeril , Ativan  and gabapentin .  Rene Sizelove, MD [facility] 04/02/2024, 12:01 AM

## 2024-04-01 NOTE — Evaluation (Signed)
 Occupational Therapy Evaluation Patient Details Name: Brittany Irwin MRN: [id number] DOB: [birth year] Today's Date: 04/01/2024   History of Present Illness   83 y.o. female presents to [facility] hospital on 03/30/24 with sepsis secondary to recurrent pneumonia.  Pt recently admitted 9/3 through 9/7 with concern for sepsis secondary to a aspiration pneumonia. PMH:  PNA 3/25 and 8/11, Crohns, HTN, CHF, CKD, chronic back pain, anemia, COPD, chronic respiratory failure with hypoxia, SBO, psoriasis, migraines, and anxiety.     Clinical Impressions Pt admitted for above, PTA pt was ind with ADLs/mod I with Rollator and on 4L 02 at home. Pt currently presenting not far from functional baseline ambulating with supervision + RW and completing ADLs with setup/supervision. She continues to have DOE and needed 3 standing rest breaks throughout activity, reinforced energy conservation principles PRN throughout session. Encourage pt to use IS. OT to follow pt at a distance to ensure adequate progression close to functional baseline. No post acute OT recommended.      If plan is discharge home, recommend the following:         Functional Status Assessment   Patient has had a recent decline in their functional status and demonstrates the ability to make significant improvements in function in a reasonable and predictable amount of time.     Equipment Recommendations   None recommended by OT     Recommendations for Other Services         Precautions/Restrictions   Precautions Precautions: Fall Recall of Precautions/Restrictions: Intact Precaution/Restrictions Comments: 4L O2 at baseline Restrictions Weight Bearing Restrictions Per Provider Order: No     Mobility Bed Mobility Overal bed mobility: Modified Independent                  Transfers Overall transfer level: Needs assistance Equipment used: None Transfers: Sit to/from Stand Sit to Stand: Supervision            General transfer comment: supervision for safety      Balance Overall balance assessment: Needs assistance Sitting-balance support: No upper extremity supported, Feet supported Sitting balance-Leahy Scale: Good     Standing balance support: Single extremity supported, Reliant on assistive device for balance Standing balance-Leahy Scale: Poor                             ADL either performed or assessed with clinical judgement   ADL Overall ADL's : Needs assistance/impaired Eating/Feeding: Independent;Sitting   Grooming: Supervision/safety;Standing   Upper Body Bathing: Sitting;Set up   Lower Body Bathing: Set up;Sitting/lateral leans   Upper Body Dressing : Set up;Sitting   Lower Body Dressing: Set up;Sit to/from stand   Toilet Transfer: Supervision/safety;BSC/3in1;Squat-pivot   Toileting- Architect and Hygiene: Supervision/safety;Sitting/lateral lean       Functional mobility during ADLs: Supervision/safety;Rolling walker (2 wheels)       Vision         Perception         Praxis         Pertinent Vitals/Pain Pain Assessment Pain Assessment: Faces Faces Pain Scale: Hurts even more Pain Location: mid back Pain Descriptors / Indicators: Aching, Discomfort, Grimacing, Guarding Pain Intervention(s): Monitored during session, Limited activity within patient's tolerance, Repositioned, Patient requesting pain meds-RN notified     Extremity/Trunk Assessment Upper Extremity Assessment Upper Extremity Assessment: Overall WFL for tasks assessed   Lower Extremity Assessment Lower Extremity Assessment: Defer to PT evaluation   Cervical / Trunk Assessment Cervical /  Trunk Assessment: Kyphotic   Communication Communication Communication: No apparent difficulties   Cognition Arousal: Alert Behavior During Therapy: WFL for tasks assessed/performed Cognition: No apparent impairments                               Following  commands: Intact       Cueing  General Comments   Cueing Techniques: Verbal cues  Sp02 on 4L at rest 94%, drop to 89% following 250ft ambulation, reinforced pursed lip breathing and increased Sp02 to 93%.   Exercises     Shoulder Instructions      Home Living Family/patient expects to be discharged to:: Private residence Living Arrangements: Alone Available Help at Discharge: Family;Available PRN/intermittently Type of Home: Apartment Home Access: Level entry     Home Layout: One level     Bathroom Shower/Tub: Chief Strategy Officer: Standard Bathroom Accessibility: Yes   Home Equipment: Agricultural consultant (2 wheels);Rollator (4 wheels);Grab bars - tub/shower;Hand held shower head   Additional Comments: Pt reports plans for a shower chair. (Pulled from previous admission. Pt reports no change)      Prior Functioning/Environment Prior Level of Function : Independent/Modified Independent;Driving             Mobility Comments: 4L O2 baseline, needs rollator at times ADLs Comments: sister provides transportation, ind with ADLs, cooks    OT Problem List: Cardiopulmonary status limiting activity   OT Treatment/Interventions: Therapeutic exercise;Therapeutic activities      OT Goals(Current goals can be found in the care plan section)   Acute Rehab OT Goals Patient Stated Goal: get better and go home OT Goal Formulation: With patient Time For Goal Achievement: 04/15/24 Potential to Achieve Goals: Good ADL Goals Pt Will Perform Grooming: Independently;standing Pt Will Transfer to Toilet: Independently;ambulating Additional ADL Goal #1: Pt will demonstrate independent use of energy conservation strategies PRN to complete functional activities   OT Frequency:  Min 1X/week    Co-evaluation              AM-PAC OT 6 Clicks Daily Activity     Outcome Measure Help from another person eating meals?: None Help from another person taking care of  personal grooming?: A Little Help from another person toileting, which includes using toliet, bedpan, or urinal?: A Little Help from another person bathing (including washing, rinsing, drying)?: A Little Help from another person to put on and taking off regular upper body clothing?: A Little Help from another person to put on and taking off regular lower body clothing?: A Little 6 Click Score: 19   End of Session Equipment Utilized During Treatment: Rolling walker (2 wheels);Oxygen (4L) Nurse Communication: Mobility status  Activity Tolerance: Patient tolerated treatment well Patient left: in bed;with call bell/phone within reach  OT Visit Diagnosis: Other (comment) (SOB)                Time: 8684-8663 OT Time Calculation (min): 21 min Charges:  OT General Charges $OT Visit: 1 Visit OT Evaluation $OT Eval Low Complexity: 1 Low  04/01/2024  AB, OTR/L  Acute Rehabilitation Services  Office: [phone]   Curtistine JONETTA Das 04/01/2024, 3:56 PM

## 2024-04-01 NOTE — Progress Notes (Signed)
 PROGRESS NOTE    Brittany Irwin  FMW:[id number] DOB: September 17, 1940 DOA: 03/30/2024 PCP: [name], MD    Brief Narrative:  83 year old with history of COPD, chronic respiratory failure on 4 L nasal cannula, CHF with preserved EF, CAD, tachybradycardia syndrome status post permanent pacemaker, Crohn's disease, SBO, psoriasis, migraine, CKD stage IIIa, anxiety admitted for altered mental status with fever.  Recently admitted to the hospital  for aspiration pneumonia.  CT head is unremarkable.  CT chest abdomen pelvis shows chronic airway inflammation with emphysema.  Started on broad-spectrum antibiotics.   Assessment & Plan:  SIRS Initially noted to be febrile with elevated WBC and mild lactic acidosis.  Started on broad-spectrum antibiotics, procalcitonin is unremarkable.  Leukocytosis has resolved.  Will discontinue antibiotics - COVID/flu negative, blood cultures remain unremarkable -Supportive care.  Continue gentle hydration   Acute metabolic encephalopathy CT head is unremarkable.  TSH is normal.  Check ammonia levels. .  Precautions   Acute kidney injury Baseline creatinine 1.0, admission creatinine 1.53.  Continue IV fluids   Pharyngeal dysphagia During  her prior hospitalization patient was noted to have concern for aspiration for which she was recommended to be on heart healthy diet with thicken liquids, which we will continue   Hypomagnesemia Repletion   COPD Chronic respiratory failure with hypoxia Chronically uses 4 L nasal cannula.  Continue home bronchodilators   Heart failure with preserved ejection fraction, EF 65% Echocardiogram in August thousand 25 showed EF of 65%.  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  Bowel regimen   Tachybradycardia syndrome s/p PPM   Crohn's disease Patient reports intermittently  having diarrhea, but denies having any blood present. Formed stool therefore C. difficile study canceled At her outpatient colestipol  at her request   History of small bowel obstruction No signs of bowel obstruction noted at this time   Anxiety - Continue Xanax  as needed    Neuropathy - Continue gabapentin  and Lyrica    Hyperlipidemia - Continue fenofibrate  and Zetia    GERD - Continue PPI    DVT prophylaxis: apixaban  (ELIQUIS ) tablet 5 mg     Code Status: Full Code Family Communication:   Status is: Inpatient Remains inpatient appropriate because: Continue hospital stay for further management.  Will monitor off antibiotics   PT Follow up Recs:   Subjective:  Seen at bedside.  Feels a little better this morning.  Remains afebrile  Examination:  General exam: Appears calm and comfortable  Respiratory system: Clear to auscultation. Respiratory effort normal. Cardiovascular system: S1 & S2 heard, RRR. No JVD, murmurs, rubs, gallops or clicks. No pedal edema. Gastrointestinal system: Abdomen is nondistended, soft and nontender. No organomegaly or masses felt. Normal bowel sounds heard. Central nervous system: Alert and oriented. No focal neurological deficits. Extremities: Symmetric 5 x 5 power. Skin: No rashes, lesions or ulcers Psychiatry: Judgement and insight appear normal. Mood & affect appropriate.                Diet Orders (From admission, onward)     Start     Ordered   03/31/24 1249  Diet Heart Room service appropriate? Yes; Fluid consistency: Nectar Thick  Diet effective now       Question Answer Comment  Room service appropriate? Yes   Fluid consistency: Nectar Thick      03/31/24 1248  Objective: Vitals:   03/31/24 2124 04/01/24 0424 04/01/24 0752 04/01/24 0919  BP: (!) 135/96 115/61  (!) 131/57  Pulse: 77 60  65  Resp: 20 15  18   Temp: 97.8 F (36.6 C) 98 F (36.7 C)  98.1 F (36.7 C)  TempSrc: Oral Axillary     SpO2: 95% 95% 96% 96%  Weight:      Height:        Intake/Output Summary (Last 24 hours) at 04/01/2024 1123 Last data filed at 04/01/2024 1107 Gross per 24 hour  Intake 1260 ml  Output 0 ml  Net 1260 ml   Filed Weights   03/31/24 1600  Weight: 73.8 kg    Scheduled Meds:  acidophilus  1 capsule Oral TID   apixaban   5 mg Oral BID   atenolol   25 mg Oral Daily   budesonide -glycopyrrolate -formoterol   2 puff Inhalation BID   diclofenac  Sodium  1 Application Topical BID   ezetimibe   10 mg Oral QHS   fenofibrate   160 mg Oral Daily   pantoprazole   40 mg Oral Daily   pregabalin   50 mg Oral BID   sodium chloride  flush  3 mL Intravenous Q12H   spironolactone   12.5 mg Oral Daily   Continuous Infusions:  Nutritional status     Body mass index is 27.93 kg/m.  Data Reviewed:   CBC: Recent Labs  Lab 03/30/24 2218 04/01/24 0534  WBC 11.4* 4.4  HGB 12.2 10.6*  HCT 39.0 33.2*  MCV 92.9 91.7  PLT 227 156   Basic Metabolic Panel: Recent Labs  Lab 03/30/24 2218 04/01/24 0534  NA 140 140  K 4.0 4.2  CL 99 104  CO2 26 27  GLUCOSE 131* 104*  BUN 33* 26*  CREATININE 1.53* 1.14*  CALCIUM 8.6* 8.5*  MG 1.2*  --    GFR: Estimated Creatinine Clearance: 38.1 mL/min (A) (by C-G formula based on SCr of 1.14 mg/dL (H)). Liver Function Tests: Recent Labs  Lab 03/30/24 2218  AST 39  ALT 16  ALKPHOS 40  BILITOT 1.1  PROT 5.7*  ALBUMIN 3.9   No results for input(s): LIPASE, AMYLASE in the last 168 hours. No results for input(s): AMMONIA in the last 168 hours. Coagulation Profile: No results for input(s): INR, PROTIME in the last 168 hours. Cardiac Enzymes: No results for input(s): CKTOTAL, CKMB, CKMBINDEX, TROPONINI in the last 168 hours. BNP (last 3 results) No results for input(s): PROBNP in the last 8760 hours. HbA1C: No results for input(s): HGBA1C in the last 72 hours. CBG: Recent Labs  Lab 03/30/24 2234  GLUCAP 125*   Lipid  Profile: No results for input(s): CHOL, HDL, LDLCALC, TRIG, CHOLHDL, LDLDIRECT in the last 72 hours. Thyroid  Function Tests: Recent Labs    03/31/24 0015  TSH 1.771   Anemia Panel: No results for input(s): VITAMINB12, FOLATE, FERRITIN, TIBC, IRON, RETICCTPCT in the last 72 hours. Sepsis Labs: Recent Labs  Lab 03/31/24 0016 03/31/24 0026  PROCALCITON <0.10  --   LATICACIDVEN  --  2.3*    Recent Results (from the past 240 hours)  MRSA Next Gen by PCR, Nasal     Status: None   Collection Time: 03/22/24  9:19 PM   Specimen: Nasal Mucosa; Nasal Swab  Result Value Ref Range Status   MRSA by PCR Next Gen NOT DETECTED NOT DETECTED Final    Comment: (NOTE) The GeneXpert MRSA Assay (FDA approved for NASAL specimens only), is one component of a comprehensive MRSA colonization surveillance  program. It is not intended to diagnose MRSA infection nor to guide or monitor treatment for MRSA infections. Test performance is not FDA approved in patients less than 42 years old. Performed at [facility], [address] [place], [place] [address]   Gastrointestinal Panel by PCR , Stool     Status: None   Collection Time: 03/22/24  9:28 PM   Specimen: STOOL  Result Value Ref Range Status   Campylobacter species NOT DETECTED NOT DETECTED Final   Plesimonas shigelloides NOT DETECTED NOT DETECTED Final   Salmonella species NOT DETECTED NOT DETECTED Final   Yersinia enterocolitica NOT DETECTED NOT DETECTED Final   Vibrio species NOT DETECTED NOT DETECTED Final   Vibrio cholerae NOT DETECTED NOT DETECTED Final   Enteroaggregative E coli (EAEC) NOT DETECTED NOT DETECTED Final   Enteropathogenic E coli (EPEC) NOT DETECTED NOT DETECTED Final   Enterotoxigenic E coli (ETEC) NOT DETECTED NOT DETECTED Final   Shiga like toxin producing E coli (STEC) NOT DETECTED NOT DETECTED Final   Shigella/Enteroinvasive E coli (EIEC) NOT DETECTED NOT DETECTED Final   Cryptosporidium  NOT DETECTED NOT DETECTED Final   Cyclospora cayetanensis NOT DETECTED NOT DETECTED Final   Entamoeba histolytica NOT DETECTED NOT DETECTED Final   Giardia lamblia NOT DETECTED NOT DETECTED Final   Adenovirus F40/41 NOT DETECTED NOT DETECTED Final   Astrovirus NOT DETECTED NOT DETECTED Final   Norovirus GI/GII NOT DETECTED NOT DETECTED Final   Rotavirus A NOT DETECTED NOT DETECTED Final   Sapovirus (I, II, IV, and V) NOT DETECTED NOT DETECTED Final    Comment: Performed at [facility], [address] [place], [place] [address]  C Difficile Quick Screen w PCR reflex     Status: None   Collection Time: 03/22/24  9:28 PM   Specimen: STOOL  Result Value Ref Range Status   C Diff antigen NEGATIVE NEGATIVE Final   C Diff toxin NEGATIVE NEGATIVE Final   C Diff interpretation No C. difficile detected.  Final    Comment: Performed at [facility], [address] [place], [place] [address]  Blood culture (routine x 2)     Status: None (Preliminary result)   Collection Time: 03/30/24 11:09 PM   Specimen: BLOOD LEFT ARM  Result Value Ref Range Status   Specimen Description BLOOD LEFT ARM  Final   Special Requests   Final    BOTTLES DRAWN AEROBIC AND ANAEROBIC Blood Culture results may not be optimal due to an inadequate volume of blood received in culture bottles   Culture   Final    NO GROWTH 1 DAY Performed at [facility], [address], [place], [place] [address]    Report Status PENDING  Incomplete  Blood culture (routine x 2)     Status: None (Preliminary result)   Collection Time: 03/31/24 12:16 AM   Specimen: BLOOD RIGHT HAND  Result Value Ref Range Status   Specimen Description BLOOD RIGHT HAND  Final   Special Requests   Final    BOTTLES DRAWN AEROBIC AND ANAEROBIC Blood Culture adequate volume   Culture   Final    NO GROWTH 1 DAY Performed at [facility], [address] [place], [place] [address]    Report Status PENDING  Incomplete  Resp  panel by RT-PCR (RSV, Flu A&B, Covid) Anterior Nasal Swab     Status: None   Collection Time: 03/31/24 10:32 AM   Specimen: Anterior Nasal Swab  Result Value Ref Range Status   SARS Coronavirus 2 by RT PCR NEGATIVE NEGATIVE Final   Influenza A by PCR NEGATIVE NEGATIVE Final   Influenza B by PCR NEGATIVE NEGATIVE Final    Comment: (NOTE) The Xpert Xpress SARS-CoV-2/FLU/RSV plus assay is intended as an aid in the diagnosis of influenza from Nasopharyngeal swab specimens and should not be used as a sole basis for treatment. Nasal washings and aspirates are unacceptable for Xpert Xpress SARS-CoV-2/FLU/RSV testing.  Fact Sheet for Patients: BloggerCourse.com  Fact Sheet for Healthcare Providers: SeriousBroker.it  This test is not yet approved or cleared by the [place]  FDA and has been authorized for detection and/or diagnosis of SARS-CoV-2 by FDA under an Emergency Use Authorization (EUA). This EUA will remain in effect (meaning this test can be used) for the duration of the COVID-19 declaration under Section 564(b)(1) of the Act, 21 U.S.C. section 360bbb-3(b)(1), unless the authorization is terminated or revoked.     Resp Syncytial Virus by PCR NEGATIVE NEGATIVE Final    Comment: (NOTE) Fact Sheet for Patients: BloggerCourse.com  Fact Sheet for Healthcare Providers: SeriousBroker.it  This test is not yet approved or cleared by the [place]  FDA and has been authorized for detection and/or diagnosis of SARS-CoV-2 by FDA under an Emergency Use Authorization (EUA). This EUA will remain in effect (meaning this test can be used) for the duration of the COVID-19 declaration under Section 564(b)(1) of the Act, 21 U.S.C. section 360bbb-3(b)(1), unless the authorization is terminated or revoked.  Performed at [facility], [address], [place], [place] [address]    MRSA Next Gen by PCR, Nasal     Status: None   Collection Time: 03/31/24 11:21 PM   Specimen: Nasal Mucosa; Nasal Swab  Result Value Ref Range Status   MRSA by PCR Next Gen NOT DETECTED NOT DETECTED Final    Comment: (NOTE) The GeneXpert MRSA Assay (FDA approved for NASAL specimens only), is one component of a comprehensive MRSA colonization surveillance program. It is not intended to diagnose MRSA infection nor to guide or monitor treatment for MRSA infections. Test performance is not FDA approved in patients less than 70 years old. Performed at [facility], [address] [place], [place] [address]          Radiology Studies: CT Head Wo Contrast Result Date: 03/31/2024 EXAM: CT HEAD WITHOUT CONTRAST 03/31/2024 02:22:51 AM TECHNIQUE: CT of the head was performed without the administration of intravenous contrast. Automated exposure control, iterative reconstruction, and/or weight based adjustment of the mA/kV was utilized to reduce the radiation dose to as low as reasonably achievable. COMPARISON: 02/28/2024 CLINICAL HISTORY: Mental status change, unknown cause. Mental status change; Abdominal pain, acute, nonlocalized, Cough, chronic/persisting > 8 weeks, failed empiric treatment, abdominal pain, acute, nonlocalized. FINDINGS: BRAIN AND VENTRICLES: No acute hemorrhage. No evidence of acute infarct. No hydrocephalus. No extra-axial collection. No mass effect or midline shift. ORBITS: No acute abnormality. SINUSES: No acute abnormality. SOFT TISSUES AND SKULL: No acute soft tissue abnormality. No skull fracture. IMPRESSION: 1. No acute intracranial abnormality. Electronically signed by: Norman Gatlin MD 03/31/2024 02:38 AM EDT RP Workstation: [id number]   CT CHEST ABDOMEN PELVIS W CONTRAST Result Date: 03/31/2024 EXAM: CT CHEST, ABDOMEN AND PELVIS WITH CONTRAST 03/31/2024 02:22:51 AM TECHNIQUE: CT of the chest, abdomen and pelvis was performed with the administration of  intravenous contrast (75mL iohexol  (OMNIPAQUE ) 350 MG/ML injection). Multiplanar reformatted images are provided for review. Automated exposure control, iterative reconstruction, and/or  weight based adjustment of the mA/kV was utilized to reduce the radiation dose to as low as reasonably achievable. COMPARISON: CT chest 03/22/2024 CT abdomen and pelvis 02/28/2024 CLINICAL HISTORY: Abdominal pain, acute, nonlocalized; Cough, chronic/persisting > 8 weeks, failed empiric treatment, abdominal pain, acute, nonlocalized. Mental status change. FINDINGS: CHEST: MEDIASTINUM AND LYMPH NODES: Stable 12 mm subcarinal node and 10 mm pretracheal node. No pericardial effusion. LUNGS AND PLEURA: Emphysema. Diffuse bronchial wall thickening. Bibasilar atelectasis or scarring. Subtotal atelectasis of the right middle lobe. Central alveolar micronodules in the right lower lobe are similar to prior and likely due to small airway infection/inflammation. ABDOMEN AND PELVIS: LIVER: The liver is unremarkable. GALLBLADDER AND BILE DUCTS: Cholecystectomy. SPLEEN: No acute abnormality. PANCREAS: No acute abnormality. ADRENAL GLANDS: No acute abnormality. KIDNEYS, URETERS AND BLADDER: No stones in the kidneys or ureters. No hydronephrosis. No perinephric or periureteral stranding. Urinary bladder is unremarkable. GI AND BOWEL: Right hemicolectomy with anastomosis in the mid right abdomen. There is no bowel obstruction. REPRODUCTIVE ORGANS: Hysterectomy. PERITONEUM AND RETROPERITONEUM: No ascites. No free air. VASCULATURE: Coronary artery and aortic atherosclerotic calcification. Aorta is normal in caliber. ABDOMINAL AND PELVIS LYMPH NODES: No lymphadenopathy. BONES AND SOFT TISSUES: Left chest wall pacemaker. No acute osseous abnormality. No focal soft tissue abnormality. IMPRESSION: 1. No acute findings in the chest, abdomen, and pelvis. 2. Emphysema with findings of chronic airway inflammation in the lungs. Electronically signed by: Norman Gatlin MD 03/31/2024 02:36 AM EDT RP Workstation: [id number]           LOS: 1 day   Time spent= 35 mins    Burgess JAYSON Dare, MD [facility]  If 7PM-7AM, please contact night-coverage  04/01/2024, 11:23 AM

## 2024-04-02 DIAGNOSIS — R6510 Systemic inflammatory response syndrome (SIRS) of non-infectious origin without acute organ dysfunction: Secondary | ICD-10-CM | POA: Diagnosis not present

## 2024-04-02 LAB — BASIC METABOLIC PANEL WITH GFR
Anion gap: 10 (ref 5–15)
BUN: 20 mg/dL (ref 8–23)
CO2: 26 mmol/L (ref 22–32)
Calcium: 8.8 mg/dL — ABNORMAL LOW (ref 8.9–10.3)
Chloride: 106 mmol/L (ref 98–111)
Creatinine, Ser: 1 mg/dL (ref 0.44–1.00)
GFR, Estimated: 57 mL/min — ABNORMAL LOW (ref >60)
Glucose, Bld: 123 mg/dL — ABNORMAL HIGH (ref 70–99)
Potassium: 3.8 mmol/L (ref 3.5–5.1)
Sodium: 142 mmol/L (ref 135–145)

## 2024-04-02 LAB — CBC
HCT: 34 % — ABNORMAL LOW (ref 36.0–46.0)
Hemoglobin: 10.7 g/dL — ABNORMAL LOW (ref 12.0–15.0)
MCH: 29.3 pg (ref 26.0–34.0)
MCHC: 31.5 g/dL (ref 30.0–36.0)
MCV: 93.2 fL (ref 80.0–100.0)
Platelets: 182 K/uL (ref 150–400)
RBC: 3.65 MIL/uL — ABNORMAL LOW (ref 3.87–5.11)
RDW: 13.8 % (ref 11.5–15.5)
WBC: 6.4 K/uL (ref 4.0–10.5)
nRBC: 0 % (ref 0.0–0.2)

## 2024-04-02 LAB — BRAIN NATRIURETIC PEPTIDE: B Natriuretic Peptide: 779.2 pg/mL — ABNORMAL HIGH (ref 0.0–100.0)

## 2024-04-02 LAB — PHOSPHORUS: Phosphorus: 2.3 mg/dL — ABNORMAL LOW (ref 2.5–4.6)

## 2024-04-02 LAB — MAGNESIUM: Magnesium: 1.9 mg/dL (ref 1.7–2.4)

## 2024-04-02 MED ORDER — K PHOS MONO-SOD PHOS DI & MONO 155-852-130 MG PO TABS
500.0000 mg | ORAL_TABLET | ORAL | Status: AC
  Administered 2024-04-02 (×2): 500 mg via ORAL
  Filled 2024-04-02 (×2): qty 2

## 2024-04-02 MED ORDER — CYCLOBENZAPRINE HCL 5 MG PO TABS
5.0000 mg | ORAL_TABLET | Freq: Once | ORAL | Status: AC
Start: 2024-04-02 — End: 2024-04-02
  Administered 2024-04-02: 5 mg via ORAL
  Filled 2024-04-02: qty 1

## 2024-04-02 MED ORDER — HYDROMORPHONE HCL 1 MG/ML IJ SOLN
0.5000 mg | Freq: Once | INTRAMUSCULAR | Status: AC
  Administered 2024-04-02: 0.5 mg via INTRAVENOUS
  Filled 2024-04-02: qty 0.5

## 2024-04-02 MED ORDER — LACTULOSE 10 GM/15ML PO SOLN
20.0000 g | Freq: Two times a day (BID) | ORAL | Status: DC
  Administered 2024-04-02: 20 g via ORAL
  Filled 2024-04-02 (×3): qty 30

## 2024-04-02 MED ORDER — HYDROMORPHONE HCL 1 MG/ML IJ SOLN
INTRAMUSCULAR | Status: AC
  Filled 2024-04-02: qty 0.5

## 2024-04-02 MED ORDER — FUROSEMIDE 10 MG/ML IJ SOLN
INTRAMUSCULAR | Status: AC
  Filled 2024-04-02: qty 2

## 2024-04-02 MED ORDER — KETOROLAC TROMETHAMINE 15 MG/ML IJ SOLN
7.5000 mg | Freq: Once | INTRAMUSCULAR | Status: AC
  Administered 2024-04-02: 7.5 mg via INTRAVENOUS
  Filled 2024-04-02: qty 1

## 2024-04-02 MED ORDER — FUROSEMIDE 10 MG/ML IJ SOLN
40.0000 mg | Freq: Once | INTRAMUSCULAR | Status: AC
  Administered 2024-04-02: 40 mg via INTRAVENOUS
  Filled 2024-04-02: qty 4

## 2024-04-02 MED ORDER — CYCLOBENZAPRINE HCL 5 MG PO TABS
5.0000 mg | ORAL_TABLET | Freq: Three times a day (TID) | ORAL | Status: DC | PRN
  Administered 2024-04-02: 5 mg via ORAL
  Filled 2024-04-02: qty 1

## 2024-04-02 NOTE — Evaluation (Signed)
 Physical Therapy Evaluation Patient Details Name: Brittany Irwin MRN: [id number] DOB: [birth year] Today's Date: 04/02/2024  History of Present Illness  83 y.o. female presents to [facility] hospital on 03/30/24 with sepsis secondary to recurrent pneumonia.  Pt recently admitted 9/3 through 9/7 with concern for sepsis secondary to a aspiration pneumonia. PMH:  PNA 3/25 and 8/11, Crohns, HTN, CHF, CKD, chronic back pain, anemia, COPD, chronic respiratory failure with hypoxia, SBO, psoriasis, migraines, and anxiety.  Clinical Impression   Pt admitted for above, PTA pt was ind with ADLs/mod I with Rollator and on 4L 02 at home. Pt currently presenting not far from functional baseline ambulating with supervision + RW and completing Mobility and ADLs with setup/supervision. She continues to have DOE and needed 3 standing rest breaks throughout activity, reinforced energy conservation principles PRN throughout session. Recommend dc home with HHPT follow up; will follow acutely, and recommend Mobility Team to work with Ms. Pederson        If plan is discharge home, recommend the following: Assistance with cooking/housework   Can travel by private vehicle        Equipment Recommendations None recommended by PT  Recommendations for Other Services       Functional Status Assessment Patient has had a recent decline in their functional status and demonstrates the ability to make significant improvements in function in a reasonable and predictable amount of time.     Precautions / Restrictions Precautions Precautions: Fall Recall of Precautions/Restrictions: Intact Precaution/Restrictions Comments: 4L O2 at baseline Restrictions Weight Bearing Restrictions Per Provider Order: No      Mobility  Bed Mobility Overal bed mobility: Modified Independent                  Transfers Overall transfer level: Needs assistance Equipment used: Rollator (4 wheels) Transfers: Sit to/from Stand Sit to  Stand: Supervision           General transfer comment: supervision for safety    Ambulation/Gait Ambulation/Gait assistance: Supervision Gait Distance (Feet): 400 Feet Assistive device: Rollator (4 wheels) Gait Pattern/deviations: Step-through pattern       General Gait Details: Noting DOE 2/4; one standing rest break; good self-monitor  Stairs            Wheelchair Mobility     Tilt Bed    Modified Rankin (Stroke Patients Only)       Balance     Sitting balance-Leahy Scale: Good       Standing balance-Leahy Scale: Fair                               Pertinent Vitals/Pain Pain Assessment Pain Assessment: Faces Faces Pain Scale: No hurt Pain Location: mid back -- recently recieved pain meds Pain Intervention(s): Monitored during session, Premedicated before session    Home Living Family/patient expects to be discharged to:: Private residence Living Arrangements: Alone Available Help at Discharge: Family;Available PRN/intermittently Type of Home: Apartment Home Access: Level entry       Home Layout: One level Home Equipment: Agricultural consultant (2 wheels);Rollator (4 wheels);Grab bars - tub/shower;Hand held shower head Additional Comments: Pt reports plans for a shower chair. (Pulled from previous admission. Pt reports no change)    Prior Function Prior Level of Function : Independent/Modified Independent;Driving             Mobility Comments: 4L O2 baseline, needs rollator at times ADLs Comments: sister provides transportation, ind with ADLs,  cooks     Extremity/Trunk Assessment   Upper Extremity Assessment Upper Extremity Assessment: Overall WFL for tasks assessed    Lower Extremity Assessment Lower Extremity Assessment: Overall WFL for tasks assessed (though decr functional capacity)    Cervical / Trunk Assessment Cervical / Trunk Assessment: Kyphotic  Communication   Communication Communication: No apparent  difficulties    Cognition Arousal: Alert Behavior During Therapy: WFL for tasks assessed/performed                             Following commands: Intact       Cueing Cueing Techniques: Verbal cues     General Comments General comments (skin integrity, edema, etc.): Walked on 4 L supplemental O2; DOE noticable to observer, able to converse while walking    Exercises     Assessment/Plan    PT Assessment Patient needs continued PT services  PT Problem List Decreased activity tolerance;Cardiopulmonary status limiting activity       PT Treatment Interventions Gait training;Therapeutic exercise;Patient/family education    PT Goals (Current goals can be found in the Care Plan section)  Acute Rehab PT Goals Patient Stated Goal: to return to independence PT Goal Formulation: With patient Time For Goal Achievement: 04/16/24 Potential to Achieve Goals: Good    Frequency Min 2X/week     Co-evaluation               AM-PAC PT 6 Clicks Mobility  Outcome Measure Help needed turning from your back to your side while in a flat bed without using bedrails?: None Help needed moving from lying on your back to sitting on the side of a flat bed without using bedrails?: None Help needed moving to and from a bed to a chair (including a wheelchair)?: None Help needed standing up from a chair using your arms (e.g., wheelchair or bedside chair)?: A Little Help needed to walk in hospital room?: A Little Help needed climbing 3-5 steps with a railing? : A Little 6 Click Score: 21    End of Session Equipment Utilized During Treatment: Oxygen Activity Tolerance: Patient tolerated treatment well Patient left: in bed;with call bell/phone within reach Nurse Communication: Mobility status PT Visit Diagnosis: Other abnormalities of gait and mobility (R26.89)    Time: 1433-1500 PT Time Calculation (min) (ACUTE ONLY): 27 min   Charges:   PT Evaluation $PT Eval Low  Complexity: 1 Low PT Treatments $Gait Training: 8-22 mins PT General Charges $$ ACUTE PT VISIT: 1 Visit         Silvano Currier, PT  Acute Rehabilitation Services Office [phone] Secure Chat welcomed   Silvano VEAR Currier 04/02/2024, 3:07 PM

## 2024-04-02 NOTE — Progress Notes (Signed)
 PROGRESS NOTE    Brittany Irwin  FMW:[id number] DOB: [birth year] DOA: 03/30/2024 PCP: [name], MD    Brief Narrative:  83 year old with history of COPD, chronic respiratory failure on 4 L nasal cannula, CHF with preserved EF, CAD, tachybradycardia syndrome status post permanent pacemaker, Crohn's disease, SBO, psoriasis, migraine, CKD stage IIIa, anxiety admitted for altered mental status with fever.  Recently admitted to the hospital  for aspiration pneumonia.  CT head is unremarkable.  CT chest abdomen pelvis shows chronic airway inflammation with emphysema.  Started on broad-spectrum antibiotics.  Infectious workup was negative therefore antibiotics were discontinued.  There was some concerns of volume overload after requiring IV fluids for dehydration and AKI.  Patient receiving IV Lasix .   Assessment & Plan:  SIRS Initially noted to be febrile with elevated WBC and mild lactic acidosis.  Procalcitonin remains unremarkable, leukocytosis has resolved.  Initially received broad-spectrum antibiotics but now remains afebrile off antibiotics - COVID/flu negative, blood cultures remain unremarkable -Supportive care.    Acute congestive heart failure with preserved ejection fraction, EF 65% Recent echocardiogram shows preserved EF.  Initially dehydrated and received fluids but now some suspicion of volume overload therefore received Lasix .  Optimize electrolytes.   Acute metabolic encephalopathy CT head is unremarkable.  TSH is normal.  Slightly elevated ammonia.  Lactulose  twice daily .  Precautions  Back pain - Musculoskeletal.  Has tense muscle group in the midline low back.  Muscle relaxer and pain medication prescribed.  Also advised heating pack to this area   Acute kidney injury, resolved Baseline creatinine 1.0, admission creatinine 1.53.  Resolved   Pharyngeal dysphagia During  her prior hospitalization patient was noted to have concern for aspiration for which she  was recommended to be on heart healthy diet with thicken liquids, which we will continue   Hypomagnesemia Repletion   COPD Chronic respiratory failure with hypoxia Chronically uses 4 L nasal cannula.  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  Bowel regimen   Tachybradycardia syndrome s/p PPM   Crohn's disease Patient reports intermittently having diarrhea, but denies having any blood present. Formed stool therefore C. difficile study canceled At her outpatient colestipol  at her request   History of small bowel obstruction No signs of bowel obstruction noted at this time   Anxiety - Continue Xanax  as needed    Neuropathy - Continue gabapentin  and Lyrica    Hyperlipidemia - Continue fenofibrate  and Zetia    GERD - Continue PPI    DVT prophylaxis: apixaban  (ELIQUIS ) tablet 5 mg     Code Status: Full Code Family Communication:   Status is: Inpatient Remains inpatient appropriate because: Continue hospital stay for further management.  Will monitor off antibiotics   PT Follow up Recs:   Subjective:  Overnight had concerns of some shortness of breath requiring IV Lasix . This morning reporting of back pain requiring muscle relaxer.  I suspect this is musculoskeletal  Examination:  General exam: Appears calm and comfortable  Respiratory system: Clear to auscultation. Respiratory effort normal. Cardiovascular system: S1 & S2 heard, RRR. No JVD, murmurs, rubs, gallops or clicks. No pedal edema. Gastrointestinal system: Abdomen is nondistended, soft and nontender. No organomegaly or masses felt. Normal bowel sounds heard. Central nervous system: Alert and oriented. No focal neurological deficits. Extremities: Symmetric 5 x 5 power. Skin: No rashes, lesions or ulcers Psychiatry: Judgement and insight appear normal. Mood &  affect appropriate.                 Diet Orders (From admission, onward)     Start     Ordered   03/31/24 1249  Diet Heart Room service appropriate? Yes; Fluid consistency: Nectar Thick  Diet effective now       Question Answer Comment  Room service appropriate? Yes   Fluid consistency: Nectar Thick      03/31/24 1248            Objective: Vitals:   04/01/24 2218 04/02/24 0422 04/02/24 0735 04/02/24 0835  BP: (!) 161/87 (!) 126/56 (!) 159/78   Pulse: 77 63 72   Resp: (!) 22 18    Temp:  (!) 97.5 F (36.4 C)    TempSrc:      SpO2: 99% 96% 98% 98%  Weight:      Height:        Intake/Output Summary (Last 24 hours) at 04/02/2024 0924 Last data filed at 04/02/2024 0630 Gross per 24 hour  Intake 1040 ml  Output 100 ml  Net 940 ml   Filed Weights   03/31/24 1600  Weight: 73.8 kg    Scheduled Meds:  acidophilus  1 capsule Oral TID   apixaban   5 mg Oral BID   atenolol   25 mg Oral Daily   budesonide -glycopyrrolate -formoterol   2 puff Inhalation BID   diclofenac  Sodium  1 Application Topical BID   ezetimibe   10 mg Oral QHS   fenofibrate   160 mg Oral Daily   HYDROmorphone        lactulose   20 g Oral BID   lidocaine   2 patch Transdermal Q24H   pantoprazole   40 mg Oral Daily   phosphorus  500 mg Oral Q4H   pregabalin   50 mg Oral BID   sodium chloride  flush  3 mL Intravenous Q12H   spironolactone   12.5 mg Oral Daily   Continuous Infusions:  Nutritional status     Body mass index is 27.93 kg/m.  Data Reviewed:   CBC: Recent Labs  Lab 03/30/24 2218 04/01/24 0534 04/02/24 0036  WBC 11.4* 4.4 6.4  HGB 12.2 10.6* 10.7*  HCT 39.0 33.2* 34.0*  MCV 92.9 91.7 93.2  PLT 227 156 182   Basic Metabolic Panel: Recent Labs  Lab 03/30/24 2218 04/01/24 0534 04/02/24 0036  NA 140 140 142  K 4.0 4.2 3.8  CL 99 104 106  CO2 26 27 26   GLUCOSE 131* 104* 123*  BUN 33* 26* 20  CREATININE 1.53* 1.14* 1.00  CALCIUM 8.6* 8.5* 8.8*  MG 1.2*  --  1.9  PHOS  --   --  2.3*    GFR: Estimated Creatinine Clearance: 43.4 mL/min (by C-G formula based on SCr of 1 mg/dL). Liver Function Tests: Recent Labs  Lab 03/30/24 2218  AST 39  ALT 16  ALKPHOS 40  BILITOT 1.1  PROT 5.7*  ALBUMIN 3.9   No results for input(s): LIPASE, AMYLASE in the last 168 hours. Recent Labs  Lab 04/01/24 0928  AMMONIA 59*   Coagulation Profile: No results for input(s): INR, PROTIME in the last 168 hours. Cardiac Enzymes: No results for input(s): CKTOTAL, CKMB, CKMBINDEX, TROPONINI in the last 168 hours. BNP (last 3 results) No results for input(s): PROBNP in the last 8760 hours. HbA1C: No results for input(s): HGBA1C in the last 72 hours. CBG: Recent Labs  Lab 03/30/24 2234  GLUCAP 125*   Lipid Profile: No results for input(s): CHOL, HDL,  LDLCALC, TRIG, CHOLHDL, LDLDIRECT in the last 72 hours. Thyroid  Function Tests: Recent Labs    03/31/24 0015  TSH 1.771   Anemia Panel: No results for input(s): VITAMINB12, FOLATE, FERRITIN, TIBC, IRON, RETICCTPCT in the last 72 hours. Sepsis Labs: Recent Labs  Lab 03/31/24 0016 03/31/24 0026  PROCALCITON <0.10  --   LATICACIDVEN  --  2.3*    Recent Results (from the past 240 hours)  Blood culture (routine x 2)     Status: None (Preliminary result)   Collection Time: 03/30/24 11:09 PM   Specimen: BLOOD LEFT ARM  Result Value Ref Range Status   Specimen Description BLOOD LEFT ARM  Final   Special Requests   Final    BOTTLES DRAWN AEROBIC AND ANAEROBIC Blood Culture results may not be optimal due to an inadequate volume of blood received in culture bottles   Culture   Final    NO GROWTH 2 DAYS Performed at [facility], [address], [place], [place] [address]    Report Status PENDING  Incomplete  Blood culture (routine x 2)     Status: None (Preliminary result)   Collection Time: 03/31/24 12:16 AM   Specimen: BLOOD RIGHT HAND  Result Value Ref Range Status    Specimen Description BLOOD RIGHT HAND  Final   Special Requests   Final    BOTTLES DRAWN AEROBIC AND ANAEROBIC Blood Culture adequate volume   Culture   Final    NO GROWTH 2 DAYS Performed at [facility], [address], [place], [place] [address]    Report Status PENDING  Incomplete  Urine Culture (for pregnant, neutropenic or urologic patients or patients with an indwelling urinary catheter)     Status: Abnormal   Collection Time: 03/31/24 10:32 AM   Specimen: Urine, Clean Catch  Result Value Ref Range Status   Specimen Description URINE, CLEAN CATCH  Final   Special Requests NONE  Final   Culture (A)  Final    <10,000 COLONIES/mL INSIGNIFICANT GROWTH Performed at [facility], [address], [place], [place] [address]    Report Status 04/01/2024 FINAL  Final  Resp panel by RT-PCR (RSV, Flu A&B, Covid) Anterior Nasal Swab     Status: None   Collection Time: 03/31/24 10:32 AM   Specimen: Anterior Nasal Swab  Result Value Ref Range Status   SARS Coronavirus 2 by RT PCR NEGATIVE NEGATIVE Final   Influenza A by PCR NEGATIVE NEGATIVE Final   Influenza B by PCR NEGATIVE NEGATIVE Final    Comment: (NOTE) The Xpert Xpress SARS-CoV-2/FLU/RSV plus assay is intended as an aid in the diagnosis of influenza from Nasopharyngeal swab specimens and should not be used as a sole basis for treatment. Nasal washings and aspirates are unacceptable for Xpert Xpress SARS-CoV-2/FLU/RSV testing.  Fact Sheet for Patients: BloggerCourse.com  Fact Sheet for Healthcare Providers: SeriousBroker.it  This test is not yet approved or cleared by the [place]  FDA and has been authorized for detection and/or diagnosis of SARS-CoV-2 by FDA under an Emergency Use Authorization (EUA). This EUA will remain in effect (meaning this test can be used) for the duration of the COVID-19 declaration under Section 564(b)(1) of the Act, 21  U.S.C. section 360bbb-3(b)(1), unless the authorization is terminated or revoked.     Resp Syncytial Virus by PCR NEGATIVE NEGATIVE Final    Comment: (NOTE) Fact Sheet for Patients: BloggerCourse.com  Fact Sheet for Healthcare Providers: SeriousBroker.it  This test is not yet  approved or cleared by the [place]  FDA and has been authorized for detection and/or diagnosis of SARS-CoV-2 by FDA under an Emergency Use Authorization (EUA). This EUA will remain in effect (meaning this test can be used) for the duration of the COVID-19 declaration under Section 564(b)(1) of the Act, 21 U.S.C. section 360bbb-3(b)(1), unless the authorization is terminated or revoked.  Performed at [facility], [address] [place], [place] [address]   MRSA Next Gen by PCR, Nasal     Status: None   Collection Time: 03/31/24 11:21 PM   Specimen: Nasal Mucosa; Nasal Swab  Result Value Ref Range Status   MRSA by PCR Next Gen NOT DETECTED NOT DETECTED Final    Comment: (NOTE) The GeneXpert MRSA Assay (FDA approved for NASAL specimens only), is one component of a comprehensive MRSA colonization surveillance program. It is not intended to diagnose MRSA infection nor to guide or monitor treatment for MRSA infections. Test performance is not FDA approved in patients less than 23 years old. Performed at [facility], [address] [place], [place] [address]          Radiology Studies: DG CHEST PORT 1 VIEW Result Date: 04/01/2024 EXAM: 1 VIEW XRAY OF THE CHEST 04/01/2024 11:49:04 PM COMPARISON: 03/22/2024 CLINICAL HISTORY: Acute left flank pain; SOB (shortness of breath). Flank pain on the left side and SHOB. FINDINGS: LUNGS AND PLEURA: Mild diffuse interstitial pulmonary edema, new since prior examination in keeping with probable mild cardiogenic failure. Trace bilateral pleural effusions. HEART AND MEDIASTINUM: Cardiomegaly. Left chest  wall cardiac pacemaker with leads overlying right atrium and right ventricle. BONES AND SOFT TISSUES: No acute osseous abnormality. IMPRESSION: 1. Mild diffuse interstitial pulmonary edema, new since prior examination, in keeping with probable mild cardiogenic failure. 2. Trace bilateral pleural effusions. 3. Cardiomegaly . Electronically signed by: Dorethia Molt MD 04/01/2024 11:56 PM EDT RP Workstation: [id number]   DG Abd 2 Views Result Date: 04/01/2024 EXAM: 2 VIEW XRAY OF THE ABDOMEN 04/01/2024 11:49:04 PM COMPARISON: 08/13/2023 CLINICAL HISTORY: Acute left flank pain; SOB (shortness of breath). Flank pain on the left side and SHOB. FINDINGS: BOWEL: Nonobstructive bowel gas pattern. SOFT TISSUES: No opaque urinary calculi. Right upper quadrant cholecystectomy clips noted. Right lower quadrant bowel suture noted. BONES: No acute osseous abnormality. Cardiac pacemaker leads in place terminating in the right atrium and right ventricle. LUNGS: Interstitial thickening and reticular opacities in the lung bases, likely due to interstitial edema. IMPRESSION: 1. No acute findings. 2. Cardiomegaly and interstitial thickening and reticular opacities in the lung bases, likely due to interstitial edema. Electronically signed by: Dorethia Molt MD 04/01/2024 11:54 PM EDT RP Workstation: [id number]           LOS: 2 days   Time spent= 35 mins    Burgess JAYSON Dare, MD [facility]  If 7PM-7AM, please contact night-coverage  04/02/2024, 9:24 AM

## 2024-04-03 ENCOUNTER — Other Ambulatory Visit (HOSPITAL_COMMUNITY): Payer: Self-pay

## 2024-04-03 DIAGNOSIS — R1313 Dysphagia, pharyngeal phase: Secondary | ICD-10-CM

## 2024-04-03 DIAGNOSIS — R6510 Systemic inflammatory response syndrome (SIRS) of non-infectious origin without acute organ dysfunction: Secondary | ICD-10-CM | POA: Diagnosis not present

## 2024-04-03 DIAGNOSIS — I5030 Unspecified diastolic (congestive) heart failure: Secondary | ICD-10-CM

## 2024-04-03 DIAGNOSIS — M545 Low back pain, unspecified: Secondary | ICD-10-CM

## 2024-04-03 DIAGNOSIS — G9340 Encephalopathy, unspecified: Secondary | ICD-10-CM

## 2024-04-03 LAB — BASIC METABOLIC PANEL WITH GFR
Anion gap: 12 (ref 5–15)
BUN: 27 mg/dL — ABNORMAL HIGH (ref 8–23)
CO2: 27 mmol/L (ref 22–32)
Calcium: 8.7 mg/dL — ABNORMAL LOW (ref 8.9–10.3)
Chloride: 105 mmol/L (ref 98–111)
Creatinine, Ser: 1.5 mg/dL — ABNORMAL HIGH (ref 0.44–1.00)
GFR, Estimated: 35 mL/min — ABNORMAL LOW (ref >60)
Glucose, Bld: 109 mg/dL — ABNORMAL HIGH (ref 70–99)
Potassium: 4.6 mmol/L (ref 3.5–5.1)
Sodium: 144 mmol/L (ref 135–145)

## 2024-04-03 LAB — CBC
HCT: 32.8 % — ABNORMAL LOW (ref 36.0–46.0)
Hemoglobin: 10.2 g/dL — ABNORMAL LOW (ref 12.0–15.0)
MCH: 29.1 pg (ref 26.0–34.0)
MCHC: 31.1 g/dL (ref 30.0–36.0)
MCV: 93.4 fL (ref 80.0–100.0)
Platelets: 173 K/uL (ref 150–400)
RBC: 3.51 MIL/uL — ABNORMAL LOW (ref 3.87–5.11)
RDW: 13.9 % (ref 11.5–15.5)
WBC: 5 K/uL (ref 4.0–10.5)
nRBC: 0 % (ref 0.0–0.2)

## 2024-04-03 LAB — MAGNESIUM: Magnesium: 1.7 mg/dL (ref 1.7–2.4)

## 2024-04-03 MED ORDER — OXYCODONE HCL 5 MG PO TABS
5.0000 mg | ORAL_TABLET | Freq: Four times a day (QID) | ORAL | 0 refills | Status: AC | PRN
  Filled 2024-04-03: qty 15, 5d supply, fill #0

## 2024-04-03 MED ORDER — COLESTIPOL HCL 1 G PO TABS: 1.0000 g | ORAL_TABLET | Freq: Two times a day (BID) | ORAL | Status: AC | PRN

## 2024-04-03 NOTE — Plan of Care (Signed)
  Problem: Health Behavior/Discharge Planning: Goal: Ability to manage health-related needs will improve Outcome: Adequate for Discharge   Problem: Clinical Measurements: Goal: Ability to maintain clinical measurements within normal limits will improve Outcome: Adequate for Discharge Goal: Will remain free from infection Outcome: Adequate for Discharge Goal: Diagnostic test results will improve Outcome: Adequate for Discharge Goal: Respiratory complications will improve Outcome: Adequate for Discharge Goal: Cardiovascular complication will be avoided Outcome: Adequate for Discharge   Problem: Activity: Goal: Risk for activity intolerance will decrease Outcome: Adequate for Discharge   Problem: Nutrition: Goal: Adequate nutrition will be maintained Outcome: Adequate for Discharge   Problem: Coping: Goal: Level of anxiety will decrease Outcome: Adequate for Discharge   Problem: Elimination: Goal: Will not experience complications related to bowel motility Outcome: Adequate for Discharge Goal: Will not experience complications related to urinary retention Outcome: Adequate for Discharge

## 2024-04-03 NOTE — Progress Notes (Signed)
 SATURATION QUALIFICATIONS: (This note is used to comply with regulatory documentation for home oxygen)  Patient Saturations on Room Air at Rest = 88%  Patient Saturations on Room Air while Ambulating = 84%  Patient Saturations on 4 Liters of oxygen while Ambulating = 90%

## 2024-04-03 NOTE — Care Management Important Message (Signed)
 Important Message  Patient Details  Name: Brittany Irwin MRN: [id number] Date of Birth: Mar 06, 1941   Important Message Given:  Yes - Medicare IM     [name] 04/03/2024, 4:12 PM

## 2024-04-03 NOTE — Progress Notes (Signed)
 Mobility Specialist Progress Note:  Nurse requested Mobility Specialist to perform oxygen saturation test with pt which includes removing pt from oxygen both at rest and while ambulating.  Below are the results from that testing.     Patient Saturations on Room Air at Rest = spO2 84% O2 flow increased to 4L/min to keep SPO2 above 88%  Patient Saturations on Room Air while Ambulating = sp02 N/A   Patient Saturations on 4 Liters of oxygen while Ambulating = sp02 92%  At end of testing pt left in room on 4  Liters of oxygen.  Reported results to nurse.    Brittany Irwin Mobility Specialist  Please contact vis Secure Chat or  Rehab Office [phone]

## 2024-04-03 NOTE — Progress Notes (Signed)
 Mobility Specialist Progress Note:   04/03/24 1118  Mobility  Activity Ambulated with assistance  Level of Assistance Contact guard assist, steadying assist  Assistive Device Four wheel walker  Distance Ambulated (ft) 400 ft  Activity Response Tolerated well  Mobility Referral Yes  Mobility visit 1 Mobility  Mobility Specialist Start Time (ACUTE ONLY) 0900  Mobility Specialist Stop Time (ACUTE ONLY) 0915  Mobility Specialist Time Calculation (min) (ACUTE ONLY) 15 min   Pt received in bed agreeable to mobility. Attempted to check Spo2 on RA but d/t desat to 84% O2 flow increased to 4L/min. SPO2 WFL on 4L/min during rest of session. Returned to room w/o fault. Left on 4L/min. Left in bed w/ call bell and personal belongings in reach. All needs met. RN in room.  Thersia Minder Mobility Specialist  Please contact vis Secure Chat or  Rehab Office [phone]

## 2024-04-03 NOTE — Care Management Important Message (Signed)
 Important Message  Patient Details  Name: Brittany Irwin MRN: [id number] Date of Birth: [birth year]   Important Message Given:  Yes - Medicare IM     [name] 04/03/2024, 1:19 PM

## 2024-04-03 NOTE — TOC Transition Note (Signed)
 Transition of Care [facility]) - Discharge Note   Patient Details  Name: Brittany Irwin MRN: [id number] Date of Birth: Mar 27, 1941  Transition of Care [facility]) CM/SW Contact:  Tom-Johnson, Harvest Muskrat, RN Phone Number: 04/03/2024, 10:43 AM   Clinical Narrative:     Patient is scheduled for discharge today.  Readmission Risk Assessment done. Home health info, hospital f/u and discharge instructions on AVS. Prescriptions sent to [facility] pharmacy and patient will receive meds prior discharge. Sister, [name] to transport at discharge.  No further ICM needs noted.        Final next level of care: Home w Home Health Services Barriers to Discharge: Barriers Resolved   Patient Goals and CMS Choice Patient states their goals for this hospitalization and ongoing recovery are:: To return home CMS Medicare.gov Compare Post Acute Care list provided to:: Patient Choice offered to / list presented to : Patient      Discharge Placement                Patient to be transferred to facility by: Darin [name]      Discharge Plan and Services Additional resources added to the After Visit Summary for                  DME Arranged: N/A DME Agency: NA       HH Arranged: PT, Disease Management, RN, Nurse's Aide HH Agency: [facility] Date  [facility] Agency Contacted: 04/03/24 Time HH Agency Contacted: [id number] Representative spoke with at [facility] Agency: Darleene  Social Drivers of Health (SDOH) Interventions SDOH Screenings   Food Insecurity: No Food Insecurity (04/01/2024)  Housing: Low Risk  (04/01/2024)  Transportation Needs: No Transportation Needs (04/01/2024)  Utilities: Not At Risk (04/01/2024)  Financial Resource Strain: Low Risk  (10/14/2023)   Received from [facility]  Recent Concern: Financial Resource Strain - Medium Risk (07/27/2023)   Received from [facility]  Physical Activity: Insufficiently Active (07/27/2023)   Received from [facility]  Social Connections: Unknown  (04/01/2024)  Stress: No Stress Concern Present (07/27/2023)   Received from [facility]  Tobacco Use: Medium Risk (03/30/2024)     Readmission Risk Interventions    04/03/2024   10:40 AM 10/02/2023    2:35 PM 09/07/2023    3:27 PM  Readmission Risk Prevention Plan  Transportation Screening Complete Complete Complete  PCP or Specialist Appt within 3-5 Days   Complete  HRI or Home Care Consult  Complete Complete  Social Work Consult for Recovery Care Planning/Counseling  Complete Complete  Palliative Care Screening  Not Applicable Not Applicable  Medication Review Oceanographer) Referral to Pharmacy Complete Complete  PCP or Specialist appointment within 3-5 days of discharge Complete    HRI or Home Care Consult Complete    SW Recovery Care/Counseling Consult Complete    Palliative Care Screening Not Applicable    Skilled Nursing Facility Not Applicable

## 2024-04-03 NOTE — Care Management Important Message (Signed)
 Important Message  Patient Details  Name: Brittany Irwin MRN: [id number] Date of Birth: 01-Oct-1940   Important Message Given:  Yes - Medicare IM     [name] 04/03/2024, 1:24 PM

## 2024-04-03 NOTE — Discharge Summary (Signed)
 Physician Discharge Summary  Brittany Irwin FMW:[id number] DOB: [birth year] DOA: 03/30/2024  PCP: [name], MD  Admit date: 03/30/2024 Discharge date: 04/03/2024  Admitted From: Home Disposition: Home  Recommendations for Outpatient Follow-up:  Follow up with PCP in 1-2 weeks Resume home Lasix  and Aldactone .  Will need repeat blood work by the end of this week.  Patient is aware of this Patient is to resume home colestipol .  Prescription has not been given as patient has been prescribed by her PCP Oxycodone  prescribed.  Patient reports she already has some bowel regimen at home Continue home 4 L nasal cannula   Discharge Condition: Stable CODE STATUS: Full code Diet recommendation: Heart healthy  Brief/Interim Summary: Brief Narrative:  83 year old with history of COPD, chronic respiratory failure on 4 L nasal cannula, CHF with preserved EF, CAD, tachybradycardia syndrome status post permanent pacemaker, Crohn's disease, SBO, psoriasis, migraine, CKD stage IIIa, anxiety admitted for altered mental status with fever.  Recently admitted to the hospital  for aspiration pneumonia.  CT head is unremarkable.  CT chest abdomen pelvis shows chronic airway inflammation with emphysema.  Started on broad-spectrum antibiotics.  Infectious workup was negative therefore antibiotics were discontinued.  There was some concerns of volume overload after requiring IV fluids for dehydration and AKI.  Patient receiving IV Lasix .  Today doing significantly well therefore we will discharge patient patient will need outpatient close follow-up.   Assessment & Plan:  SIRS; resolved.  Initially noted to be febrile with elevated WBC and mild lactic acidosis.  Procalcitonin remains unremarkable, leukocytosis has resolved.  Initially received broad-spectrum antibiotics but now remains afebrile off antibiotics - COVID/flu negative, blood cultures remain unremarkable -Supportive care.    Acute congestive  heart failure with preserved ejection fraction, EF 65% Recent echocardiogram shows preserved EF.  Initially dehydrated therefore required IV fluids but later required Lasix  due to some signs of volume overload.   Acute metabolic encephalopathy CT head is unremarkable.  TSH is normal.  Slightly elevated ammonia.  Lactulose  twice daily .  Precautions  Back pain - Musculoskeletal.  Has tense muscle group in the midline low back.  Muscle relaxer and pain medication prescribed.  Also advised heating pack to this area   Acute kidney injury, resolved Cr baseline 1.2, today 1.5.  Will need to follow-up outpatient with PCP and get repeat blood work.  Unfortunately we will have to continue Lasix  and Aldactone  as she is very volume sensitive and easily has volume overloaded.  Mild fluctuation in creatinine is present acceptable range   Pharyngeal dysphagia During  her prior hospitalization patient was noted to have concern for aspiration for which she was recommended to be on heart healthy diet with thicken liquids, which we will continue   Hypomagnesemia Repletion   COPD Chronic respiratory failure with hypoxia Chronically uses 4 L nasal cannula.  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  Bowel regimen   Tachybradycardia syndrome s/p PPM   Crohn's disease Patient reports intermittently having diarrhea, but denies having any blood present. Formed stool therefore C. difficile study canceled At her outpatient colestipol  at her request   History of small bowel obstruction No signs of bowel obstruction noted at this time   Anxiety - Continue Xanax  as needed    Neuropathy - Continue gabapentin  and Lyrica    Hyperlipidemia - Continue fenofibrate  and Zetia    GERD - Continue PPI  DVT prophylaxis: apixaban  (ELIQUIS ) tablet 5 mg     Code Status: Full  Code Family Communication:   Status is: Inpatient Remains inpatient appropriate because: Continue hospital stay for further management.  Will monitor off antibiotics   PT Follow up Recs:   Subjective:  Doing much better today, back pain as mostly subsided and sob has resolved. She would like to go home today.   Examination:  General exam: Appears calm and comfortable  Respiratory system: Clear to auscultation. Respiratory effort normal. Cardiovascular system: S1 & S2 heard, RRR. No JVD, murmurs, rubs, gallops or clicks. No pedal edema. Gastrointestinal system: Abdomen is nondistended, soft and nontender. No organomegaly or masses felt. Normal bowel sounds heard. Central nervous system: Alert and oriented. No focal neurological deficits. Extremities: Symmetric 5 x 5 power. Skin: No rashes, lesions or ulcers Psychiatry: Judgement and insight appear normal. Mood & affect appropriate.    Discharge Diagnoses:  Principal Problem:   Sepsis (HCC) Active Problems:   Urinary tract infection   Acute metabolic encephalopathy   AKI (acute kidney injury) (HCC)   Dysphagia   Hypomagnesemia   COPD (chronic obstructive pulmonary disease) (HCC)   Chronic respiratory failure with hypoxia (HCC)   Essential hypertension   Chronic back pain   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)   Presence of permanent cardiac pacemaker   Crohn's disease (HCC)   Anxiety   Neuropathy   Hyperlipidemia   GERD (gastroesophageal reflux disease)      Discharge Exam: Vitals:   04/03/24 0907 04/03/24 0913  BP:    Pulse:    Resp:    Temp:    SpO2: (!) 84% 91%   Vitals:   04/03/24 0753 04/03/24 0808 04/03/24 0907 04/03/24 0913  BP:  (!) 143/73    Pulse:  66    Resp:  17    Temp:  98 F (36.7 C)    TempSrc:      SpO2: 97% 93% (!) 84% 91%  Weight:      Height:          Discharge Instructions   Allergies as of 04/03/2024       Reactions   Methylprednisolone  Other (See  Comments)   Increased BP, HR, agitation, hallucinations    Shellfish Allergy Nausea And Vomiting   All SEAFOOD   Ativan  [lorazepam ] Other (See Comments)   Psychosis   Azulfidine [sulfasalazine] Other (See Comments)   Headache    Epipen [epinephrine] Hypertension   Flagyl [metronidazole] Hives   Motrin [ibuprofen] Nausea Only, Other (See Comments)   Told not to take medication due to GI distress caused by crohn's disease.   Nsaids Other (See Comments)   Told not to take NSAIDs due to GI distress caused by crohn's disease   Penicillins Hives   12/2021, 11/2023 tolerated cephalosporins    Morphine And Codeine  Itching   Told not to take due to GI distress caused by crohn's disease        Medication List     TAKE these medications    acidophilus Caps capsule Take 1 capsule by mouth 3 (three) times daily.   ALPRAZolam  1 MG tablet Commonly known as: XANAX  Take 1 mg by mouth 2 (two) times daily.   apixaban  5 MG Tabs tablet Commonly known as: ELIQUIS  Take 5 mg by mouth 2 (two) times daily.   atenolol  25 MG tablet Commonly known as: Tenormin  Take 1 tablet (25 mg total) by mouth daily.   colestipol  1 g tablet  Commonly known as: COLESTID  Take 1 tablet (1 g total) by mouth 2 (two) times daily as needed (diarrhea).   cyclobenzaprine  5 MG tablet Commonly known as: FLEXERIL  Take 5 mg by mouth daily as needed for muscle spasms.   ezetimibe  10 MG tablet Commonly known as: ZETIA  Take 10 mg by mouth at bedtime.   fenofibrate  micronized 200 MG capsule Commonly known as: LOFIBRA Take 200 mg by mouth daily.   furosemide  40 MG tablet Commonly known as: LASIX  Take 40 mg by mouth daily.   omeprazole 40 MG capsule Commonly known as: PRILOSEC Take 40 mg by mouth daily.   oxyCODONE  5 MG immediate release tablet Commonly known as: Oxy IR/ROXICODONE  Take 1 tablet (5 mg total) by mouth every 6 (six) hours as needed for up to 5 days for moderate pain (pain score 4-6) or severe pain  (pain score 7-10). What changed: reasons to take this   pregabalin  50 MG capsule Commonly known as: LYRICA  Take 50 mg by mouth 2 (two) times daily.   spironolactone  25 MG tablet Commonly known as: Aldactone  Take 0.5 tablets (12.5 mg total) by mouth daily.   Thick-It Powd Generic drug: STARCH-MALTO DEXTRIN Make liquids nectar thick. Kindly dispense any thickening product to make liquids nectar thick consistency, 1 month supply no refills.   Trelegy Ellipta  200-62.5-25 MCG/ACT Aepb Generic drug: Fluticasone -Umeclidin-Vilant Inhale 1 puff into the lungs daily.   Voltaren  Arthritis Pain 1 % Gel Generic drug: diclofenac  Sodium Apply 1 Application topically in the morning and at bedtime.        Follow-up Information     [name], MD Follow up in 4 week(s).   Specialty: Internal Medicine Why: Need follow up and routine BMP/Mg and CBC check Contact information: [address] [place]  [address] [phone]         [facility] Follow up.   Specialty: Home Health Services Why: Someone will call you to schedule resumption of care visit. Contact information: [address] [place] [address] [phone]                Allergies  Allergen Reactions   Methylprednisolone  Other (See Comments)    Increased BP, HR, agitation, hallucinations    Shellfish Allergy Nausea And Vomiting    All SEAFOOD   Ativan  [Lorazepam ] Other (See Comments)    Psychosis   Azulfidine [Sulfasalazine] Other (See Comments)    Headache    Epipen [Epinephrine] Hypertension   Flagyl [Metronidazole] Hives   Motrin [Ibuprofen] Nausea Only and Other (See Comments)    Told not to take medication due to GI distress caused by crohn's disease.   Nsaids Other (See Comments)    Told not to take NSAIDs due to GI distress caused by crohn's disease   Penicillins Hives    12/2021, 11/2023 tolerated cephalosporins    Morphine And Codeine  Itching    Told not to  take due to GI distress caused by crohn's disease    You were cared for by a hospitalist during your hospital stay. If you have any questions about your discharge medications or the care you received while you were in the hospital after you are discharged, you can call the unit and asked to speak with the hospitalist on call if the hospitalist that took care of you is not available. Once you are discharged, your primary care physician will handle any further medical issues. Please note that no refills for any discharge medications will be authorized  once you are discharged, as it is imperative that you return to your primary care physician (or establish a relationship with a primary care physician if you do not have one) for your aftercare needs so that they can reassess your need for medications and monitor your lab values.  You were cared for by a hospitalist during your hospital stay. If you have any questions about your discharge medications or the care you received while you were in the hospital after you are discharged, you can call the unit and asked to speak with the hospitalist on call if the hospitalist that took care of you is not available. Once you are discharged, your primary care physician will handle any further medical issues. Please note that NO REFILLS for any discharge medications will be authorized once you are discharged, as it is imperative that you return to your primary care physician (or establish a relationship with a primary care physician if you do not have one) for your aftercare needs so that they can reassess your need for medications and monitor your lab values.  Please request your Prim.MD to go over all Hospital Tests and Procedure/Radiological results at the follow up, please get all Hospital records sent to your Prim MD by signing hospital release before you go home.  Get CBC, CMP, 2 view Chest X ray checked  by Primary MD during your next visit or SNF MD in 5-7 days (  we routinely change or add medications that can affect your baseline labs and fluid status, therefore we recommend that you get the mentioned basic workup next visit with your PCP, your PCP may decide not to get them or add new tests based on their clinical decision)  On your next visit with your primary care physician please Get Medicines reviewed and adjusted.  If you experience worsening of your admission symptoms, develop shortness of breath, life threatening emergency, suicidal or homicidal thoughts you must seek medical attention immediately by calling 911 or calling your MD immediately  if symptoms less severe.  You Must read complete instructions/literature along with all the possible adverse reactions/side effects for all the Medicines you take and that have been prescribed to you. Take any new Medicines after you have completely understood and accpet all the possible adverse reactions/side effects.   Do not drive, operate heavy machinery, perform activities at heights, swimming or participation in water activities or provide baby sitting services if your were admitted for syncope or siezures until you have seen by Primary MD or a Neurologist and advised to do so again.  Do not drive when taking Pain medications.   Procedures/Studies: DG CHEST PORT 1 VIEW Result Date: 04/01/2024 EXAM: 1 VIEW XRAY OF THE CHEST 04/01/2024 11:49:04 PM COMPARISON: 03/22/2024 CLINICAL HISTORY: Acute left flank pain; SOB (shortness of breath). Flank pain on the left side and SHOB. FINDINGS: LUNGS AND PLEURA: Mild diffuse interstitial pulmonary edema, new since prior examination in keeping with probable mild cardiogenic failure. Trace bilateral pleural effusions. HEART AND MEDIASTINUM: Cardiomegaly. Left chest wall cardiac pacemaker with leads overlying right atrium and right ventricle. BONES AND SOFT TISSUES: No acute osseous abnormality. IMPRESSION: 1. Mild diffuse interstitial pulmonary edema, new since prior  examination, in keeping with probable mild cardiogenic failure. 2. Trace bilateral pleural effusions. 3. Cardiomegaly . Electronically signed by: Dorethia Molt MD 04/01/2024 11:56 PM EDT RP Workstation: [id number]   DG Abd 2 Views Result Date: 04/01/2024 EXAM: 2 VIEW XRAY OF THE ABDOMEN 04/01/2024 11:49:04 PM COMPARISON: 08/13/2023 CLINICAL  HISTORY: Acute left flank pain; SOB (shortness of breath). Flank pain on the left side and SHOB. FINDINGS: BOWEL: Nonobstructive bowel gas pattern. SOFT TISSUES: No opaque urinary calculi. Right upper quadrant cholecystectomy clips noted. Right lower quadrant bowel suture noted. BONES: No acute osseous abnormality. Cardiac pacemaker leads in place terminating in the right atrium and right ventricle. LUNGS: Interstitial thickening and reticular opacities in the lung bases, likely due to interstitial edema. IMPRESSION: 1. No acute findings. 2. Cardiomegaly and interstitial thickening and reticular opacities in the lung bases, likely due to interstitial edema. Electronically signed by: Dorethia Molt MD 04/01/2024 11:54 PM EDT RP Workstation: [id number]   CT Head Wo Contrast Result Date: 03/31/2024 EXAM: CT HEAD WITHOUT CONTRAST 03/31/2024 02:22:51 AM TECHNIQUE: CT of the head was performed without the administration of intravenous contrast. Automated exposure control, iterative reconstruction, and/or weight based adjustment of the mA/kV was utilized to reduce the radiation dose to as low as reasonably achievable. COMPARISON: 02/28/2024 CLINICAL HISTORY: Mental status change, unknown cause. Mental status change; Abdominal pain, acute, nonlocalized, Cough, chronic/persisting > 8 weeks, failed empiric treatment, abdominal pain, acute, nonlocalized. FINDINGS: BRAIN AND VENTRICLES: No acute hemorrhage. No evidence of acute infarct. No hydrocephalus. No extra-axial collection. No mass effect or midline shift. ORBITS: No acute abnormality. SINUSES: No acute abnormality. SOFT TISSUES  AND SKULL: No acute soft tissue abnormality. No skull fracture. IMPRESSION: 1. No acute intracranial abnormality. Electronically signed by: Norman Gatlin MD 03/31/2024 02:38 AM EDT RP Workstation: [id number]   CT CHEST ABDOMEN PELVIS W CONTRAST Result Date: 03/31/2024 EXAM: CT CHEST, ABDOMEN AND PELVIS WITH CONTRAST 03/31/2024 02:22:51 AM TECHNIQUE: CT of the chest, abdomen and pelvis was performed with the administration of intravenous contrast (75mL iohexol  (OMNIPAQUE ) 350 MG/ML injection). Multiplanar reformatted images are provided for review. Automated exposure control, iterative reconstruction, and/or weight based adjustment of the mA/kV was utilized to reduce the radiation dose to as low as reasonably achievable. COMPARISON: CT chest 03/22/2024 CT abdomen and pelvis 02/28/2024 CLINICAL HISTORY: Abdominal pain, acute, nonlocalized; Cough, chronic/persisting > 8 weeks, failed empiric treatment, abdominal pain, acute, nonlocalized. Mental status change. FINDINGS: CHEST: MEDIASTINUM AND LYMPH NODES: Stable 12 mm subcarinal node and 10 mm pretracheal node. No pericardial effusion. LUNGS AND PLEURA: Emphysema. Diffuse bronchial wall thickening. Bibasilar atelectasis or scarring. Subtotal atelectasis of the right middle lobe. Central alveolar micronodules in the right lower lobe are similar to prior and likely due to small airway infection/inflammation. ABDOMEN AND PELVIS: LIVER: The liver is unremarkable. GALLBLADDER AND BILE DUCTS: Cholecystectomy. SPLEEN: No acute abnormality. PANCREAS: No acute abnormality. ADRENAL GLANDS: No acute abnormality. KIDNEYS, URETERS AND BLADDER: No stones in the kidneys or ureters. No hydronephrosis. No perinephric or periureteral stranding. Urinary bladder is unremarkable. GI AND BOWEL: Right hemicolectomy with anastomosis in the mid right abdomen. There is no bowel obstruction. REPRODUCTIVE ORGANS: Hysterectomy. PERITONEUM AND RETROPERITONEUM: No ascites. No free air.  VASCULATURE: Coronary artery and aortic atherosclerotic calcification. Aorta is normal in caliber. ABDOMINAL AND PELVIS LYMPH NODES: No lymphadenopathy. BONES AND SOFT TISSUES: Left chest wall pacemaker. No acute osseous abnormality. No focal soft tissue abnormality. IMPRESSION: 1. No acute findings in the chest, abdomen, and pelvis. 2. Emphysema with findings of chronic airway inflammation in the lungs. Electronically signed by: Norman Gatlin MD 03/31/2024 02:36 AM EDT RP Workstation: [id number]   CT LUMBAR SPINE WO CONTRAST Result Date: 03/25/2024 CLINICAL DATA:  Low back pain worsening today EXAM: CT LUMBAR SPINE WITHOUT CONTRAST TECHNIQUE: Multidetector CT imaging of the lumbar spine was performed  without intravenous contrast administration. Multiplanar CT image reconstructions were also generated. RADIATION DOSE REDUCTION: This exam was performed according to the departmental dose-optimization program which includes automated exposure control, adjustment of the mA and/or kV according to patient size and/or use of iterative reconstruction technique. COMPARISON:  02/28/2024 FINDINGS: Segmentation: 5 lumbar type vertebrae. Alignment: Normal. Vertebrae: There are no acute or destructive bony abnormalities. Bones are osteopenic. Paraspinal and other soft tissues: The paraspinal soft tissues appear unremarkable. Continued atherosclerosis of the abdominal aorta and its branches. Disc levels: Findings at individual levels are as follows: L1/L2: Broad-based disc bulge and mild bilateral facet hypertrophy result in mild symmetrical bilateral lateral recess and neural foraminal encroachment. L2/L3: Circumferential disc bulge with bilateral facet hypertrophy results in mild trefoil central canal stenosis and symmetrical bilateral neural foraminal encroachment. L3/L4: Circumferential disc bulge with bilateral facet and ligamentum flavum hypertrophy results in mild central canal stenosis. Moderate symmetrical bilateral  neural foraminal encroachment. L4/L5: Circumferential disc bulge with bilateral facet and ligamentum flavum hypertrophy results in mild central canal stenosis. Moderate right greater than left neural foraminal encroachment. L5/S1: Bilateral facet hypertrophy without significant compressive sequela. Reconstructed images demonstrate no additional findings. IMPRESSION: 1. No acute lumbar spine fracture. 2. Stable multilevel lumbar spondylosis and facet hypertrophy, most pronounced from L2-3 through L4-5 as above. Electronically Signed   By: Ozell Daring M.D.   On: 03/25/2024 16:00   CT Chest Wo Contrast Result Date: 03/22/2024 CLINICAL DATA:  Shortness of breath and cough.  Hypoxia. EXAM: CT CHEST WITHOUT CONTRAST TECHNIQUE: Multidetector CT imaging of the chest was performed following the standard protocol without IV contrast. RADIATION DOSE REDUCTION: This exam was performed according to the departmental dose-optimization program which includes automated exposure control, adjustment of the mA and/or kV according to patient size and/or use of iterative reconstruction technique. COMPARISON:  Chest CT dated 02/28/2024. FINDINGS: Evaluation of this exam is limited in the absence of intravenous contrast. Cardiovascular: There is no cardiomegaly or pericardial effusion. There is coronary vascular calcification and calcification of the mitral annulus. Left pectoral pacemaker device. Mild atherosclerotic calcification of the thoracic aorta. No aneurysmal dilatation. There is mildly dilated main pulmonary trunk suggestive of pulmonary hypertension. Mediastinum/Nodes: No hilar adenopathy. Subcarinal lymph node measures 9 mm in short axis. A lymph node anterior to the carina measures 12 mm short axis. The esophagus is grossly unremarkable no mediastinal fluid collection. Lungs/Pleura: Background of emphysema. Similar appearance of consolidative changes of the right middle lobe with air bronchogram, likely atelectasis.  Linear and bandlike atelectasis/scarring at the lung bases. Faint areas of ground-glass density in the right lower lobe and to a lesser degree in the right upper lobe may be chronic and sequela of prior inflammatory process or represent residual or recurrent atypical pneumonia. The previously seen 5 mm nodule in the posteromedial right lung base is not visualized on today's exam. No pleural effusion or pneumothorax. The central airways are patent. Upper Abdomen: No acute abnormality. Musculoskeletal: Osteopenia with degenerative changes. No acute osseous pathology. IMPRESSION: 1. Faint areas of ground-glass density in the right lower lobe and to a lesser degree in the right upper lobe may be chronic and sequela of prior inflammatory process or represent residual or recurrent atypical pneumonia. 2. Similar appearance of consolidative changes of the right middle lobe with air bronchogram, likely atelectasis. 3. Aortic Atherosclerosis (ICD10-I70.0) and Emphysema (ICD10-J43.9). Electronically Signed   By: Vanetta Chou M.D.   On: 03/22/2024 14:41   DG Chest 1 View Result Date: 03/22/2024 CLINICAL  DATA:  Shortness of breath, sepsis. EXAM: CHEST  1 VIEW COMPARISON:  03/03/2024 and CT chest 02/28/2024. FINDINGS: Patient is slightly rotated. Trachea is midline. Heart is enlarged, stable. Pacemaker lead tips are in the right atrium and right ventricle. Minimal streaky atelectasis in the lung bases. No pleural fluid. IMPRESSION: Minimal bibasilar streaky atelectasis. Electronically Signed   By: Newell Eke M.D.   On: 03/22/2024 10:57   ECHOCARDIOGRAM COMPLETE Result Date: 03/04/2024    ECHOCARDIOGRAM REPORT   Patient Name:   MARYTZA GRANDPRE [facility] Date of Exam: 03/04/2024 Medical Rec #:  [id number]         Height:       64.0 in Accession #:    [id number]        Weight:       163.4 lb Date of Birth:  March 20, 1941        BSA:          1.795 m Patient Age:    82 years          BP:           152/63 mmHg Patient Gender: F                  HR:           61 bpm. Exam Location:  Inpatient Procedure: 2D Echo, Cardiac Doppler and Color Doppler (Both Spectral and Color            Flow Doppler were utilized during procedure). Indications:    CHF-Acute Diastolic I50.31  History:        Patient has prior history of Echocardiogram examinations, most                 recent 10/15/2022. CHF, Previous Myocardial Infarction,                 Pacemaker, COPD and CKD, stage 3, Arrythmias:Tachycardia and                 Bradycardia, Signs/Symptoms:Hypotension and Chest Pain; Risk                 Factors:Hypertension and Dyslipidemia.  Sonographer:    Thea Norlander RCS Referring Phys: JACQUELINE PRASHANT K [facility] IMPRESSIONS  1. Left ventricular ejection fraction, by estimation, is 60 to 65%. The left ventricle has normal function. The left ventricle has no regional wall motion abnormalities. Left ventricular diastolic parameters are indeterminate.  2. Right ventricular systolic function is normal. The right ventricular size is normal.  3. Mild mitral valve regurgitation.  4. The aortic valve is tricuspid. Aortic valve regurgitation is mild.  5. The inferior vena cava is dilated in size with <50% respiratory variability, suggesting right atrial pressure of 15 mmHg. FINDINGS  Left Ventricle: Left ventricular ejection fraction, by estimation, is 60 to 65%. The left ventricle has normal function. The left ventricle has no regional wall motion abnormalities. The left ventricular internal cavity size was normal in size. There is  no left ventricular hypertrophy. Left ventricular diastolic parameters are indeterminate. Right Ventricle: The right ventricular size is normal. Right vetricular wall thickness was not assessed. Right ventricular systolic function is normal. Left Atrium: Left atrial size was normal in size. Right Atrium: Right atrial size was normal in size. Pericardium: There is no evidence of pericardial effusion. Mitral Valve: There is mild thickening of  the mitral valve leaflet(s). Mild mitral annular calcification. Mild mitral valve regurgitation. MV peak gradient, 5.9 mmHg. The mean mitral valve gradient is  2.5 mmHg. Tricuspid Valve: The tricuspid valve is normal in structure. Tricuspid valve regurgitation is mild. Aortic Valve: The aortic valve is tricuspid. Aortic valve regurgitation is mild. Aortic valve peak gradient measures 5.5 mmHg. Pulmonic Valve: The pulmonic valve was not well visualized. Pulmonic valve regurgitation is not visualized. No evidence of pulmonic stenosis. Aorta: The aortic root and ascending aorta are structurally normal, with no evidence of dilitation. Venous: The inferior vena cava is dilated in size with less than 50% respiratory variability, suggesting right atrial pressure of 15 mmHg.  LEFT VENTRICLE PLAX 2D LVIDd:         5.10 cm   Diastology LVIDs:         3.30 cm   LV e' medial:    2.68 cm/s LV PW:         0.90 cm   LV E/e' medial:  27.1 LV IVS:        1.10 cm   LV e' lateral:   3.71 cm/s LVOT diam:     2.10 cm   LV E/e' lateral: 19.6 LV SV:         78 LV SV Index:   44 LVOT Area:     3.46 cm  RIGHT VENTRICLE            IVC RV S prime:     6.83 cm/s  IVC diam: 2.10 cm TAPSE (M-mode): 1.7 cm LEFT ATRIUM             Index        RIGHT ATRIUM           Index LA diam:        4.40 cm 2.45 cm/m   RA Area:     10.30 cm LA Vol (A2C):   52.4 ml 29.19 ml/m  RA Volume:   17.40 ml  9.69 ml/m LA Vol (A4C):   48.2 ml 26.85 ml/m LA Biplane Vol: 50.0 ml 27.85 ml/m  AORTIC VALVE AV Area (Vmax): 2.79 cm AV Vmax:        117.67 cm/s AV Peak Grad:   5.5 mmHg LVOT Vmax:      94.70 cm/s LVOT Vmean:     60.600 cm/s LVOT VTI:       0.226 m  AORTA Ao Root diam: 2.90 cm Ao Asc diam:  3.50 cm MITRAL VALVE MV Area (PHT): 2.62 cm     SHUNTS MV Area VTI:   2.21 cm     Systemic VTI:  0.23 m MV Peak grad:  5.9 mmHg     Systemic Diam: 2.10 cm MV Mean grad:  2.5 mmHg MV Vmax:       1.21 m/s MV Vmean:      77.0 cm/s MV Decel Time: 289 msec MV E velocity:  72.60 cm/s MV A velocity: 120.00 cm/s MV E/A ratio:  0.60 Vina Gull MD Electronically signed by Vina Gull MD Signature Date/Time: 03/04/2024/3:46:05 PM    Final      The results of significant diagnostics from this hospitalization (including imaging, microbiology, ancillary and laboratory) are listed below for reference.     Microbiology: Recent Results (from the past 240 hours)  Blood culture (routine x 2)     Status: None (Preliminary result)   Collection Time: 03/30/24 11:09 PM   Specimen: BLOOD LEFT ARM  Result Value Ref Range Status   Specimen Description BLOOD LEFT ARM  Final   Special Requests   Final    BOTTLES DRAWN AEROBIC AND ANAEROBIC  Blood Culture results may not be optimal due to an inadequate volume of blood received in culture bottles   Culture   Final    NO GROWTH 3 DAYS Performed at [facility], [address], [place], [place] [address]    Report Status PENDING  Incomplete  Blood culture (routine x 2)     Status: None (Preliminary result)   Collection Time: 03/31/24 12:16 AM   Specimen: BLOOD RIGHT HAND  Result Value Ref Range Status   Specimen Description BLOOD RIGHT HAND  Final   Special Requests   Final    BOTTLES DRAWN AEROBIC AND ANAEROBIC Blood Culture adequate volume   Culture   Final    NO GROWTH 3 DAYS Performed at [facility], [address] [place], [place] [address]    Report Status PENDING  Incomplete  Urine Culture (for pregnant, neutropenic or urologic patients or patients with an indwelling urinary catheter)     Status: Abnormal   Collection Time: 03/31/24 10:32 AM   Specimen: Urine, Clean Catch  Result Value Ref Range Status   Specimen Description URINE, CLEAN CATCH  Final   Special Requests NONE  Final   Culture (A)  Final    <10,000 COLONIES/mL INSIGNIFICANT GROWTH Performed at [facility], [address] [place], [place] [address]    Report Status 04/01/2024 FINAL  Final  Resp panel by RT-PCR (RSV, Flu A&B,  Covid) Anterior Nasal Swab     Status: None   Collection Time: 03/31/24 10:32 AM   Specimen: Anterior Nasal Swab  Result Value Ref Range Status   SARS Coronavirus 2 by RT PCR NEGATIVE NEGATIVE Final   Influenza A by PCR NEGATIVE NEGATIVE Final   Influenza B by PCR NEGATIVE NEGATIVE Final    Comment: (NOTE) The Xpert Xpress SARS-CoV-2/FLU/RSV plus assay is intended as an aid in the diagnosis of influenza from Nasopharyngeal swab specimens and should not be used as a sole basis for treatment. Nasal washings and aspirates are unacceptable for Xpert Xpress SARS-CoV-2/FLU/RSV testing.  Fact Sheet for Patients: BloggerCourse.com  Fact Sheet for Healthcare Providers: SeriousBroker.it  This test is not yet approved or cleared by the [place]  FDA and has been authorized for detection and/or diagnosis of SARS-CoV-2 by FDA under an Emergency Use Authorization (EUA). This EUA will remain in effect (meaning this test can be used) for the duration of the COVID-19 declaration under Section 564(b)(1) of the Act, 21 U.S.C. section 360bbb-3(b)(1), unless the authorization is terminated or revoked.     Resp Syncytial Virus by PCR NEGATIVE NEGATIVE Final    Comment: (NOTE) Fact Sheet for Patients: BloggerCourse.com  Fact Sheet for Healthcare Providers: SeriousBroker.it  This test is not yet approved or cleared by the [place]  FDA and has been authorized for detection and/or diagnosis of SARS-CoV-2 by FDA under an Emergency Use Authorization (EUA). This EUA will remain in effect (meaning this test can be used) for the duration of the COVID-19 declaration under Section 564(b)(1) of the Act, 21 U.S.C. section 360bbb-3(b)(1), unless the authorization is terminated or revoked.  Performed at [facility], [address] [place], [place] [address]   MRSA Next Gen by PCR, Nasal      Status: None   Collection Time: 03/31/24 11:21 PM   Specimen: Nasal Mucosa; Nasal Swab  Result Value Ref Range Status   MRSA by PCR Next Gen NOT DETECTED NOT DETECTED Final    Comment: (NOTE) The GeneXpert MRSA  Assay (FDA approved for NASAL specimens only), is one component of a comprehensive MRSA colonization surveillance program. It is not intended to diagnose MRSA infection nor to guide or monitor treatment for MRSA infections. Test performance is not FDA approved in patients less than 45 years old. Performed at [facility], [address], [place], [place] [address]      Labs: BNP (last 3 results) Recent Labs    03/03/24 0831 03/22/24 0923 04/02/24 0036  BNP 1,045.8* 402.3* 779.2*   Basic Metabolic Panel: Recent Labs  Lab 03/30/24 2218 04/01/24 0534 04/02/24 0036 04/03/24 0459  NA 140 140 142 144  K 4.0 4.2 3.8 4.6  CL 99 104 106 105  CO2 26 27 26 27   GLUCOSE 131* 104* 123* 109*  BUN 33* 26* 20 27*  CREATININE 1.53* 1.14* 1.00 1.50*  CALCIUM 8.6* 8.5* 8.8* 8.7*  MG 1.2*  --  1.9 1.7  PHOS  --   --  2.3*  --    Liver Function Tests: Recent Labs  Lab 03/30/24 2218  AST 39  ALT 16  ALKPHOS 40  BILITOT 1.1  PROT 5.7*  ALBUMIN 3.9   No results for input(s): LIPASE, AMYLASE in the last 168 hours. Recent Labs  Lab 04/01/24 0928  AMMONIA 59*   CBC: Recent Labs  Lab 03/30/24 2218 04/01/24 0534 04/02/24 0036 04/03/24 0459  WBC 11.4* 4.4 6.4 5.0  HGB 12.2 10.6* 10.7* 10.2*  HCT 39.0 33.2* 34.0* 32.8*  MCV 92.9 91.7 93.2 93.4  PLT 227 156 182 173   Cardiac Enzymes: No results for input(s): CKTOTAL, CKMB, CKMBINDEX, TROPONINI in the last 168 hours. BNP: Invalid input(s): POCBNP CBG: Recent Labs  Lab 03/30/24 2234  GLUCAP 125*   D-Dimer No results for input(s): DDIMER in the last 72 hours. Hgb A1c No results for input(s): HGBA1C in the last 72 hours. Lipid Profile No results for input(s): CHOL, HDL, LDLCALC,  TRIG, CHOLHDL, LDLDIRECT in the last 72 hours. Thyroid  function studies No results for input(s): TSH, T4TOTAL, T3FREE, THYROIDAB in the last 72 hours.  Invalid input(s): FREET3 Anemia work up No results for input(s): VITAMINB12, FOLATE, FERRITIN, TIBC, IRON, RETICCTPCT in the last 72 hours. Urinalysis    Component Value Date/Time   COLORURINE YELLOW 03/31/2024 0531   APPEARANCEUR CLEAR 03/31/2024 0531   LABSPEC 1.035 (H) 03/31/2024 0531   PHURINE 5.0 03/31/2024 0531   GLUCOSEU NEGATIVE 03/31/2024 0531   HGBUR NEGATIVE 03/31/2024 0531   BILIRUBINUR NEGATIVE 03/31/2024 0531   KETONESUR NEGATIVE 03/31/2024 0531   PROTEINUR NEGATIVE 03/31/2024 0531   NITRITE NEGATIVE 03/31/2024 0531   LEUKOCYTESUR MODERATE (A) 03/31/2024 0531   Sepsis Labs Recent Labs  Lab 03/30/24 2218 04/01/24 0534 04/02/24 0036 04/03/24 0459  WBC 11.4* 4.4 6.4 5.0   Microbiology Recent Results (from the past 240 hours)  Blood culture (routine x 2)     Status: None (Preliminary result)   Collection Time: 03/30/24 11:09 PM   Specimen: BLOOD LEFT ARM  Result Value Ref Range Status   Specimen Description BLOOD LEFT ARM  Final   Special Requests   Final    BOTTLES DRAWN AEROBIC AND ANAEROBIC Blood Culture results may not be optimal due to an inadequate volume of blood received in culture bottles   Culture   Final    NO GROWTH 3 DAYS Performed at [facility], [address], [place], [place] [address]    Report Status PENDING  Incomplete  Blood culture (routine x  2)     Status: None (Preliminary result)   Collection Time: 03/31/24 12:16 AM   Specimen: BLOOD RIGHT HAND  Result Value Ref Range Status   Specimen Description BLOOD RIGHT HAND  Final   Special Requests   Final    BOTTLES DRAWN AEROBIC AND ANAEROBIC Blood Culture adequate volume   Culture   Final    NO GROWTH 3 DAYS Performed at [facility], [address], [place], [place] [address]    Report Status  PENDING  Incomplete  Urine Culture (for pregnant, neutropenic or urologic patients or patients with an indwelling urinary catheter)     Status: Abnormal   Collection Time: 03/31/24 10:32 AM   Specimen: Urine, Clean Catch  Result Value Ref Range Status   Specimen Description URINE, CLEAN CATCH  Final   Special Requests NONE  Final   Culture (A)  Final    <10,000 COLONIES/mL INSIGNIFICANT GROWTH Performed at [facility], [address] [place], [place] [address]    Report Status 04/01/2024 FINAL  Final  Resp panel by RT-PCR (RSV, Flu A&B, Covid) Anterior Nasal Swab     Status: None   Collection Time: 03/31/24 10:32 AM   Specimen: Anterior Nasal Swab  Result Value Ref Range Status   SARS Coronavirus 2 by RT PCR NEGATIVE NEGATIVE Final   Influenza A by PCR NEGATIVE NEGATIVE Final   Influenza B by PCR NEGATIVE NEGATIVE Final    Comment: (NOTE) The Xpert Xpress SARS-CoV-2/FLU/RSV plus assay is intended as an aid in the diagnosis of influenza from Nasopharyngeal swab specimens and should not be used as a sole basis for treatment. Nasal washings and aspirates are unacceptable for Xpert Xpress SARS-CoV-2/FLU/RSV testing.  Fact Sheet for Patients: BloggerCourse.com  Fact Sheet for Healthcare Providers: SeriousBroker.it  This test is not yet approved or cleared by the [place]  FDA and has been authorized for detection and/or diagnosis of SARS-CoV-2 by FDA under an Emergency Use Authorization (EUA). This EUA will remain in effect (meaning this test can be used) for the duration of the COVID-19 declaration under Section 564(b)(1) of the Act, 21 U.S.C. section 360bbb-3(b)(1), unless the authorization is terminated or revoked.     Resp Syncytial Virus by PCR NEGATIVE NEGATIVE Final    Comment: (NOTE) Fact Sheet for Patients: BloggerCourse.com  Fact Sheet for Healthcare  Providers: SeriousBroker.it  This test is not yet approved or cleared by the [place]  FDA and has been authorized for detection and/or diagnosis of SARS-CoV-2 by FDA under an Emergency Use Authorization (EUA). This EUA will remain in effect (meaning this test can be used) for the duration of the COVID-19 declaration under Section 564(b)(1) of the Act, 21 U.S.C. section 360bbb-3(b)(1), unless the authorization is terminated or revoked.  Performed at [facility], [address] [place], [place] [address]   MRSA Next Gen by PCR, Nasal     Status: None   Collection Time: 03/31/24 11:21 PM   Specimen: Nasal Mucosa; Nasal Swab  Result Value Ref Range Status   MRSA by PCR Next Gen NOT DETECTED NOT DETECTED Final    Comment: (NOTE) The GeneXpert MRSA Assay (FDA approved for NASAL specimens only), is one component of a comprehensive MRSA colonization surveillance program. It is not intended to diagnose MRSA infection nor to guide or monitor treatment for MRSA infections. Test performance is not FDA approved in patients less than 61 years old. Performed at [facility], [address],  [place], [place] [address]      Time coordinating discharge:  I have spent 35 minutes face to face with the patient and on the ward discussing the patients care, assessment, plan and disposition with other care givers. >50% of the time was devoted counseling the patient about the risks and benefits of treatment/Discharge disposition and coordinating care.   SIGNED:   Burgess JAYSON Dare, MD  [facility] 04/03/2024, 1:14 PM   If 7PM-7AM, please contact night-coverage

## 2024-04-05 LAB — CULTURE, BLOOD (ROUTINE X 2)
Culture: NO GROWTH
Culture: NO GROWTH
Special Requests: ADEQUATE

## 2024-04-07 ENCOUNTER — Emergency Department (HOSPITAL_COMMUNITY)
Admission: EM | Admit: 2024-04-07 | Discharge: 2024-04-07 | Disposition: A | Discharge status: Home / Self Care | Attending: Emergency Medicine | Admitting: Emergency Medicine

## 2024-04-07 ENCOUNTER — Emergency Department (HOSPITAL_COMMUNITY)

## 2024-04-07 ENCOUNTER — Other Ambulatory Visit: Payer: Self-pay

## 2024-04-07 ENCOUNTER — Encounter (HOSPITAL_COMMUNITY): Payer: Self-pay

## 2024-04-07 DIAGNOSIS — I509 Heart failure, unspecified: Secondary | ICD-10-CM | POA: Diagnosis not present

## 2024-04-07 DIAGNOSIS — Z7901 Long term (current) use of anticoagulants: Secondary | ICD-10-CM | POA: Insufficient documentation

## 2024-04-07 DIAGNOSIS — J449 Chronic obstructive pulmonary disease, unspecified: Secondary | ICD-10-CM | POA: Diagnosis not present

## 2024-04-07 DIAGNOSIS — I110 Hypertensive heart disease with heart failure: Secondary | ICD-10-CM | POA: Insufficient documentation

## 2024-04-07 DIAGNOSIS — R0602 Shortness of breath: Secondary | ICD-10-CM | POA: Diagnosis present

## 2024-04-07 DIAGNOSIS — Z7951 Long term (current) use of inhaled steroids: Secondary | ICD-10-CM | POA: Insufficient documentation

## 2024-04-07 LAB — BASIC METABOLIC PANEL WITH GFR
Anion gap: 10 (ref 5–15)
BUN: 23 mg/dL (ref 8–23)
CO2: 27 mmol/L (ref 22–32)
Calcium: 8.7 mg/dL — ABNORMAL LOW (ref 8.9–10.3)
Chloride: 105 mmol/L (ref 98–111)
Creatinine, Ser: 1.35 mg/dL — ABNORMAL HIGH (ref 0.44–1.00)
GFR, Estimated: 39 mL/min — ABNORMAL LOW (ref >60)
Glucose, Bld: 109 mg/dL — ABNORMAL HIGH (ref 70–99)
Potassium: 3.9 mmol/L (ref 3.5–5.1)
Sodium: 142 mmol/L (ref 135–145)

## 2024-04-07 LAB — CBC
HCT: 35.6 % — ABNORMAL LOW (ref 36.0–46.0)
Hemoglobin: 10.9 g/dL — ABNORMAL LOW (ref 12.0–15.0)
MCH: 28.8 pg (ref 26.0–34.0)
MCHC: 30.6 g/dL (ref 30.0–36.0)
MCV: 94.2 fL (ref 80.0–100.0)
Platelets: 211 K/uL (ref 150–400)
RBC: 3.78 MIL/uL — ABNORMAL LOW (ref 3.87–5.11)
RDW: 13.9 % (ref 11.5–15.5)
WBC: 6.9 K/uL (ref 4.0–10.5)
nRBC: 0 % (ref 0.0–0.2)

## 2024-04-07 LAB — URINALYSIS, ROUTINE W REFLEX MICROSCOPIC
Bilirubin Urine: NEGATIVE
Glucose, UA: NEGATIVE mg/dL
Ketones, ur: NEGATIVE mg/dL
Nitrite: NEGATIVE
Protein, ur: NEGATIVE mg/dL
Specific Gravity, Urine: 1.008 (ref 1.005–1.030)
pH: 5 (ref 5.0–8.0)

## 2024-04-07 LAB — BRAIN NATRIURETIC PEPTIDE: B Natriuretic Peptide: 575.9 pg/mL — ABNORMAL HIGH (ref 0.0–100.0)

## 2024-04-07 MED ORDER — FUROSEMIDE 20 MG PO TABS: 20.0000 mg | ORAL_TABLET | Freq: Every day | ORAL | 0 refills | Status: DC

## 2024-04-07 MED ORDER — FUROSEMIDE 10 MG/ML IJ SOLN
40.0000 mg | Freq: Once | INTRAMUSCULAR | Status: AC
  Administered 2024-04-07: 40 mg via INTRAVENOUS
  Filled 2024-04-07: qty 4

## 2024-04-07 MED ORDER — HYDROMORPHONE HCL 1 MG/ML IJ SOLN
0.5000 mg | Freq: Once | INTRAMUSCULAR | Status: AC
  Administered 2024-04-07: 0.5 mg via INTRAVENOUS
  Filled 2024-04-07: qty 1

## 2024-04-07 NOTE — ED Provider Notes (Signed)
  EMERGENCY DEPARTMENT AT [facility] Provider Note   CSN: [id number] Arrival date & time: 04/07/24  1447     Patient presents with: Shortness of Breath   Brittany Irwin is a 83 y.o. female history of heart failure, hypertension, recent pneumonia, COPD on 4 L nasal cannula, Crohn's disease here presenting with leg swelling and shortness of breath.  Patient was just admitted for pneumonia.  Patient was discharged 4 days ago.  Patient states that since last night she noticed that her legs are more swollen than usual.  Patient also has slightly worsening increased shortness of breath.  Patient states that she called her doctors and was sent here for further evaluation.  Patient states that she has some urinary frequency.  She states that she is compliant with her Lasix  40 mg daily and does take spironolactone  12.5 mg.   The history is provided by the patient.       Prior to Admission medications   Medication Sig Start Date End Date Taking? Authorizing Provider  acidophilus (RISAQUAD) CAPS capsule Take 1 capsule by mouth 3 (three) times daily. 03/26/24   Cherlyn Labella, MD  ALPRAZolam  (XANAX ) 1 MG tablet Take 1 mg by mouth 2 (two) times daily. 01/02/22   [provider]  apixaban  (ELIQUIS ) 5 MG TABS tablet Take 5 mg by mouth 2 (two) times daily.    [provider]  atenolol  (TENORMIN ) 25 MG tablet Take 1 tablet (25 mg total) by mouth daily. 02/16/24   Sherrill Cable Latif, DO  colestipol  (COLESTID ) 1 g tablet Take 1 tablet (1 g total) by mouth 2 (two) times daily as needed (diarrhea). 04/03/24   Amin, Ankit C, MD  cyclobenzaprine  (FLEXERIL ) 5 MG tablet Take 5 mg by mouth daily as needed for muscle spasms. 10/13/22   [provider]  diclofenac  Sodium (VOLTAREN  ARTHRITIS PAIN) 1 % GEL Apply 1 Application topically in the morning and at bedtime.    [provider]  ezetimibe  (ZETIA ) 10 MG tablet Take 10 mg by mouth at bedtime. 12/31/21    [provider]  fenofibrate  micronized (LOFIBRA) 200 MG capsule Take 200 mg by mouth daily. 12/31/21   [provider]  Fluticasone -Umeclidin-Vilant (TRELEGY ELLIPTA ) 200-62.5-25 MCG/ACT AEPB Inhale 1 puff into the lungs daily. 12/22/23     furosemide  (LASIX ) 40 MG tablet Take 40 mg by mouth daily.    [provider]  omeprazole (PRILOSEC) 40 MG capsule Take 40 mg by mouth daily.    [provider]  oxyCODONE  (OXY IR/ROXICODONE ) 5 MG immediate release tablet Take 1 tablet (5 mg total) by mouth every 6 (six) hours as needed for up to 5 days for moderate pain (pain score 4-6) or severe pain (pain score 7-10). 04/03/24 04/08/24  Amin, Ankit C, MD  pregabalin  (LYRICA ) 50 MG capsule Take 50 mg by mouth 2 (two) times daily.    [provider]  spironolactone  (ALDACTONE ) 25 MG tablet Take 0.5 tablets (12.5 mg total) by mouth daily. 03/05/24 04/04/24  Dennise Lavada POUR, MD  STARCH-[facility] DEXTRIN [facility]) POWD Make liquids nectar thick. Kindly dispense any thickening product to make liquids nectar thick consistency, 1 month supply no refills. 03/05/24   Singh, Prashant K, MD    Allergies: Methylprednisolone , Shellfish allergy, Ativan  [lorazepam ], Azulfidine [sulfasalazine], Epipen [epinephrine], Flagyl [metronidazole], Motrin [ibuprofen], Nsaids, Penicillins, and Morphine and codeine     Review of Systems  Respiratory:  Positive for shortness of breath.   All other systems reviewed and are  negative.   Updated Vital Signs BP (!) 147/134   Pulse 86   Temp 97.8 F (36.6 C)   Resp 14   Ht 5' 4 (1.626 m)   Wt 74.8 kg   SpO2 99%   BMI 28.32 kg/m   Physical Exam Vitals and nursing note reviewed.  Constitutional:      Comments: Chronically ill  HENT:     Head: Normocephalic.     Mouth/Throat:     Mouth: Mucous membranes are moist.  Eyes:     Extraocular Movements: Extraocular movements intact.     Pupils: Pupils are equal, round, and reactive to light.   Cardiovascular:     Rate and Rhythm: Normal rate and regular rhythm.  Pulmonary:     Comments: Slightly tachypneic and diminished bilateral bases Musculoskeletal:     Cervical back: Normal range of motion and neck supple.     Comments: 1+ edema bilateral legs  Skin:    General: Skin is warm.     Capillary Refill: Capillary refill takes less than 2 seconds.  Neurological:     General: No focal deficit present.     Mental Status: She is oriented to person, place, and time.  Psychiatric:        Mood and Affect: Mood normal.        Behavior: Behavior normal.     (all labs ordered are listed, but only abnormal results are displayed) Labs Reviewed  BASIC METABOLIC PANEL WITH GFR - Abnormal; Notable for the following components:      Result Value   Glucose, Bld 109 (*)    Creatinine, Ser 1.35 (*)    Calcium 8.7 (*)    GFR, Estimated 39 (*)    All other components within normal limits  CBC - Abnormal; Notable for the following components:   RBC 3.78 (*)    Hemoglobin 10.9 (*)    HCT 35.6 (*)    All other components within normal limits  BRAIN NATRIURETIC PEPTIDE - Abnormal; Notable for the following components:   B Natriuretic Peptide 575.9 (*)    All other components within normal limits  URINALYSIS, ROUTINE W REFLEX MICROSCOPIC    EKG: EKG Interpretation Date/Time:  Friday April 07 2024 15:22:44 EDT Ventricular Rate:  62 PR Interval:  248 QRS Duration:  168 QT Interval:  498 QTC Calculation: 505 R Axis:   204  Text Interpretation: AV dual-paced rhythm with prolonged AV conduction Abnormal ECG When compared with ECG of 30-Mar-2024 23:40, PREVIOUS ECG IS PRESENT Confirmed by Patt Alm DEL [phone]) on 04/07/2024 5:58:03 PM  Radiology: [facility] Chest 2 View Result Date: 04/07/2024 CLINICAL DATA:  Shortness of breath, bilateral foot swelling since yesterday. EXAM: CHEST - 2 VIEW COMPARISON:  April 01, 2024. FINDINGS: Stable cardiomediastinal silhouette. 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 Decreased bibasilar opacities are noted suggesting improving atelectasis or edema. The visualized skeletal structures are unremarkable. IMPRESSION: Decreased bibasilar opacities as described above. Electronically Signed   By: Lynwood Landy Raddle M.D.   On: 04/07/2024 15:47     Procedures   Angiocath insertion Performed by: Alm DEL Patt  Consent: Verbal consent obtained. Risks and benefits: risks, benefits and alternatives were discussed Time out: Immediately prior to procedure a time out was called to verify the correct patient, procedure, equipment, support staff and site/side marked as required.  Preparation: Patient was prepped and draped in the usual sterile fashion.  Vein Location: L forearm   Ultrasound Guided  Gauge: 20 long  Normal blood return and flush without difficulty Patient tolerance: Patient tolerated the procedure well with no immediate complications.    Medications Ordered in the ED  HYDROmorphone  (DILAUDID ) injection 0.5 mg (has no administration in time range)  furosemide  (LASIX ) injection 40 mg (40 mg Intravenous Given 04/07/24 1729)                                    Medical Decision Making CHALET KERWIN is a 83 y.o. female here presenting with shortness of breath and leg swelling.  Patient has history of CHF and I think likely CHF exacerbation.  Also consider recurrent pneumonia as well.  Will get CBC and BMP and BNP and chest x-ray.  7:18 PM Reviewed patient's labs and BNP is 575.  During last admission, her BNP was over 700.  Creatinine is 1.35 which is improved from 1.5.  Chest x-ray showed decreased bibasilar opacities.  Patient is doing well on her baseline 4 L nasal cannula.  Patient urinated about 1 L after given 40 mg of IV Lasix .  She is feeling better now.  At this point I think she is stable for discharge.  Will add 20 mg daily for 3 days.  I told her to also continue her 40 mg of Lasix  daily.  In total she will be taking 60 mg  for the next 3 days.  She has follow-up with PCP on Tuesday and recommend that PCP recheck her kidney function at that time.  Problems Addressed: Acute on chronic congestive heart failure, unspecified heart failure type [facility]): acute illness or injury  Amount and/or Complexity of Data Reviewed Labs: ordered. Decision-making details documented in ED Course. Radiology: ordered and independent interpretation performed. Decision-making details documented in ED Course.  Risk Prescription drug management.     Final diagnoses:  None    ED Discharge Orders     None          Patt Alm Macho, MD 04/07/24 1919

## 2024-04-07 NOTE — ED Notes (Signed)
 The pt is feeling better and she has urinated  515m/ urine

## 2024-04-07 NOTE — ED Provider Triage Note (Signed)
 Emergency Medicine Provider Triage Evaluation Note  NEVELYN MELLOTT , a 83 y.o. female  was evaluated in triage.  Pt complains of fluid retention. Pt report she was hospitalized a week ago for confusion.  Was found to have pneumonia.  Pt also was having fluid overload and kidney impairment.  D/c home and was told to hold off her diuretic.  She is reporting increase fluid retention to her legs and sob.  Pcp recommend going to the ER.  Denies fever, chills, chest pain, cough  Review of Systems  Positive: As above Negative: As above  Physical Exam  BP (!) 103/59 (BP Location: Left Arm)   Pulse 69   Temp 97.8 F (36.6 C)   Resp 19   Ht 5' 4 (1.626 m)   Wt 74.8 kg   SpO2 90%   BMI 28.32 kg/m  Gen:   Awake, no distress   Resp:  Normal effort  MSK:   Moves extremities without difficulty  Other:    Medical Decision Making  Medically screening exam initiated at 3:17 PM.  Appropriate orders placed.  Holli JONETTA Busby was informed that the remainder of the evaluation will be completed by another provider, this initial triage assessment does not replace that evaluation, and the importance of remaining in the ED until their evaluation is complete.     Nivia Colon, PA-C 04/07/24 [phone]

## 2024-04-07 NOTE — ED Triage Notes (Signed)
 Pt states bilateral feet swelling since yesterday, hx of CHF. Pt wears 4 L of O2 24/7. Denies CP and c/o SHOB.

## 2024-04-07 NOTE — ED Notes (Signed)
 The pts feet and ankles are swelling since last pm  no chest pain some sob on home 02  at 4 liters  02 continued

## 2024-04-07 NOTE — Discharge Instructions (Addendum)
 As we discussed, you have mild heart failure exacerbation  I recommend continue taking your Lasix  40 mg in the morning.  I have prescribed Lasix  20 mg to be taken at night for the next 3 days  You need to follow-up with your primary care doctor on Tuesday as scheduled.  During that visit, I recommend you repeat a kidney function test  Return to ER if you have worse shortness of breath or chest pain or leg swelling

## 2024-04-07 NOTE — ED Notes (Signed)
 Unable to  give lasix  at present no iv iv team consulted

## 2024-05-01 ENCOUNTER — Other Ambulatory Visit (HOSPITAL_COMMUNITY): Payer: Self-pay

## 2024-05-26 ENCOUNTER — Other Ambulatory Visit (HOSPITAL_COMMUNITY): Payer: Self-pay

## 2024-05-30 ENCOUNTER — Other Ambulatory Visit: Payer: Self-pay

## 2024-05-30 ENCOUNTER — Observation Stay (HOSPITAL_COMMUNITY)
Admission: EM | Admit: 2024-05-30 | Discharge: 2024-06-01 | Disposition: A | Discharge status: Home / Self Care | Attending: Internal Medicine | Admitting: Internal Medicine

## 2024-05-30 ENCOUNTER — Encounter (HOSPITAL_COMMUNITY): Payer: Self-pay

## 2024-05-30 ENCOUNTER — Emergency Department (HOSPITAL_COMMUNITY)

## 2024-05-30 DIAGNOSIS — Z950 Presence of cardiac pacemaker: Secondary | ICD-10-CM | POA: Diagnosis not present

## 2024-05-30 DIAGNOSIS — R7989 Other specified abnormal findings of blood chemistry: Secondary | ICD-10-CM | POA: Insufficient documentation

## 2024-05-30 DIAGNOSIS — M48062 Spinal stenosis, lumbar region with neurogenic claudication: Secondary | ICD-10-CM | POA: Insufficient documentation

## 2024-05-30 DIAGNOSIS — R079 Chest pain, unspecified: Principal | ICD-10-CM | POA: Diagnosis present

## 2024-05-30 DIAGNOSIS — N1831 Chronic kidney disease, stage 3a: Secondary | ICD-10-CM | POA: Insufficient documentation

## 2024-05-30 DIAGNOSIS — I5032 Chronic diastolic (congestive) heart failure: Secondary | ICD-10-CM | POA: Diagnosis not present

## 2024-05-30 DIAGNOSIS — I495 Sick sinus syndrome: Secondary | ICD-10-CM | POA: Diagnosis not present

## 2024-05-30 DIAGNOSIS — I5030 Unspecified diastolic (congestive) heart failure: Secondary | ICD-10-CM | POA: Insufficient documentation

## 2024-05-30 DIAGNOSIS — Z87891 Personal history of nicotine dependence: Secondary | ICD-10-CM | POA: Insufficient documentation

## 2024-05-30 DIAGNOSIS — I4891 Unspecified atrial fibrillation: Secondary | ICD-10-CM | POA: Diagnosis not present

## 2024-05-30 DIAGNOSIS — K219 Gastro-esophageal reflux disease without esophagitis: Secondary | ICD-10-CM | POA: Diagnosis not present

## 2024-05-30 DIAGNOSIS — I700 Atherosclerosis of aorta: Secondary | ICD-10-CM | POA: Insufficient documentation

## 2024-05-30 DIAGNOSIS — E785 Hyperlipidemia, unspecified: Secondary | ICD-10-CM | POA: Insufficient documentation

## 2024-05-30 DIAGNOSIS — J449 Chronic obstructive pulmonary disease, unspecified: Principal | ICD-10-CM | POA: Insufficient documentation

## 2024-05-30 DIAGNOSIS — Z7901 Long term (current) use of anticoagulants: Secondary | ICD-10-CM | POA: Diagnosis not present

## 2024-05-30 DIAGNOSIS — I2510 Atherosclerotic heart disease of native coronary artery without angina pectoris: Secondary | ICD-10-CM | POA: Insufficient documentation

## 2024-05-30 DIAGNOSIS — I10 Essential (primary) hypertension: Secondary | ICD-10-CM

## 2024-05-30 DIAGNOSIS — I130 Hypertensive heart and chronic kidney disease with heart failure and stage 1 through stage 4 chronic kidney disease, or unspecified chronic kidney disease: Secondary | ICD-10-CM | POA: Insufficient documentation

## 2024-05-30 LAB — BASIC METABOLIC PANEL WITH GFR
Anion gap: 13 (ref 5–15)
BUN: 24 mg/dL — ABNORMAL HIGH (ref 8–23)
CO2: 27 mmol/L (ref 22–32)
Calcium: 9.2 mg/dL (ref 8.9–10.3)
Chloride: 99 mmol/L (ref 98–111)
Creatinine, Ser: 1.4 mg/dL — ABNORMAL HIGH (ref 0.44–1.00)
GFR, Estimated: 38 mL/min — ABNORMAL LOW (ref >60)
Glucose, Bld: 107 mg/dL — ABNORMAL HIGH (ref 70–99)
Potassium: 5 mmol/L (ref 3.5–5.1)
Sodium: 139 mmol/L (ref 135–145)

## 2024-05-30 LAB — CBC
HCT: 42.5 % (ref 36.0–46.0)
Hemoglobin: 13.4 g/dL (ref 12.0–15.0)
MCH: 28.7 pg (ref 26.0–34.0)
MCHC: 31.5 g/dL (ref 30.0–36.0)
MCV: 91 fL (ref 80.0–100.0)
Platelets: 219 K/uL (ref 150–400)
RBC: 4.67 MIL/uL (ref 3.87–5.11)
RDW: 14.9 % (ref 11.5–15.5)
WBC: 9.5 K/uL (ref 4.0–10.5)
nRBC: 0 % (ref 0.0–0.2)

## 2024-05-30 LAB — TROPONIN I (HIGH SENSITIVITY)
Troponin I (High Sensitivity): 173 ng/L (ref <18)
Troponin I (High Sensitivity): 138 ng/L (ref <18)

## 2024-05-30 MED ORDER — EZETIMIBE 10 MG PO TABS
10.0000 mg | ORAL_TABLET | Freq: Every day | ORAL | Status: DC
  Administered 2024-05-30 – 2024-05-31 (×2): 10 mg via ORAL
  Filled 2024-05-30 (×2): qty 1

## 2024-05-30 MED ORDER — BUDESON-GLYCOPYRROL-FORMOTEROL 160-9-4.8 MCG/ACT IN AERO
2.0000 | INHALATION_SPRAY | Freq: Two times a day (BID) | RESPIRATORY_TRACT | Status: DC
  Administered 2024-05-31 – 2024-06-01 (×3): 2 via RESPIRATORY_TRACT
  Filled 2024-05-30: qty 5.9

## 2024-05-30 MED ORDER — SPIRONOLACTONE 12.5 MG HALF TABLET
12.5000 mg | ORAL_TABLET | Freq: Every day | ORAL | Status: DC
  Administered 2024-05-31 – 2024-06-01 (×2): 12.5 mg via ORAL
  Filled 2024-05-30 (×2): qty 1

## 2024-05-30 MED ORDER — ACETAMINOPHEN 500 MG PO TABS
1000.0000 mg | ORAL_TABLET | ORAL | Status: AC
  Administered 2024-05-30: 1000 mg via ORAL
  Filled 2024-05-30: qty 2

## 2024-05-30 MED ORDER — ALPRAZOLAM 0.5 MG PO TABS
1.0000 mg | ORAL_TABLET | Freq: Two times a day (BID) | ORAL | Status: DC
  Administered 2024-05-31 – 2024-06-01 (×3): 1 mg via ORAL
  Filled 2024-05-30: qty 2
  Filled 2024-05-30 (×2): qty 4
  Filled 2024-05-30: qty 2

## 2024-05-30 MED ORDER — PANTOPRAZOLE SODIUM 40 MG PO TBEC
40.0000 mg | DELAYED_RELEASE_TABLET | Freq: Every day | ORAL | Status: DC
  Administered 2024-05-31 – 2024-06-01 (×2): 40 mg via ORAL
  Filled 2024-05-30 (×2): qty 1

## 2024-05-30 MED ORDER — CYCLOBENZAPRINE HCL 10 MG PO TABS
5.0000 mg | ORAL_TABLET | Freq: Every day | ORAL | Status: DC | PRN
  Administered 2024-05-30 – 2024-06-01 (×3): 5 mg via ORAL
  Filled 2024-05-30 (×3): qty 1

## 2024-05-30 MED ORDER — PREGABALIN 50 MG PO CAPS
50.0000 mg | ORAL_CAPSULE | Freq: Two times a day (BID) | ORAL | Status: DC
  Administered 2024-05-30 – 2024-06-01 (×4): 50 mg via ORAL
  Filled 2024-05-30: qty 1
  Filled 2024-05-30: qty 2
  Filled 2024-05-30: qty 1
  Filled 2024-05-30: qty 2

## 2024-05-30 MED ORDER — ACETAMINOPHEN 325 MG PO TABS
650.0000 mg | ORAL_TABLET | Freq: Four times a day (QID) | ORAL | Status: DC | PRN
  Administered 2024-05-30 – 2024-06-01 (×4): 650 mg via ORAL
  Filled 2024-05-30 (×4): qty 2

## 2024-05-30 MED ORDER — ACETAMINOPHEN 650 MG RE SUPP: 650.0000 mg | Freq: Four times a day (QID) | RECTAL | Status: DC | PRN

## 2024-05-30 MED ORDER — APIXABAN 5 MG PO TABS
5.0000 mg | ORAL_TABLET | Freq: Two times a day (BID) | ORAL | Status: DC
  Administered 2024-05-30: 5 mg via ORAL
  Filled 2024-05-30: qty 1

## 2024-05-30 MED ORDER — FENOFIBRATE 160 MG PO TABS
160.0000 mg | ORAL_TABLET | Freq: Every day | ORAL | Status: DC
  Administered 2024-05-31 – 2024-06-01 (×2): 160 mg via ORAL
  Filled 2024-05-30 (×3): qty 1

## 2024-05-30 MED ORDER — FUROSEMIDE 40 MG PO TABS
40.0000 mg | ORAL_TABLET | Freq: Every day | ORAL | Status: DC
  Administered 2024-05-31 – 2024-06-01 (×2): 40 mg via ORAL
  Filled 2024-05-30: qty 1
  Filled 2024-05-30: qty 2

## 2024-05-30 MED ORDER — ATENOLOL 25 MG PO TABS
25.0000 mg | ORAL_TABLET | Freq: Every day | ORAL | Status: DC
  Administered 2024-05-31 – 2024-06-01 (×2): 25 mg via ORAL
  Filled 2024-05-30 (×2): qty 1

## 2024-05-30 NOTE — ED Triage Notes (Signed)
 Pt was given 2 nitrotabs  and 324 of asa

## 2024-05-30 NOTE — H&P (Signed)
 History and Physical    Brittany Irwin FMW:[id number] DOB: December 21, 1940 DOA: 05/30/2024  PCP: [name], MD  Patient coming from: Home  Chief Complaint: Chest pain  HPI: Brittany Irwin is a 83 y.o. female with medical history significant of HFpEF, CAD, A-fib on Eliquis , tachy-brady syndrome status post PPM, COPD on 4 L home oxygen, hypertension, hyperlipidemia, anxiety, Crohn's disease, SBO, psoriasis, migraine, CKD stage IIIa, spinal stenosis/chronic back pain, neuropathy, GERD presenting with a chief complaint of chest pain.  Patient states today she went to the grocery store with her sister when she started having sudden onset nonradiating substernal chest pressure.  She was also having some shortness of breath at that time.  No nausea, vomiting, or diaphoresis.  Patient took one sublingual nitroglycerin  tablet but was still having chest pressure so she took another tablet after which she started feeling better.  Also the grocery store clerk gave her 4 baby aspirins to chew before EMS arrived.  No longer having any active chest pain or pressure at this time.  She does recall having a similar episode of chest pressure a week ago at rest which resolved after she took nitroglycerin .  She is compliant with her home medications including Eliquis .  No other complaints.  ED Course: Vital signs stable on arrival, no change in oxygen requirement from baseline.  EKG showing paced rhythm.  Labs showing no leukocytosis or anemia, creatinine 1.4, troponin 173> 138.  Chest x-ray showing no active cardiopulmonary disease.  EDP discussed the case with cardiologist Dr. Gail who will see the patient in consultation, did not recommend starting IV heparin  for ACS at this time given flat troponin and no active chest pain.  Review of Systems:  Review of Systems  All other systems reviewed and are negative.   Past Medical History:  Diagnosis Date   Acute CHF (congestive heart failure) (HCC) 01/02/2022    AKI (acute kidney injury) 09/08/2020   Aspiration pneumonia (HCC) 01/05/2019   Atypical chest pain 03/11/2022   Bradycardia 03/14/2020   C. difficile colitis 04/03/2021   CHF (congestive heart failure) (HCC)    COPD (chronic obstructive pulmonary disease) (HCC)    Coronary artery disease    Tachycardia 05/31/2013    Past Surgical History:  Procedure Laterality Date   ABDOMINAL HYSTERECTOMY     BOWEL RESECTION     CHOLECYSTECTOMY       reports that she has quit smoking. Her smoking use included cigarettes. She does not have any smokeless tobacco history on file. She reports that she does not currently use alcohol . She reports that she does not use drugs.  Allergies  Allergen Reactions   Methylprednisolone  Other (See Comments)    Increased BP, HR, agitation, hallucinations    Shellfish Allergy Nausea And Vomiting    All SEAFOOD   Ativan  [Lorazepam ] Other (See Comments)    Psychosis   Azulfidine [Sulfasalazine] Other (See Comments)    Headache    Epipen [Epinephrine] Hypertension   Flagyl [Metronidazole] Hives   Motrin [Ibuprofen] Nausea Only and Other (See Comments)    Told not to take medication due to GI distress caused by crohn's disease.   Nsaids Other (See Comments)    Told not to take NSAIDs due to GI distress caused by crohn's disease   Penicillins Hives    12/2021, 11/2023 tolerated cephalosporins    Morphine And Codeine  Itching    Told not to take due to GI distress caused by crohn's disease    History reviewed.  No pertinent family history.  Prior to Admission medications   Medication Sig Start Date End Date Taking? Authorizing Provider  acidophilus (RISAQUAD) CAPS capsule Take 1 capsule by mouth 3 (three) times daily. 03/26/24   Akula, Vijaya, MD  ALPRAZolam  (XANAX ) 1 MG tablet Take 1 mg by mouth 2 (two) times daily. 01/02/22   [provider]  apixaban  (ELIQUIS ) 5 MG TABS tablet Take 5 mg by mouth 2 (two) times daily.    [provider]   atenolol  (TENORMIN ) 25 MG tablet Take 1 tablet (25 mg total) by mouth daily. 02/16/24   Sheikh, Alejandro Latif, DO  colestipol  (COLESTID ) 1 g tablet Take 1 tablet (1 g total) by mouth 2 (two) times daily as needed (diarrhea). 04/03/24   Amin, Ankit C, MD  cyclobenzaprine  (FLEXERIL ) 5 MG tablet Take 5 mg by mouth daily as needed for muscle spasms. 10/13/22   [provider]  diclofenac  Sodium (VOLTAREN  ARTHRITIS PAIN) 1 % GEL Apply 1 Application topically in the morning and at bedtime.    [provider]  ezetimibe  (ZETIA ) 10 MG tablet Take 10 mg by mouth at bedtime. 12/31/21   [provider]  fenofibrate  micronized (LOFIBRA) 200 MG capsule Take 200 mg by mouth daily. 12/31/21   [provider]  Fluticasone -Umeclidin-Vilant (TRELEGY ELLIPTA ) 200-62.5-25 MCG/ACT AEPB Inhale 1 puff into the lungs daily. 12/22/23     furosemide  (LASIX ) 20 MG tablet Take 1 tablet (20 mg total) by mouth daily. 04/07/24   Patt Alm Macho, MD  furosemide  (LASIX ) 40 MG tablet Take 40 mg by mouth daily.    [provider]  omeprazole (PRILOSEC) 40 MG capsule Take 40 mg by mouth daily.    [provider]  pregabalin  (LYRICA ) 50 MG capsule Take 50 mg by mouth 2 (two) times daily.    [provider]  spironolactone  (ALDACTONE ) 25 MG tablet Take 0.5 tablets (12.5 mg total) by mouth daily. 03/05/24 04/04/24  Dennise Lavada POUR, MD  [facility]) POWD Make liquids nectar thick. Kindly dispense any thickening product to make liquids nectar thick consistency, 1 month supply no refills. 03/05/24   Dennise Lavada POUR, MD    Physical Exam: Vitals:   05/30/24 1800 05/30/24 1915 05/30/24 2000 05/30/24 2013  BP: 122/64 (!) 123/58 109/65   Pulse: 62 (!) 52    Resp: 17 13 15    Temp:    97.6 F (36.4 C)  TempSrc:    Oral  SpO2: 99% 100%    Weight:      Height:        Physical Exam Vitals reviewed.  Constitutional:      General: She is not in acute  distress. HENT:     Head: Normocephalic and atraumatic.  Eyes:     Extraocular Movements: Extraocular movements intact.  Cardiovascular:     Rate and Rhythm: Normal rate and regular rhythm.  Pulmonary:     Effort: Pulmonary effort is normal. No respiratory distress.     Breath sounds: Normal breath sounds.  Abdominal:     General: Bowel sounds are normal.     Palpations: Abdomen is soft.     Tenderness: There is no abdominal tenderness. There is no guarding.  Musculoskeletal:     Cervical back: Normal range of motion.     Right lower leg: No edema.     Left lower leg: No edema.  Skin:    General: Skin is warm and dry.  Neurological:     General:  No focal deficit present.     Mental Status: She is alert and oriented to person, place, and time.     Labs on Admission: I have personally reviewed following labs and imaging studies  CBC: Recent Labs  Lab 05/30/24 1532  WBC 9.5  HGB 13.4  HCT 42.5  MCV 91.0  PLT 219   Basic Metabolic Panel: Recent Labs  Lab 05/30/24 1532  NA 139  K 5.0  CL 99  CO2 27  GLUCOSE 107*  BUN 24*  CREATININE 1.40*  CALCIUM 9.2   GFR: Estimated Creatinine Clearance: 31 mL/min (A) (by C-G formula based on SCr of 1.4 mg/dL (H)). Liver Function Tests: No results for input(s): AST, ALT, ALKPHOS, BILITOT, PROT, ALBUMIN in the last 168 hours. No results for input(s): LIPASE, AMYLASE in the last 168 hours. No results for input(s): AMMONIA in the last 168 hours. Coagulation Profile: No results for input(s): INR, PROTIME in the last 168 hours. Cardiac Enzymes: No results for input(s): CKTOTAL, CKMB, CKMBINDEX, TROPONINI in the last 168 hours. BNP (last 3 results) No results for input(s): PROBNP in the last 8760 hours. HbA1C: No results for input(s): HGBA1C in the last 72 hours. CBG: No results for input(s): GLUCAP in the last 168 hours. Lipid Profile: No results for input(s): CHOL, HDL, LDLCALC,  TRIG, CHOLHDL, LDLDIRECT in the last 72 hours. Thyroid  Function Tests: No results for input(s): TSH, T4TOTAL, FREET4, T3FREE, THYROIDAB in the last 72 hours. Anemia Panel: No results for input(s): VITAMINB12, FOLATE, FERRITIN, TIBC, IRON, RETICCTPCT in the last 72 hours. Urine analysis:    Component Value Date/Time   COLORURINE YELLOW 04/07/2024 1838   APPEARANCEUR CLEAR 04/07/2024 1838   LABSPEC 1.008 04/07/2024 1838   PHURINE 5.0 04/07/2024 1838   GLUCOSEU NEGATIVE 04/07/2024 1838   HGBUR SMALL (A) 04/07/2024 1838   BILIRUBINUR NEGATIVE 04/07/2024 1838   KETONESUR NEGATIVE 04/07/2024 1838   PROTEINUR NEGATIVE 04/07/2024 1838   NITRITE NEGATIVE 04/07/2024 1838   LEUKOCYTESUR TRACE (A) 04/07/2024 1838    Radiological Exams on Admission: DG Chest 2 View Result Date: 05/30/2024 CLINICAL DATA:  Chest pain and shortness of breath. EXAM: CHEST - 2 VIEW COMPARISON:  Chest radiograph dated 04/07/2024. FINDINGS: No focal consolidation, pleural effusion, pneumothorax. Stable cardiac silhouette. Left pectoral pacemaker device. No acute osseous pathology. IMPRESSION: No active cardiopulmonary disease. Electronically Signed   By: Vanetta Chou M.D.   On: 05/30/2024 17:14    Assessment and Plan  Chest pain History of CAD EKG showing paced rhythm.  Troponin 173> 138, pattern not consistent with ACS.  Chest x-ray showing no active cardiopulmonary disease.  PE less likely as she is chronically anticoagulated.  No tachycardia or increasing oxygen requirement from baseline.  Chest pain resolved after sublingual nitroglycerin  x 2 prior to arrival.  Also patient received full dose aspirin  prior to arrival.  Cardiac cath done in 2021 was showing mild nonobstructive CAD.  Cardiology consulted and did not recommend starting  IV heparin  given flat troponin and no active chest pain.  Cardiology will see the patient and give further recommendations.  Continue cardiac  monitoring.  Chronic HFpEF Last echo done in August 2025 showing EF 60 to 65%, mild mitral regurgitation, and mild aortic regurgitation.  Appears euvolemic on exam.  Patient current dose of her home diuretics.  Patient reports taking furosemide  40 mg daily and spironolactone  12.5 mg daily.  A-fib Tachy-brady syndrome status post PPM Continue atenolol  and Eliquis .  COPD Chronic hypoxemic respiratory failure on 4 L  home oxygen Stable, no wheezing.  No change in oxygen requirement from baseline.  Started on Breztri  which is hospital formulary replacement for her home Trelegy.  Hypertension Currently normotensive.  Continue atenolol  and diuretics.  Hyperlipidemia Continue home meds.  Anxiety Continue Xanax .  CKD stage IIIa Creatinine currently 1.4, stable since 04/07/2024.  Continue to monitor renal function.  Spinal stenosis/chronic back pain Continue Lyrica , Flexeril  PRN  GERD Continue PPI.  Home medications initiated per patient's reported history.  Awaiting pharmacy med rec for verification.   DVT prophylaxis: Eliquis  Code Status: Full Code (discussed with the patient) Family Communication: Sister at bedside. Level of care: Progressive Care Unit Admission status: It is my clinical opinion that referral for OBSERVATION is reasonable and necessary in this patient based on the above information provided. The aforementioned taken together are felt to place the patient at high risk for further clinical deterioration. However, it is anticipated that the patient may be medically stable for discharge from the hospital within 24 to 48 hours.  Editha Ram MD [facility]  If 7PM-7AM, please contact night-coverage [email or web address]  05/30/2024, 9:55 PM

## 2024-05-30 NOTE — ED Notes (Signed)
 Phlebotomy asked to see patient for ordered troponin.

## 2024-05-30 NOTE — Consult Note (Incomplete)
 Cardiology Consultation   Patient ID: Brittany Irwin MRN: [id number]; DOB: 10-Apr-1941  Admit date: 05/30/2024 Date of Consult: 05/30/2024  PCP:  Brittany Pierce, MD   [facility] Providers Cardiologist:  LUKE BASS, DO   { Click here to update MD or APP on Care Team, Refresh:1}     Patient Profile: Brittany Irwin is a 83 y.o. female with a hx of tachy brady syndrom s/p DC PM, CAD, HTN, COPD, CKD and Crohn's disease who is being seen 05/30/2024 for the evaluation of chest pain at the request of Emergency Department.  History of Present Illness: Ms. Brittany Irwin ***   Past Medical History:  Diagnosis Date   Acute CHF (congestive heart failure) (HCC) 01/02/2022   AKI (acute kidney injury) 09/08/2020   Aspiration pneumonia (HCC) 01/05/2019   Atypical chest pain 03/11/2022   Bradycardia 03/14/2020   C. difficile colitis 04/03/2021   CHF (congestive heart failure) (HCC)    COPD (chronic obstructive pulmonary disease) (HCC)    Coronary artery disease    Tachycardia 05/31/2013    Past Surgical History:  Procedure Laterality Date   ABDOMINAL HYSTERECTOMY     BOWEL RESECTION     CHOLECYSTECTOMY       {Home Medications (Optional):21181}  Scheduled Meds:  ALPRAZolam   1 mg Oral BID   apixaban   5 mg Oral BID   [START ON 05/31/2024] atenolol   25 mg Oral Daily   [START ON 05/31/2024] budesonide -glycopyrrolate -formoterol   2 puff Inhalation BID   ezetimibe   10 mg Oral QHS   [START ON 05/31/2024] fenofibrate   160 mg Oral Daily   [START ON 05/31/2024] furosemide   40 mg Oral Daily   [START ON 05/31/2024] pantoprazole   40 mg Oral Daily   pregabalin   50 mg Oral BID   [START ON 05/31/2024] spironolactone   12.5 mg Oral Daily   Continuous Infusions:  PRN Meds: acetaminophen  **OR** acetaminophen , cyclobenzaprine   Allergies:    Allergies  Allergen Reactions   Methylprednisolone  Other (See Comments)    Increased BP, HR, agitation, hallucinations    Shellfish  Allergy Nausea And Vomiting    All SEAFOOD   Ativan  [Lorazepam ] Other (See Comments)    Psychosis   Azulfidine [Sulfasalazine] Other (See Comments)    Headache    Epipen [Epinephrine] Hypertension   Flagyl [Metronidazole] Hives   Motrin [Ibuprofen] Nausea Only and Other (See Comments)    Told not to take medication due to GI distress caused by crohn's disease.   Nsaids Other (See Comments)    Told not to take NSAIDs due to GI distress caused by crohn's disease   Penicillins Hives    12/2021, 11/2023 tolerated cephalosporins    Morphine And Codeine  Itching    Told not to take due to GI distress caused by crohn's disease    Social History:   Social History   Socioeconomic History   Marital status: Single    Spouse name: Not on file   Number of children: Not on file   Years of education: Not on file   Highest education level: Not on file  Occupational History   Not on file  Tobacco Use   Smoking status: Former    Types: Cigarettes   Smokeless tobacco: Not on file  Substance and Sexual Activity   Alcohol  use: Not Currently   Drug use: Never   Sexual activity: Not on file  Other Topics Concern   Not on file  Social History Narrative   Not on file   Social Drivers  of Health   Financial Resource Strain: Low Risk  (10/14/2023)   Received from [facility]   Overall Financial Resource Strain (CARDIA)    Difficulty of Paying Living Expenses: Not hard at all  Recent Concern: Financial Resource Strain - Medium Risk (07/27/2023)   Received from [facility]   Overall Financial Resource Strain (CARDIA)    Difficulty of Paying Living Expenses: Somewhat hard  Food Insecurity: No Food Insecurity (04/01/2024)   Hunger Vital Sign    Worried About Running Out of Food in the Last Year: Never true    Ran Out of Food in the Last Year: Never true  Transportation Needs: No Transportation Needs (04/01/2024)   PRAPARE - Administrator, Civil Service (Medical): No    Lack of  Transportation (Non-Medical): No  Physical Activity: Insufficiently Active (07/27/2023)   Received from [facility]   Exercise Vital Sign    On average, how many days per week do you engage in moderate to strenuous exercise (like a brisk walk)?: 1 day    On average, how many minutes do you engage in exercise at this level?: 20 min  Stress: No Stress Concern Present (07/27/2023)   Received from [facility] - Occupational Stress Questionnaire    Feeling of Stress : Only a little  Social Connections: Unknown (04/01/2024)   Social Connection and Isolation Panel    Frequency of Communication with Friends and Family: Patient declined    Frequency of Social Gatherings with Friends and Family: Patient declined    Attends Religious Services: Patient declined    Database Administrator or Organizations: Patient declined    Attends Banker Meetings: Not on file    Marital Status: Patient declined  Intimate Partner Violence: Unknown (04/01/2024)   Humiliation, Afraid, Rape, and Kick questionnaire    Fear of Current or Ex-Partner: No    Emotionally Abused: No    Physically Abused: Not on file    Sexually Abused: Not on file    Family History:   History reviewed. No pertinent family history.   ROS:  Please see the history of present illness.   All other ROS reviewed and negative.     Physical Exam/Data: Vitals:   05/30/24 1800 05/30/24 1915 05/30/24 2000 05/30/24 2013  BP: 122/64 (!) 123/58 109/65   Pulse: 62 (!) 52    Resp: 17 13 15    Temp:    97.6 F (36.4 C)  TempSrc:    Oral  SpO2: 99% 100%    Weight:      Height:       No intake or output data in the 24 hours ending 05/30/24 2257    05/30/2024    3:10 PM 04/07/2024    3:05 PM 03/31/2024    4:00 PM  Last 3 Weights  Weight (lbs) 163 lb 2.3 oz 165 lb 162 lb 11.2 oz  Weight (kg) 74 kg 74.844 kg 73.8 kg     Body mass index is 28 kg/m.  General:  Well nourished, well  developed, in no acute distress*** HEENT: normal Neck: no JVD Vascular: No carotid bruits; Distal pulses 2+ bilaterally Cardiac:  normal S1, S2; RRR; no murmur *** Lungs:  clear to auscultation bilaterally, no wheezing, rhonchi or rales  Abd: soft, nontender, no hepatomegaly  Ext: no edema Musculoskeletal:  No deformities, BUE and BLE strength normal and equal Skin: warm and dry  Neuro:  CNs 2-12 intact, no  focal abnormalities noted Psych:  Normal affect   EKG:  The EKG was personally reviewed and demonstrates:  reviewed Telemetry:  Telemetry was personally reviewed and demonstrates:  reviewed  Relevant CV Studies: reviewed  Laboratory Data: High Sensitivity Troponin:   Recent Labs  Lab 05/30/24 1532 05/30/24 1825  TROPONINIHS 173* 138*     Chemistry Recent Labs  Lab 05/30/24 1532  NA 139  K 5.0  CL 99  CO2 27  GLUCOSE 107*  BUN 24*  CREATININE 1.40*  CALCIUM 9.2  GFRNONAA 38*  ANIONGAP 13    No results for input(s): PROT, ALBUMIN, AST, ALT, ALKPHOS, BILITOT in the last 168 hours. Lipids No results for input(s): CHOL, TRIG, HDL, LABVLDL, LDLCALC, CHOLHDL in the last 168 hours.  Hematology Recent Labs  Lab 05/30/24 1532  WBC 9.5  RBC 4.67  HGB 13.4  HCT 42.5  MCV 91.0  MCH 28.7  MCHC 31.5  RDW 14.9  PLT 219   Thyroid  No results for input(s): TSH, FREET4 in the last 168 hours.  BNPNo results for input(s): BNP, PROBNP in the last 168 hours.  DDimer No results for input(s): DDIMER in the last 168 hours.  Radiology/Studies:  DG Chest 2 View Result Date: 05/30/2024 CLINICAL DATA:  Chest pain and shortness of breath. EXAM: CHEST - 2 VIEW COMPARISON:  Chest radiograph dated 04/07/2024. FINDINGS: No focal consolidation, pleural effusion, pneumothorax. Stable cardiac silhouette. Left pectoral pacemaker device. No acute osseous pathology. IMPRESSION: No active cardiopulmonary disease. Electronically Signed   By: Vanetta Chou  M.D.   On: 05/30/2024 17:14     Assessment and Plan: ***   Risk Assessment/Risk Scores: {Complete the following score calculators/questions to meet required metrics.  Press F2         :[id number]}             For questions or updates, please contact [facility] Please consult [email or web address] for contact info under    {TIP  Split Shared Billing  Do NOT delete any part of this including brackets If split shared billing is based upon MDM, disregard If billing will be based upon TIME you MUST document the number of minutes and a detailed list of what was done in that time in the following format Example - I spent ** minutes seeing this patient. During that time I reviewed their history, evaluated their symptoms, reviewed available labs, EKGs, studies, performed an exam and formulated an assessment and plan   :1} {Select this only if you need to document critical care time (Optional):[phone]} Signed, Laurene DELENA Pacini, MD  05/30/2024 10:57 PM

## 2024-05-30 NOTE — Consult Note (Incomplete)
 Cardiology Consultation   Patient ID: Brittany Irwin MRN: [id number]; DOB: 06-Jan-1941  Admit date: 05/30/2024 Date of Consult: 05/30/2024  PCP:  [name], MD   [facility] Providers Cardiologist:  LUKE BASS, DO   { Click here to update MD or APP on Care Team, Refresh:1}     Patient Profile: Brittany Irwin is a 83 y.o. female with a hx of tachy brady syndrom s/p DC PM, CAD, HTN, COPD, CKD and Crohn's disease who is being seen 05/30/2024 for the evaluation of chest pain at the request of Emergency Department.  History of Present Illness: Ms. Brittany Irwin was in her usual state of health when she states she endorsed some chest pain that occurred sometime today.  Patient was in the middle of switching her oxygen tank prior to her chest pain starting.  She took 2 sublingual nitro tablets.  Her pain was relieved after the second tablet.  States her pain felt like a pressure, nonradiating, located in the substernal region.  Her pain was 4-5 out of 10 in severity.  She had no other associated symptoms.  Patient states she had a similar episode that occurred sometime last week.  Patient primarily follows up with [facility].  Patient has had several workups for coronary artery disease, most recently she had a left heart catheterization on 12/14/2019 at [facility] evaluation of NSTEMI.  Findings included mild grade stenosis of the LAD, otherwise no coronary artery disease of the other vascular territories.  Patient is also had a regadenoson nuclear medicine stress test on 03/13/2022.  During the study, patient had no ST changes on ECG.  Imaging perfusion showed no evidence of regional wall motion abnormalities.  TID was 1.24.  Recent TTE on 03/04/2024 shows normal EF, mild valvular disease, and no LVH.  Patient has a history of chronic obstructive pulmonary disease.  She is on supplemental oxygen at home.  Notable labs include hsTnT 173->138, sCr 1.4, GFR 38. CXR showed active  cadiopulmonary disease.   Past Medical History:  Diagnosis Date  . Acute CHF (congestive heart failure) (HCC) 01/02/2022  . AKI (acute kidney injury) 09/08/2020  . Aspiration pneumonia (HCC) 01/05/2019  . Atypical chest pain 03/11/2022  . Bradycardia 03/14/2020  . C. difficile colitis 04/03/2021  . CHF (congestive heart failure) (HCC)   . COPD (chronic obstructive pulmonary disease) (HCC)   . Coronary artery disease   . Tachycardia 05/31/2013    Past Surgical History:  Procedure Laterality Date  . ABDOMINAL HYSTERECTOMY    . BOWEL RESECTION    . CHOLECYSTECTOMY       Home Medications:  Prior to Admission medications   Medication Sig Start Date End Date Taking? Authorizing Provider  acidophilus (RISAQUAD) CAPS capsule Take 1 capsule by mouth 3 (three) times daily. 03/26/24   Akula, Vijaya, MD  ALPRAZolam  (XANAX ) 1 MG tablet Take 1 mg by mouth 2 (two) times daily. 01/02/22   [provider]  apixaban  (ELIQUIS ) 5 MG TABS tablet Take 5 mg by mouth 2 (two) times daily.    [provider]  atenolol  (TENORMIN ) 25 MG tablet Take 1 tablet (25 mg total) by mouth daily. 02/16/24   Sherrill Cable Latif, DO  colestipol  (COLESTID ) 1 g tablet Take 1 tablet (1 g total) by mouth 2 (two) times daily as needed (diarrhea). 04/03/24   Amin, Ankit C, MD  cyclobenzaprine  (FLEXERIL ) 5 MG tablet Take 5 mg by mouth daily as needed for muscle spasms. 10/13/22   [provider]  diclofenac  Sodium (VOLTAREN  ARTHRITIS PAIN) 1 % GEL Apply 1 Application topically in the morning and at bedtime.    [provider]  ezetimibe  (ZETIA ) 10 MG tablet Take 10 mg by mouth at bedtime. 12/31/21   [provider]  fenofibrate  micronized (LOFIBRA) 200 MG capsule Take 200 mg by mouth daily. 12/31/21   [provider]  Fluticasone -Umeclidin-Vilant (TRELEGY ELLIPTA ) 200-62.5-25 MCG/ACT AEPB Inhale 1 puff into the lungs daily. 12/22/23     furosemide  (LASIX ) 20 MG tablet Take 1 tablet  (20 mg total) by mouth daily. 04/07/24   Patt Alm Macho, MD  furosemide  (LASIX ) 40 MG tablet Take 40 mg by mouth daily.    [provider]  omeprazole (PRILOSEC) 40 MG capsule Take 40 mg by mouth daily.    [provider]  pregabalin  (LYRICA ) 50 MG capsule Take 50 mg by mouth 2 (two) times daily.    [provider]  spironolactone  (ALDACTONE ) 25 MG tablet Take 0.5 tablets (12.5 mg total) by mouth daily. 03/05/24 04/04/24  Dennise Lavada POUR, MD  ST[facility] DEXTRIN [facility]) POWD Make liquids nectar thick. Kindly dispense any thickening product to make liquids nectar thick consistency, 1 month supply no refills. 03/05/24   Singh, Prashant K, MD    Scheduled Meds: . ALPRAZolam   1 mg Oral BID  . apixaban   5 mg Oral BID  . [START ON 05/31/2024] atenolol   25 mg Oral Daily  . [START ON 05/31/2024] budesonide -glycopyrrolate -formoterol   2 puff Inhalation BID  . ezetimibe   10 mg Oral QHS  . [START ON 05/31/2024] fenofibrate   160 mg Oral Daily  . [START ON 05/31/2024] furosemide   40 mg Oral Daily  . [START ON 05/31/2024] pantoprazole   40 mg Oral Daily  . pregabalin   50 mg Oral BID  . [START ON 05/31/2024] spironolactone   12.5 mg Oral Daily   Continuous Infusions:  PRN Meds: acetaminophen  **OR** acetaminophen , cyclobenzaprine   Allergies:    Allergies  Allergen Reactions  . Methylprednisolone  Other (See Comments)    Increased BP, HR, agitation, hallucinations   . Shellfish Allergy Nausea And Vomiting    All SEAFOOD  . Ativan  [Lorazepam ] Other (See Comments)    Psychosis  . Azulfidine [Sulfasalazine] Other (See Comments)    Headache   . Epipen [Epinephrine] Hypertension  . Flagyl [Metronidazole] Hives  . Motrin [Ibuprofen] Nausea Only and Other (See Comments)    Told not to take medication due to GI distress caused by crohn's disease.  . Nsaids Other (See Comments)    Told not to take NSAIDs due to GI distress caused by crohn's disease  . Penicillins Hives     12/2021, 11/2023 tolerated cephalosporins   . Morphine And Codeine  Itching    Told not to take due to GI distress caused by crohn's disease    Social History:   Social History   Socioeconomic History  . Marital status: Single    Spouse name: Not on file  . Number of children: Not on file  . Years of education: Not on file  . Highest education level: Not on file  Occupational History  . Not on file  Tobacco Use  . Smoking status: Former    Types: Cigarettes  . Smokeless tobacco: Not on file  Substance and Sexual Activity  . Alcohol  use: Not Currently  . Drug use: Never  . Sexual activity: Not on file  Other Topics Concern  . Not on file  Social History Narrative  . Not on file   Social  Drivers of Health   Financial Resource Strain: Low Risk  (10/14/2023)   Received from [facility]   Overall Financial Resource Strain (CARDIA)   . Difficulty of Paying Living Expenses: Not hard at all  Recent Concern: Financial Resource Strain - Medium Risk (07/27/2023)   Received from [facility]   Overall Financial Resource Strain (CARDIA)   . Difficulty of Paying Living Expenses: Somewhat hard  Food Insecurity: No Food Insecurity (04/01/2024)   Hunger Vital Sign   . Worried About Programme Researcher, Broadcasting/film/video in the Last Year: Never true   . Ran Out of Food in the Last Year: Never true  Transportation Needs: No Transportation Needs (04/01/2024)   PRAPARE - Transportation   . Lack of Transportation (Medical): No   . Lack of Transportation (Non-Medical): No  Physical Activity: Insufficiently Active (07/27/2023)   Received from [facility]   Exercise Vital Sign   . On average, how many days per week do you engage in moderate to strenuous exercise (like a brisk walk)?: 1 day   . On average, how many minutes do you engage in exercise at this level?: 20 min  Stress: No Stress Concern Present (07/27/2023)   Received from [facility] - Occupational Stress  Questionnaire   . Feeling of Stress : Only a little  Social Connections: Unknown (04/01/2024)   Social Connection and Isolation Panel   . Frequency of Communication with Friends and Family: Patient declined   . Frequency of Social Gatherings with Friends and Family: Patient declined   . Attends Religious Services: Patient declined   . Active Member of Clubs or Organizations: Patient declined   . Attends Banker Meetings: Not on file   . Marital Status: Patient declined  Intimate Partner Violence: Unknown (04/01/2024)   Humiliation, Afraid, Rape, and Kick questionnaire   . Fear of Current or Ex-Partner: No   . Emotionally Abused: No   . Physically Abused: Not on file   . Sexually Abused: Not on file    Family History:   History reviewed. No pertinent family history.   ROS:  Please see the history of present illness.   All other ROS reviewed and negative.     Physical Exam/Data: Vitals:   05/30/24 1800 05/30/24 1915 05/30/24 2000 05/30/24 2013  BP: 122/64 (!) 123/58 109/65   Pulse: 62 (!) 52    Resp: 17 13 15    Temp:    97.6 F (36.4 C)  TempSrc:    Oral  SpO2: 99% 100%    Weight:      Height:       No intake or output data in the 24 hours ending 05/30/24 2257    05/30/2024    3:10 PM 04/07/2024    3:05 PM 03/31/2024    4:00 PM  Last 3 Weights  Weight (lbs) 163 lb 2.3 oz 165 lb 162 lb 11.2 oz  Weight (kg) 74 kg 74.844 kg 73.8 kg     Body mass index is 28 kg/m.  General:  Well nourished, well developed, in no acute distress HEENT: normal Neck: no JVD Vascular: No carotid bruits; Distal pulses 2+ bilaterally Cardiac:  normal S1, S2; RRR; no murmur  Lungs:  clear to auscultation bilaterally, no wheezing, rhonchi or rales  Abd: soft, nontender, no hepatomegaly  Ext: no edema Musculoskeletal:  No deformities, BUE and BLE strength normal and equal Skin: warm and dry  Neuro:  CNs 2-12 intact, no  focal abnormalities noted Psych:  Normal affect   EKG:  The  EKG was personally reviewed and demonstrates:  reviewed Telemetry:  Telemetry was personally reviewed and demonstrates:  reviewed  Relevant CV Studies: reviewed  Laboratory Data: High Sensitivity Troponin:   Recent Labs  Lab 05/30/24 1532 05/30/24 1825  TROPONINIHS 173* 138*     Chemistry Recent Labs  Lab 05/30/24 1532  NA 139  K 5.0  CL 99  CO2 27  GLUCOSE 107*  BUN 24*  CREATININE 1.40*  CALCIUM 9.2  GFRNONAA 38*  ANIONGAP 13    No results for input(s): PROT, ALBUMIN, AST, ALT, ALKPHOS, BILITOT in the last 168 hours. Lipids No results for input(s): CHOL, TRIG, HDL, LABVLDL, LDLCALC, CHOLHDL in the last 168 hours.  Hematology Recent Labs  Lab 05/30/24 1532  WBC 9.5  RBC 4.67  HGB 13.4  HCT 42.5  MCV 91.0  MCH 28.7  MCHC 31.5  RDW 14.9  PLT 219   Thyroid  No results for input(s): TSH, FREET4 in the last 168 hours.  BNPNo results for input(s): BNP, PROBNP in the last 168 hours.  DDimer No results for input(s): DDIMER in the last 168 hours.  Radiology/Studies:  DG Chest 2 View Result Date: 05/30/2024 CLINICAL DATA:  Chest pain and shortness of breath. EXAM: CHEST - 2 VIEW COMPARISON:  Chest radiograph dated 04/07/2024. FINDINGS: No focal consolidation, pleural effusion, pneumothorax. Stable cardiac silhouette. Left pectoral pacemaker device. No acute osseous pathology. IMPRESSION: No active cardiopulmonary disease. Electronically Signed   By: Vanetta Chou M.D.   On: 05/30/2024 17:14     Assessment and Plan:  AMIAYA MCNEELEY is a 83 y.o. female with a hx of tachy brady syndrom s/p DC PM, CAD, HTN, COPD, CKD and Crohn's disease who is being seen 05/30/2024 for the evaluation of chest pain at the request of Emergency Department.  CATHRINE KRIZAN is a 83 y.o. female with a hx of tachy brady syndrom s/p DC PM, CAD, HTN, COPD, CKD and Crohn's disease who is being seen 05/30/2024 for the evaluation of chest pain at the  request of Emergency Department.  ***   Risk Assessment/Risk Scores: {Complete the following score calculators/questions to meet required metrics.  Press F2         :[id number]}             For questions or updates, please contact [facility] Please consult [email or web address] for contact info under    {TIP  Split Shared Billing  Do NOT delete any part of this including brackets If split shared billing is based upon MDM, disregard If billing will be based upon TIME you MUST document the number of minutes and a detailed list of what was done in that time in the following format Example - I spent ** minutes seeing this patient. During that time I reviewed their history, evaluated their symptoms, reviewed available labs, EKGs, studies, performed an exam and formulated an assessment and plan   :1} {Select this only if you need to document critical care time (Optional):[id number]} Signed, Laurene DELENA Pacini, MD  05/30/2024 10:57 PM

## 2024-05-30 NOTE — ED Provider Notes (Signed)
 [place] EMERGENCY DEPARTMENT AT [facility] Provider Note   CSN: [id number] Arrival date & time: 05/30/24  1458     Patient presents with: Chest Pain   Brittany Irwin is a 83 y.o. female.   83 year old female history of COPD on 4 L nasal cannula, CHF, nonobstructive CAD, CKD, tachybradycardia syndrome, atrial fibrillation on Eliquis  who presents emergency department chest pain.  Patient reports that earlier today she was at the grocery store and started having substernal chest tightness.  Was moderately severe.  No radiation.  Required her to stop what she was doing and she took 2 nitroglycerin  which resolved the pain.  Also received 324 mg of aspirin .  No diaphoresis or vomiting.  In 2021 had left heart cath which showed less than 25% stenosis of the LAD with left circumflex with mild irregularities and RCA with mild irregularities but no other significant coronary disease       Prior to Admission medications   Medication Sig Start Date End Date Taking? Authorizing Provider  acidophilus (RISAQUAD) CAPS capsule Take 1 capsule by mouth 3 (three) times daily. 03/26/24   Cherlyn Labella, MD  ALPRAZolam  (XANAX ) 1 MG tablet Take 1 mg by mouth 2 (two) times daily. 01/02/22   [provider]  apixaban  (ELIQUIS ) 5 MG TABS tablet Take 5 mg by mouth 2 (two) times daily.    [provider]  atenolol  (TENORMIN ) 25 MG tablet Take 1 tablet (25 mg total) by mouth daily. 02/16/24   Sherrill Cable Latif, DO  colestipol  (COLESTID ) 1 g tablet Take 1 tablet (1 g total) by mouth 2 (two) times daily as needed (diarrhea). 04/03/24   Amin, Ankit C, MD  cyclobenzaprine  (FLEXERIL ) 5 MG tablet Take 5 mg by mouth daily as needed for muscle spasms. 10/13/22   [provider]  diclofenac  Sodium (VOLTAREN  ARTHRITIS PAIN) 1 % GEL Apply 1 Application topically in the morning and at bedtime.    [provider]  ezetimibe  (ZETIA ) 10 MG tablet Take 10 mg by mouth at bedtime.  12/31/21   [provider]  fenofibrate  micronized (LOFIBRA) 200 MG capsule Take 200 mg by mouth daily. 12/31/21   [provider]  Fluticasone -Umeclidin-Vilant (TRELEGY ELLIPTA ) 200-62.5-25 MCG/ACT AEPB Inhale 1 puff into the lungs daily. 12/22/23     furosemide  (LASIX ) 20 MG tablet Take 1 tablet (20 mg total) by mouth daily. 04/07/24   Patt Alm Macho, MD  furosemide  (LASIX ) 40 MG tablet Take 40 mg by mouth daily.    [provider]  omeprazole (PRILOSEC) 40 MG capsule Take 40 mg by mouth daily.    [provider]  pregabalin  (LYRICA ) 50 MG capsule Take 50 mg by mouth 2 (two) times daily.    [provider]  spironolactone  (ALDACTONE ) 25 MG tablet Take 0.5 tablets (12.5 mg total) by mouth daily. 03/05/24 04/04/24  Dennise Lavada POUR, MD  [facility] DEXTRIN [facility]) POWD Make liquids nectar thick. Kindly dispense any thickening product to make liquids nectar thick consistency, 1 month supply no refills. 03/05/24   Singh, Prashant K, MD    Allergies: Methylprednisolone , Shellfish allergy, Ativan  [lorazepam ], Azulfidine [sulfasalazine], Epipen [epinephrine], Flagyl [metronidazole], Motrin [ibuprofen], Nsaids, Penicillins, and Morphine and codeine     Review of Systems  Updated Vital Signs BP 109/65   Pulse (!) 52   Temp 97.6 F (36.4 C) (Oral)   Resp 15   Ht 5' 4 (1.626 m)   Wt 74 kg   SpO2 100%   BMI 28.00  kg/m   Physical Exam Vitals and nursing note reviewed.  Constitutional:      General: She is not in acute distress.    Appearance: She is well-developed.  HENT:     Head: Normocephalic and atraumatic.     Right Ear: External ear normal.     Left Ear: External ear normal.     Nose: Nose normal.  Eyes:     Extraocular Movements: Extraocular movements intact.     Conjunctiva/sclera: Conjunctivae normal.     Pupils: Pupils are equal, round, and reactive to light.  Cardiovascular:     Rate and Rhythm: Normal rate and regular rhythm.      Heart sounds: No murmur heard. Pulmonary:     Effort: Pulmonary effort is normal. No respiratory distress.     Breath sounds: Normal breath sounds.     Comments: On 4 L nasal cannula (baseline) Musculoskeletal:     Cervical back: Normal range of motion and neck supple.     Right lower leg: No edema.     Left lower leg: No edema.     Comments: Radial pulses 2+ bilateral  Skin:    General: Skin is warm and dry.  Neurological:     Mental Status: She is alert and oriented to person, place, and time. Mental status is at baseline.  Psychiatric:        Mood and Affect: Mood normal.     (all labs ordered are listed, but only abnormal results are displayed) Labs Reviewed  BASIC METABOLIC PANEL WITH GFR - Abnormal; Notable for the following components:      Result Value   Glucose, Bld 107 (*)    BUN 24 (*)    Creatinine, Ser 1.40 (*)    GFR, Estimated 38 (*)    All other components within normal limits  TROPONIN I (HIGH SENSITIVITY) - Abnormal; Notable for the following components:   Troponin I (High Sensitivity) 173 (*)    All other components within normal limits  TROPONIN I (HIGH SENSITIVITY) - Abnormal; Notable for the following components:   Troponin I (High Sensitivity) 138 (*)    All other components within normal limits  CBC    EKG: EKG Interpretation Date/Time:  Tuesday May 30 2024 15:04:01 EST Ventricular Rate:  64 PR Interval:  242 QRS Duration:  150 QT Interval:  472 QTC Calculation: 486 R Axis:   236  Text Interpretation: AV dual-paced rhythm with prolonged AV conduction Abnormal ECG When compared with ECG of 07-Apr-2024 15:22, PREVIOUS ECG IS PRESENT Confirmed by Cottie Cough [phone]) on 05/30/2024 3:58:39 PM  Radiology: [facility] Chest 2 View Result Date: 05/30/2024 CLINICAL DATA:  Chest pain and shortness of breath. EXAM: CHEST - 2 VIEW COMPARISON:  Chest radiograph dated 04/07/2024. FINDINGS: No focal consolidation, pleural effusion, pneumothorax. Stable  cardiac silhouette. Left pectoral pacemaker device. No acute osseous pathology. IMPRESSION: No active cardiopulmonary disease. Electronically Signed   By: Vanetta Chou M.D.   On: 05/30/2024 17:14     Procedures   Medications Ordered in the ED  acetaminophen  (TYLENOL ) tablet 1,000 mg (1,000 mg Oral Given 05/30/24 2018)    Clinical Course as of 05/30/24 2135  Tue May 30, 2024  2106 Discussed with Dr Gail from cardiology who will see the patient.  [RP]  2124 Dr Alfornia from hospitalist consulted for admission.  [RP]    Clinical Course User Index [RP] Yolande Lamar BROCKS, MD  Medical Decision Making Amount and/or Complexity of Data Reviewed Labs: ordered. Radiology: ordered.  Risk OTC drugs. Decision regarding hospitalization.   Brittany Irwin is a 83 year old female history of COPD on 4 L nasal cannula, CHF, nonobstructive CAD, CKD, tachybradycardia syndrome, atrial fibrillation on Eliquis  who presents emergency department chest pain.    Initial Ddx:  MI, PE, pneumonia, dissection, pericarditis, costochondritis, reflux  MDM:  Patient presents emergency department chest pain that resolved with nitroglycerin .  Was given aspirin  prior to arrival.  With the patient's chest discomfort will obtain EKG and troponins to evaluate for MI.  Also considering pulmonary embolism but patient is already on 5 mg twice daily of Eliquis  making PE highly unlikely.  Considered dissection but with their symmetric pulses, history, and description of the pain feel it is less likely.  If chest x-ray reveals widened mediastinum or any other concerning findings will consider CTA.  Also considered pericarditis but description is unlikely and they do not have risk factors for this diagnosis.  Chest pain not reproducible so feel it costochondritis less likely.  No infectious symptoms to suggest pneumonia at this time that would be causing pleuritic chest pain.  Plan:   Labs Troponin EKG Chest x-ray  ED Summary/Re-evaluation:  EKG showed paced rhythm.  Serial troponins were elevated and downtrending.  Does have CKD so this could be playing a role and it appears that her troponins are typically elevated.  Given the concerning nature of her history and the fact it has been several years since an ischemic evaluation did discuss with cardiology who will see the patient and drop a note.  Not felt that she needs heparin  at this point in time because she is currently chest pain-free.  Discussed with hospitalist for admission for high risk chest pain  This patient presents to the ED for concern of complaints listed in HPI, this involves an extensive number of treatment options, and is a complaint that carries with it a high risk of complications and morbidity. Disposition including potential need for admission considered.   Dispo: Admit to Floor  Additional history obtained from sister Records reviewed Outpatient Clinic Notes The following labs were independently interpreted: Serial Troponins and show elevated but flat I independently reviewed the following imaging with scope of interpretation limited to determining acute life threatening conditions related to emergency care: Chest x-ray and agree with the radiologist interpretation with the following exceptions: none I personally reviewed and interpreted cardiac monitoring: Paced rhythm I personally reviewed and interpreted the pt's EKG: see above for interpretation  I have reviewed the patients home medications and made adjustments as needed Consults: Cardiology and Hospitalist Social Determinants of health:  Geriatric  Final diagnoses:  Chest pain, unspecified type  Elevated troponin    ED Discharge Orders     None          Yolande Lamar BROCKS, MD 05/30/24 2135

## 2024-05-30 NOTE — ED Triage Notes (Signed)
 Pt coming in from the grocery store today where she reports having back pain chronically.  today pt reports that the last few days her back pain has been worsening. Pt reports that all of a sudden at the grocery store she began having central chest pain. Pt is on oxygen at baseline at 4L.

## 2024-05-30 NOTE — ED Notes (Signed)
 Trop 173

## 2024-05-30 NOTE — ED Provider Triage Note (Signed)
 Emergency Medicine Provider Triage Evaluation Note  Brittany Irwin , a 83 y.o. female  was evaluated in triage.  Pt complains of sudden onset chest pain, now resolved.  Hx of pacemaker, PNA, bowel obstruciton  Review of Systems  Positive: Chest pain Negative: Fevers, chills, cough  Physical Exam  BP 108/62 (BP Location: Right Arm)   Pulse 66   Temp 98.8 F (37.1 C) (Oral)   Resp 18   Ht 5' 4 (1.626 m)   Wt 74 kg   SpO2 92%   BMI 28.00 kg/m  Gen:   Awake, no distress   Resp:  Normal effort  MSK:   Moves extremities without difficulty   Medical Decision Making  Medically screening exam initiated at 3:58 PM.  Appropriate orders placed.  Brittany Irwin was informed that the remainder of the evaluation will be completed by another provider, this initial triage assessment does not replace that evaluation, and the importance of remaining in the ED until their evaluation is complete.  Chest pain evaluation - transient, now resolved   Brittany Donnice PARAS, MD 05/30/24 [phone]

## 2024-05-31 DIAGNOSIS — I700 Atherosclerosis of aorta: Secondary | ICD-10-CM

## 2024-05-31 DIAGNOSIS — I2584 Coronary atherosclerosis due to calcified coronary lesion: Secondary | ICD-10-CM

## 2024-05-31 DIAGNOSIS — R072 Precordial pain: Secondary | ICD-10-CM

## 2024-05-31 DIAGNOSIS — N1831 Chronic kidney disease, stage 3a: Secondary | ICD-10-CM

## 2024-05-31 DIAGNOSIS — R7989 Other specified abnormal findings of blood chemistry: Secondary | ICD-10-CM | POA: Diagnosis not present

## 2024-05-31 DIAGNOSIS — I2510 Atherosclerotic heart disease of native coronary artery without angina pectoris: Secondary | ICD-10-CM | POA: Diagnosis not present

## 2024-05-31 DIAGNOSIS — I5032 Chronic diastolic (congestive) heart failure: Secondary | ICD-10-CM | POA: Diagnosis not present

## 2024-05-31 DIAGNOSIS — R0789 Other chest pain: Secondary | ICD-10-CM

## 2024-05-31 DIAGNOSIS — I480 Paroxysmal atrial fibrillation: Secondary | ICD-10-CM

## 2024-05-31 DIAGNOSIS — J449 Chronic obstructive pulmonary disease, unspecified: Principal | ICD-10-CM

## 2024-05-31 DIAGNOSIS — Z950 Presence of cardiac pacemaker: Secondary | ICD-10-CM

## 2024-05-31 DIAGNOSIS — I10 Essential (primary) hypertension: Secondary | ICD-10-CM

## 2024-05-31 LAB — COMPREHENSIVE METABOLIC PANEL WITH GFR
ALT: 13 U/L (ref 0–44)
AST: 42 U/L — ABNORMAL HIGH (ref 15–41)
Albumin: 3.3 g/dL — ABNORMAL LOW (ref 3.5–5.0)
Alkaline Phosphatase: 34 U/L — ABNORMAL LOW (ref 38–126)
Anion gap: 11 (ref 5–15)
BUN: 25 mg/dL — ABNORMAL HIGH (ref 8–23)
CO2: 28 mmol/L (ref 22–32)
Calcium: 8.6 mg/dL — ABNORMAL LOW (ref 8.9–10.3)
Chloride: 98 mmol/L (ref 98–111)
Creatinine, Ser: 1.3 mg/dL — ABNORMAL HIGH (ref 0.44–1.00)
GFR, Estimated: 41 mL/min — ABNORMAL LOW (ref >60)
Glucose, Bld: 119 mg/dL — ABNORMAL HIGH (ref 70–99)
Potassium: 3.7 mmol/L (ref 3.5–5.1)
Sodium: 137 mmol/L (ref 135–145)
Total Bilirubin: 1.3 mg/dL — ABNORMAL HIGH (ref 0.0–1.2)
Total Protein: 6.3 g/dL — ABNORMAL LOW (ref 6.5–8.1)

## 2024-05-31 LAB — MRSA NEXT GEN BY PCR, NASAL: MRSA by PCR Next Gen: NOT DETECTED

## 2024-05-31 LAB — TROPONIN I (HIGH SENSITIVITY)
Troponin I (High Sensitivity): 149 ng/L (ref <18)
Troponin I (High Sensitivity): 164 ng/L (ref <18)

## 2024-05-31 LAB — BRAIN NATRIURETIC PEPTIDE: B Natriuretic Peptide: 285.6 pg/mL — ABNORMAL HIGH (ref 0.0–100.0)

## 2024-05-31 MED ORDER — APIXABAN 5 MG PO TABS
5.0000 mg | ORAL_TABLET | Freq: Two times a day (BID) | ORAL | Status: DC
  Administered 2024-05-31 – 2024-06-01 (×3): 5 mg via ORAL
  Filled 2024-05-31 (×3): qty 1

## 2024-05-31 MED ORDER — DICLOFENAC SODIUM 1 % EX GEL
2.0000 g | Freq: Three times a day (TID) | CUTANEOUS | Status: DC | PRN
  Filled 2024-05-31 (×2): qty 100

## 2024-05-31 MED ORDER — HEPARIN (PORCINE) 25000 UT/250ML-% IV SOLN: 1000.0000 [IU]/h | INTRAVENOUS | Status: DC

## 2024-05-31 MED ORDER — METHOCARBAMOL 500 MG PO TABS
500.0000 mg | ORAL_TABLET | Freq: Three times a day (TID) | ORAL | Status: DC
  Administered 2024-05-31 – 2024-06-01 (×3): 500 mg via ORAL
  Filled 2024-05-31 (×3): qty 1

## 2024-05-31 MED ORDER — PRAMIPEXOLE DIHYDROCHLORIDE 0.25 MG PO TABS
0.5000 mg | ORAL_TABLET | Freq: Every day | ORAL | Status: DC
  Administered 2024-05-31 (×2): 0.5 mg via ORAL
  Filled 2024-05-31 (×3): qty 2

## 2024-05-31 MED ORDER — LIDOCAINE 5 % EX PTCH
1.0000 | MEDICATED_PATCH | CUTANEOUS | Status: DC
  Administered 2024-05-31: 1 via TRANSDERMAL
  Filled 2024-05-31: qty 1

## 2024-05-31 MED ORDER — MUSCLE RUB 10-15 % EX CREA
1.0000 | TOPICAL_CREAM | CUTANEOUS | Status: DC | PRN
  Administered 2024-05-31 – 2024-06-01 (×2): 1 via TOPICAL
  Filled 2024-05-31: qty 85

## 2024-05-31 MED ORDER — PNEUMOCOCCAL 20-VAL CONJ VACC 0.5 ML IM SUSY
0.5000 mL | PREFILLED_SYRINGE | INTRAMUSCULAR | Status: DC
  Filled 2024-05-31: qty 0.5

## 2024-05-31 NOTE — Progress Notes (Signed)
 Rounding Note    Patient Name: Brittany Irwin Date of Encounter: 05/31/2024  [facility] Cardiologist: LUKE BASS, DO at [facility] health  Chief Complaint: Chest pain Reason of consult: Chest pain  Subjective   No active chest pain  Out doing groceries with sister Needed to change her oxygen tank - had onset of CP took two nitroglycerin  tabs 5 minutes apart. She continued to have CP so came to the hospital for evaluation via EMS.   She lives alone No CP at baseline  Her recent discomfort was substernal  No exertional CP but does have exertional Shortness of breath No radiation    Inpatient Medications    Scheduled Meds:  ALPRAZolam   1 mg Oral BID   atenolol   25 mg Oral Daily   budesonide -glycopyrrolate -formoterol   2 puff Inhalation BID   ezetimibe   10 mg Oral QHS   fenofibrate   160 mg Oral Daily   furosemide   40 mg Oral Daily   pantoprazole   40 mg Oral Daily   pramipexole  0.5 mg Oral QHS   pregabalin   50 mg Oral BID   spironolactone   12.5 mg Oral Daily   Continuous Infusions:  heparin      PRN Meds: acetaminophen  **OR** acetaminophen , cyclobenzaprine    Vital Signs    Vitals:   05/31/24 0200 05/31/24 0745 05/31/24 0800 05/31/24 0812  BP: 104/65 134/67 120/63   Pulse: (!) 59 62    Resp: 12 14 17 18   Temp:  (!) 97.4 F (36.3 C)    TempSrc:  Oral    SpO2: 99% 100% 100%   Weight:      Height:       No intake or output data in the 24 hours ending 05/31/24 0947    05/30/2024    3:10 PM 04/07/2024    3:05 PM 03/31/2024    4:00 PM  Last 3 Weights  Weight (lbs) 163 lb 2.3 oz 165 lb 162 lb 11.2 oz  Weight (kg) 74 kg 74.844 kg 73.8 kg      Telemetry    SR  - Personally Reviewed  ECG    05/30/2024  AV paced rhythm - Personally Reviewed  Physical Exam   Physical Exam  Constitutional: No distress.  hemodynamically stable  Neck: No JVD present.  Cardiovascular: Normal rate, regular rhythm, S1 normal and S2 normal. Exam reveals no gallop,  no S3 and no S4.  No murmur heard. Pulmonary/Chest: Effort normal. No stridor. She has no wheezes. She has no rales.  Decreased breath sounds bilaterally.  Pacemaker site is well-healed.  Musculoskeletal:        General: No edema.     Cervical back: Neck supple.  Neurological: She is alert and oriented to person, place, and time.  Skin: Skin is warm and dry.    Labs    High Sensitivity Troponin:   Recent Labs  Lab 05/30/24 1532 05/30/24 1825  TROPONINIHS 173* 138*     Chemistry Recent Labs  Lab 05/30/24 1532 05/31/24 0504  NA 139 137  K 5.0 3.7  CL 99 98  CO2 27 28  GLUCOSE 107* 119*  BUN 24* 25*  CREATININE 1.40* 1.30*  CALCIUM 9.2 8.6*  PROT  --  6.3*  ALBUMIN  --  3.3*  AST  --  42*  ALT  --  13  ALKPHOS  --  34*  BILITOT  --  1.3*  GFRNONAA 38* 41*  ANIONGAP 13 11    Lipids No results for  input(s): CHOL, TRIG, HDL, LABVLDL, LDLCALC, CHOLHDL in the last 168 hours.  Hematology Recent Labs  Lab 05/30/24 1532  WBC 9.5  RBC 4.67  HGB 13.4  HCT 42.5  MCV 91.0  MCH 28.7  MCHC 31.5  RDW 14.9  PLT 219   Thyroid  No results for input(s): TSH, FREET4 in the last 168 hours.  BNPNo results for input(s): BNP, PROBNP in the last 168 hours.  DDimer No results for input(s): DDIMER in the last 168 hours.   Radiology    Chest CT without contrast (care everywhere) 12/21/2023 Heart/Vessels: Moderate coronary artery calcifications.  See report for details.   Cardiac Studies  Echo  02/2022 care everywhere Left Ventricle: There is moderate concentric hypertrophy.    Left Ventricle: Systolic function is normal. EF: 65-70%.    Left Ventricle: Doppler parameters consistent with mild diastolic  dysfunction and low to normal LA pressure.    Right Ventricle: Systolic function is normal. Normal tricuspid annular  plane systolic excursion (TAPSE) >1.7 cm.    Aortic Valve: Mild aortic valve regurgitation.   LHC 12/2016 care  everywhere  Impressions:  1. Normal coronary arteries  2.  LVEDP out of proportion to PCWP suggestive of a noncompliant left ventricle  3.  Borderline pulmonary arterial hypertension   Patient Profile     83 y.o. 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  Assessment & Plan    Precordial pain - with mixed features  Aortic atherosclerosis Coronary artery calcification Elevated troponin  EKG paced  Symptoms have mixed cardiac and non-cardiac features.  Substernal & Relieved by sublingual nitroglycerin  tablet. Outside records from care everywhere reviewed, office note dated 02/24/2024 also notes that she been having left-sided chest pain relieved by nitroglycerin  tablet.  Based on EMR stress test was ordered to evaluate for ischemia but patient states this has not been done.  Given her recurrent chest pain relieved by sublingual nitroglycerin  tablet, cardiovascular risk factors, and antianginal therapy already includes atenolol  and sublingual nitroglycerin  tablets recommended ischemic workup. Left heart catheterization with possible intervention: Patient denies for now would like to discuss it with her outpatient cardiology group.  Coronary CTA: Less than ideal in the setting of coronary calcification causing blooming artifact and also pacemaker leads.  Lexiscan stress test: Risks, benefits, alternatives discussed.  Also spoke with her sister over the phone regarding the various ischemic modalities.  They are considering it but have not made a formal decision.  All questions and concerns were addressed. N.p.o. for now Telemetry illustrates sinus rhythm with PVCs no sustained arrhythmia  Paroxysmal atrial fibrillation Present on admission Noted on prior cardiology notes CHA2DS2-VASc or 5 age, gender, hypertension, heart  failure On atenolol  and anticoagulation as outpatient, continue  Chronic HFpEF: Present on admission, noted on prior cardiology notes in Care Everywhere. Home medications include Bumex  1 mg p.o. twice daily, atenolol  25 mg p.o. daily, spironolactone  12.5 mg p.o. daily Check BNP Clinically euvolemic and not in acute exacerbation  HTN Blood pressure trend reviewed. 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   COPD with oxygen dependence:  POA  On 4 L nasal cannula oxygen at baseline Management per primary team   For questions or updates, please contact [facility] Please consult [email or web address] for contact info under   Medical Decision Making:  Complexity: high Interdisciplinary: Yes - spoke to her sister while in the room on speaker phone to discuss ischemia workup Independently reviewed:  Reviewed outside records from care everywhere-[facility] progress note 02/24/2024 Independently reviewed labs from 05/30/2024. EKG independently reviewed 05/30/2024 Prior CT chest without contrast from Care Everywhere June 2025 Prior office note from August 2025, Care Everywhere Telemetry reviewed Prescription drug management: Yes Independent historian: Sister Family updated: Yes Spoke to [employer] as well.     Signed, Madonna Michele HAS, [facility]  A Division of [facility] [address] [place], [place] [address]  Pager: [phone] Office: [phone] 05/31/2024, 9:47 AM

## 2024-05-31 NOTE — Progress Notes (Signed)
 Contacted patient's sister whom is also the legal guardian and updated her with patients location after being admitted to unit 2c.

## 2024-05-31 NOTE — Progress Notes (Signed)
 PHARMACY - ANTICOAGULATION CONSULT NOTE  Pharmacy Consult for Heparin  Indication: chest pain/ACS and atrial fibrillation  Allergies  Allergen Reactions   Methylprednisolone  Other (See Comments)    Increased BP, HR, agitation, hallucinations    Shellfish Allergy Nausea And Vomiting    All SEAFOOD   Ativan  [Lorazepam ] Other (See Comments)    Psychosis   Azulfidine [Sulfasalazine] Other (See Comments)    Headache    Epipen [Epinephrine] Hypertension   Flagyl [Metronidazole] Hives   Motrin [Ibuprofen] Nausea Only and Other (See Comments)    Told not to take medication due to GI distress caused by crohn's disease.   Nsaids Other (See Comments)    Told not to take NSAIDs due to GI distress caused by crohn's disease   Penicillins Hives    12/2021, 11/2023 tolerated cephalosporins    Morphine And Codeine  Itching    Told not to take due to GI distress caused by crohn's disease    Patient Measurements: Height: 5' 4 (162.6 cm) Weight: 74 kg (163 lb 2.3 oz) IBW/kg (Calculated) : 54.7 HEPARIN  DW (KG): 70.1  Vital Signs: Temp: 97.6 F (36.4 C) (11/11 2013) Temp Source: Oral (11/11 2013) BP: 109/65 (11/11 2000) Pulse Rate: 52 (11/11 1915)  Labs: Recent Labs    05/30/24 1532 05/30/24 1825  HGB 13.4  --   HCT 42.5  --   PLT 219  --   CREATININE 1.40*  --   TROPONINIHS 173* 138*    Estimated Creatinine Clearance: 31 mL/min (A) (by C-G formula based on SCr of 1.4 mg/dL (H)).   Medical History: Past Medical History:  Diagnosis Date   Acute CHF (congestive heart failure) (HCC) 01/02/2022   AKI (acute kidney injury) 09/08/2020   Aspiration pneumonia (HCC) 01/05/2019   Atypical chest pain 03/11/2022   Bradycardia 03/14/2020   C. difficile colitis 04/03/2021   CHF (congestive heart failure) (HCC)    COPD (chronic obstructive pulmonary disease) (HCC)    Coronary artery disease    Tachycardia 05/31/2013    Medications:  No current facility-administered medications on  file prior to encounter.   Current Outpatient Medications on File Prior to Encounter  Medication Sig Dispense Refill   acetaminophen -codeine  (TYLENOL  #4) 300-60 MG tablet Take 1 tablet by mouth 4 (four) times daily as needed (for back pain for Crohn's).     allopurinol (ZYLOPRIM) 100 MG tablet Take 50 mg by mouth daily.     ALPRAZolam  (XANAX ) 1 MG tablet Take 1 mg by mouth 2 (two) times daily.     apixaban  (ELIQUIS ) 5 MG TABS tablet Take 5 mg by mouth 2 (two) times daily.     atenolol  (TENORMIN ) 25 MG tablet Take 1 tablet (25 mg total) by mouth daily. 30 tablet 0   colestipol  (COLESTID ) 1 g tablet Take 1 tablet (1 g total) by mouth 2 (two) times daily as needed (diarrhea).     cyclobenzaprine  (FLEXERIL ) 5 MG tablet Take 5 mg by mouth daily as needed for muscle spasms.     diclofenac  Sodium (VOLTAREN  ARTHRITIS PAIN) 1 % GEL Apply 1 Application topically in the morning and at bedtime.     ezetimibe  (ZETIA ) 10 MG tablet Take 10 mg by mouth at bedtime.     fenofibrate  micronized (LOFIBRA) 200 MG capsule Take 200 mg by mouth daily.     Fluticasone -Umeclidin-Vilant (TRELEGY ELLIPTA ) 200-62.5-25 MCG/ACT AEPB Inhale 1 puff into the lungs daily. 60 each 11   furosemide  (LASIX ) 40 MG tablet Take 40 mg by mouth daily.  levalbuterol  (XOPENEX  HFA) 45 MCG/ACT inhaler Inhale 2 puffs into the lungs 2 (two) times daily as needed for shortness of breath or wheezing.     nitroGLYCERIN  (NITROSTAT ) 0.4 MG SL tablet Place 0.4 mg under the tongue every 5 (five) minutes as needed for chest pain.     omeprazole (PRILOSEC) 40 MG capsule Take 40 mg by mouth daily.     pregabalin  (LYRICA ) 50 MG capsule Take 50 mg by mouth 2 (two) times daily.     spironolactone  (ALDACTONE ) 25 MG tablet Take 0.5 tablets (12.5 mg total) by mouth daily. 30 tablet 0   STARCH-MALTO DEXTRIN (THICK-IT) POWD Make liquids nectar thick. Kindly dispense any thickening product to make liquids nectar thick consistency, 1 month supply no refills. 227  g 0   [DISCONTINUED] acidophilus (RISAQUAD) CAPS capsule Take 1 capsule by mouth 3 (three) times daily. (Patient not taking: Reported on 05/31/2024) 20 capsule 0   [DISCONTINUED] furosemide  (LASIX ) 20 MG tablet Take 1 tablet (20 mg total) by mouth daily. 3 tablet 0     Assessment: 83 y.o. female admitted with chest pain, h/o Afib and Eliquis  on hold, for heparin .  Last dose of Eliquis  taken at midnight  Goal of Therapy:  aPTT 66-102 sec Heparin  level 0.3-0.7 units/ml Monitor platelets by anticoagulation protocol: Yes   Plan:  Start heparin  1000 units/hr at noon Check aPTT in 8 hours   Kasim Mccorkle, Cordella Misty 05/31/2024,12:45 AM

## 2024-05-31 NOTE — Plan of Care (Signed)
  Problem: Clinical Measurements: Goal: Will remain free from infection Outcome: Progressing   Problem: Elimination: Goal: Will not experience complications related to urinary retention Outcome: Progressing   Problem: Safety: Goal: Ability to remain free from injury will improve Outcome: Progressing   

## 2024-05-31 NOTE — TOC CM/SW Note (Signed)
 Transition of Care [name]) - Inpatient Brief Assessment   Patient Details  Name: KAMAURI KATHOL MRN: [id number] Date of Birth: [birth year]  Transition of Care [facility]) CM/SW Contact:    Lauraine FORBES Saa, LCSWA Phone Number: 05/31/2024, 11:25 AM   Clinical Narrative:  11:25 AM Per chart review, patient at home (apartment complex not ILF). Patient has a PCP and insurance. Patient does not have SNF history. Patient has HH history with [facility]. Patient has DME (oxygen) history with [facility]. Patient's preferred pharmacy's are [facility] and [facility]. No TOC needs identified at this time. TOC will continue to follow.  Transition of Care Asessment: Insurance and Status: Insurance coverage has been reviewed Patient has primary care physician: Yes Home environment has been reviewed: Private Residence Prior level of function:: N/A Prior/Current Home Services: No current home services Social Drivers of Health Review: SDOH reviewed no interventions necessary Readmission risk has been reviewed: Yes (Currently Observation Status) Transition of care needs: no transition of care needs at this time

## 2024-05-31 NOTE — Progress Notes (Signed)
 Progress Note   Patient: Brittany Irwin FMW:[id number] DOB: Apr 22, 1941 DOA: 05/30/2024     0 DOS: the patient was seen and examined on 05/31/2024   Brief hospital course: Brittany Irwin is a 83 y.o. female with medical history significant of HFpEF, CAD, A-fib on Eliquis , tachy-brady syndrome status post PPM, COPD on 4 L home oxygen, hypertension, hyperlipidemia, anxiety, Crohn's disease, SBO, psoriasis, migraine, CKD stage IIIa, spinal stenosis/chronic back pain, neuropathy, GERD presenting with a chief complaint of chest pain.  EKG showing paced rhythm. Labs showing no leukocytosis or anemia, creatinine 1.4, troponin 173> 138. Chest x-ray showing no active cardiopulmonary disease. EDP discussed the case with cardiologist Dr. Gail who will see the patient in consultation, did not recommend starting IV heparin  for ACS at this time given flat troponin and no active chest pain admitted to TRH service.  Assessment and Plan: Chest pain Elevated troponin H/o CAD, EKG paced, chest pain relieved with NTG. Recurrent chest pains, trend troponin, continue telemetry. Cardiology recommended left heart cath which patient refused. Cardiology team discussed about coronary CTA, lexiscan. Patient did not make a decision regarding her cardiac testing. She is NPO. Plan to discharge her once ischemic work up is done.  Paroxysmal Afib- Continue Atenolol , eliquis  therapy.  Chronic diastolic heart failure- Continue home dose Bumex , beta blocker, aldactone .  Neuropathy- Severe leg pain Patient has allergy to morphine, codiene. Resumed home Lyrica , flexeril . Added Mirapex, voltaren  gel, robaxin.  Hypertension- Continue atenolol , spironolactone .  Tachybrady syndrome  S/p pacemaker.  COPD Chronuic hypoxic respiratory failure- Continue 4L supplemental oxygen. Continue home inhalers.  CKD stage 3 A- Baseline creatinine 1.2-1.5. Renal function stable, avoid nephrotoxic drugs.     Out of bed  to chair. Incentive spirometry. Nursing supportive care. Fall, aspiration precautions. Diet:  Diet Orders (From admission, onward)     Start     Ordered   05/31/24 0951  Diet NPO time specified  Diet effective now        05/31/24 0950           DVT prophylaxis:  apixaban  (ELIQUIS ) tablet 5 mg  Level of care: Progressive   Code Status: Full Code  Subjective: Patient is seen and examined today morning. She has no chest pain. Did complain of lower extremity pain attributes to neuropathy, asked for pain meds.  Physical Exam: Vitals:   05/31/24 0812 05/31/24 1000 05/31/24 1047 05/31/24 1125  BP:  96/83  125/79  Pulse:  66 60 69  Resp: 18 10 18 17   Temp:    97.9 F (36.6 C)  TempSrc:    Oral  SpO2:  99% 97% 97%  Weight:      Height:        General - Elderly Caucasian female, distress due to pain. HEENT - PERRLA, EOMI, atraumatic head, non tender sinuses. Lung - Clear, basal rales, no rhonchi, wheezes. Heart - S1, S2 heard, no murmurs, rubs, trace pedal edema. Abdomen - Soft, non tender, bowel sounds good Neuro - Alert, awake and oriented, non focal exam. Skin - Warm and dry.  Data Reviewed:      Latest Ref Rng & Units 05/30/2024    3:32 PM 04/07/2024    3:12 PM 04/03/2024    4:59 AM  CBC  WBC 4.0 - 10.5 K/uL 9.5  6.9  5.0   Hemoglobin 12.0 - 15.0 g/dL 86.5  89.0  89.7   Hematocrit 36.0 - 46.0 % 42.5  35.6  32.8   Platelets 150 - 400  K/uL 219  211  173       Latest Ref Rng & Units 05/31/2024    5:04 AM 05/30/2024    3:32 PM 04/07/2024    3:12 PM  BMP  Glucose 70 - 99 mg/dL 880  892  890   BUN 8 - 23 mg/dL 25  24  23    Creatinine 0.44 - 1.00 mg/dL 8.69  8.59  8.64   Sodium 135 - 145 mmol/L 137  139  142   Potassium 3.5 - 5.1 mmol/L 3.7  5.0  3.9   Chloride 98 - 111 mmol/L 98  99  105   CO2 22 - 32 mmol/L 28  27  27    Calcium 8.9 - 10.3 mg/dL 8.6  9.2  8.7    DG Chest 2 View Result Date: 05/30/2024 CLINICAL DATA:  Chest pain and shortness of breath.  EXAM: CHEST - 2 VIEW COMPARISON:  Chest radiograph dated 04/07/2024. FINDINGS: No focal consolidation, pleural effusion, pneumothorax. Stable cardiac silhouette. Left pectoral pacemaker device. No acute osseous pathology. IMPRESSION: No active cardiopulmonary disease. Electronically Signed   By: Vanetta Chou M.D.   On: 05/30/2024 17:14    Family Communication: Discussed with patient, understand and agree. All questions answered.  Disposition: Status is: Observation The patient remains OBS appropriate and will d/c before 2 midnights.  Planned Discharge Destination: Home     Time spent: 44 minutes  Author: Concepcion Riser, MD 05/31/2024 2:04 PM Secure chat 7am to 7pm For on call review [email or web address].

## 2024-06-01 ENCOUNTER — Other Ambulatory Visit (HOSPITAL_COMMUNITY): Payer: Self-pay

## 2024-06-01 ENCOUNTER — Telehealth (HOSPITAL_COMMUNITY): Payer: Self-pay | Admitting: Pharmacy Technician

## 2024-06-01 DIAGNOSIS — R072 Precordial pain: Secondary | ICD-10-CM | POA: Diagnosis not present

## 2024-06-01 DIAGNOSIS — R0789 Other chest pain: Secondary | ICD-10-CM | POA: Diagnosis not present

## 2024-06-01 DIAGNOSIS — I10 Essential (primary) hypertension: Secondary | ICD-10-CM

## 2024-06-01 DIAGNOSIS — I700 Atherosclerosis of aorta: Secondary | ICD-10-CM | POA: Diagnosis not present

## 2024-06-01 DIAGNOSIS — N1831 Chronic kidney disease, stage 3a: Secondary | ICD-10-CM | POA: Diagnosis not present

## 2024-06-01 DIAGNOSIS — Z7901 Long term (current) use of anticoagulants: Secondary | ICD-10-CM

## 2024-06-01 DIAGNOSIS — Z950 Presence of cardiac pacemaker: Secondary | ICD-10-CM | POA: Diagnosis not present

## 2024-06-01 DIAGNOSIS — I2510 Atherosclerotic heart disease of native coronary artery without angina pectoris: Secondary | ICD-10-CM | POA: Diagnosis not present

## 2024-06-01 DIAGNOSIS — R7989 Other specified abnormal findings of blood chemistry: Secondary | ICD-10-CM | POA: Diagnosis not present

## 2024-06-01 LAB — CBC
HCT: 41.5 % (ref 36.0–46.0)
Hemoglobin: 13.3 g/dL (ref 12.0–15.0)
MCH: 28.3 pg (ref 26.0–34.0)
MCHC: 32 g/dL (ref 30.0–36.0)
MCV: 88.3 fL (ref 80.0–100.0)
Platelets: 211 K/uL (ref 150–400)
RBC: 4.7 MIL/uL (ref 3.87–5.11)
RDW: 14.4 % (ref 11.5–15.5)
WBC: 6.9 K/uL (ref 4.0–10.5)
nRBC: 0 % (ref 0.0–0.2)

## 2024-06-01 LAB — BASIC METABOLIC PANEL WITH GFR
Anion gap: 11 (ref 5–15)
BUN: 25 mg/dL — ABNORMAL HIGH (ref 8–23)
CO2: 26 mmol/L (ref 22–32)
Calcium: 9.2 mg/dL (ref 8.9–10.3)
Chloride: 97 mmol/L — ABNORMAL LOW (ref 98–111)
Creatinine, Ser: 1.29 mg/dL — ABNORMAL HIGH (ref 0.44–1.00)
GFR, Estimated: 42 mL/min — ABNORMAL LOW (ref >60)
Glucose, Bld: 110 mg/dL — ABNORMAL HIGH (ref 70–99)
Potassium: 3.9 mmol/L (ref 3.5–5.1)
Sodium: 134 mmol/L — ABNORMAL LOW (ref 135–145)

## 2024-06-01 NOTE — TOC Transition Note (Signed)
 Transition of Care [facility]) - Discharge Note   Patient Details  Name: Brittany Irwin MRN: [id number] Date of Birth: 1941-05-05  Transition of Care [facility]) CM/SW Contact:  Roxie KANDICE Stain, RN Phone Number: 06/01/2024, 12:16 PM   Clinical Narrative:    Brittany Irwin is stable to discharge home.  No ICM (Inpatient Care Management) needs at this time.   Final next level of care: Home/Self Care Barriers to Discharge: Barriers Resolved   Patient Goals and CMS Choice Patient states their goals for this hospitalization and ongoing recovery are:: return home          Discharge Placement               Home        Discharge Plan and Services Additional resources added to the After Visit Summary for                                       Social Drivers of Health (SDOH) Interventions SDOH Screenings   Food Insecurity: No Food Insecurity (05/31/2024)  Housing: Low Risk  (05/31/2024)  Transportation Needs: No Transportation Needs (05/31/2024)  Utilities: Not At Risk (05/31/2024)  Financial Resource Strain: Low Risk  (10/14/2023)   Received from [facility]  Recent Concern: Financial Resource Strain - Medium Risk (07/27/2023)   Received from [facility]  Physical Activity: Insufficiently Active (07/27/2023)   Received from [facility]  Social Connections: Unknown (05/31/2024)  Recent Concern: Social Connections - Socially Isolated (05/31/2024)  Stress: No Stress Concern Present (07/27/2023)   Received from [facility]  Tobacco Use: Medium Risk (05/30/2024)     Readmission Risk Interventions    04/03/2024   10:40 AM 10/02/2023    2:35 PM 09/07/2023    3:27 PM  Readmission Risk Prevention Plan  Transportation Screening Complete Complete Complete  PCP or Specialist Appt within 3-5 Days   Complete  HRI or Home Care Consult  Complete Complete  Social Work Consult for Recovery Care Planning/Counseling  Complete Complete  Palliative Care Screening  Not  Applicable Not Applicable  Medication Review [facility]) Referral to Pharmacy Complete Complete  PCP or Specialist appointment within 3-5 days of discharge Complete    HRI or Home Care Consult Complete    SW Recovery Care/Counseling Consult Complete    Palliative Care Screening Not Applicable    Skilled Nursing Facility Not Applicable

## 2024-06-01 NOTE — Care Management Obs Status (Addendum)
 MEDICARE OBSERVATION STATUS NOTIFICATION   Patient Details  Name: Brittany Irwin MRN: [id number] Date of Birth: 1940-12-15   Medicare Observation Status Notification Given:    Spoke with the patient and obs notice explained but patient could not sign copy  provided.    [name] 06/01/2024, 12:05 PM

## 2024-06-01 NOTE — Care Management Obs Status (Signed)
 MEDICARE OBSERVATION STATUS NOTIFICATION   Patient Details  Name: Brittany Irwin MRN: [id number] Date of Birth: 1940-09-22   Medicare Observation Status Notification Given:  Yes  Obs notice signed and copy given.  Otha Monical 06/01/2024, 10:05 AM

## 2024-06-01 NOTE — Plan of Care (Signed)

## 2024-06-01 NOTE — Discharge Summary (Signed)
 Physician Discharge Summary   Patient: Brittany Irwin MRN: [id number] DOB: November 13, 1940  Admit date:     05/30/2024  Discharge date: {dischdate:26783}  Discharge Physician: Concepcion Riser   PCP: [name], MD   Recommendations at discharge:  {Tip this will not be part of the note when signed- Example include specific recommendations for outpatient follow-up, pending tests to follow-up on. (Optional):26781}  ***  Discharge Diagnoses: Principal Problem:   Chest pain Active Problems:   COPD (chronic obstructive pulmonary disease) (HCC)   Chronic kidney disease, stage 3a (HCC)   (HFpEF) heart failure with preserved ejection fraction (HCC)   Atrial fibrillation (HCC)   Atherosclerosis of aorta   Calcification of native coronary artery   Pacemaker   Benign hypertension  Resolved Problems:   * No resolved hospital problems. [place] [facility] Course: No notes on file  Assessment and Plan: No notes have been filed under this hospital service. Service: Hospitalist     {Tip this will not be part of the note when signed Body mass index is 28 kg/m. , ,  (Optional):26781}  {(NOTE) Pain control PDMP Statment (Optional):26782} Consultants: *** Procedures performed: ***  Disposition: {Plan; Disposition:26390} Diet recommendation:  Discharge Diet Orders (From admission, onward)     Start     Ordered   06/01/24 0000  Diet - low sodium heart healthy        06/01/24 1213           {Diet_Plan:26776} DISCHARGE MEDICATION: Allergies as of 06/01/2024       Reactions   Methylprednisolone  Other (See Comments)   Increased BP, HR, agitation, hallucinations    Shellfish Allergy Nausea And Vomiting   All SEAFOOD   Ativan  [lorazepam ] Other (See Comments)   Psychosis   Azulfidine [sulfasalazine] Other (See Comments)   Headache    Epipen [epinephrine] Hypertension   Flagyl [metronidazole] Hives   Motrin [ibuprofen] Nausea Only, Other (See Comments)   Told not to take  medication due to GI distress caused by crohn's disease.   Nsaids Other (See Comments)   Told not to take NSAIDs due to GI distress caused by crohn's disease   Penicillins Hives   12/2021, 11/2023 tolerated cephalosporins    Morphine And Codeine  Itching   Told not to take due to GI distress caused by crohn's disease        Medication List     TAKE these medications    acetaminophen -codeine  300-60 MG tablet Commonly known as: TYLENOL  #4 Take 1 tablet by mouth 4 (four) times daily as needed (for back pain for Crohn's).   allopurinol 100 MG tablet Commonly known as: ZYLOPRIM Take 50 mg by mouth daily.   ALPRAZolam  1 MG tablet Commonly known as: XANAX  Take 1 mg by mouth 2 (two) times daily.   apixaban  5 MG Tabs tablet Commonly known as: ELIQUIS  Take 5 mg by mouth 2 (two) times daily.   atenolol  25 MG tablet Commonly known as: Tenormin  Take 1 tablet (25 mg total) by mouth daily.   colestipol  1 g tablet Commonly known as: COLESTID  Take 1 tablet (1 g total) by mouth 2 (two) times daily as needed (diarrhea).   cyclobenzaprine  5 MG tablet Commonly known as: FLEXERIL  Take 5 mg by mouth daily as needed for muscle spasms.   ezetimibe  10 MG tablet Commonly known as: ZETIA  Take 10 mg by mouth at bedtime.   fenofibrate  micronized 200 MG capsule Commonly known as: LOFIBRA Take 200 mg by mouth daily.   furosemide   40 MG tablet Commonly known as: LASIX  Take 40 mg by mouth daily.   levalbuterol  45 MCG/ACT inhaler Commonly known as: XOPENEX  HFA Inhale 2 puffs into the lungs 2 (two) times daily as needed for shortness of breath or wheezing.   nitroGLYCERIN  0.4 MG SL tablet Commonly known as: NITROSTAT  Place 0.4 mg under the tongue every 5 (five) minutes as needed for chest pain.   omeprazole 40 MG capsule Commonly known as: PRILOSEC Take 40 mg by mouth daily.   pregabalin  50 MG capsule Commonly known as: LYRICA  Take 50 mg by mouth 2 (two) times daily.    spironolactone  25 MG tablet Commonly known as: Aldactone  Take 0.5 tablets (12.5 mg total) by mouth daily.   Thick-It Powd Generic drug: STARCH-MALTO DEXTRIN Make liquids nectar thick. Kindly dispense any thickening product to make liquids nectar thick consistency, 1 month supply no refills.   Trelegy Ellipta  200-62.5-25 MCG/ACT Aepb Generic drug: Fluticasone -Umeclidin-Vilant Inhale 1 puff into the lungs daily.   Voltaren  Arthritis Pain 1 % Gel Generic drug: diclofenac  Sodium Apply 1 Application topically in the morning and at bedtime.        Discharge Exam: Filed Weights   05/30/24 1510  Weight: 74 kg   ***  Condition at discharge: {DC Condition:26389}  The results of significant diagnostics from this hospitalization (including imaging, microbiology, ancillary and laboratory) are listed below for reference.   Imaging Studies: DG Chest 2 View Result Date: 05/30/2024 CLINICAL DATA:  Chest pain and shortness of breath. EXAM: CHEST - 2 VIEW COMPARISON:  Chest radiograph dated 04/07/2024. FINDINGS: No focal consolidation, pleural effusion, pneumothorax. Stable cardiac silhouette. Left pectoral pacemaker device. No acute osseous pathology. IMPRESSION: No active cardiopulmonary disease. Electronically Signed   By: Vanetta Chou M.D.   On: 05/30/2024 17:14    Microbiology: Results for orders placed or performed during the hospital encounter of 05/30/24  MRSA Next Gen by PCR, Nasal     Status: None   Collection Time: 05/31/24 11:40 AM   Specimen: Nasal Mucosa; Nasal Swab  Result Value Ref Range Status   MRSA by PCR Next Gen NOT DETECTED NOT DETECTED Final    Comment: (NOTE) The GeneXpert MRSA Assay (FDA approved for NASAL specimens only), is one component of a comprehensive MRSA colonization surveillance program. It is not intended to diagnose MRSA infection nor to guide or monitor treatment for MRSA infections. Test performance is not FDA approved in patients less than 50  years old. Performed at [facility], [address], [place], [place] [address]     Labs: CBC: Recent Labs  Lab 05/30/24 1532 06/01/24 0210  WBC 9.5 6.9  HGB 13.4 13.3  HCT 42.5 41.5  MCV 91.0 88.3  PLT 219 211   Basic Metabolic Panel: Recent Labs  Lab 05/30/24 1532 05/31/24 0504 06/01/24 0210  NA 139 137 134*  K 5.0 3.7 3.9  CL 99 98 97*  CO2 27 28 26   GLUCOSE 107* 119* 110*  BUN 24* 25* 25*  CREATININE 1.40* 1.30* 1.29*  CALCIUM 9.2 8.6* 9.2   Liver Function Tests: Recent Labs  Lab 05/31/24 0504  AST 42*  ALT 13  ALKPHOS 34*  BILITOT 1.3*  PROT 6.3*  ALBUMIN 3.3*   CBG: No results for input(s): GLUCAP in the last 168 hours.  Discharge time spent: {LESS THAN/GREATER UYJW:73611} 30 minutes.  Signed: Concepcion Riser, MD [facility] 06/01/2024

## 2024-06-01 NOTE — Progress Notes (Signed)
 AVS gone over with patient, updated patients sister/legal guardian and patient was wheeled out to sisters vehicle where she was connected to her home oxygen.

## 2024-06-01 NOTE — Telephone Encounter (Signed)
 Patient Product/process Development Scientist completed.    The patient is insured through [facility]. Patient has Medicare and is not eligible for a copay card, but may be able to apply for patient assistance or Medicare RX Payment Plan (Patient Must reach out to their plan, if eligible for payment plan), if available.    Ran test claim for Eliquis  5 mg and the current 30 day co-pay is $45.00.  Ran test claim for Breztri  160-9-4.8 mcg and the current 30 day co-pay is $45.00.   This test claim was processed through [facility]- copay amounts may vary at other pharmacies due to pharmacy/plan contracts, or as the patient moves through the different stages of their insurance plan.     Reyes Sharps, CPHT Pharmacy Technician Patient Advocate Specialist [facility] Patient Advocate Team Direct Number: [phone]  Fax: [phone]

## 2024-06-01 NOTE — Progress Notes (Signed)
 Mobility Specialist Progress Note;   06/01/24 0857  Mobility  Activity Ambulated with assistance  Level of Assistance Contact guard assist, steadying assist  Assistive Device Front wheel walker  Distance Ambulated (ft) 80 ft  Activity Response Tolerated well  Mobility Referral Yes  Mobility visit 1 Mobility  Mobility Specialist Start Time (ACUTE ONLY) 0857  Mobility Specialist Stop Time (ACUTE ONLY) 0912  Mobility Specialist Time Calculation (min) (ACUTE ONLY) 15 min   Pt agreeable to mobility despite pain. Required MinG for all safe mobility. Ambulated on 4LO2, VSS throughout. Main c/o during session was back pain. Pt returned back to bed and left with all needs met, alarm on.   Lauraine Erm Mobility Specialist Please contact via SecureChat or [employer] [phone]

## 2024-06-01 NOTE — Progress Notes (Addendum)
 Progress Note  Patient Name: Brittany Irwin Date of Encounter: 06/01/2024 [facility] Cardiologist: LUKE BASS, DO   Interval Summary   Patient denies any active chest pain Biggest concern is chronic back pain which makes moving difficult  Wants to continue workup with her cardiologists at [facility] with her sister, [name], on the phone who agrees with this plan   Vital Signs Vitals:   06/01/24 0754 06/01/24 0823 06/01/24 1003 06/01/24 1140  BP:  133/63 133/63 112/79  Pulse:  63 69 72  Resp:  13  14  Temp:  97.9 F (36.6 C)  98.1 F (36.7 C)  TempSrc:  Oral  Oral  SpO2: 95% 98%  98%  Weight:      Height:       Intake/Output Summary (Last 24 hours) at 06/01/2024 1321 Last data filed at 06/01/2024 0755 Gross per 24 hour  Intake --  Output 575 ml  Net -575 ml      05/30/2024    3:10 PM 04/07/2024    3:05 PM 03/31/2024    4:00 PM  Last 3 Weights  Weight (lbs) 163 lb 2.3 oz 165 lb 162 lb 11.2 oz  Weight (kg) 74 kg 74.844 kg 73.8 kg     Telemetry/ECG  AV paced - Personally Reviewed  Physical Exam  GEN: No acute distress, on 5 L oxygen.   Neck: No JVD Cardiac: RRR, no murmurs, rubs, or gallops.  Respiratory: decreased breath sounds bilaterally. GI: Soft, nontender, non-distended  MS: No edema  Assessment & Plan   Chest pain Coronary artery calcifications Elevated troponin  Presented to ED with chest pain with mixed features Stress test 02/2022: normal perfusion  LHC 12/2016: normal coronary arteries  Troponin 173 ? 138 ? 149 ? 164 Discussed multiple modalities for cardiac testing for chest pain  She tells me that she would prefer to follow up closely with [facility] cardiology for further workup and defers workup for now with [facility]  I spoke with her sister, [name] this morning who is aware of this plan and agrees with this plan and is appreciative of the call with information  Will add diet order back and inform primary team  Paroxysmal A-Fib Known  history of PAF Continue PTA atenonol 25 mg daily  Continue PTA Eliquis  5 mg BID  Chronic HFpEF Hypertension  Echo 02/2024: LVEF 60-65%, normal RV function BNP 285 (appears to be chronically elevated) Appears euvolemic on exam BP normotensive and stable Renal function stable  Continue PO Lasix  40 mg daily Continue spironolactone  12.5 mg daily  Continue atenonol 25 mg daily   Tachy-brady syndrome s/p PPM Follows closely with [facility]    For questions or updates, please contact [facility] Please consult [email or web address] for contact info under      Signed, Waddell DELENA Donath, PA-C   ADDENDUM:   Patient seen and examined with Waddell DELENA Donath, PA-C.  I personally taken a history, examined the patient, reviewed relevant notes,  laboratory data / imaging studies.  I performed a substantive portion of this encounter and formulated the important aspects of the plan.  I agree with the APP's note, impression, and recommendations; however, I have edited the note to reflect changes or salient points.   Patient seen and examined at bedside. No more chest pain overnight. Predominantly back pain improved with pain medications  PHYSICAL EXAM: Today's Vitals   06/01/24 0754 06/01/24 0823 06/01/24 1003 06/01/24 1140  BP:  133/63 133/63 112/79  Pulse:  63 69 72  Resp:  13  14  Temp:  97.9 F (36.6 C)  98.1 F (36.7 C)  TempSrc:  Oral  Oral  SpO2: 95% 98%  98%  Weight:      Height:      PainSc:       Body mass index is 28 kg/m.   Net IO Since Admission: -575 mL [06/01/24 1321]  Filed Weights   05/30/24 1510  Weight: 74 kg    Physical Exam  Constitutional: No distress.  hemodynamically stable  Neck: No JVD present.  Cardiovascular: Normal rate, regular rhythm, S1 normal and S2 normal. Exam reveals no gallop, no S3 and no S4.  No murmur heard. Pulmonary/Chest: Effort normal. No stridor. She has no wheezes. She has no rales.  Pacemaker site is well-healed.   Musculoskeletal:        General: No edema.     Cervical back: Neck supple.  Neurological: She is alert and oriented to person, place, and time.  Skin: Skin is warm and dry.    EKG: (personally reviewed by me) No new tracings  Telemetry: (personally reviewed by me) Paced rhythm   Impression & Recommendations: :  Precordial pain. Aortic atherosclerosis. Coronary calcification. Elevated troponins EKG is paced. Symptoms are mixed with both cardiac and noncardiac features. Receives majority of the care at [facility] cardiology. Records available in Care Everywhere were reviewed. At the last office note in August 2025 patient was recommended to undergo stress test due to similar left-sided chest pain relieved by nitro. Given her presentation during this hospitalization recommended the same-pharmacological stress. Patient felt apprehensive to undergo testing despite having a lengthy discussion with her with regards to risks, benefits, and alternatives.  I also spoke to her legal guardian which is her sister yesterday.  Her sister was agreeable to undergo stress test but wanted Ms.Rasch to make that informed decision. We had kept her n.p.o. for potential stress test today. However, since she has not had any reoccurrence of CP since coming here and therefore, she wants to hold off on ischemic evaluation during this hospitalization and she will follow-up with her cardiologist at [facility]. All questions and concerns were addressed. Telemetrypaced rhythm-no sustained arrhythmia Recommend she follows up with her primary cardiologist in 2 weeks postdischarge  Paroxysmal atrial fibrillation: Remains in sinus rhythm. Continue outpatient medications  Chronic HFpEF. Not in overt heart failure. Euvolemic. Continue home medications. Follow-up outpatient with cardiology in 2 weeks postdischarge as mentioned above   This note was created using a voice recognition software as a result there may be  grammatical errors inadvertently enclosed that do not reflect the nature of this encounter. Every attempt is made to correct such errors.   Madonna Michele HAS, [facility]  A Division of [facility] [address] [place], [place] [address]  Pager: [phone] Office: [phone] 06/01/2024 1:21 PM

## 2024-06-01 NOTE — Evaluation (Signed)
 Occupational Therapy Evaluation Patient Details Name: Brittany Irwin MRN: [id number] DOB: [birth year] Today's Date: 06/01/2024   History of Present Illness   83 y.o. female admitted 05/30/24 for evaluation of chest pain. EKG showing paced rhythm. Labs showing no leukocytosis or anemia, creatinine 1.4, troponin 173> 138. Chest x-ray showing no active cardiopulmonary disease. Cardiology recommended left heart cath which patient refused, she prefers to continue workup through [facility] - which is her typical cardiologist. PMH includes tachy brady syndrome s/p MDT DC PM 02/22/2023, CAD, paroxysmal atrial fibrillation,, HFpEF, HCM, HTN, COPD on oxygen, CKD and Crohn's disease     Clinical Impressions Pt lives alone and is typically mod I for ADL and mobility with Rollator. Her sister drives her to appointments and out in the community but she does her own cooking/cleaning medical and fincancial management. She is a retired hotel manager. Today she presents at baseline for ADL and mobility, and VSS throughout session on 4LO2 which is her baseline. She was able to demonstrate LB dressing, transfers, hallway ambulation and standing tolerance for cooking/grooming etc. She does express the need for a shower chair that will fit in her tub. Apparently she got a tub bench and it is too big for her bathroom hindering mobility and does not go all the way in making it useless. OT education complete, and will sign off at this time.     If plan is discharge home, recommend the following:   Assist for transportation     Functional Status Assessment   Patient has had a recent decline in their functional status and demonstrates the ability to make significant improvements in function in a reasonable and predictable amount of time.     Equipment Recommendations   Tub/shower seat (needs one to fit IN THE TUB)     Recommendations for Other Services         Precautions/Restrictions    Precautions Precautions: Fall Recall of Precautions/Restrictions: Intact Restrictions Weight Bearing Restrictions Per Provider Order: No Other Position/Activity Restrictions: 4L O2 at baseline     Mobility Bed Mobility Overal bed mobility: Modified Independent             General bed mobility comments: increased time and effort but no assist needed    Transfers Overall transfer level: Needs assistance Equipment used: Rolling walker (2 wheels) Transfers: Sit to/from Stand Sit to Stand: Supervision           General transfer comment: vc for safe hand placement. Pt typically uses Rollator, no assist to power up      Balance Overall balance assessment: Mild deficits observed, not formally tested                                         ADL either performed or assessed with clinical judgement   ADL Overall ADL's : At baseline                                       General ADL Comments: able to demonstrate UB and LB ADL, Pt able to demonstrate standing tolerance for grooming tasks, self-feeding at end of session.     Vision Ability to See in Adequate Light: 0 Adequate Patient Visual Report: No change from baseline Vision Assessment?: No apparent visual deficits     Perception  Praxis         Pertinent Vitals/Pain Pain Assessment Pain Assessment: 0-10 Pain Score: 3  Pain Location: lower back/generalized Pain Descriptors / Indicators: Aching, Discomfort, Constant Pain Intervention(s): Monitored during session, Repositioned, Premedicated before session     Extremity/Trunk Assessment Upper Extremity Assessment Upper Extremity Assessment: Overall WFL for tasks assessed   Lower Extremity Assessment Lower Extremity Assessment: Overall WFL for tasks assessed   Cervical / Trunk Assessment Cervical / Trunk Assessment: Normal (hx of chronic back pain)   Communication Communication Communication: No apparent  difficulties   Cognition Arousal: Alert Behavior During Therapy: WFL for tasks assessed/performed Cognition: No apparent impairments                               Following commands: Intact       Cueing  General Comments   Cueing Techniques: Verbal cues  VSS on 4L O2 throughout session   Exercises     Shoulder Instructions      Home Living Family/patient expects to be discharged to:: Private residence Living Arrangements: Alone Available Help at Discharge: Family;Available PRN/intermittently Type of Home: Apartment Home Access: Level entry     Home Layout: One level     Bathroom Shower/Tub: Chief Strategy Officer: Standard Bathroom Accessibility: Yes How Accessible: Accessible via walker Home Equipment: Rolling Walker (2 wheels);Rollator (4 wheels);Grab bars - tub/shower;Hand held shower head;Other (comment) (supplemental O2)          Prior Functioning/Environment Prior Level of Function : Independent/Modified Independent;Driving             Mobility Comments: 4L O2 baseline, needs rollator at times ADLs Comments: sister provides transportation, ind with ADLs, cooks    OT Problem List: Decreased activity tolerance;Decreased knowledge of use of DME or AE   OT Treatment/Interventions:        OT Goals(Current goals can be found in the care plan section)   Acute Rehab OT Goals Patient Stated Goal: get a shower chair for safety OT Goal Formulation: With patient Time For Goal Achievement: 06/15/24 Potential to Achieve Goals: Good   OT Frequency:       Co-evaluation              AM-PAC OT 6 Clicks Daily Activity     Outcome Measure Help from another person eating meals?: None Help from another person taking care of personal grooming?: None Help from another person toileting, which includes using toliet, bedpan, or urinal?: None Help from another person bathing (including washing, rinsing, drying)?: A Little Help  from another person to put on and taking off regular upper body clothing?: None Help from another person to put on and taking off regular lower body clothing?: None 6 Click Score: 23   End of Session Equipment Utilized During Treatment: Gait belt;Rolling walker (2 wheels);Oxygen (4L) Nurse Communication: Mobility status;Precautions  Activity Tolerance: Patient tolerated treatment well Patient left: in chair;with call bell/phone within reach (verbalized consent to not get up without assist)  OT Visit Diagnosis: Muscle weakness (generalized) (M62.81)                Time: 8984-8953 OT Time Calculation (min): 31 min Charges:  OT General Charges $OT Visit: 1 Visit OT Evaluation $OT Eval Low Complexity: 1 Low OT Treatments $Self Care/Home Management : 8-22 mins  Brittany Irwin OTR/L Acute Rehabilitation Services Office: [phone]   Brittany Irwin [facility] 06/01/2024, 11:07 AM

## 2024-06-01 NOTE — Progress Notes (Signed)
 PT Cancellation Note  Patient Details Name: Brittany Irwin MRN: [id number] DOB: 1941-06-09   Cancelled Treatment:    Reason Eval/Treat Not Completed: PT screened, no needs identified, will sign off. Pt evaled by OT who reports pt is at baseline.    Erven Sari Shaker 06/01/2024, 11:24 AM

## 2024-06-09 NOTE — Progress Notes (Signed)
 Return Patient Visit  Referring Physician:     Karolee Ami BRAVO, MD  PCP:       Ami BRAVO Karolee, MD Primary SCS Pulmonologist:  Charlena Blacker, MD Supervising/Today's Pulmonologist: Odis Floras, MD Adv Practice Provider:   None  Medical Scribe (if present):   Carlos Molt I dictated note to Carlos Molt who was acting as my scribe. I agree with above documentation, which was reviewed for accuracy and completeness.  Obtaining the history, performing the physical exam, and formulating the assessment and plan were performed by me Morene JINNY Floras, MD.    Documentation for time-based billing:  Total time spent of date of service was 31 minutes.  Patient care activities included preparing to see the patient such as reviewing the patient record, obtaining and/or reviewing separately obtained history, performing a medically appropriate history and physical examination, counseling and educating the patient, family, and/or caregiver, ordering prescription medications, tests, or procedures, referring and communicating with other health care providers when not separately reported during the visit, documenting clinical information in the electronic or other health record, independently interpreting results when not separately reported, communicating results to the patient/family/caregiver and coordinating the care of the patient when not separately reported. Assessment     ICD-10-CM   1. Hospital discharge follow-up  Z09 Ambulatory Referral to Speech Therapy Evaluation and Treatment    2. Stage 2 moderate COPD by GOLD classification (*)  J44.9 DME-AMBULATORY    3. Chronic respiratory failure with hypoxia (*)  J96.11 DME-AMBULATORY    4. Lung nodules  R91.8     5. Cigarette nicotine dependence in remission  F17.211     6. Aspiration into airway, sequela  T17.908S Ambulatory Referral to Speech Therapy Evaluation and Treatment       [facility] Discharge Follow up Pulmonary  Infiltrates Suspected aspiration pneumonia requring hospitalization.  Patient was admitted to [facility] system on 09/30/2023 - 10/05/2023 for AMS secondary to pneumonia.  Recurrent admissions over summer, at least twice for aspiration pneumonia. Aspiration precautions and thickener for liqiuds.  Will send request for ongoing speech eval and treat swallowing concerns.    3. Hypoxemia  4. Stage 2 moderate COPD by GOLD classification (*) (Primary) Continue Trelegy 200, 1 puff qd.  Patient aware to rinse mouth out after each use.  Samples provided in office.  Continue Xopenex  HFA vs nebs q4-6hrs PRN.  Continue oxygen supplementation at 4LPM continuously.  Continue to monitor O2 sats with goal being >88%.   POC and tank O2 to keep O2 sat > 90 % at rest and with exertion.   They can call and give me the name of another POC which they prefer and I can resquest this through their DME.  However, it is unclear if this will make a difference in getting that preferred product.   5. Chronic diastolic heart failure (*) 6. Hypertrophic cardiomyopathy 7. Tachy-brady syndrome S/p pacemaker implant.  Followed by Cardiology.  Has appt in near future to discuss chest pain responsive ot   8. Cigarette nicotine dependence in remission Pt is a former smoker with a 68 pack-year smoking hx. Quit in 2000.  No longer LDCT screening due to age and time since quitting.   Follow up in about 3 months (around 09/09/2024). (patient will call sooner if symptoms change). HPI  Chief Complaint: Shortness of breath  Brittany Irwin is a 83 y.o. female who presents for a return visit regarding COPD and chronic hypoxia. Pt was last  seen in clinic on 4:30/25  Today patient reports that she has been in and out of the hospital all year. List of history of hospital admissions and ED visits are listed below: 02/14/24: Hospital Admission 02/28/24: Hospital Admission 03/22/24: Hospital Admission 04/07/24: ER  visit 05/30/24: for chest pain   Patient is accompanied by a family member/friend who offers additional information. [name] reports being maintained on Trelegy, Xopenex  nebulizer treatments. Patient uses supplemental oxygen continuously on 4LPM. Patient inquires about getting an order for a portable oxygen tank. Family member reports that due to the patient being on 4L of continuous oxygen so her oxygen tanks do not last the duration of time expected. Family member notes that the patient has had the pulsed dose supplemental oxygen and she does not like it. Patient expresses her concern for running out of oxygen while traveling to appointments. Patient notes the roof of her mouth is always very dry.    Patient recently underwent Barium swallow test which showed aspiration of liquids. Patient notes that she tries to use the thickener, but she is unable to tolerate it. She notes that she has tried to measure the appropriate amount of thickener to use, but it has caused her to vomit.   Patient notes being placed on Eliquis  after being diagnosed with Afib. During hospitalization the patient was offered options for further diagnostic testing for her Cardiac health. Patient politely declined because she wanted to be seen and evaluated by her own care team. Patient has a visit with Cardiology scheduled.   Patient is currently living at home in [place].    09/27/23 She is now on 4LPM of O2 continuously.   Patient was admitted to [facility] system on 09/30/2023 - 10/05/2023 for AMS secondary to pneumonia. Patient underwent a CT Chest on 09/30/23 while admitted showing RML and RLL infiltrates and was discharged with Xopenex  of note.  Patient states she is currently back at her baseline. Notes she still coughs with clear sputum production. Also states she chokes when consuming liquid. States she is going to try and drink fluid slower. Patient has been using her inhaler therapy as prescribed.  Notes she uses her Xopenex  rescue inhaler somewhat frequently. Denies wheezing, fevers, chills. Denies any pulmonary complaints or symptoms otherwise.   09/22/23: Brittany Irwin is a 83 y.o. female who presents for a return visit regarding COPD and chronic hypoxia. Pt was last seen in clinic on 02/11/23.  Today in clinic pt reports she continues to struggle with cough and colored sputum production. She is recovering from COVID-19 infection from Feb 2025 requiring a hospitalization. She is now on 4LPM of O2 continuously. She reports she does not tolerate Prednisone very well.   02/11/23: Brittany Irwin is a 83 y.o. female who presents for a return visit regarding COPD. Patient was last seen 03/04/2022 and is a former smoker. She has history of HOCM, chronic diastolic heart failure and tachy-brady syndrome with plans for pacemaker placement 02/22/23. She has requested today's visit as a way to check her pulmonary status before her upcoming procedure. She has noticed that her SpO2 levels drop to the 70s or 80s when she is up and moving around. She has had to increase her oxygen supplementation to 5L. She denies frequent wheeze or cough. Using Trelegy daily. Has some hoarseness. Using Xopenex  sometimes up to twice daily. No exacerbations in the last year.   She is also complaining of frequent sores in her nares, right greater than left.  Using AYR gel multiple times daily. Has not discussed with her PCP.    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 CT Scans: DATE: 09/30/2023, CTPA, CT CHEST without contrast shows Peripheral airspace disease in the right middle lobe and both lower  lobes with somewhat nodular appearance in some areas, particularly  right lower lobe. I favor this is likely infectious/inflammatory.  Cannot exclude pneumonia  PET Scans: None Bronchoscopies: None LDCT Discussion: N/a Cytology & Oncology History *if present  Cytology:  Lab Results  Component Value Date/Time   Final Diagnosis  12/27/2017 11:31 AM    Stomach, antrum, biopsies:        -Mild chronic inactive gastritis.        -Helicobacter pylori immunoperoxidase stain negative.        -Negative for atrophy or specialized metaplasia.    Oncology History   No problem history exists.   Pulmonary Function Data   FEV1  Date Value Ref Range Status  02/11/2023 1.35 liters Final    Comment:    70.6%  04/17/2021 1.32 liters Final    Comment:    67.1%  05/23/2020 1.39 liters Final    Comment:    70%   FVC  Date Value Ref Range Status  02/11/2023 2.21 liters Final    Comment:    87.6%  04/17/2021 2.24 liters Final    Comment:    86.8%  05/23/2020 2.39 liters Final    Comment:    92%   FEV1/FVC  Date Value Ref Range Status  02/11/2023 61 % Final    Comment:    79.5%  04/17/2021 59 % Final    Comment:    76.3%  05/23/2020 58 % Final    Comment:    76%   DLCO  Date Value Ref Range Status  04/14/2018 14.0 ml/mmHg sec Final    Comment:    74%   Please see official PFT interpretation on separate note. [facility] Walk results also reported separately (if done). Immunization History   Immunization History  Administered Date(s) Administered  . Influenza High Dose 04/18/2017  . Influenza Tri 04/23/2017  . Influenza vaccine, quadrivalent, adjuvanted(Fluad) 04/14/2018, 05/23/2020, 05/23/2021,  04/20/2022  . Influenza, high dose seasonal, preservative-free(Fluzone) 04/20/2023, 05/26/2024  . Influenza, high-dose seasonal, quadrivalent, .7mL dose, preservative free(Fluzone) 06/01/2019  . Moderna Covid-19 Vaccine Monovalent 153mcg/0.5ml IM 08/12/2019, 09/09/2019  . PPD Test 01/17/2013, 01/13/2017  . Pneumococcal Conjugate 13 (Prevnar) 02/08/2019  . Pneumococcal Polysaccharide (Pneumovax) 07/20/2002, 05/07/2017  . Seasonal trivalent influenza vaccine, adjuvanted, preservative free 04/11/2013, 05/09/2015  . Tdap 02/04/2012   Review of Systems  Ten point review of systems performed. Pertinent positives mentioned above in HPI.  Review of Systems  Respiratory:  Positive for cough.   All other systems reviewed and are negative.  Physical Exam   Vitals:   06/09/24 1109  BP: (!) 120/28  Pulse: 71  Resp: 18  Temp: 97.5 F (36.4 C)  TempSrc: Oral  SpO2: 96% Comment: O2 on  4lpm cont taken at rest  Weight: 155 lb (70.3 kg)  Height: 5' 3 (1.6 m)       Wt Readings from Last 3 Encounters:  06/09/24 155 lb (70.3 kg)  06/06/24 155 lb (70.3 kg)  05/15/24 162 lb 6.4 oz (73.7 kg)   Body mass index is 27.46 kg/m. Neck Circumference  More data exists      06/09/2024  Neck Circumference  Neck Circumference (inches) 12   Epworth Sleepiness Scale       02/17/2017  EPWORTH SLEEPINESS SCALE  Sitting and reading 0  Watching TV 2  Sitting, inactive in a public place (e.g. a theatre or a meeting) 0  As a passenger in a car for an hour without a break 1  Lying down to rest in the afternoon when circumstances permit 3  Sitting and talking to someone 0  Sitting quietly after a lunch without alcohol  1  In a car, while stopped for a few minutes in traffic 0  Total score 7    Physical Exam Vitals reviewed.  Constitutional:      Appearance: Normal appearance.  HENT:     Nose: Nose normal.     Mouth/Throat:     Mouth: Mucous membranes are moist.     Pharynx: Oropharynx is  clear.  Eyes:     Conjunctiva/sclera: Conjunctivae normal.  Cardiovascular:     Rate and Rhythm: Normal rate and regular rhythm.     Pulses: Normal pulses.     Heart sounds: Normal heart sounds. No murmur heard.    No friction rub. No gallop.  Pulmonary:     Effort: Pulmonary effort is normal. No respiratory distress.     Breath sounds: Normal breath sounds. No stridor. No wheezing, rhonchi or rales.  Neurological:     Mental Status: She is alert.   Past Medical History   Past Medical History:  Diagnosis Date  . Allergy   . Anxiety 03/19/2012   Related to Crohn's  . Arthralgia 03/19/2012  . Atrial fibrillation (*)   . CHF (congestive heart failure) (*)   . Chronic obstructive pulmonary disease (*)   . Crohn's disease (*)   . Crohn's disease of both small and large intestine with intestinal obstruction (*)   . DDD (degenerative disc disease) 03/19/2012   With intermittent paid. Quiescent  . DJD (degenerative joint disease) 03/19/2012   Lower extremity noted while hospitalized at [facility] 02/2011; steroid inj with improvement  . Dry eyes 03/19/2012  . Emphysema of lung (*)   . Emphysema, unspecified (*)   . Essential hypertension 05/15/2024  . GERD (gastroesophageal reflux disease)   . Hemorrhoid   . Hemorrhoids   . Hilar mass 03/19/2012   Questionable on chest x-ray in 09/2003 with f/u chest x-ray ruling out  . Hypertension   . Hypertension   . Hypertriglyceridemia 03/19/2012   Dx 1999:  triglycerides 670. Intolerant of Lopid (constipation). TriCor  since 2001.  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  Followed by Dr. Jethro.  Episode  Of CHF in 06/2010  . Intestinal obstruction (*)   . Migraine headache 03/19/2012   Long term HX.   . Myalgia 03/19/2012  . Nephrolithiasis 03/19/2012   2006. Quiescent  . Neuromuscular disorder (*)   . 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 NSTEMI (non-ST elevated myocardial infarction) (*)  12/12/2019  . On home O2   . Osteopenia 03/19/2012   Dx 06/1999:  femoral neck T-score -1.5 and  spine T-score -0.9.  BMD 11/2001:  femoral neck T-score -1.6 and spine T-score -1.0 Reclast therapy 2010.  . Osteoporosis   . Psoriasis   . Rotator cuff tear, left 03/19/2012   Followed by Dr.  Albertina  . Seasonal affective disorder   . Sleep walking 03/19/2012   With episodic falls  . Stage 3b chronic kidney disease (*) 01/03/2022   Last Assessment & Plan:   Formatting of this note might be different from the original.  Patient tolerated well diuresis with furosemide , at the time of her discharge her renal function had a serum cr of 1.11 with K at 4,0 and serum bicarbonate at 25.     Plan to continue diuresis with furosemide  and as needed metolazone .  Added SGLT2 inhibitor.    . Teeth missing   . Urinary urgency 03/19/2012   unresponsive to conservative therapy; previously eval. by urologist  . 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 Visual changes 03/19/2012  . Vitamin D  deficiency 03/19/2012   Dx 07/1999:  25-OH vitamin D  8.6. Pharmacologic therapy since 10/1999.    Past Surgical History   Past Surgical History:  Procedure Laterality Date  . Abdominal surgery     small bowell adhesions  . Appendectomy    . Breast surgery    . Cholecystectomy    . Colon surgery     partial colectomy  . Colonoscopy  01/22/2011  . [facility]  . Colonoscopy  07/27/2017   Dr. Milon  . Colonoscopy w/ biopsies N/A 07/27/2017   Procedure: COLONOSCOPY W/ BIOPSY;  Surgeon: Lamar JINNY Rosabel, MD;  Location: [facility] ENDO;  Service: Gastroenterology;  Laterality: N/A;  . Cosmetic surgery    . Eye surgery Bilateral    cataracts removed, lens implants  . Hemorrhoid surgery    . Hysterectomy    . Pacemarker     . Small intestine surgery    . Tonsillectomy    . Tubal ligation    . [facility]  . [facility]  [facility]  . Upper gastrointestinal endoscopy  12/27/2017   Dr. Dustin    Family History   Family History  Problem Relation Age of Onset  . Skin cancer Father   . Heart disease Father   . Colon polyps Sister   . Asthma Sister   . Diabetes Sister   . Heart disease Sister   . Heart disease Sister   . Crohn's disease Other   . Colon cancer Neg Hx   . Rectal cancer Neg Hx   . Stomach cancer Neg Hx   . Liver cancer Neg Hx    Social History   Social History   Socioeconomic History  . Marital status: Single  . Number of children: 0  . Years of education: 44  Occupational History  . 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 Smoking status: Former    Current packs/day: 0.00    Average packs/day: 2.0 packs/day for 34.1 years (68.3 ttl pk-yrs)    Types: Cigarettes    Start date: 68    Quit date: 09/12/1998    Years since quitting: 25.7    Passive exposure: Past  . Smokeless tobacco: Never  Vaping Use  . Vaping status: Never Used  Substance and Sexual Activity  . Alcohol  use: No  .  Drug use: No  . Sexual activity: Never    Partners: Male    Birth control/protection: None  Social History Narrative   Never Drank Alcohol    Exercise Habits - No regular exercise.  Very active   Medications/Allergies   Current Home Medications  Medication Sig Last Dose  acetaminophen -codeine  (TYLENOL  #4) 300-60 mg per tablet Take one tablet by mouth every 4 (four) hours as needed for Pain for up to 30 days. Max Daily Amount: 6 tablets   allopurinol  (ZYLOPRIM ) 100 mg tablet Take one half tablet (50 mg dose) by mouth daily.   ALPRAZolam  (XANAX ) 1 mg tablet Take one tablet (1 mg dose) by mouth 2 (two) times a day as needed for Sleep. Max Daily Amount: 2 mg   apixaban  (ELIQUIS ) 5 mg tablet Take one tablet (5 mg dose) by mouth 2 (two) times daily.   atenolol  (TENORMIN ) 25 mg tablet Take one  tablet (25 mg dose) by mouth daily.   colchicine 0.6 mg tablet Take 2 tablets when medication is received.  One hour after taking 1st dose, take 1 more  pill. After day 1,  Take 1 tablet every 12 hours until gout flare up resolves.   colestipol  (COLESTID ) 1 g tablet Take one tablet (1 g dose) by mouth 2 (two) times a day as needed.   cyanocobalamin (VITAMIN B-12) 1000 mcg/mL injection Inject 1 mL (1,000 mcg dose) into the muscle every 30 (thirty) days.   ergocalciferol  (VITAMIN D , ERGOCALCIFEROL ,) 50,000 units CAPS capsule Take one capsule (50,000 Units dose) by mouth once a week.   ezetimibe  (ZETIA ) 10 MG tablet TAKE ONE TABLET BY MOUTH AT BEDTIME   fenofibrate  micronized (LOFIBRA) 200 mg capsule Take one capsule (200 mg dose) by mouth 30 (thirty) minutes before breakfast.   furosemide  (LASIX ) 40 mg tablet Take one tablet (40 mg dose) by mouth daily.   levalbuterol  (XOPENEX  HFA) 45 MCG/ACT inhaler Inhale one puff into the lungs every 4 (four) hours as needed for Wheezing.   levalbuterol  (XOPENEX ) 1.25 MG/3ML nebulizer solution Take 3 mLs (1.25 mg dose) by nebulization every 4 (four) hours as needed for Wheezing.   loratadine  (CLARITIN ) 10 MG tablet Take one tablet (10 mg dose) by mouth daily.   Naloxone HCl 4 MG/0.1ML LIQD nasal spray one spray by Nasal route once as needed for up to 1 dose.   nitroGLYCERIN  (NITROSTAT ) 0.4 mg SL tablet Place one tablet (0.4 mg dose) under the tongue every 5 (five) minutes as needed for Chest pain.   omeprazole (PRILOSEC) 40 mg capsule Take one capsule (40 mg dose) by mouth 30 (thirty) minutes before breakfast.   OXYGEN 3-4 L continuous.   pregabalin  (LYRICA ) 50 mg capsule Take one capsule (50 mg dose) by mouth 2 (two) times daily. Max Daily Amount: 100 mg   RISAQUAD (RISAQUAD) CAPS Take one capsule by mouth 3 (three) times a day.   spironolactone  (ALDACTONE ) 25 mg tablet Take one half tablet (12.5 mg dose) by mouth daily.   TRELEGY ELLIPTA  200-62.5-25 MCG/ACT AEPB  inhaler Inhale 1 puff into the lungs daily.    Allergies  Allergen Reactions  . Epinephrine Hypertension    Increased BP and heart rate  . Azulfidine [Sulfasalazine] Other    headache  . Ceftin [Cefuroxime] Swelling    Tongue swelling  . Flagyl [Metronidazole] Hives  . Lorazepam  Other    Psychosis  . Methylprednisolone  Acetate Pf Hallucinations    Increased BP, HR, agitation  . Morphine And Related Rash  Tolerated Dilaudid  safely before 03/2019   . Other Other    Product Containing Glucocorticoid (product)  . Penicillins Hives    12/2021, 11/2023 tolerated cephalosporins  . Shellfish Allergy Nausea And Vomiting    All SEAFOOD  . Ativan  Confusion/Altered Mental Status  . Motrin [Ibuprofen] Nausea Only   Code Status / Advanced Care Planning  @RRCODESTATUS @ LABS   Lab Results  Component Value Date/Time   Sodium 139 05/15/2024 04:03 PM   Potassium 4.6 05/15/2024 04:03 PM   Chloride 96 05/15/2024 04:03 PM   CO2 25 05/15/2024 04:03 PM   Anion Gap 11 09/12/2014 10:09 PM   Glucose 89 05/15/2024 04:03 PM   BUN 33 (H) 05/15/2024 04:03 PM   Creatinine 1.60 (H) 05/15/2024 04:03 PM   CALCIUM 10.0 05/15/2024 04:03 PM   BUN/Creatinine Ratio 21 05/15/2024 04:03 PM   GFR Non African American 41 09/11/2020 04:30 AM   Lab Results  Component Value Date/Time   Glucose 89 05/15/2024 04:03 PM   BUN 33 (H) 05/15/2024 04:03 PM   Creatinine 1.60 (H) 05/15/2024 04:03 PM   eGFR 32 (L) 05/15/2024 04:03 PM   BUN/Creatinine Ratio 21 05/15/2024 04:03 PM   Sodium 139 05/15/2024 04:03 PM   Potassium 4.6 05/15/2024 04:03 PM   Chloride 96 05/15/2024 04:03 PM   CO2 25 05/15/2024 04:03 PM   Total Protein 7.6 03/14/2024 03:04 PM   Albumin, Serum 4.6 03/14/2024 03:04 PM   Globulin, Total 3.0 03/14/2024 03:04 PM   ALBUMIN/GLOBULIN RATIO 1.3 03/10/2023 10:18 AM   Total Bilirubin 0.5 03/14/2024 03:04 PM   Alkaline Phosphatase 51 03/14/2024 03:04 PM   AST 37 03/14/2024 03:04 PM   ALT (SGPT) 12  03/14/2024 03:04 PM   Lab Results  Component Value Date/Time   WBC 6.7 03/14/2024 03:04 PM   RBC 4.83 03/14/2024 03:04 PM   Hemoglobin 14.3 03/14/2024 03:04 PM   Hematocrit 44.8 03/14/2024 03:04 PM   MCV 93 03/14/2024 03:04 PM   MCH 29.6 03/14/2024 03:04 PM   MCHC 31.9 03/14/2024 03:04 PM   RDW 14.0 03/14/2024 03:04 PM   Platelet Count 231 03/14/2024 03:04 PM   Neutrophils 69 03/14/2024 03:04 PM   Neutrophils Absolute 4.6 03/14/2024 03:04 PM   Lymphocytes Absolute 1.3 03/14/2024 03:04 PM   Lymphs Relative 20 03/14/2024 03:04 PM   Monocytes 8 03/14/2024 03:04 PM   Monocytes Absolute 0.6 03/14/2024 03:04 PM   Eos Relative 2 03/14/2024 03:04 PM   Eosinophils Absolute 0.2 03/14/2024 03:04 PM   Basos Relative  1 03/14/2024 03:04 PM   Basophils Absolute 0.1 03/14/2024 03:04 PM    An after visit summary was provided.  The patient indicates understanding of these issues and agrees with the plan as stated above, after all questions and concerns were addressed.  The patient understands not to drive or operate machinery when drowsy.   I reviewed all prior data in detail, including past medical, family, and social history, medication and allergies sections listed in the above medical record.  Through a shared decision making model, the patient and I have discussed and agreed upon all mentioned data, plans, and treatments.  This note may have been partially (or in whole) dictated with voice recognition software.  Thank you for allowing me to participate in the care of this patient.  Do not hesitate to contact me via Epic or by calling our office at [phone] with any questions or concerns.     Electronically signed by: Morene JINNY Floras, MD 06/09/2024 /  11:56 AM

## 2024-06-11 ENCOUNTER — Other Ambulatory Visit: Payer: Self-pay

## 2024-06-11 ENCOUNTER — Emergency Department (HOSPITAL_COMMUNITY)

## 2024-06-11 ENCOUNTER — Encounter (HOSPITAL_COMMUNITY): Payer: Self-pay | Admitting: Internal Medicine

## 2024-06-11 ENCOUNTER — Inpatient Hospital Stay (HOSPITAL_COMMUNITY)
Admission: EM | Admit: 2024-06-11 | Discharge: 2024-06-13 | DRG: 193 | Disposition: A | Discharge status: Home / Self Care | Attending: Internal Medicine | Admitting: Internal Medicine

## 2024-06-11 DIAGNOSIS — Z7901 Long term (current) use of anticoagulants: Secondary | ICD-10-CM

## 2024-06-11 DIAGNOSIS — Z90710 Acquired absence of both cervix and uterus: Secondary | ICD-10-CM

## 2024-06-11 DIAGNOSIS — Z7951 Long term (current) use of inhaled steroids: Secondary | ICD-10-CM

## 2024-06-11 DIAGNOSIS — I480 Paroxysmal atrial fibrillation: Secondary | ICD-10-CM | POA: Diagnosis present

## 2024-06-11 DIAGNOSIS — Z880 Allergy status to penicillin: Secondary | ICD-10-CM

## 2024-06-11 DIAGNOSIS — J440 Chronic obstructive pulmonary disease with acute lower respiratory infection: Secondary | ICD-10-CM | POA: Diagnosis present

## 2024-06-11 DIAGNOSIS — Z91013 Allergy to seafood: Secondary | ICD-10-CM

## 2024-06-11 DIAGNOSIS — G629 Polyneuropathy, unspecified: Secondary | ICD-10-CM | POA: Diagnosis present

## 2024-06-11 DIAGNOSIS — I495 Sick sinus syndrome: Secondary | ICD-10-CM | POA: Diagnosis present

## 2024-06-11 DIAGNOSIS — N1831 Chronic kidney disease, stage 3a: Secondary | ICD-10-CM | POA: Diagnosis present

## 2024-06-11 DIAGNOSIS — Z888 Allergy status to other drugs, medicaments and biological substances status: Secondary | ICD-10-CM

## 2024-06-11 DIAGNOSIS — J189 Pneumonia, unspecified organism: Principal | ICD-10-CM | POA: Diagnosis present

## 2024-06-11 DIAGNOSIS — Z886 Allergy status to analgesic agent status: Secondary | ICD-10-CM

## 2024-06-11 DIAGNOSIS — I2510 Atherosclerotic heart disease of native coronary artery without angina pectoris: Secondary | ICD-10-CM | POA: Diagnosis present

## 2024-06-11 DIAGNOSIS — Z885 Allergy status to narcotic agent status: Secondary | ICD-10-CM

## 2024-06-11 DIAGNOSIS — Y95 Nosocomial condition: Secondary | ICD-10-CM | POA: Diagnosis present

## 2024-06-11 DIAGNOSIS — G8929 Other chronic pain: Secondary | ICD-10-CM | POA: Diagnosis present

## 2024-06-11 DIAGNOSIS — Z950 Presence of cardiac pacemaker: Secondary | ICD-10-CM

## 2024-06-11 DIAGNOSIS — Z9981 Dependence on supplemental oxygen: Secondary | ICD-10-CM

## 2024-06-11 DIAGNOSIS — G9341 Metabolic encephalopathy: Secondary | ICD-10-CM | POA: Diagnosis present

## 2024-06-11 DIAGNOSIS — Z87891 Personal history of nicotine dependence: Secondary | ICD-10-CM

## 2024-06-11 DIAGNOSIS — J9611 Chronic respiratory failure with hypoxia: Secondary | ICD-10-CM | POA: Diagnosis present

## 2024-06-11 DIAGNOSIS — N390 Urinary tract infection, site not specified: Secondary | ICD-10-CM | POA: Diagnosis present

## 2024-06-11 DIAGNOSIS — Z602 Problems related to living alone: Secondary | ICD-10-CM | POA: Diagnosis present

## 2024-06-11 DIAGNOSIS — Z79899 Other long term (current) drug therapy: Secondary | ICD-10-CM

## 2024-06-11 DIAGNOSIS — I5032 Chronic diastolic (congestive) heart failure: Secondary | ICD-10-CM | POA: Diagnosis present

## 2024-06-11 DIAGNOSIS — Z1152 Encounter for screening for COVID-19: Secondary | ICD-10-CM

## 2024-06-11 LAB — COMPREHENSIVE METABOLIC PANEL WITH GFR
ALT: 12 U/L (ref 0–44)
AST: 47 U/L — ABNORMAL HIGH (ref 15–41)
Albumin: 4.6 g/dL (ref 3.5–5.0)
Alkaline Phosphatase: 50 U/L (ref 38–126)
Anion gap: 13 (ref 5–15)
BUN: 24 mg/dL — ABNORMAL HIGH (ref 8–23)
CO2: 27 mmol/L (ref 22–32)
Calcium: 9.9 mg/dL (ref 8.9–10.3)
Chloride: 99 mmol/L (ref 98–111)
Creatinine, Ser: 1.31 mg/dL — ABNORMAL HIGH (ref 0.44–1.00)
GFR, Estimated: 40 mL/min — ABNORMAL LOW (ref >60)
Glucose, Bld: 109 mg/dL — ABNORMAL HIGH (ref 70–99)
Potassium: 4.2 mmol/L (ref 3.5–5.1)
Sodium: 139 mmol/L (ref 135–145)
Total Bilirubin: 1 mg/dL (ref 0.0–1.2)
Total Protein: 8.4 g/dL — ABNORMAL HIGH (ref 6.5–8.1)

## 2024-06-11 LAB — CBC WITH DIFFERENTIAL/PLATELET
Abs Immature Granulocytes: 0.07 K/uL (ref 0.00–0.07)
Basophils Absolute: 0.1 K/uL (ref 0.0–0.1)
Basophils Relative: 1 %
Eosinophils Absolute: 0.4 K/uL (ref 0.0–0.5)
Eosinophils Relative: 3 %
HCT: 42.2 % (ref 36.0–46.0)
Hemoglobin: 13.2 g/dL (ref 12.0–15.0)
Immature Granulocytes: 1 %
Lymphocytes Relative: 4 %
Lymphs Abs: 0.6 K/uL — ABNORMAL LOW (ref 0.7–4.0)
MCH: 28.4 pg (ref 26.0–34.0)
MCHC: 31.3 g/dL (ref 30.0–36.0)
MCV: 90.8 fL (ref 80.0–100.0)
Monocytes Absolute: 0.8 K/uL (ref 0.1–1.0)
Monocytes Relative: 5 %
Neutro Abs: 13 K/uL — ABNORMAL HIGH (ref 1.7–7.7)
Neutrophils Relative %: 86 %
Platelets: 298 K/uL (ref 150–400)
RBC: 4.65 MIL/uL (ref 3.87–5.11)
RDW: 14.9 % (ref 11.5–15.5)
WBC: 15 K/uL — ABNORMAL HIGH (ref 4.0–10.5)
nRBC: 0 % (ref 0.0–0.2)

## 2024-06-11 LAB — URINALYSIS, W/ REFLEX TO CULTURE (INFECTION SUSPECTED)
Bacteria, UA: NONE SEEN
Bilirubin Urine: NEGATIVE
Glucose, UA: NEGATIVE mg/dL
Hgb urine dipstick: NEGATIVE
Ketones, ur: NEGATIVE mg/dL
Nitrite: NEGATIVE
Protein, ur: NEGATIVE mg/dL
Specific Gravity, Urine: 1.006 (ref 1.005–1.030)
pH: 5 (ref 5.0–8.0)

## 2024-06-11 LAB — BLOOD GAS, VENOUS
Acid-Base Excess: 5.1 mmol/L — ABNORMAL HIGH (ref 0.0–2.0)
Bicarbonate: 30.5 mmol/L — ABNORMAL HIGH (ref 20.0–28.0)
O2 Saturation: 32 %
Patient temperature: 37
pCO2, Ven: 47 mmHg (ref 44–60)
pH, Ven: 7.42 (ref 7.25–7.43)
pO2, Ven: <31 mmHg — CL (ref 32–45)

## 2024-06-11 LAB — RESP PANEL BY RT-PCR (RSV, FLU A&B, COVID)  RVPGX2
Influenza A by PCR: NEGATIVE
Influenza B by PCR: NEGATIVE
Resp Syncytial Virus by PCR: NEGATIVE
SARS Coronavirus 2 by RT PCR: NEGATIVE

## 2024-06-11 LAB — TROPONIN T, HIGH SENSITIVITY
Troponin T High Sensitivity: 81 ng/L — ABNORMAL HIGH (ref 0–19)
Troponin T High Sensitivity: 78 ng/L — ABNORMAL HIGH (ref 0–19)

## 2024-06-11 LAB — I-STAT CG4 LACTIC ACID, ED
Lactic Acid, Venous: 1.7 mmol/L (ref 0.5–1.9)
Lactic Acid, Venous: 1.1 mmol/L (ref 0.5–1.9)

## 2024-06-11 MED ORDER — ORAL CARE MOUTH RINSE: 15.0000 mL | OROMUCOSAL | Status: DC | PRN

## 2024-06-11 MED ORDER — VANCOMYCIN HCL 750 MG/150ML IV SOLN
750.0000 mg | INTRAVENOUS | Status: DC
  Administered 2024-06-12: 750 mg via INTRAVENOUS
  Filled 2024-06-11: qty 150

## 2024-06-11 MED ORDER — FENOFIBRATE 160 MG PO TABS
160.0000 mg | ORAL_TABLET | Freq: Every day | ORAL | Status: DC
  Administered 2024-06-12 – 2024-06-13 (×2): 160 mg via ORAL
  Filled 2024-06-11 (×2): qty 1

## 2024-06-11 MED ORDER — ALPRAZOLAM 0.5 MG PO TABS
1.0000 mg | ORAL_TABLET | Freq: Two times a day (BID) | ORAL | Status: DC
  Administered 2024-06-12 (×2): 1 mg via ORAL
  Filled 2024-06-11 (×3): qty 2

## 2024-06-11 MED ORDER — OXYCODONE HCL 5 MG PO TABS
5.0000 mg | ORAL_TABLET | ORAL | Status: DC | PRN
  Administered 2024-06-12 (×3): 5 mg via ORAL
  Filled 2024-06-11 (×3): qty 1

## 2024-06-11 MED ORDER — ATENOLOL 25 MG PO TABS
25.0000 mg | ORAL_TABLET | Freq: Every day | ORAL | Status: DC
  Administered 2024-06-12 – 2024-06-13 (×2): 25 mg via ORAL
  Filled 2024-06-11 (×2): qty 1

## 2024-06-11 MED ORDER — ALLOPURINOL 100 MG PO TABS
50.0000 mg | ORAL_TABLET | Freq: Every day | ORAL | Status: DC
  Administered 2024-06-12 – 2024-06-13 (×2): 50 mg via ORAL
  Filled 2024-06-11 (×2): qty 1

## 2024-06-11 MED ORDER — VANCOMYCIN HCL 1500 MG/300ML IV SOLN
1500.0000 mg | Freq: Once | INTRAVENOUS | Status: AC
  Administered 2024-06-11: 1500 mg via INTRAVENOUS
  Filled 2024-06-11: qty 300

## 2024-06-11 MED ORDER — ONDANSETRON HCL 4 MG PO TABS
4.0000 mg | ORAL_TABLET | Freq: Four times a day (QID) | ORAL | Status: DC | PRN
  Administered 2024-06-13: 4 mg via ORAL
  Filled 2024-06-11: qty 1

## 2024-06-11 MED ORDER — ACETAMINOPHEN 325 MG PO TABS
650.0000 mg | ORAL_TABLET | Freq: Once | ORAL | Status: AC
  Administered 2024-06-11: 650 mg via ORAL
  Filled 2024-06-11: qty 2

## 2024-06-11 MED ORDER — VANCOMYCIN HCL IN DEXTROSE 1-5 GM/200ML-% IV SOLN: 1000.0000 mg | Freq: Once | INTRAVENOUS | Status: DC

## 2024-06-11 MED ORDER — BUDESON-GLYCOPYRROL-FORMOTEROL 160-9-4.8 MCG/ACT IN AERO
2.0000 | INHALATION_SPRAY | Freq: Two times a day (BID) | RESPIRATORY_TRACT | Status: DC
  Administered 2024-06-12 – 2024-06-13 (×3): 2 via RESPIRATORY_TRACT
  Filled 2024-06-11: qty 5.9

## 2024-06-11 MED ORDER — APIXABAN 5 MG PO TABS
5.0000 mg | ORAL_TABLET | Freq: Two times a day (BID) | ORAL | Status: DC
  Administered 2024-06-12 – 2024-06-13 (×3): 5 mg via ORAL
  Filled 2024-06-11 (×3): qty 1

## 2024-06-11 MED ORDER — ACETAMINOPHEN 325 MG PO TABS
650.0000 mg | ORAL_TABLET | Freq: Four times a day (QID) | ORAL | Status: DC | PRN
  Administered 2024-06-12: 650 mg via ORAL
  Filled 2024-06-11: qty 2

## 2024-06-11 MED ORDER — PREGABALIN 50 MG PO CAPS: 50.0000 mg | ORAL_CAPSULE | Freq: Two times a day (BID) | ORAL | Status: DC

## 2024-06-11 MED ORDER — PANTOPRAZOLE SODIUM 40 MG PO TBEC
40.0000 mg | DELAYED_RELEASE_TABLET | Freq: Every day | ORAL | Status: DC
  Administered 2024-06-12 – 2024-06-13 (×2): 40 mg via ORAL
  Filled 2024-06-11 (×2): qty 1

## 2024-06-11 MED ORDER — COLESTIPOL HCL 1 G PO TABS: 1.0000 g | ORAL_TABLET | Freq: Two times a day (BID) | ORAL | Status: DC | PRN

## 2024-06-11 MED ORDER — EZETIMIBE 10 MG PO TABS
10.0000 mg | ORAL_TABLET | Freq: Every day | ORAL | Status: DC
  Administered 2024-06-12: 10 mg via ORAL
  Filled 2024-06-11: qty 1

## 2024-06-11 MED ORDER — ACETAMINOPHEN 650 MG RE SUPP: 650.0000 mg | Freq: Four times a day (QID) | RECTAL | Status: DC | PRN

## 2024-06-11 MED ORDER — SPIRONOLACTONE 12.5 MG HALF TABLET
12.5000 mg | ORAL_TABLET | Freq: Every day | ORAL | Status: DC
  Administered 2024-06-12 – 2024-06-13 (×2): 12.5 mg via ORAL
  Filled 2024-06-11 (×2): qty 1

## 2024-06-11 MED ORDER — ONDANSETRON HCL 4 MG/2ML IJ SOLN: 4.0000 mg | Freq: Four times a day (QID) | INTRAMUSCULAR | Status: DC | PRN

## 2024-06-11 MED ORDER — SODIUM CHLORIDE 0.9 % IV SOLN
2.0000 g | Freq: Two times a day (BID) | INTRAVENOUS | Status: DC
  Administered 2024-06-12 – 2024-06-13 (×3): 2 g via INTRAVENOUS
  Filled 2024-06-11 (×3): qty 12.5

## 2024-06-11 MED ORDER — FUROSEMIDE 40 MG PO TABS
40.0000 mg | ORAL_TABLET | Freq: Every day | ORAL | Status: DC
  Administered 2024-06-12 – 2024-06-13 (×2): 40 mg via ORAL
  Filled 2024-06-11 (×2): qty 1

## 2024-06-11 MED ORDER — SODIUM CHLORIDE 0.9 % IV SOLN
2.0000 g | Freq: Once | INTRAVENOUS | Status: AC
  Administered 2024-06-11: 2 g via INTRAVENOUS
  Filled 2024-06-11: qty 12.5

## 2024-06-11 NOTE — Progress Notes (Signed)
 Pharmacy Antibiotic Note  Brittany Irwin is a 83 y.o. female admitted on 06/11/2024 with altered mental status and pneumonia.  Recent hospitalization for elevated troponin and treatment of UTI.  Pharmacy has been consulted for Vancomycin  and Cefepime  dosing.  Plan: Vancomycin  750 mg IV Q 24 hrs. Goal AUC 400-550.  Expected AUC: 500.1  SCr used: 1.31 Cefepime  2g IV q12h Follow renal function F/u culture results & sensitivities     Temp (24hrs), Avg:100.2 F (37.9 C), Min:99.7 F (37.6 C), Max:100.6 F (38.1 C)  Recent Labs  Lab 06/11/24 1828 06/11/24 1830 06/11/24 2003  WBC 15.0*  --   --   CREATININE 1.31*  --   --   LATICACIDVEN  --  1.7 1.1    Estimated Creatinine Clearance: 32.6 mL/min (A) (by C-G formula based on SCr of 1.31 mg/dL (H)).    Allergies  Allergen Reactions   Methylprednisolone  Other (See Comments)    Increased BP, HR, agitation, hallucinations    Shellfish Allergy Nausea And Vomiting    All SEAFOOD   Ativan  [Lorazepam ] Other (See Comments)    Psychosis   Azulfidine [Sulfasalazine] Other (See Comments)    Headache    Epipen [Epinephrine] Hypertension   Flagyl [Metronidazole] Hives   Motrin [Ibuprofen] Nausea Only and Other (See Comments)    Told not to take medication due to GI distress caused by crohn's disease.   Nsaids Other (See Comments)    Told not to take NSAIDs due to GI distress caused by crohn's disease   Penicillins Hives    12/2021, 11/2023 tolerated cephalosporins    Morphine And Codeine  Itching    Told not to take due to GI distress caused by crohn's disease    Antimicrobials this admission: 11/23 Vancomycin  >>   11/23 Cefepime  >>    Dose adjustments this admission:    Microbiology results: 11/23 BCx:     Thank you for allowing pharmacy to be a part of this patient's care.  Arvin Gauss, PharmD 06/11/2024 9:58 PM

## 2024-06-11 NOTE — ED Triage Notes (Signed)
 Pt bib by ems due to change in mental status. Dx with UTI in last couple of days and started antibiotics. Unsure if antibiotics were taken according to family. Pt is now a/o x1 Hx copd baseline 3l [place]

## 2024-06-11 NOTE — Progress Notes (Signed)
 ED Pharmacy Antibiotic Sign Off An antibiotic consult was received from an ED provider for cefepime  & vancomycin  per pharmacy dosing for PNA. A chart review was completed to assess appropriateness.   The following one time order(s) were placed:  Cefepime  2 gm & vancomycin  1500 mg  Further antibiotic and/or antibiotic pharmacy consults should be ordered by the admitting provider if indicated.   Thank you for allowing pharmacy to be a part of this patient's care.   Rosaline IVAR Edison, Pharm.D Use secure chat for questions 06/11/2024 7:31 PM Clinical Pharmacist 06/11/24 7:31 PM

## 2024-06-11 NOTE — H&P (Signed)
 History and Physical    Brittany Irwin FMW:[id number] DOB: [birth year] DOA: 06/11/2024  PCP: [name], MD   Chief Complaint: lorre  HPI: Brittany Irwin is a 83 y.o. female with medical history significant of A-fib on Eliquis  who presented with altered mental status.  Patient was recently admitted for elevated troponin and elected for conservative management.  Was eventually discharged to her facility after being treated with antibiotics for UTI.  Patient was transferred from nursing facility to the ER due to confusion.  On arrival she was afebrile and hemodynamically stable.  Labs were obtained which showed urinalysis negative for infection, WBC 15.0, hemoglobin 13.2, creatinine 1.3, troponin 81, respiratory viral panel was negative for infection, lactic acid 1.3.  Patient underwent chest x-ray which showed density in right lung with CT findings of pneumonia.  CT head showed no acute findings.  Patient was started on vancomycin  and cefepime  for hospital-acquired pneumonia admitted for further workup.  On admission she was altered and unable to provide much history.   Review of Systems: ROS   As per HPI otherwise 10 point review of systems negative.   Allergies  Allergen Reactions   Methylprednisolone  Other (See Comments)    Increased BP, HR, agitation, hallucinations    Shellfish Allergy Nausea And Vomiting    All SEAFOOD   Ativan  [Lorazepam ] Other (See Comments)    Psychosis   Azulfidine [Sulfasalazine] Other (See Comments)    Headache    Epipen [Epinephrine] Hypertension   Flagyl [Metronidazole] Hives   Motrin [Ibuprofen] Nausea Only and Other (See Comments)    Told not to take medication due to GI distress caused by crohn's disease.   Nsaids Other (See Comments)    Told not to take NSAIDs due to GI distress caused by crohn's disease   Penicillins Hives    12/2021, 11/2023 tolerated cephalosporins    Morphine And Codeine  Itching    Told not to take due to GI distress  caused by crohn's disease    Past Medical History:  Diagnosis Date   Acute CHF (congestive heart failure) (HCC) 01/02/2022   AKI (acute kidney injury) 09/08/2020   Aspiration pneumonia (HCC) 01/05/2019   Atypical chest pain 03/11/2022   Bradycardia 03/14/2020   C. difficile colitis 04/03/2021   CHF (congestive heart failure) (HCC)    COPD (chronic obstructive pulmonary disease) (HCC)    Coronary artery disease    Tachycardia 05/31/2013    Past Surgical History:  Procedure Laterality Date   ABDOMINAL HYSTERECTOMY     BOWEL RESECTION     CHOLECYSTECTOMY       reports that she has quit smoking. Her smoking use included cigarettes. She does not have any smokeless tobacco history on file. She reports that she does not currently use alcohol . She reports that she does not use drugs.  No family history on file.  Prior to Admission medications   Medication Sig Start Date End Date Taking? Authorizing Provider  acetaminophen -codeine  (TYLENOL  #4) 300-60 MG tablet Take 1 tablet by mouth 4 (four) times daily as needed (for back pain for Crohn's). 05/30/24   [provider]  allopurinol  (ZYLOPRIM ) 100 MG tablet Take 50 mg by mouth daily. 05/02/24   [provider]  ALPRAZolam  (XANAX ) 1 MG tablet Take 1 mg by mouth 2 (two) times daily. 01/02/22   [provider]  apixaban  (ELIQUIS ) 5 MG TABS tablet Take 5 mg by mouth 2 (two) times daily.    [provider]  atenolol  (TENORMIN ) 25  MG tablet Take 1 tablet (25 mg total) by mouth daily. 02/16/24   Sherrill Cable Latif, DO  colestipol  (COLESTID ) 1 g tablet Take 1 tablet (1 g total) by mouth 2 (two) times daily as needed (diarrhea). 04/03/24   Amin, Ankit C, MD  cyclobenzaprine  (FLEXERIL ) 5 MG tablet Take 5 mg by mouth daily as needed for muscle spasms. 10/13/22   [provider]  diclofenac  Sodium (VOLTAREN  ARTHRITIS PAIN) 1 % GEL Apply 1 Application topically in the morning and at bedtime.    [provider]  ezetimibe  (ZETIA ) 10 MG tablet Take 10 mg by mouth at bedtime. 12/31/21   [provider]  fenofibrate  micronized (LOFIBRA) 200 MG capsule Take 200 mg by mouth daily. 12/31/21   [provider]  Fluticasone -Umeclidin-Vilant (TRELEGY ELLIPTA ) 200-62.5-25 MCG/ACT AEPB Inhale 1 puff into the lungs daily. 12/22/23     furosemide  (LASIX ) 40 MG tablet Take 40 mg by mouth daily.    [provider]  levalbuterol  (XOPENEX  HFA) 45 MCG/ACT inhaler Inhale 2 puffs into the lungs 2 (two) times daily as needed for shortness of breath or wheezing. 05/26/24   [provider]  nitroGLYCERIN  (NITROSTAT ) 0.4 MG SL tablet Place 0.4 mg under the tongue every 5 (five) minutes as needed for chest pain. 05/09/24   [provider]  omeprazole (PRILOSEC) 40 MG capsule Take 40 mg by mouth daily.    [provider]  pregabalin  (LYRICA ) 50 MG capsule Take 50 mg by mouth 2 (two) times daily.    [provider]  spironolactone  (ALDACTONE ) 25 MG tablet Take 0.5 tablets (12.5 mg total) by mouth daily. 03/05/24 05/31/25  Dennise Lavada POUR, MD  STARCH-MALTO DEXTRIN [facility]) POWD Make liquids nectar thick. Kindly dispense any thickening product to make liquids nectar thick consistency, 1 month supply no refills. 03/05/24   Dennise Lavada POUR, MD    Physical Exam: Vitals:   06/11/24 1721 06/11/24 1848  BP: (!) 150/59 (!) 120/57  Pulse: 77 78  Resp: 20 19  Temp: (!) 100.6 F (38.1 C) 99.7 F (37.6 C)  TempSrc: Oral Oral  SpO2: 93% 91%   Physical Exam ***    Labs on Admission: I have personally reviewed the patients's labs and imaging studies.  Assessment/Plan Principal Problem:   Hospital-acquired pneumonia   # Acute infectious encephalopathy most likely secondary to hospital-acquired pneumonia - Patient presented confused with chest x-ray concerning for infiltrate - Started on vancomycin  and cefepime   Plan: Continue vancomycin  and  cefepime   # Elevated troponin-patient was recently admitted with chest pain and was found to have a flat troponin.  Cardiology was consulted at that time an elective supportive care  # Paroxysmal A-fib-continue atenolol , Eliquis   # Chronic diastolic heart failure, not in exacerbation-continue home beta-blocker, spironolactone  and Bumex   # Chronic peripheral neuropathy-patient's had issues with chronic leg pain.  Will continue home Voltaren  gel, Robaxin , Mirapex , Lyrica   # Tachybradycardia syndrome-status post pacemaker-continue to monitor  # Chronic respiratory failure-history of COPD on 4 L of oxygen.  Continue home inhalers  # CKD stage IIIa-creatinine at baseline ***   Admission status: Inpatient Med-Surg  Certification: The appropriate patient status for this patient is INPATIENT. Inpatient status is judged to be reasonable and necessary in order to provide the required intensity of service to ensure the patient's safety. The patient's presenting symptoms, physical exam findings, and initial radiographic and laboratory data in the context of their chronic comorbidities is felt to place them at high risk for  further clinical deterioration. Furthermore, it is not anticipated that the patient will be medically stable for discharge from the hospital within 2 midnights of admission.   * I certify that at the point of admission it is my clinical judgment that the patient will require inpatient hospital care spanning beyond 2 midnights from the point of admission due to high intensity of service, high risk for further deterioration and high frequency of surveillance required.DEWAINE Lamar Dess MD [facility] If 7PM-7AM, please contact night-coverage [email or web address]  06/11/2024, 9:11 PM

## 2024-06-11 NOTE — ED Provider Notes (Signed)
 Brittany Irwin is a 83 y.o. female history of A-fib on Eliquis , here presenting with altered mental status.  Patient was recently admitted for elevated troponin and did not want a catheterization at that time.  Patient is currently from a facility.  Patient was diagnosed with UTI and is put on antibiotics.  Patient cannot tell me what antibiotic she is on and there is no transfer paperwork from the facility.  Patient has been coming progressively more confused.  Patient admits to some cough and low-grade temperature.  Denies any chest pain.   The history is provided by the patient.       Prior to Admission medications   Medication Sig Start Date End Date Taking? Authorizing Provider  acetaminophen -codeine  (TYLENOL  #4) 300-60 MG tablet Take 1 tablet by mouth 4 (four) times daily as needed (for back pain for Crohn's). 05/30/24   [provider]  allopurinol  (ZYLOPRIM ) 100 MG tablet Take 50 mg by mouth daily. 05/02/24   [provider]  ALPRAZolam  (XANAX ) 1 MG tablet Take 1 mg by mouth 2 (two) times daily. 01/02/22   [provider]  apixaban  (ELIQUIS ) 5 MG TABS tablet Take 5 mg by mouth 2 (two) times daily.    [provider]  atenolol  (TENORMIN ) 25 MG tablet Take 1 tablet (25 mg total) by mouth daily. 02/16/24   Sherrill Cable Latif, DO  colestipol  (COLESTID ) 1 g tablet Take 1 tablet (1 g total) by mouth 2 (two) times daily as needed (diarrhea). 04/03/24   Amin, Ankit C, MD  cyclobenzaprine  (FLEXERIL ) 5 MG tablet Take 5 mg by mouth daily as needed for muscle spasms. 10/13/22   [provider]  diclofenac  Sodium (VOLTAREN  ARTHRITIS PAIN) 1 % GEL Apply 1 Application topically in the morning and at bedtime.    [provider]  ezetimibe  (ZETIA ) 10 MG tablet Take 10  mg by mouth at bedtime. 12/31/21   [provider]  fenofibrate  micronized (LOFIBRA) 200 MG capsule Take 200 mg by mouth daily. 12/31/21   [provider]  Fluticasone -Umeclidin-Vilant (TRELEGY ELLIPTA ) 200-62.5-25 MCG/ACT AEPB Inhale 1 puff into the lungs daily. 12/22/23     furosemide  (LASIX ) 40 MG tablet Take 40 mg by mouth daily.    [provider]  levalbuterol  (XOPENEX  HFA) 45 MCG/ACT inhaler Inhale 2 puffs into the lungs 2 (two) times daily as needed for shortness of breath or wheezing. 05/26/24   [provider]  nitroGLYCERIN  (NITROSTAT ) 0.4 MG SL tablet Place 0.4 mg under the tongue every 5 (five) minutes as needed for chest pain. 05/09/24   [provider]  omeprazole (PRILOSEC) 40 MG capsule Take 40 mg by mouth daily.    [provider]  pregabalin  (LYRICA ) 50 MG capsule Take 50 mg by mouth 2 (two) times daily.    [provider]  spironolactone  (ALDACTONE ) 25 MG tablet Take 0.5 tablets (12.5 mg total) by mouth daily. 03/05/24 05/31/25  Dennise Lavada POUR, MD  ST[facility]-MAL[facility] DEXTRIN [facility]) POWD Make liquids nectar thick. Kindly dispense any thickening product to make liquids nectar thick consistency, 1 month supply no refills. 03/05/24   Singh, Prashant K, MD    Allergies: Methylprednisolone , Shellfish allergy, Ativan  [lorazepam ], Azulfidine [sulfasalazine], Epipen [epinephrine], Flagyl [metronidazole], Motrin [ibuprofen], Nsaids, Penicillins, and Morphine and codeine     Review  of Systems  Respiratory:  Positive for cough and shortness of breath.   All other systems reviewed and are negative.   Updated Vital Signs BP (!) 120/57 (BP Location: Right Arm)   Pulse 78   Temp 99.7 F (37.6 C) (Oral)   Resp 19   SpO2 91%   Physical Exam Vitals and nursing note reviewed.  Constitutional:      Comments: Confused and ANO x 1  HENT:     Head: Normocephalic.     Nose: Nose normal.     Mouth/Throat:     Mouth: Mucous membranes  are dry.  Eyes:     Extraocular Movements: Extraocular movements intact.     Pupils: Pupils are equal, round, and reactive to light.  Cardiovascular:     Rate and Rhythm: Normal rate and regular rhythm.     Pulses: Normal pulses.     Heart sounds: Normal heart sounds.  Pulmonary:     Comments: Diminished bilaterally  Abdominal:     General: Abdomen is flat.     Palpations: Abdomen is soft.  Musculoskeletal:        General: Normal range of motion.     Cervical back: Normal range of motion and neck supple.  Skin:    General: Skin is warm.     Capillary Refill: Capillary refill takes less than 2 seconds.  Neurological:     Comments: ANO x 1.  Patient is moving all extremities  Psychiatric:        Mood and Affect: Mood normal.     (all labs ordered are listed, but only abnormal results are displayed) Labs Reviewed  CBC WITH DIFFERENTIAL/PLATELET - Abnormal; Notable for the following components:      Result Value   WBC 15.0 (*)    Neutro Abs 13.0 (*)    Lymphs Abs 0.6 (*)    All other components within normal limits  URINALYSIS, W/ REFLEX TO CULTURE (INFECTION SUSPECTED) - Abnormal; Notable for the following components:   Color, Urine STRAW (*)    Leukocytes,Ua TRACE (*)    All other components within normal limits  BLOOD GAS, VENOUS - Abnormal; Notable for the following components:   pO2, Ven <31 (*)    Bicarbonate 30.5 (*)    Acid-Base Excess 5.1 (*)    All other components within normal limits  RESP PANEL BY RT-PCR (RSV, FLU A&B, COVID)  RVPGX2  COMPREHENSIVE METABOLIC PANEL WITH GFR  I-STAT CG4 LACTIC ACID, ED  TROPONIN T, HIGH SENSITIVITY  TROPONIN T, HIGH SENSITIVITY    EKG: EKG Interpretation Date/Time:  Sunday June 11 2024 17:38:26 EST Ventricular Rate:  74 PR Interval:  205 QRS Duration:  166 QT Interval:  437 QTC Calculation: 485 R Axis:   238  Text Interpretation: Sinus or ectopic atrial rhythm Right bundle branch block Inferior infarct, acute  Lateral leads are also involved No significant change since last tracing Confirmed by Patt Alm DEL [phone]) on 06/11/2024 5:46:35 PM  Radiology: No results found.   Procedures   Medications Ordered in the ED  acetaminophen  (TYLENOL ) tablet 650 mg (650 mg Oral Given 06/11/24 1851)                                    Medical Decision Making JOANE POSTEL is a 83 y.o. female here presenting with confusion and fever.  Concern for worsening UTI versus pneumonia versus viral.  Plan to get CBC and CMP and UA and CT head and chest x-ray.   8:12 PM I reviewed patient's labs and independently interpreted chest x-ray.  White blood cell count is 15.  Patient has a pneumonia of the right base with posterior layering effusion.  Given Vanco and cefepime .  Hospitalist to admit for pneumonia.  Problems Addressed: HCAP (healthcare-associated pneumonia): acute illness or injury  Amount and/or Complexity of Data Reviewed Labs: ordered. Decision-making details documented in ED Course. Radiology: ordered and independent interpretation performed. Decision-making details documented in ED Course. ECG/medicine tests: ordered and independent interpretation performed. Decision-making details documented in ED Course.  Risk OTC drugs. Prescription drug management.     Final diagnoses:  None    ED Discharge Orders     None          Patt Alm Macho, MD 06/11/24 2015

## 2024-06-12 ENCOUNTER — Other Ambulatory Visit (HOSPITAL_COMMUNITY): Payer: Self-pay

## 2024-06-12 DIAGNOSIS — Y95 Nosocomial condition: Secondary | ICD-10-CM | POA: Diagnosis not present

## 2024-06-12 DIAGNOSIS — J189 Pneumonia, unspecified organism: Secondary | ICD-10-CM | POA: Diagnosis not present

## 2024-06-12 LAB — CBC
HCT: 35.1 % — ABNORMAL LOW (ref 36.0–46.0)
Hemoglobin: 11 g/dL — ABNORMAL LOW (ref 12.0–15.0)
MCH: 28.5 pg (ref 26.0–34.0)
MCHC: 31.3 g/dL (ref 30.0–36.0)
MCV: 90.9 fL (ref 80.0–100.0)
Platelets: 225 K/uL (ref 150–400)
RBC: 3.86 MIL/uL — ABNORMAL LOW (ref 3.87–5.11)
RDW: 14.9 % (ref 11.5–15.5)
WBC: 9.8 K/uL (ref 4.0–10.5)
nRBC: 0 % (ref 0.0–0.2)

## 2024-06-12 LAB — BASIC METABOLIC PANEL WITH GFR
Anion gap: 10 (ref 5–15)
BUN: 25 mg/dL — ABNORMAL HIGH (ref 8–23)
CO2: 27 mmol/L (ref 22–32)
Calcium: 8.8 mg/dL — ABNORMAL LOW (ref 8.9–10.3)
Chloride: 103 mmol/L (ref 98–111)
Creatinine, Ser: 1.24 mg/dL — ABNORMAL HIGH (ref 0.44–1.00)
GFR, Estimated: 43 mL/min — ABNORMAL LOW (ref >60)
Glucose, Bld: 107 mg/dL — ABNORMAL HIGH (ref 70–99)
Potassium: 3.8 mmol/L (ref 3.5–5.1)
Sodium: 141 mmol/L (ref 135–145)

## 2024-06-12 LAB — PROTIME-INR
INR: 1.6 — ABNORMAL HIGH (ref 0.8–1.2)
Prothrombin Time: 20.3 s — ABNORMAL HIGH (ref 11.4–15.2)

## 2024-06-12 MED ORDER — PREGABALIN 50 MG PO CAPS
50.0000 mg | ORAL_CAPSULE | Freq: Two times a day (BID) | ORAL | Status: DC
  Administered 2024-06-12 – 2024-06-13 (×3): 50 mg via ORAL
  Filled 2024-06-12 (×3): qty 1

## 2024-06-12 MED ORDER — SODIUM CHLORIDE 0.9% FLUSH: 10.0000 mL | INTRAVENOUS | Status: DC | PRN

## 2024-06-12 MED ORDER — CYCLOBENZAPRINE HCL 5 MG PO TABS
5.0000 mg | ORAL_TABLET | Freq: Once | ORAL | Status: AC
  Administered 2024-06-12: 5 mg via ORAL
  Filled 2024-06-12: qty 1

## 2024-06-12 MED ORDER — METHOCARBAMOL 500 MG PO TABS
500.0000 mg | ORAL_TABLET | Freq: Once | ORAL | Status: AC
  Administered 2024-06-13: 500 mg via ORAL
  Filled 2024-06-12 (×2): qty 1

## 2024-06-12 MED ORDER — OXYCODONE HCL 5 MG PO TABS
10.0000 mg | ORAL_TABLET | ORAL | Status: DC | PRN
  Administered 2024-06-12 – 2024-06-13 (×3): 10 mg via ORAL
  Filled 2024-06-12 (×3): qty 2

## 2024-06-12 MED ORDER — DICLOFENAC SODIUM 1 % EX GEL
2.0000 g | Freq: Four times a day (QID) | CUTANEOUS | Status: DC
  Administered 2024-06-12 – 2024-06-13 (×3): 2 g via TOPICAL
  Filled 2024-06-12: qty 100

## 2024-06-12 NOTE — Plan of Care (Signed)

## 2024-06-12 NOTE — Evaluation (Signed)
 Physical Therapy Evaluation Patient Details Name: Brittany Irwin MRN: [id number] DOB: [birth year] Today's Date: 06/12/2024  History of Present Illness  83 yo female presents to therapy following hospital admission on 06/11/2024 due to AMS. CT revealing R middle lobe PNA. Pt dx with CAP and metabolic encephalopathy. Pt has been hospitalized 8 times this year most recently in September x 2 and in August for recurrent PNA.  Pt PMH Includes but is not limited to: pacemaker in situ, CKD IIIa, chronic hypoxemia on 4 L/min at baseline, dCHF, NSTEMI, anxiety, HTN, COPD, Crohns, anemia, chronic LBP, anxiety, SBO, HA, psoriasis, and peripheral neuropathy.  Clinical Impression    Pt admitted with above diagnosis.  Pt currently with functional limitations due to the deficits listed below (see PT Problem List). Pt in bed when PT arrived. Pt stated that she had just completed OT eval. Pt also reported that she had recently completed course of HH PT and was not really wanting to do [facility] again. Pt is mod I for supine to sit, S for transfer tasks to RW, gait S with RW and min cues 300 feet no overt LOB no reported SOB and pt on 4 L/min-- baseline. Pt left seated in recliner, all needs in place and PT made nurse aware of pt LBP and request for pain meds/hot pack. Pt will benefit from acute skilled PT to increase their independence and safety with mobility to allow discharge.         If plan is discharge home, recommend the following: A little help with walking and/or transfers;Assistance with cooking/housework;Assist for transportation   Can travel by private vehicle        Equipment Recommendations None recommended by PT  Recommendations for Other Services       Functional Status Assessment Patient has had a recent decline in their functional status and demonstrates the ability to make significant improvements in function in a reasonable and predictable amount of time.     Precautions / Restrictions  Precautions Precautions: Fall Restrictions Weight Bearing Restrictions Per Provider Order: No Other Position/Activity Restrictions: 4L O2 at baseline      Mobility  Bed Mobility Overal bed mobility: Modified Independent             General bed mobility comments: increased time and effort but no assist needed    Transfers Overall transfer level: Needs assistance Equipment used: Rolling walker (2 wheels) Transfers: Sit to/from Stand Sit to Stand: Supervision           General transfer comment: supervision for safety with line management    Ambulation/Gait Ambulation/Gait assistance: Supervision Gait Distance (Feet): 300 Feet Assistive device: Rolling walker (2 wheels) Gait Pattern/deviations: Step-through pattern, Trunk flexed Gait velocity: decreased     General Gait Details: slight trunk flexion attributed to reports of LBP with mobiltiy and when at rest min cues for safety and RW mangement  Stairs            Wheelchair Mobility     Tilt Bed    Modified Rankin (Stroke Patients Only)       Balance Overall balance assessment: Mild deficits observed, not formally tested                                           Pertinent Vitals/Pain Pain Assessment Pain Assessment: 0-10 Pain Score: 4  Breathing: normal Negative Vocalization: none Facial Expression:  smiling or inexpressive Body Language: relaxed Consolability: no need to console PAINAD Score: 0 Pain Location: back, BLEs, reports history of peripheral neuropathy Pain Descriptors / Indicators: Aching, Discomfort Pain Intervention(s): Limited activity within patient's tolerance, Monitored during session, Repositioned, Patient requesting pain meds-RN notified    Home Living Family/patient expects to be discharged to:: Other (Comment) (ILF) Living Arrangements: Alone Available Help at Discharge: Family;Available PRN/intermittently Type of Home: Apartment Home Access: Level  entry       Home Layout: One level Home Equipment: Agricultural Consultant (2 wheels);Rollator (4 wheels);Grab bars - tub/shower;Hand held shower head;Other (comment) Additional Comments: Pt at [facility], Sister lives close by who can assist occasionally, has Life Alert    Prior Function Prior Level of Function : Independent/Modified Independent             Mobility Comments: 4L O2 baseline, needs rollator at times when feeling weak ADLs Comments: sister provides transportation, ind with ADLs, cooks     Extremity/Trunk Assessment   Upper Extremity Assessment Upper Extremity Assessment: Defer to OT evaluation    Lower Extremity Assessment Lower Extremity Assessment: Overall WFL for tasks assessed    Cervical / Trunk Assessment Cervical / Trunk Assessment: Normal (hx of chronic back pain)  Communication   Communication Communication: No apparent difficulties    Cognition Arousal: Alert Behavior During Therapy: WFL for tasks assessed/performed   PT - Cognitive impairments: No apparent impairments                         Following commands: Intact       Cueing Cueing Techniques: Verbal cues     General Comments General comments (skin integrity, edema, etc.): O2 WFL no reports of SOB and on 4 L/min throughout eval    Exercises     Assessment/Plan    PT Assessment Patient needs continued PT services  PT Problem List Decreased activity tolerance;Decreased balance;Pain       PT Treatment Interventions DME instruction;Gait training;Functional mobility training;Therapeutic activities;Therapeutic exercise;Balance training;Neuromuscular re-education;Patient/family education    PT Goals (Current goals can be found in the Care Plan section)  Acute Rehab PT Goals Patient Stated Goal: to go home and get better PT Goal Formulation: With patient Time For Goal Achievement: 06/27/24 Potential to Achieve Goals: Good    Frequency Min 2X/week      Co-evaluation               AM-PAC PT 6 Clicks Mobility  Outcome Measure Help needed turning from your back to your side while in a flat bed without using bedrails?: None Help needed moving from lying on your back to sitting on the side of a flat bed without using bedrails?: None Help needed moving to and from a bed to a chair (including a wheelchair)?: None Help needed standing up from a chair using your arms (e.g., wheelchair or bedside chair)?: A Little Help needed to walk in hospital room?: A Little Help needed climbing 3-5 steps with a railing? : Total 6 Click Score: 19    End of Session Equipment Utilized During Treatment: Gait belt Activity Tolerance: Patient limited by pain Patient left: in chair;with call bell/phone within reach Nurse Communication: Mobility status;Patient requests pain meds PT Visit Diagnosis: Unsteadiness on feet (R26.81);Difficulty in walking, not elsewhere classified (R26.2);Pain Pain - part of body:  (back)    Time: 8557-8487 PT Time Calculation (min) (ACUTE ONLY): 30 min   Charges:   PT Evaluation $PT Eval  Low Complexity: 1 Low PT Treatments $Gait Training: 8-22 mins PT General Charges $$ ACUTE PT VISIT: 1 Visit         [place], PT Acute Rehab   [place] VEAR Drone 06/12/2024, 6:22 PM

## 2024-06-12 NOTE — Evaluation (Signed)
 Occupational Therapy Evaluation Patient Details Name: Brittany Irwin MRN: [id number] DOB: 11-Nov-1940 Today's Date: 06/12/2024   History of Present Illness   83 year old with Afib on Eliquis  brought from independent facility, presents with confusion,  admitted with encephalopathy, right middle lobe pneumonia. PMH of HFpEF, CAD, A-fib on Eliquis , tachy-brady syndrome status post PPM, COPD on 4 L home oxygen, hypertension, hyperlipidemia, anxiety, Crohn's disease, SBO, psoriasis, migraine, CKD stage IIIa, spinal stenosis/chronic back pain, neuropathy, GERD     Clinical Impressions Pt c/o back pain and intermittent BLE pain. Pt live at ILF, PLOF independent. Pt currently close to baseline, able to ambulate around room as needed with help for IV cart and O2 tubing. Pt O2 saturation WNLs throughout session. Pt stands at sink able to complete grooming/hygiene as needed. Able to complete toileting without physical assist. Pt with no apparent cognitive deficits, A/Ox4, conversational, pleasant. Pt at this time has no further acute OT needs, no OT follow up needed.      If plan is discharge home, recommend the following:   Assist for transportation     Functional Status Assessment   Patient has had a recent decline in their functional status and demonstrates the ability to make significant improvements in function in a reasonable and predictable amount of time.     Equipment Recommendations   None recommended by OT     Recommendations for Other Services         Precautions/Restrictions   Precautions Precautions: Fall Recall of Precautions/Restrictions: Intact Restrictions Weight Bearing Restrictions Per Provider Order: No     Mobility Bed Mobility Overal bed mobility: Modified Independent                  Transfers Overall transfer level: Needs assistance Equipment used: Rolling walker (2 wheels) Transfers: Sit to/from Stand Sit to Stand: Supervision            General transfer comment: supervision for safety with line management      Balance Overall balance assessment: Mild deficits observed, not formally tested                                         ADL either performed or assessed with clinical judgement   ADL Overall ADL's : At baseline;Needs assistance/impaired                                       General ADL Comments: Pt ambualtes with supervision for safety, needs help dueto line management, O2 and IV     Vision Baseline Vision/History: 1 Wears glasses Ability to See in Adequate Light: 0 Adequate Patient Visual Report: No change from baseline       Perception         Praxis         Pertinent Vitals/Pain Pain Assessment Pain Assessment: 0-10 Pain Score: 2  Pain Location: BLEs, reports history of peripheral neuropathy Pain Descriptors / Indicators: Aching, Discomfort Pain Intervention(s): Monitored during session     Extremity/Trunk Assessment Upper Extremity Assessment Upper Extremity Assessment: Overall WFL for tasks assessed           Communication Communication Communication: No apparent difficulties   Cognition Arousal: Alert Behavior During Therapy: WFL for tasks assessed/performed Cognition: No family/caregiver present to determine baseline  OT - Cognition Comments: A/Ox4, no apparent confusion during session, able to recall all events from home leading up to hospital, recalls last admission, PLOF, feeling better.                 Following commands: Intact       Cueing  General Comments   Cueing Techniques: Verbal cues  O2 WNLs during session on 4L O2 via [place]   Exercises     Shoulder Instructions      Home Living Family/patient expects to be discharged to:: Other (Comment) (ILF) Living Arrangements: Alone Available Help at Discharge: Family;Available PRN/intermittently Type of Home: Apartment Home Access: Level entry      Home Layout: One level     Bathroom Shower/Tub: Chief Strategy Officer: Standard Bathroom Accessibility: Yes How Accessible: Accessible via walker Home Equipment: Rolling Walker (2 wheels);Rollator (4 wheels);Grab bars - tub/shower;Hand held shower head;Other (comment)   Additional Comments: Pt at [facility] ILF, Sister lives close by who can assist occasionally, has Life Alert      Prior Functioning/Environment Prior Level of Function : Independent/Modified Independent             Mobility Comments: 4L O2 baseline, needs rollator at times when feeling weak ADLs Comments: sister provides transportation, ind with ADLs, cooks    OT Problem List: Decreased strength;Decreased activity tolerance;Pain   OT Treatment/Interventions:        OT Goals(Current goals can be found in the care plan section)   Acute Rehab OT Goals Patient Stated Goal: to return home OT Goal Formulation: With patient Time For Goal Achievement: 06/26/24 Potential to Achieve Goals: Good   OT Frequency:       Co-evaluation              AM-PAC OT 6 Clicks Daily Activity     Outcome Measure Help from another person eating meals?: None Help from another person taking care of personal grooming?: None Help from another person toileting, which includes using toliet, bedpan, or urinal?: None Help from another person bathing (including washing, rinsing, drying)?: None Help from another person to put on and taking off regular upper body clothing?: None Help from another person to put on and taking off regular lower body clothing?: None 6 Click Score: 24   End of Session Equipment Utilized During Treatment: Gait belt;Rolling walker (2 wheels) Nurse Communication: Mobility status  Activity Tolerance: Patient tolerated treatment well Patient left: in bed;with call bell/phone within reach  OT Visit Diagnosis: Muscle weakness (generalized) (M62.81);Pain                Time:  8743-8661 OT Time Calculation (min): 42 min Charges:  OT General Charges $OT Visit: 1 Visit OT Evaluation $OT Eval Low Complexity: 1 Low OT Treatments $Self Care/Home Management : 23-37 mins  [address] OTR/L   Elouise JONELLE Bott 06/12/2024, 1:57 PM

## 2024-06-12 NOTE — Progress Notes (Signed)
 PROGRESS NOTE    Brittany Irwin  FMW:[id number] DOB: 07/25/1940 DOA: 06/11/2024 PCP: [name], MD    Brief Narrative:  83 year old with Afib on Eliquis  brought from independent facility, she called her neighbor confused over found patient had a low-grade temperature and was noted to be confused to brought to the ER.  In the emergency room hemodynamically stable.  Did have some evidence of confusion.  He CT head was normal.  CT chest finding with mostly atelectatic changes right middle lobe.  Admitted with encephalopathy, right middle lobe pneumonia.  Subjective: Patient seen and examined.  Denies any complaints.  Poor historian, however today she is able to recall her being tired.  She does realize that she was confused but woke up with clear mind in the hospital.  No other overnight events.  Patient had significant psychosis with Ativan  during last hospitalization in September 2025. Patient tells me that she feels like her legs are like pretzels from neuropathy.  Denies any extra dose of medications taken.    Assessment & Plan:   Right middle lobe pneumonia: Flu COVID and RSV negative.  Blood cultures negative so far. Atelectasis versus pneumonia but patient presented with encephalopathy and low-grade fever. Will continue vancomycin  and cefepime  today due to recent hospitalizations. Chest physiotherapy, incentive spirometry, deep breathing exercises, sputum induction, mucolytic's and bronchodilators. Sputum cultures, blood cultures.  Supplemental oxygen to keep saturations more than 90%. Mobilize with PT OT.  Acute metabolic encephalopathy, likely infective metabolic encephalopathy due to above.  Rule out polypharmacy. Mental status improving.  Patient on Robaxin , Mirapex  and Lyrica , will start today and monitor.  High risk of polypharmacy.  On alprazolam  at home.  Will discuss controlled use of medications.  Tachybradycardia syndrome: Status post pacemaker.  Chronic  hypoxemia: On 4 L oxygen at home.  Remains on 4 L oxygen.  Uses inhalers.  CKD stage IIIa: At about baseline.  Paroxysmal A-fib: Therapeutic on Eliquis .  Continue atenolol .  Chronic diastolic heart failure: Not on exacerbation.  Continue atenolol , spironolactone  and Bumex .  Chronic peripheral neuropathy: Avoid polypharmacy.  Continue Lyrica , Xanax  under supervision.    DVT prophylaxis: SCDs Start: 06/11/24 2105 apixaban  (ELIQUIS ) tablet 5 mg   Code Status: Full code Family Communication: Sister called and updated Disposition Plan: Status is: Inpatient Remains inpatient appropriate because: IV antibiotics     Consultants:  None  Procedures:  None  Antimicrobials:  Vancomycin  and cefepime  11/23---     Objective: Vitals:   06/11/24 2225 06/12/24 0300 06/12/24 0633 06/12/24 1042  BP: (!) 103/51 135/67 102/61 (!) 96/51  Pulse: 61 (!) 59 (!) 58 68  Resp: 16 16 16    Temp: 98.1 F (36.7 C)  98.3 F (36.8 C)   TempSrc:      SpO2: 96% 99% 97% 98%    Intake/Output Summary (Last 24 hours) at 06/12/2024 1331 Last data filed at 06/12/2024 1046 Gross per 24 hour  Intake 240 ml  Output --  Net 240 ml   There were no vitals filed for this visit.  Examination:  General exam: Appears calm and comfortable.  Pale debility.  Chronically sick looking.  Not in any distress. Respiratory system: Clear to auscultation. Respiratory effort normal.  Some conducted upper airway sounds.  No added sounds. Cardiovascular system: S1 & S2 heard, RRR. No JVD, murmurs, rubs, gallops or clicks. No pedal edema. Gastrointestinal system: Abdomen is nondistended, soft and nontender. No organomegaly or masses felt. Normal bowel sounds heard. Central nervous system: Alert  and awake.  Mostly oriented.  Moves all extremities equally.    Data Reviewed: I have personally reviewed following labs and imaging studies  CBC: Recent Labs  Lab 06/11/24 1828 06/12/24 0320  WBC 15.0* 9.8  NEUTROABS  13.0*  --   HGB 13.2 11.0*  HCT 42.2 35.1*  MCV 90.8 90.9  PLT 298 225   Basic Metabolic Panel: Recent Labs  Lab 06/11/24 1828 06/12/24 0320  NA 139 141  K 4.2 3.8  CL 99 103  CO2 27 27  GLUCOSE 109* 107*  BUN 24* 25*  CREATININE 1.31* 1.24*  CALCIUM 9.9 8.8*   GFR: Estimated Creatinine Clearance: 34.5 mL/min (A) (by C-G formula based on SCr of 1.24 mg/dL (H)). Liver Function Tests: Recent Labs  Lab 06/11/24 1828  AST 47*  ALT 12  ALKPHOS 50  BILITOT 1.0  PROT 8.4*  ALBUMIN 4.6   No results for input(s): LIPASE, AMYLASE in the last 168 hours. No results for input(s): AMMONIA in the last 168 hours. Coagulation Profile: Recent Labs  Lab 06/12/24 0320  INR 1.6*   Cardiac Enzymes: No results for input(s): CKTOTAL, CKMB, CKMBINDEX, TROPONINI in the last 168 hours. BNP (last 3 results) No results for input(s): PROBNP in the last 8760 hours. HbA1C: No results for input(s): HGBA1C in the last 72 hours. CBG: No results for input(s): GLUCAP in the last 168 hours. Lipid Profile: No results for input(s): CHOL, HDL, LDLCALC, TRIG, CHOLHDL, LDLDIRECT in the last 72 hours. Thyroid  Function Tests: No results for input(s): TSH, T4TOTAL, FREET4, T3FREE, THYROIDAB in the last 72 hours. Anemia Panel: No results for input(s): VITAMINB12, FOLATE, FERRITIN, TIBC, IRON, RETICCTPCT in the last 72 hours. Sepsis Labs: Recent Labs  Lab 06/11/24 1830 06/11/24 2003  LATICACIDVEN 1.7 1.1    Recent Results (from the past 240 hours)  Resp panel by RT-PCR (RSV, Flu A&B, Covid) Anterior Nasal Swab     Status: None   Collection Time: 06/11/24  6:28 PM   Specimen: Anterior Nasal Swab  Result Value Ref Range Status   SARS Coronavirus 2 by RT PCR NEGATIVE NEGATIVE Final    Comment: (NOTE) SARS-CoV-2 target nucleic acids are NOT DETECTED.  The SARS-CoV-2 RNA is generally detectable in upper respiratory specimens during the acute  phase of infection. The lowest concentration of SARS-CoV-2 viral copies this assay can detect is 138 copies/mL. A negative result does not preclude SARS-Cov-2 infection and should not be used as the sole basis for treatment or other patient management decisions. A negative result may occur with  improper specimen collection/handling, submission of specimen other than nasopharyngeal swab, presence of viral mutation(s) within the areas targeted by this assay, and inadequate number of viral copies(<138 copies/mL). A negative result must be combined with clinical observations, patient history, and epidemiological information. The expected result is Negative.  Fact Sheet for Patients:  bloggercourse.com  Fact Sheet for Healthcare Providers:  seriousbroker.it  This test is no t yet approved or cleared by the [place]  FDA and  has been authorized for detection and/or diagnosis of SARS-CoV-2 by FDA under an Emergency Use Authorization (EUA). This EUA will remain  in effect (meaning this test can be used) for the duration of the COVID-19 declaration under Section 564(b)(1) of the Act, 21 U.S.C.section 360bbb-3(b)(1), unless the authorization is terminated  or revoked sooner.       Influenza A by PCR NEGATIVE NEGATIVE Final   Influenza B by PCR NEGATIVE NEGATIVE Final    Comment: (NOTE) The  Xpert Xpress SARS-CoV-2/FLU/RSV plus assay is intended as an aid in the diagnosis of influenza from Nasopharyngeal swab specimens and should not be used as a sole basis for treatment. Nasal washings and aspirates are unacceptable for Xpert Xpress SARS-CoV-2/FLU/RSV testing.  Fact Sheet for Patients: bloggercourse.com  Fact Sheet for Healthcare Providers: seriousbroker.it  This test is not yet approved or cleared by the [place]  FDA and has been authorized for detection and/or diagnosis of  SARS-CoV-2 by FDA under an Emergency Use Authorization (EUA). This EUA will remain in effect (meaning this test can be used) for the duration of the COVID-19 declaration under Section 564(b)(1) of the Act, 21 U.S.C. section 360bbb-3(b)(1), unless the authorization is terminated or revoked.     Resp Syncytial Virus by PCR NEGATIVE NEGATIVE Final    Comment: (NOTE) Fact Sheet for Patients: bloggercourse.com  Fact Sheet for Healthcare Providers: seriousbroker.it  This test is not yet approved or cleared by the [place]  FDA and has been authorized for detection and/or diagnosis of SARS-CoV-2 by FDA under an Emergency Use Authorization (EUA). This EUA will remain in effect (meaning this test can be used) for the duration of the COVID-19 declaration under Section 564(b)(1) of the Act, 21 U.S.C. section 360bbb-3(b)(1), unless the authorization is terminated or revoked.  Performed at [facility], [address], [place], [place] [address]   Blood culture (routine x 2)     Status: None (Preliminary result)   Collection Time: 06/11/24  7:54 PM   Specimen: BLOOD  Result Value Ref Range Status   Specimen Description   Final    BLOOD LEFT ANTECUBITAL Performed at [facility], [address] [place], [place] [address]    Special Requests   Final    BOTTLES DRAWN AEROBIC AND ANAEROBIC Blood Culture adequate volume Performed at [facility], [address], [place], [place] [address]    Culture   Final    NO GROWTH < 12 HOURS Performed at [facility], [address] [place], [place] [address]    Report Status PENDING  Incomplete  Blood culture (routine x 2)     Status: None (Preliminary result)   Collection Time: 06/11/24  8:55 PM   Specimen: BLOOD  Result Value Ref Range Status   Specimen Description   Final    BLOOD RIGHT ANTECUBITAL Performed at [facility], [address] [place], [place] [address]    Special Requests   Final    BOTTLES DRAWN AEROBIC AND ANAEROBIC Blood Culture adequate volume Performed at [facility], [address], [place], [place] [address]    Culture   Final    NO GROWTH < 12 HOURS Performed at [facility], [address], [place], [place] [address]    Report Status PENDING  Incomplete         Radiology Studies: CT Chest Wo Contrast Result Date: 06/11/2024 EXAM: CT CHEST WITHOUT CONTRAST 06/11/2024 08:41:25 PM TECHNIQUE: CT of the chest was performed without the administration of intravenous contrast. The exam is somewhat limited due to the lack of IV contrast. Multiplanar reformatted images are provided for review. Automated exposure control, iterative reconstruction, and/or weight based adjustment of the mA/kV was utilized to reduce the radiation dose to as low as reasonably achievable. COMPARISON: Plain film from earlier in the same day, CT from 03/31/2024. CLINICAL HISTORY: Persistent cough for 2 months despite treatment. FINDINGS: MEDIASTINUM: The heart is mildly enlarged  in size. Pacing device is noted and stable. No pericardial abnormality is seen. Atherosclerotic calcifications of the aorta are noted. No aneurysmal dilatation is seen. The central airways are clear. The esophagus is unremarkable. Thoracic inlet is within normal limits. No hilar or mediastinal abnormality seen. LYMPH NODES: No mediastinal, hilar or axillary lymphadenopathy. LUNGS AND PLEURA: The lungs are well aerated bilaterally. Emphysematous changes are noted throughout, similar to that seen on prior exams. There is again noted chronic atelectasis in the right middle lobe. Scattered small less than 5 mm subpleural nodules are noted, stable from prior exams but not significant by size criteria. No sizable nodule is seen. Previously seen right basilar infiltrative changes have resolved in the interval. No  new focal infiltrate is seen. No pulmonary edema. No pleural effusion or pneumothorax. SOFT TISSUES/BONES: No acute bony abnormality is seen. No acute abnormality of the soft tissues. UPPER ABDOMEN: Limited images of the upper abdomen shows renal cysts on the left. No follow-up is recommended. IMPRESSION: 1. No acute cardiopulmonary process . 2. Chronic atelectasis in the right middle lobe. 3. Scattered small subpleural nodules less than 5 mm, stable from prior exams and below size thresholds for routine follow-up per Fleischner Society guidelines. Electronically signed by: Oneil Devonshire MD 06/11/2024 08:56 PM EST RP Workstation: [id number]   CT HEAD WO CONTRAST ( ) Result Date: 06/11/2024 EXAM: CT HEAD WITHOUT CONTRAST 06/11/2024 08:41:25 PM TECHNIQUE: CT of the head was performed without the administration of intravenous contrast. Automated exposure control, iterative reconstruction, and/or weight based adjustment of the mA/kV was utilized to reduce the radiation dose to as low as reasonably achievable. COMPARISON: 03/31/2024 CLINICAL HISTORY: Mental status change, unknown cause FINDINGS: BRAIN AND VENTRICLES: No acute hemorrhage. No evidence of acute infarct. No hydrocephalus. No extra-axial collection. No mass effect or midline shift. ORBITS: No acute abnormality. SINUSES: No acute abnormality. SOFT TISSUES AND SKULL: No acute soft tissue abnormality. No skull fracture. IMPRESSION: 1. No acute intracranial abnormality. Electronically signed by: Franky Crease MD 06/11/2024 08:45 PM EST RP Workstation: [id number]   DG Chest Port 1 View Result Date: 06/11/2024 EXAM: 1 VIEW(S) XRAY OF THE CHEST 06/11/2024 07:10:00 PM COMPARISON: 05/30/2024. CLINICAL HISTORY: AMS FINDINGS: LUNGS AND PLEURA: Slight increased density is noted in the right base, which may represent a posteriorly layering effusion. The lungs are well aerated bilaterally. No other focal abnormality is seen. No pneumothorax. HEART AND MEDIASTINUM:  The cardiac shadow is prominent. The pacemaker is again seen and stable. BONES AND SOFT TISSUES: No acute osseous abnormality. IMPRESSION: 1. Slight increased density in the right lung base, possibly representing a posteriorly layering effusion. Electronically signed by: Oneil Devonshire MD 06/11/2024 07:14 PM EST RP Workstation: [id number]        Scheduled Meds:  allopurinol   50 mg Oral Daily   ALPRAZolam   1 mg Oral BID   apixaban   5 mg Oral BID   atenolol   25 mg Oral Daily   budesonide -glycopyrrolate -formoterol   2 puff Inhalation BID   ezetimibe   10 mg Oral QHS   fenofibrate   160 mg Oral Daily   furosemide   40 mg Oral Daily   pantoprazole   40 mg Oral Daily   pregabalin   50 mg Oral BID   spironolactone   12.5 mg Oral Daily   Continuous Infusions:  ceFEPime  (MAXIPIME ) IV 2 g (06/12/24 0852)   vancomycin        LOS: 1 day       Renato Applebaum, MD [facility]

## 2024-06-12 NOTE — Plan of Care (Signed)
  Problem: Clinical Measurements: Goal: Respiratory complications will improve Outcome: Progressing Goal: Cardiovascular complication will be avoided Outcome: Progressing   Problem: Coping: Goal: Level of anxiety will decrease Outcome: Progressing   Problem: Pain Managment: Goal: General experience of comfort will improve and/or be controlled Outcome: Progressing   Problem: Safety: Goal: Ability to remain free from injury will improve Outcome: Progressing

## 2024-06-12 NOTE — Progress Notes (Signed)
 [facility]  PROCEDURAL EXPEDITER PROGRESS NOTE  Patient Name: Brittany Irwin  DOB:03/29/1941 Date of Admission: 06/11/2024  Date of Assessment:06/12/24   ------------------------------------------------------------------------------------------------------------------- Pt may need transportation to be d/c tomorrow  -------------------------------------------------------------------------------------------------------------------  [facility], Ronal DELENA Bald Please contact us  directly via secure chat (search for [facility]) or by calling us  at [phone] [facility]).

## 2024-06-13 DIAGNOSIS — Y95 Nosocomial condition: Secondary | ICD-10-CM | POA: Diagnosis not present

## 2024-06-13 DIAGNOSIS — J189 Pneumonia, unspecified organism: Secondary | ICD-10-CM | POA: Diagnosis not present

## 2024-06-13 MED ORDER — DOXYCYCLINE HYCLATE 100 MG PO TABS: 100.0000 mg | ORAL_TABLET | Freq: Two times a day (BID) | ORAL | 0 refills | Status: AC

## 2024-06-13 MED ORDER — OXYCODONE HCL 5 MG PO TABS: 5.0000 mg | ORAL_TABLET | ORAL | 0 refills | Status: AC | PRN

## 2024-06-13 NOTE — TOC Initial Note (Signed)
 Transition of Care [facility]) - Initial/Assessment Note    Patient Details  Name: Brittany Irwin MRN: [id number] Date of Birth: [birth year]  [facility] CM/SW Contact:    Doneta Glenys DASEN, RN Phone Number: 06/13/2024, 10:22 AM  Clinical Narrative:                 Presented with SOB. PTA lives in an apartment alone;verified PCP/insurance;oxygen 4 liter with [employer] and will have sister bring travel tank at discharge;denies HH, and SDOH needs;[name] (sister) [phone] will transport at discharge.  No additional needs identified. Place consult if needs present.  Expected Discharge Plan: Home/Self Care Barriers to Discharge: No Barriers Identified   Patient Goals and CMS Choice Patient states their goals for this hospitalization and ongoing recovery are:: Home CMS Medicare.gov Compare Post Acute Care list provided to::  (NA) Choice offered to / list presented to : NA [place] ownership interest in [facility].provided to:: Parent NA    Expected Discharge Plan and Services In-house Referral: NA Discharge Planning Services: CM Consult Post Acute Care Choice: NA Living arrangements for the past 2 months: Apartment Expected Discharge Date: 06/13/24               DME Arranged: N/A DME Agency: NA       HH Arranged: NA HH Agency: NA        Prior Living Arrangements/Services Living arrangements for the past 2 months: Apartment Lives with:: Self Patient language and need for interpreter reviewed:: Yes Do you feel safe going back to the place where you live?: Yes      Need for Family Participation in Patient Care: No (Comment) Care giver support system in place?: Yes (comment) Current home services: DME (walker, oxygen w/ [employer] at 4L) Criminal Activity/Legal Involvement Pertinent to Current Situation/Hospitalization: No - Comment as needed  Activities of Daily Living   ADL Screening (condition at time of admission) Independently performs ADLs?: Yes  (appropriate for developmental age) Is the patient deaf or have difficulty hearing?: No Does the patient have difficulty seeing, even when wearing glasses/contacts?: No Does the patient have difficulty concentrating, remembering, or making decisions?: Yes  Permission Sought/Granted Permission sought to share information with : Case Manager Permission granted to share information with : Yes, Verbal Permission Granted  Share Information with NAME: Ladene [name] (Sister)  [phone]  Permission granted to share info w AGENCY: [employer]        Emotional Assessment Appearance:: Appears stated age Attitude/Demeanor/Rapport: Gracious Affect (typically observed): Appropriate Orientation: : Oriented to Self, Oriented to Place, Oriented to  Time, Oriented to Situation Alcohol  / Substance Use: Not Applicable Psych Involvement: No (comment)  Admission diagnosis:  Hospital-acquired pneumonia [J18.9, Y95] HCAP (healthcare-associated pneumonia) [J18.9] Patient Active Problem List   Diagnosis Date Noted   Hospital-acquired pneumonia 06/11/2024   Long term (current) use of anticoagulants 06/01/2024   Atherosclerosis of aorta 05/31/2024   Calcification of native coronary artery 05/31/2024   Pacemaker 05/31/2024   Benign hypertension 05/31/2024   Chest pain 05/30/2024   Atrial fibrillation (HCC) 05/30/2024   Sepsis (HCC) 03/31/2024   Urinary tract infection 03/31/2024   Dysphagia 03/31/2024   Hypomagnesemia 03/31/2024   Acute on chronic respiratory failure with hypoxia (HCC) 03/02/2024   Multifocal pneumonia 03/02/2024   Elevated troponin 02/28/2024   Acute kidney injury superimposed on chronic kidney disease 02/14/2024   Acute metabolic encephalopathy 02/14/2024   Abnormal urinalysis 02/14/2024   (HFpEF) heart failure with preserved ejection fraction (HCC) 02/14/2024  Neuropathy 02/14/2024   Hyperlipidemia 02/14/2024   GERD (gastroesophageal reflux disease) 02/14/2024   Sepsis due to  pneumonia (HCC) 09/30/2023   COVID 09/06/2023   COVID-19 virus infection 09/05/2023   Sepsis without acute organ dysfunction (HCC) 09/05/2023   Acute on chronic hypoxic respiratory failure (HCC) 09/05/2023   Dehydration 09/05/2023   Hypotension due to hypovolemia 09/05/2023   SBO (small bowel obstruction) (HCC) 08/11/2023   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   Presence of permanent cardiac pacemaker 08/11/2023   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) 10/15/2022   Chest pain, rule out acute myocardial infarction 10/14/2022   Chronic respiratory failure with hypoxia (HCC) 03/12/2022   Essential hypertension 01/03/2022   Chronic kidney disease, stage 3a (HCC) 01/03/2022   Chronic anemia 01/03/2022   Crohn's disease (HCC) 01/03/2022   Chronic back pain 01/03/2022   AKI (acute kidney injury) 09/08/2020   Chronic diastolic (congestive) heart failure (HCC) 10/04/2013   COPD (chronic obstructive pulmonary disease) (HCC) 04/08/2012   Anxiety 03/19/2012   PCP:  [name], MD Pharmacy:   [facility] [place], [place] - [address] [place] [address] Phone: [phone] Fax: [phone]  [facility] [address] [place] [address] Phone: [phone] Fax: [phone]     Social Drivers of Health (SDOH) Social History: SDOH Screenings   Food Insecurity: No Food Insecurity (06/11/2024)  Housing: Low Risk  (06/11/2024)  Transportation Needs: No Transportation Needs (06/11/2024)  Utilities: Not At Risk (06/11/2024)  Financial Resource Strain: Low Risk  (10/14/2023)   Received from [facility]  Recent Concern: Financial Resource Strain - Medium Risk (07/27/2023)   Received from [facility]  Physical Activity: Insufficiently Active (07/27/2023)   Received from [facility]  Social Connections: Moderately Isolated (06/11/2024)  Stress: No Stress Concern  Present (07/27/2023)   Received from [facility]  Tobacco Use: Medium Risk (06/11/2024)   SDOH Interventions:     Readmission Risk Interventions    06/13/2024   10:20 AM 04/03/2024   10:40 AM 10/02/2023    2:35 PM  Readmission Risk Prevention Plan  Transportation Screening Complete Complete Complete  HRI or Home Care Consult   Complete  Social Work Consult for Recovery Care Planning/Counseling   Complete  Palliative Care Screening   Not Applicable  Medication Review [employer]) Complete Referral to Pharmacy Complete  PCP or Specialist appointment within 3-5 days of discharge Complete Complete   HRI or Home Care Consult Complete Complete   SW Recovery Care/Counseling Consult Complete Complete   Palliative Care Screening Not Applicable Not Applicable   Skilled Nursing Facility Not Applicable Not Applicable

## 2024-06-13 NOTE — Discharge Summary (Signed)
 Physician Discharge Summary  Brittany Irwin FMW:[id number] DOB: 07/31/1940 DOA: 06/11/2024  PCP: [name], MD  Admit date: 06/11/2024 Discharge date: 06/13/2024  Admitted From: Home Disposition: Home  Recommendations for Outpatient Follow-up:  Follow up with PCP in 1-2 weeks Please obtain BMP/CBC in one week   Home Health: Declined Equipment/Devices: Oxygen available at home  Discharge Condition: Stable CODE STATUS: Full code Diet recommendation: Low-salt diet  Discharge summary: 83 year old with Afib on Eliquis  brought from independent facility, she called her neighbor confused. She was found to have low-grade temperature and was noted to be confused so brought to the ER.  In the emergency room hemodynamically stable.  Did have some evidence of confusion.  He CT head was normal.  CT chest finding with mostly atelectatic changes right middle lobe.  Admitted with encephalopathy, suspected right middle lobe pneumonia.  Symptomatically improved and going home now.  Treated for following conditions.  Right middle lobe pneumonia versus atelectasis.  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 Flu COVID and RSV negative.  Blood cultures negative so far. Atelectasis versus pneumonia but patient presented with encephalopathy and low-grade fever so treated with broad-spectrum antibiotics with vancomycin  and cefepime  for last 2 days. Clinically improved. Cultures negative. Doxycycline  for additional 5 days. Recommended to continue to deep breathing exercises and incentive spirometer at home.   Acute metabolic encephalopathy, likely infective metabolic encephalopathy due to above.  Rule out polypharmacy. Mental status improving.  Patient on Robaxin , Mirapex  and Lyrica , continue with careful monitoring.  High risk of polypharmacy.  On alprazolam  at home.   On tylenol  #4 at home PRN.  Patient with significant pain, will prescribe a short course of oxycodone .   Tachybradycardia  syndrome: Status post pacemaker.  Stable.   Chronic hypoxemia: On 4 L oxygen at home.  Remains on 4 L oxygen.  Uses inhalers.   CKD stage IIIa: At about baseline.   Paroxysmal A-fib: Therapeutic on Eliquis .  Continue atenolol .   Chronic diastolic heart failure: Not on exacerbation.  Continue atenolol , spironolactone  and Bumex .   Chronic peripheral neuropathy: Avoid polypharmacy.  Continue Lyrica , Xanax  under supervision.   Patient has adequately improved and able to go home today.  She declined home health physical therapy.  Discharge Diagnoses:  Principal Problem:   Hospital-acquired pneumonia    Discharge Instructions  Discharge Instructions     Diet - low sodium heart healthy   Complete by: As directed    Increase activity slowly   Complete by: As directed       Allergies as of 06/13/2024       Reactions   Methylprednisolone  Other (See Comments)   Increased BP, HR, agitation, hallucinations    Shellfish Allergy Nausea And Vomiting   All SEAFOOD   Ativan  [lorazepam ] Other (See Comments)   Psychosis   Azulfidine [sulfasalazine] Other (See Comments)   Headache    Epipen [epinephrine] Hypertension   Flagyl [metronidazole] Hives   Motrin [ibuprofen] Nausea Only, Other (See Comments)   Told not to take medication due to GI distress caused by crohn's disease.   Nsaids Other (See Comments)   Told not to take NSAIDs due to GI distress caused by crohn's disease   Penicillins Hives   12/2021, 11/2023 tolerated cephalosporins    Morphine And Codeine  Itching   Told not to take due to GI distress caused by crohn's disease        Medication List     TAKE these medications  acetaminophen -codeine  300-60 MG tablet Commonly known as: TYLENOL  #4 Take 1 tablet by mouth 4 (four) times daily as needed (for back pain for Crohn's).   allopurinol  100 MG tablet Commonly known as: ZYLOPRIM  Take 50 mg by mouth daily.   ALPRAZolam  1 MG tablet Commonly known as:  XANAX  Take 1 mg by mouth 2 (two) times daily.   apixaban  5 MG Tabs tablet Commonly known as: ELIQUIS  Take 5 mg by mouth 2 (two) times daily.   atenolol  25 MG tablet Commonly known as: Tenormin  Take 1 tablet (25 mg total) by mouth daily.   colestipol  1 g tablet Commonly known as: COLESTID  Take 1 tablet (1 g total) by mouth 2 (two) times daily as needed (diarrhea).   cyanocobalamin 1000 MCG/ML injection Commonly known as: VITAMIN B12 Inject 1,000 mcg into the muscle every 30 (thirty) days.   cyclobenzaprine  5 MG tablet Commonly known as: FLEXERIL  Take 5 mg by mouth daily as needed for muscle spasms.   doxycycline  100 MG tablet Commonly known as: VIBRA -TABS Take 1 tablet (100 mg total) by mouth 2 (two) times daily for 5 days.   ezetimibe  10 MG tablet Commonly known as: ZETIA  Take 10 mg by mouth at bedtime.   fenofibrate  micronized 200 MG capsule Commonly known as: LOFIBRA Take 200 mg by mouth daily.   furosemide  40 MG tablet Commonly known as: LASIX  Take 40 mg by mouth daily.   ICY HOT EX Apply 1 Application topically daily as needed (foot pain).   levalbuterol  45 MCG/ACT inhaler Commonly known as: XOPENEX  HFA Inhale 2 puffs into the lungs 2 (two) times daily as needed for shortness of breath or wheezing.   nitroGLYCERIN  0.4 MG SL tablet Commonly known as: NITROSTAT  Place 0.4 mg under the tongue every 5 (five) minutes as needed for chest pain.   omeprazole 40 MG capsule Commonly known as: PRILOSEC Take 40 mg by mouth daily before breakfast.   oxyCODONE  5 MG immediate release tablet Commonly known as: Oxy IR/ROXICODONE  Take 1 tablet (5 mg total) by mouth every 4 (four) hours as needed for up to 5 days for severe pain (pain score 7-10).   pregabalin  50 MG capsule Commonly known as: LYRICA  Take 50 mg by mouth 2 (two) times daily.   spironolactone  25 MG tablet Commonly known as: Aldactone  Take 0.5 tablets (12.5 mg total) by mouth daily.   Thick-It  Powd Generic drug: STARCH-MALTO DEXTRIN Make liquids nectar thick. Kindly dispense any thickening product to make liquids nectar thick consistency, 1 month supply no refills.   Trelegy Ellipta  200-62.5-25 MCG/ACT Aepb Generic drug: Fluticasone -Umeclidin-Vilant Inhale 1 puff into the lungs daily.   Vitamin D  (Ergocalciferol ) 1.25 MG (50000 UNIT) Caps capsule Commonly known as: DRISDOL  Take 50,000 Units by mouth every 7 (seven) days.   Voltaren  Arthritis Pain 1 % Gel Generic drug: diclofenac  Sodium Apply 1 Application topically in the morning and at bedtime.        Allergies  Allergen Reactions   Methylprednisolone  Other (See Comments)    Increased BP, HR, agitation, hallucinations    Shellfish Allergy Nausea And Vomiting    All SEAFOOD   Ativan  [Lorazepam ] Other (See Comments)    Psychosis   Azulfidine [Sulfasalazine] Other (See Comments)    Headache    Epipen [Epinephrine] Hypertension   Flagyl [Metronidazole] Hives   Motrin [Ibuprofen] Nausea Only and Other (See Comments)    Told not to take medication due to GI distress caused by crohn's disease.   Nsaids Other (See Comments)  Told not to take NSAIDs due to GI distress caused by crohn's disease   Penicillins Hives    12/2021, 11/2023 tolerated cephalosporins    Morphine And Codeine  Itching    Told not to take due to GI distress caused by crohn's disease    Consultations: None   Procedures/Studies: CT Chest Wo Contrast Result Date: 06/11/2024 EXAM: CT CHEST WITHOUT CONTRAST 06/11/2024 08:41:25 PM TECHNIQUE: CT of the chest was performed without the administration of intravenous contrast. The exam is somewhat limited due to the lack of IV contrast. Multiplanar reformatted images are provided for review. Automated exposure control, iterative reconstruction, and/or weight based adjustment of the mA/kV was utilized to reduce the radiation dose to as low as reasonably achievable. COMPARISON: Plain film from earlier in  the same day, CT from 03/31/2024. CLINICAL HISTORY: Persistent cough for 2 months despite treatment. FINDINGS: MEDIASTINUM: The heart is mildly enlarged in size. Pacing device is noted and stable. No pericardial abnormality is seen. Atherosclerotic calcifications of the aorta are noted. No aneurysmal dilatation is seen. The central airways are clear. The esophagus is unremarkable. Thoracic inlet is within normal limits. No hilar or mediastinal abnormality seen. LYMPH NODES: No mediastinal, hilar or axillary lymphadenopathy. LUNGS AND PLEURA: The lungs are well aerated bilaterally. Emphysematous changes are noted throughout, similar to that seen on prior exams. There is again noted chronic atelectasis in the right middle lobe. Scattered small less than 5 mm subpleural nodules are noted, stable from prior exams but not significant by size criteria. No sizable nodule is seen. Previously seen right basilar infiltrative changes have resolved in the interval. No new focal infiltrate is seen. No pulmonary edema. No pleural effusion or pneumothorax. SOFT TISSUES/BONES: No acute bony abnormality is seen. No acute abnormality of the soft tissues. UPPER ABDOMEN: Limited images of the upper abdomen shows renal cysts on the left. No follow-up is recommended. IMPRESSION: 1. No acute cardiopulmonary process . 2. Chronic atelectasis in the right middle lobe. 3. Scattered small subpleural nodules less than 5 mm, stable from prior exams and below size thresholds for routine follow-up per Fleischner Society guidelines. Electronically signed by: Oneil Devonshire MD 06/11/2024 08:56 PM EST RP Workstation: [id number]   CT HEAD WO CONTRAST ( ) Result Date: 06/11/2024 EXAM: CT HEAD WITHOUT CONTRAST 06/11/2024 08:41:25 PM TECHNIQUE: CT of the head was performed without the administration of intravenous contrast. Automated exposure control, iterative reconstruction, and/or weight based adjustment of the mA/kV was utilized to reduce the  radiation dose to as low as reasonably achievable. COMPARISON: 03/31/2024 CLINICAL HISTORY: Mental status change, unknown cause FINDINGS: BRAIN AND VENTRICLES: No acute hemorrhage. No evidence of acute infarct. No hydrocephalus. No extra-axial collection. No mass effect or midline shift. ORBITS: No acute abnormality. SINUSES: No acute abnormality. SOFT TISSUES AND SKULL: No acute soft tissue abnormality. No skull fracture. IMPRESSION: 1. No acute intracranial abnormality. Electronically signed by: Franky Crease MD 06/11/2024 08:45 PM EST RP Workstation: [id number]   DG Chest Port 1 View Result Date: 06/11/2024 EXAM: 1 VIEW(S) XRAY OF THE CHEST 06/11/2024 07:10:00 PM COMPARISON: 05/30/2024. CLINICAL HISTORY: AMS FINDINGS: LUNGS AND PLEURA: Slight increased density is noted in the right base, which may represent a posteriorly layering effusion. The lungs are well aerated bilaterally. No other focal abnormality is seen. No pneumothorax. HEART AND MEDIASTINUM: The cardiac shadow is prominent. The pacemaker is again seen and stable. BONES AND SOFT TISSUES: No acute osseous abnormality. IMPRESSION: 1. Slight increased density in the right lung base, possibly representing a posteriorly  layering effusion. Electronically signed by: Oneil Devonshire MD 06/11/2024 07:14 PM EST RP Workstation: [id number]   DG Chest 2 View Result Date: 05/30/2024 CLINICAL DATA:  Chest pain and shortness of breath. EXAM: CHEST - 2 VIEW COMPARISON:  Chest radiograph dated 04/07/2024. FINDINGS: No focal consolidation, pleural effusion, pneumothorax. Stable cardiac silhouette. Left pectoral pacemaker device. No acute osseous pathology. IMPRESSION: No active cardiopulmonary disease. Electronically Signed   By: Vanetta Chou M.D.   On: 05/30/2024 17:14   (Echo, Carotid, EGD, Colonoscopy, ERCP)    Subjective: Patient seen and examined.  She has some back pain and she which she can use some pain medication.  She thinks some oxycodone  will help  her to get to rehab.  She also follows up with pain management clinic.  Denies any confusion.  Agreeable with plan to go home.   Discharge Exam: Vitals:   06/13/24 0628 06/13/24 0856  BP:    Pulse:    Resp:    Temp: 98.2 F (36.8 C)   SpO2:  97%   Vitals:   06/13/24 0439 06/13/24 0440 06/13/24 0628 06/13/24 0856  BP:      Pulse:      Resp:      Temp: (!) 95.3 F (35.2 C) (!) 97.1 F (36.2 C) 98.2 F (36.8 C)   TempSrc: Axillary Rectal Oral   SpO2:    97%    General: Pt is alert, awake, not in acute distress Pleasant and interactive.  Not in any distress.  She is walking back from bathroom independently. Cardiovascular: RRR, S1/S2 +, no rubs, no gallops, pacemaker in place. Respiratory: CTA bilaterally, no wheezing, no rhonchi Abdominal: Soft, NT, ND, bowel sounds + Extremities: no edema, no cyanosis    The results of significant diagnostics from this hospitalization (including imaging, microbiology, ancillary and laboratory) are listed below for reference.     Microbiology: Recent Results (from the past 240 hours)  Resp panel by RT-PCR (RSV, Flu A&B, Covid) Anterior Nasal Swab     Status: None   Collection Time: 06/11/24  6:28 PM   Specimen: Anterior Nasal Swab  Result Value Ref Range Status   SARS Coronavirus 2 by RT PCR NEGATIVE NEGATIVE Final    Comment: (NOTE) SARS-CoV-2 target nucleic acids are NOT DETECTED.  The SARS-CoV-2 RNA is generally detectable in upper respiratory specimens during the acute phase of infection. The lowest concentration of SARS-CoV-2 viral copies this assay can detect is 138 copies/mL. A negative result does not preclude SARS-Cov-2 infection and should not be used as the sole basis for treatment or other patient management decisions. A negative result may occur with  improper specimen collection/handling, submission of specimen other than nasopharyngeal swab, presence of viral mutation(s) within the areas targeted by this assay, and  inadequate number of viral copies(<138 copies/mL). A negative result must be combined with clinical observations, patient history, and epidemiological information. The expected result is Negative.  Fact Sheet for Patients:  bloggercourse.com  Fact Sheet for Healthcare Providers:  seriousbroker.it  This test is no t yet approved or cleared by the [place]  FDA and  has been authorized for detection and/or diagnosis of SARS-CoV-2 by FDA under an Emergency Use Authorization (EUA). This EUA will remain  in effect (meaning this test can be used) for the duration of the COVID-19 declaration under Section 564(b)(1) of the Act, 21 U.S.C.section 360bbb-3(b)(1), unless the authorization is terminated  or revoked sooner.       Influenza A by PCR NEGATIVE NEGATIVE  Final   Influenza B by PCR NEGATIVE NEGATIVE Final    Comment: (NOTE) The Xpert Xpress SARS-CoV-2/FLU/RSV plus assay is intended as an aid in the diagnosis of influenza from Nasopharyngeal swab specimens and should not be used as a sole basis for treatment. Nasal washings and aspirates are unacceptable for Xpert Xpress SARS-CoV-2/FLU/RSV testing.  Fact Sheet for Patients: bloggercourse.com  Fact Sheet for Healthcare Providers: seriousbroker.it  This test is not yet approved or cleared by the [place]  FDA and has been authorized for detection and/or diagnosis of SARS-CoV-2 by FDA under an Emergency Use Authorization (EUA). This EUA will remain in effect (meaning this test can be used) for the duration of the COVID-19 declaration under Section 564(b)(1) of the Act, 21 U.S.C. section 360bbb-3(b)(1), unless the authorization is terminated or revoked.     Resp Syncytial Virus by PCR NEGATIVE NEGATIVE Final    Comment: (NOTE) Fact Sheet for Patients: bloggercourse.com  Fact Sheet for Healthcare  Providers: seriousbroker.it  This test is not yet approved or cleared by the [place]  FDA and has been authorized for detection and/or diagnosis of SARS-CoV-2 by FDA under an Emergency Use Authorization (EUA). This EUA will remain in effect (meaning this test can be used) for the duration of the COVID-19 declaration under Section 564(b)(1) of the Act, 21 U.S.C. section 360bbb-3(b)(1), unless the authorization is terminated or revoked.  Performed at [facility], [address], [place], [place] [address]   Blood culture (routine x 2)     Status: None (Preliminary result)   Collection Time: 06/11/24  7:54 PM   Specimen: BLOOD  Result Value Ref Range Status   Specimen Description   Final    BLOOD LEFT ANTECUBITAL Performed at [facility], [address] [place], [place] [address]    Special Requests   Final    BOTTLES DRAWN AEROBIC AND ANAEROBIC Blood Culture adequate volume Performed at [facility], [address] [place], [place] [address]    Culture   Final    NO GROWTH 2 DAYS Performed at [facility], [address] [place], [place] [address]    Report Status PENDING  Incomplete  Blood culture (routine x 2)     Status: None (Preliminary result)   Collection Time: 06/11/24  8:55 PM   Specimen: BLOOD  Result Value Ref Range Status   Specimen Description   Final    BLOOD RIGHT ANTECUBITAL Performed at [facility], [address], [place], [place] [address]    Special Requests   Final    BOTTLES DRAWN AEROBIC AND ANAEROBIC Blood Culture adequate volume Performed at [facility], [address], [place], [place] [address]    Culture   Final    NO GROWTH 2 DAYS Performed at [facility], [address] [place], [place] [address]    Report Status PENDING  Incomplete     Labs: BNP (last 3 results) Recent Labs    04/02/24 0036  04/07/24 1530 05/31/24 1751  BNP 779.2* 575.9* 285.6*   Basic Metabolic Panel: Recent Labs  Lab 06/11/24 1828 06/12/24 0320  NA 139 141  K 4.2 3.8  CL 99 103  CO2 27 27  GLUCOSE 109* 107*  BUN 24* 25*  CREATININE 1.31* 1.24*  CALCIUM 9.9 8.8*   Liver Function Tests: Recent Labs  Lab 06/11/24 1828  AST 47*  ALT 12  ALKPHOS 50  BILITOT 1.0  PROT 8.4*  ALBUMIN 4.6   No results for input(s): LIPASE, AMYLASE in the last 168 hours. No results for input(s): AMMONIA in the last 168 hours. CBC: Recent Labs  Lab 06/11/24 1828 06/12/24 0320  WBC 15.0* 9.8  NEUTROABS 13.0*  --   HGB 13.2 11.0*  HCT 42.2 35.1*  MCV 90.8 90.9  PLT 298 225   Cardiac Enzymes: No results for input(s): CKTOTAL, CKMB, CKMBINDEX, TROPONINI in the last 168 hours. BNP: Invalid input(s): POCBNP CBG: No results for input(s): GLUCAP in the last 168 hours. D-Dimer No results for input(s): DDIMER in the last 72 hours. Hgb A1c No results for input(s): HGBA1C in the last 72 hours. Lipid Profile No results for input(s): CHOL, HDL, LDLCALC, TRIG, CHOLHDL, LDLDIRECT in the last 72 hours. Thyroid  function studies No results for input(s): TSH, T4TOTAL, T3FREE, THYROIDAB in the last 72 hours.  Invalid input(s): FREET3 Anemia work up No results for input(s): VITAMINB12, FOLATE, FERRITIN, TIBC, IRON, RETICCTPCT in the last 72 hours. Urinalysis    Component Value Date/Time   COLORURINE STRAW (A) 06/11/2024 1753   APPEARANCEUR CLEAR 06/11/2024 1753   LABSPEC 1.006 06/11/2024 1753   PHURINE 5.0 06/11/2024 1753   GLUCOSEU NEGATIVE 06/11/2024 1753   HGBUR NEGATIVE 06/11/2024 1753   BILIRUBINUR NEGATIVE 06/11/2024 1753   KETONESUR NEGATIVE 06/11/2024 1753   PROTEINUR NEGATIVE 06/11/2024 1753   NITRITE NEGATIVE 06/11/2024 1753   LEUKOCYTESUR TRACE (A) 06/11/2024 1753   Sepsis Labs Recent Labs  Lab 06/11/24 1828 06/12/24 0320  WBC 15.0* 9.8    Microbiology Recent Results (from the past 240 hours)  Resp panel by RT-PCR (RSV, Flu A&B, Covid) Anterior Nasal Swab     Status: None   Collection Time: 06/11/24  6:28 PM   Specimen: Anterior Nasal Swab  Result Value Ref Range Status   SARS Coronavirus 2 by RT PCR NEGATIVE NEGATIVE Final    Comment: (NOTE) SARS-CoV-2 target nucleic acids are NOT DETECTED.  The SARS-CoV-2 RNA is generally detectable in upper respiratory specimens during the acute phase of infection. The lowest concentration of SARS-CoV-2 viral copies this assay can detect is 138 copies/mL. A negative result does not preclude SARS-Cov-2 infection and should not be used as the sole basis for treatment or other patient management decisions. A negative result may occur with  improper specimen collection/handling, submission of specimen other than nasopharyngeal swab, presence of viral mutation(s) within the areas targeted by this assay, and inadequate number of viral copies(<138 copies/mL). A negative result must be combined with clinical observations, patient history, and epidemiological information. The expected result is Negative.  Fact Sheet for Patients:  bloggercourse.com  Fact Sheet for Healthcare Providers:  seriousbroker.it  This test is no t yet approved or cleared by the [place]  FDA and  has been authorized for detection and/or diagnosis of SARS-CoV-2 by FDA under an Emergency Use Authorization (EUA). This EUA will remain  in effect (meaning this test can be used) for the duration of the COVID-19 declaration under Section 564(b)(1) of the Act, 21 U.S.C.section 360bbb-3(b)(1), unless the authorization is terminated  or revoked sooner.       Influenza A by PCR NEGATIVE NEGATIVE Final   Influenza B by PCR NEGATIVE NEGATIVE Final    Comment: (NOTE) The Xpert Xpress SARS-CoV-2/FLU/RSV plus assay is intended as an aid in the diagnosis of influenza  from Nasopharyngeal swab specimens and should not be used as a sole basis for treatment. Nasal washings and aspirates are unacceptable for Xpert Xpress SARS-CoV-2/FLU/RSV testing.  Fact Sheet for Patients: bloggercourse.com  Fact Sheet for Healthcare Providers: seriousbroker.it  This test is not yet approved or cleared by the [place]  FDA and has been authorized for detection and/or diagnosis of SARS-CoV-2 by FDA under an Emergency Use Authorization (EUA). This EUA will remain in effect (meaning this test can be used) for the duration of the COVID-19 declaration under Section 564(b)(1) of the Act, 21 U.S.C. section 360bbb-3(b)(1), unless the authorization is terminated or revoked.     Resp Syncytial Virus by PCR NEGATIVE NEGATIVE Final    Comment: (NOTE) Fact Sheet for Patients: bloggercourse.com  Fact Sheet for Healthcare Providers: seriousbroker.it  This test is not yet approved or cleared by the [place]  FDA and has been authorized for detection and/or diagnosis of SARS-CoV-2 by FDA under an Emergency Use Authorization (EUA). This EUA will remain in effect (meaning this test can be used) for the duration of the COVID-19 declaration under Section 564(b)(1) of the Act, 21 U.S.C. section 360bbb-3(b)(1), unless the authorization is terminated or revoked.  Performed at [facility], [address], [place], [place] [address]   Blood culture (routine x 2)     Status: None (Preliminary result)   Collection Time: 06/11/24  7:54 PM   Specimen: BLOOD  Result Value Ref Range Status   Specimen Description   Final    BLOOD LEFT ANTECUBITAL Performed at [facility], [address] [place], [place] [address]    Special Requests   Final    BOTTLES DRAWN AEROBIC AND ANAEROBIC Blood Culture adequate volume Performed at [facility], [address] [place], [place] [address]    Culture   Final    NO GROWTH 2 DAYS Performed at [facility], [address] [place], [place] [address]    Report Status PENDING  Incomplete  Blood culture (routine x 2)     Status: None (Preliminary result)   Collection Time: 06/11/24  8:55 PM   Specimen: BLOOD  Result Value Ref Range Status   Specimen Description   Final    BLOOD RIGHT ANTECUBITAL Performed at [facility], [address] [place], [place] [address]    Special Requests   Final    BOTTLES DRAWN AEROBIC AND ANAEROBIC Blood Culture adequate volume Performed at [facility], [address] [place], [place] [address]    Culture   Final    NO GROWTH 2 DAYS Performed at [facility], [address], [place], [place] [address]    Report Status PENDING  Incomplete     Time coordinating discharge: 35 minutes  SIGNED:   Renato Applebaum, MD  [facility] 06/13/2024, 2:03 PM

## 2024-06-13 NOTE — Progress Notes (Signed)
   06/13/24 0438 06/13/24 0439 06/13/24 0440  Vitals  Temp (!) 95 F (35 C) (!) 95.3 F (35.2 C) (!) 97.1 F (36.2 C)  Temp Source Oral Axillary Rectal    Patient was asleep with her mouth open with no blanket on. See above temperatures.   Interventions: 2 warm blankets placed on patient, temperature increased in room.   Rn notified Lynwood Kipper, NP via secure chat of the above information.  Outcome: 98.6 oral temp

## 2024-06-13 NOTE — TOC Transition Note (Addendum)
 Transition of Care [facility]) - Discharge Note   Patient Details  Name: Brittany Irwin MRN: [id number] Date of Birth: 09/06/1940  [facility] CM/SW Contact:  Doneta Glenys DASEN, RN Phone Number: 06/13/2024, 10:28 AM   Clinical Narrative:    CM received consult for [facility] PT. Patient just completed HH PT and politely decided that she does not need additional PT.  Dr. Raenelle updated.   Final next level of care: Home/Self Care Barriers to Discharge: No Barriers Identified   Patient Goals and CMS Choice Patient states their goals for this hospitalization and ongoing recovery are:: Home CMS Medicare.gov Compare Post Acute Care list provided to::  (NA) Choice offered to / list presented to : NA [place] ownership interest in [facility].provided to:: Parent NA    Discharge Placement                       Discharge Plan and Services Additional resources added to the After Visit Summary for   In-house Referral: NA Discharge Planning Services: CM Consult Post Acute Care Choice: NA          DME Arranged: N/A DME Agency: NA       HH Arranged: NA HH Agency: NA        Social Drivers of Health (SDOH) Interventions SDOH Screenings   Food Insecurity: No Food Insecurity (06/11/2024)  Housing: Low Risk  (06/11/2024)  Transportation Needs: No Transportation Needs (06/11/2024)  Utilities: Not At Risk (06/11/2024)  Financial Resource Strain: Low Risk  (10/14/2023)   Received from [facility]  Recent Concern: Financial Resource Strain - Medium Risk (07/27/2023)   Received from [facility]  Physical Activity: Insufficiently Active (07/27/2023)   Received from [facility]  Social Connections: Moderately Isolated (06/11/2024)  Stress: No Stress Concern Present (07/27/2023)   Received from [facility]  Tobacco Use: Medium Risk (06/11/2024)     Readmission Risk Interventions    06/13/2024   10:20 AM 04/03/2024   10:40 AM 10/02/2023    2:35 PM   Readmission Risk Prevention Plan  Transportation Screening Complete Complete Complete  HRI or Home Care Consult   Complete  Social Work Consult for Recovery Care Planning/Counseling   Complete  Palliative Care Screening   Not Applicable  Medication Review [employer]) Complete Referral to Pharmacy Complete  PCP or Specialist appointment within 3-5 days of discharge Complete Complete   HRI or Home Care Consult Complete Complete   SW Recovery Care/Counseling Consult Complete Complete   Palliative Care Screening Not Applicable Not Applicable   Skilled Nursing Facility Not Applicable Not Applicable

## 2024-06-16 LAB — CULTURE, BLOOD (ROUTINE X 2)
Culture: NO GROWTH
Culture: NO GROWTH
Special Requests: ADEQUATE
Special Requests: ADEQUATE

## 2024-08-03 ENCOUNTER — Encounter (HOSPITAL_COMMUNITY): Payer: Self-pay

## 2024-08-03 ENCOUNTER — Emergency Department (HOSPITAL_COMMUNITY)

## 2024-08-03 ENCOUNTER — Emergency Department (HOSPITAL_COMMUNITY)
Admission: EM | Admit: 2024-08-03 | Discharge: 2024-08-03 | Disposition: A | Discharge status: Home / Self Care | Attending: Emergency Medicine | Admitting: Emergency Medicine

## 2024-08-03 ENCOUNTER — Other Ambulatory Visit: Payer: Self-pay

## 2024-08-03 DIAGNOSIS — J449 Chronic obstructive pulmonary disease, unspecified: Secondary | ICD-10-CM | POA: Diagnosis not present

## 2024-08-03 DIAGNOSIS — R079 Chest pain, unspecified: Secondary | ICD-10-CM | POA: Insufficient documentation

## 2024-08-03 DIAGNOSIS — M546 Pain in thoracic spine: Secondary | ICD-10-CM | POA: Insufficient documentation

## 2024-08-03 DIAGNOSIS — M545 Low back pain, unspecified: Secondary | ICD-10-CM | POA: Insufficient documentation

## 2024-08-03 DIAGNOSIS — Z7901 Long term (current) use of anticoagulants: Secondary | ICD-10-CM | POA: Diagnosis not present

## 2024-08-03 DIAGNOSIS — I509 Heart failure, unspecified: Secondary | ICD-10-CM | POA: Insufficient documentation

## 2024-08-03 LAB — TROPONIN T, HIGH SENSITIVITY
Troponin T High Sensitivity: 62 ng/L — ABNORMAL HIGH (ref 0–19)
Troponin T High Sensitivity: 47 ng/L — ABNORMAL HIGH (ref 0–19)

## 2024-08-03 LAB — BASIC METABOLIC PANEL WITH GFR
Anion gap: 11 (ref 5–15)
BUN: 27 mg/dL — ABNORMAL HIGH (ref 8–23)
CO2: 26 mmol/L (ref 22–32)
Calcium: 9.7 mg/dL (ref 8.9–10.3)
Chloride: 102 mmol/L (ref 98–111)
Creatinine, Ser: 0.97 mg/dL (ref 0.44–1.00)
GFR, Estimated: 58 mL/min — ABNORMAL LOW
Glucose, Bld: 105 mg/dL — ABNORMAL HIGH (ref 70–99)
Potassium: 4.2 mmol/L (ref 3.5–5.1)
Sodium: 139 mmol/L (ref 135–145)

## 2024-08-03 LAB — CBC
HCT: 39.2 % (ref 36.0–46.0)
Hemoglobin: 12.4 g/dL (ref 12.0–15.0)
MCH: 28.4 pg (ref 26.0–34.0)
MCHC: 31.6 g/dL (ref 30.0–36.0)
MCV: 89.9 fL (ref 80.0–100.0)
Platelets: 194 K/uL (ref 150–400)
RBC: 4.36 MIL/uL (ref 3.87–5.11)
RDW: 14.5 % (ref 11.5–15.5)
WBC: 6.4 K/uL (ref 4.0–10.5)
nRBC: 0 % (ref 0.0–0.2)

## 2024-08-03 MED ORDER — LIDOCAINE 5 % EX PTCH
2.0000 | MEDICATED_PATCH | CUTANEOUS | Status: DC
  Administered 2024-08-03: 2 via TRANSDERMAL
  Filled 2024-08-03: qty 2

## 2024-08-03 MED ORDER — HYDROMORPHONE HCL 1 MG/ML IJ SOLN
0.5000 mg | Freq: Once | INTRAMUSCULAR | Status: AC
  Administered 2024-08-03: 0.5 mg via INTRAVENOUS
  Filled 2024-08-03: qty 1

## 2024-08-03 MED ORDER — METHOCARBAMOL 500 MG PO TABS
500.0000 mg | ORAL_TABLET | Freq: Once | ORAL | Status: AC
  Administered 2024-08-03: 500 mg via ORAL
  Filled 2024-08-03: qty 1

## 2024-08-03 MED ORDER — LIDOCAINE 5 % EX PTCH: 1.0000 | MEDICATED_PATCH | CUTANEOUS | 0 refills | Status: AC

## 2024-08-03 MED ORDER — METHOCARBAMOL 500 MG PO TABS: 500.0000 mg | ORAL_TABLET | Freq: Two times a day (BID) | ORAL | 0 refills | Status: DC

## 2024-08-03 NOTE — Discharge Instructions (Signed)
 I would like you to follow-up with your cardiologist, please inform them about your episode of chest pain today.  Continue taking all of your prescribed medications.  If I have written you a prescription for Robaxin  which is a muscle relaxer which she can take as prescribed as needed for pain.  I would like you to continue to follow-up with your spine doctor as well.

## 2024-08-03 NOTE — ED Provider Notes (Signed)
 " [place] [facility] Provider Note   CSN: [id number] Arrival date & time: 08/03/24  9374     Patient presents with: Back Pain and Chest Pain   Brittany Irwin is a 84 y.o. female.    Back Pain Associated symptoms: chest pain   Chest Pain Associated symptoms: back pain    Patient is an 84 year old female with a past medical history significant for chronic back pain, CHF, COPD, pneumonia  Patient presents emergency room today with complaints of back pain.  It seems that she was having some chest pain earlier today but that completely resolved around the time that she arrived to the emergency department.  Patient states that she has mid back pain which is severe, 10/10 constant achy pain she states has been ongoing for years but feels worse today.  She denies any numbness or weakness no slurred speech or confusion.  No chest or abdominal trauma.  No back trauma or falls.  No numbness of lower extremities no saddle anesthesia no bowel or bladder incontinence.     Prior to Admission medications  Medication Sig Start Date End Date Taking? Authorizing Provider  acetaminophen -codeine  (TYLENOL  #4) 300-60 MG tablet Take 1 tablet by mouth 4 (four) times daily as needed (for back pain for Crohn's). 05/30/24  Yes [provider]  allopurinol  (ZYLOPRIM ) 100 MG tablet Take 50 mg by mouth daily. 05/02/24  Yes [provider]  ALPRAZolam  (XANAX ) 1 MG tablet Take 1 mg by mouth 2 (two) times daily. 01/02/22  Yes [provider]  apixaban  (ELIQUIS ) 5 MG TABS tablet Take 5 mg by mouth 2 (two) times daily.   Yes [provider]  atenolol  (TENORMIN ) 25 MG tablet Take 1 tablet (25 mg total) by mouth daily. 02/16/24  Yes Sheikh, Omair Latif, DO  colestipol  (COLESTID ) 1 g tablet Take 1 tablet (1 g total) by mouth 2 (two) times daily as needed (diarrhea). 04/03/24  Yes Amin, Ankit C, MD  cyanocobalamin (VITAMIN B12) 1000 MCG/ML injection  Inject 1,000 mcg into the muscle every 30 (thirty) days.   Yes [provider]  cyclobenzaprine  (FLEXERIL ) 5 MG tablet Take 5 mg by mouth daily as needed for muscle spasms. 10/13/22  Yes [provider]  diclofenac  Sodium (VOLTAREN  ARTHRITIS PAIN) 1 % GEL Apply 1 Application topically in the morning and at bedtime.   Yes [provider]  ezetimibe  (ZETIA ) 10 MG tablet Take 10 mg by mouth at bedtime. 12/31/21  Yes [provider]  fenofibrate  micronized (LOFIBRA) 200 MG capsule Take 200 mg by mouth daily. 12/31/21  Yes [provider]  Fluticasone -Umeclidin-Vilant (TRELEGY ELLIPTA ) 200-62.5-25 MCG/ACT AEPB Inhale 1 puff into the lungs daily. 12/22/23  Yes   furosemide  (LASIX ) 40 MG tablet Take 40 mg by mouth daily. Or as directed by md   Yes [provider]  levalbuterol  (XOPENEX  HFA) 45 MCG/ACT inhaler Inhale 2 puffs into the lungs 2 (two) times daily as needed for shortness of breath or wheezing. 05/26/24  Yes [provider]  lidocaine  (LIDODERM ) 5 % Place 1 patch onto the skin daily. Remove & Discard patch within 12 hours or as directed by MD 08/03/24  Yes Julien Berryman, Hamp RAMAN, PA  Menthol , Topical Analgesic, (ICY HOT EX) Apply 1 Application topically daily as needed (foot pain).   Yes [provider]  methocarbamol  (ROBAXIN ) 500 MG tablet Take 1 tablet (500 mg total) by mouth 2 (two) times daily. 08/03/24  Yes Neldon Hamp  S, PA  nitroGLYCERIN  (NITROSTAT ) 0.4 MG SL tablet Place 0.4 mg under the tongue every 5 (five) minutes as needed for chest pain. 05/09/24  Yes [provider]  omeprazole (PRILOSEC) 40 MG capsule Take 40 mg by mouth daily before breakfast.   Yes [provider]  oxycodone  (OXY-IR) 5 MG capsule Take 5 mg by mouth every 4 (four) hours as needed for pain.   Yes [provider]  pregabalin  (LYRICA ) 50 MG capsule Take 50 mg by mouth 2 (two) times daily.   Yes [provider]   spironolactone  (ALDACTONE ) 25 MG tablet Take 0.5 tablets (12.5 mg total) by mouth daily. 03/05/24 05/31/25 Yes Dennise Lavada POUR, MD  Vitamin D , Ergocalciferol , (DRISDOL ) 1.25 MG (50000 UNIT) CAPS capsule Take 50,000 Units by mouth every 7 (seven) days.   Yes [provider]  STARCH-MALTO DEXTRIN (THICK-IT) POWD Make liquids nectar thick. Kindly dispense any thickening product to make liquids nectar thick consistency, 1 month supply no refills. Patient not taking: Reported on 06/12/2024 03/05/24   Singh, Prashant K, MD    Allergies: Doxycycline , Epipen [epinephrine], Flagyl [metronidazole], Methylprednisolone , Motrin [ibuprofen], Nsaids, Penicillins, Shellfish allergy, Ativan  [lorazepam ], Azulfidine [sulfasalazine], and Morphine and codeine     Review of Systems  Cardiovascular:  Positive for chest pain.  Musculoskeletal:  Positive for back pain.    Updated Vital Signs BP 132/75 (BP Location: Right Arm)   Pulse 80   Temp (!) 97.4 F (36.3 C)   Resp (!) 24   Ht 5' 4 (1.626 m)   Wt 74 kg   SpO2 100%   BMI 28.00 kg/m   Physical Exam Vitals and nursing note reviewed.  Constitutional:      General: She is not in acute distress. HENT:     Head: Normocephalic and atraumatic.     Nose: Nose normal.     Mouth/Throat:     Mouth: Mucous membranes are moist.  Eyes:     General: No scleral icterus. Cardiovascular:     Rate and Rhythm: Normal rate and regular rhythm.     Pulses: Normal pulses.     Heart sounds: Normal heart sounds.     Comments: Radial and DP pulses are symmetric and easily palpable. Pulmonary:     Effort: Pulmonary effort is normal. No respiratory distress.     Breath sounds: No wheezing.  Abdominal:     Palpations: Abdomen is soft.     Tenderness: There is no abdominal tenderness. There is no guarding or rebound.  Musculoskeletal:     Cervical back: Normal range of motion.     Right lower leg: No edema.     Left lower leg: No edema.     Comments:  Discomfort in back with movement but no palpable midline C, T, L-spine tenderness.  Skin:    General: Skin is warm and dry.     Capillary Refill: Capillary refill takes less than 2 seconds.  Neurological:     Mental Status: She is alert. Mental status is at baseline.  Psychiatric:        Mood and Affect: Mood normal.        Behavior: Behavior normal.     (all labs ordered are listed, but only abnormal results are displayed) Labs Reviewed  BASIC METABOLIC PANEL WITH GFR - Abnormal; Notable for the following components:      Result Value   Glucose, Bld 105 (*)    BUN 27 (*)    GFR, Estimated 58 (*)  All other components within normal limits  TROPONIN T, HIGH SENSITIVITY - Abnormal; Notable for the following components:   Troponin T High Sensitivity 62 (*)    All other components within normal limits  TROPONIN T, HIGH SENSITIVITY - Abnormal; Notable for the following components:   Troponin T High Sensitivity 47 (*)    All other components within normal limits  CBC    EKG: EKG Interpretation Date/Time:  Thursday August 03 2024 06:47:14 EST Ventricular Rate:  59 PR Interval:  243 QRS Duration:  155 QT Interval:  486 QTC Calculation: 482 R Axis:   266  Text Interpretation: Sinus rhythm Atrial premature complex Prolonged PR interval Right bundle branch block Inferior infarct, acute Anterior infarct, old Lateral leads are also involved No significant change since last tracing Nonspecific ST and T wave abnormality Confirmed by Charlyn Sora [phone]) on 08/03/2024 9:09:45 AM  Radiology: No results found.    Procedures   Medications Ordered in the ED  HYDROmorphone  (DILAUDID ) injection 0.5 mg (0.5 mg Intravenous Given 08/03/24 0828)  methocarbamol  (ROBAXIN ) tablet 500 mg (500 mg Oral Given 08/03/24 1127)                                    Medical Decision Making Amount and/or Complexity of Data Reviewed Labs: ordered. Radiology: ordered.  Risk Prescription drug  management.   This patient presents to the ED for concern of CP/back pain, this involves a number of treatment options, and is a complaint that carries with it a moderate risk of complications and morbidity. A differential diagnosis was considered for the patient's symptoms which is discussed below:   The emergent differential diagnosis for back pain includes but is not limited to fracture, muscle strain, cauda equina, spinal stenosis. DDD, ankylosing spondylitis, acute ligamentous injury, disk herniation, spondylolisthesis, Epidural compression syndrome, metastatic cancer, transverse myelitis, vertebral osteomyelitis, diskitis, kidney stone, pyelonephritis, AAA, Perforated ulcer, Retrocecal appendicitis, pancreatitis, bowel obstruction, retroperitoneal hemorrhage or mass, meningitis.    Co morbidities: Discussed in HPI   Brief History:  Patient is an 84 year old female with a past medical history significant for chronic back pain, CHF, COPD, pneumonia  Patient presents emergency room today with complaints of back pain.  It seems that she was having some chest pain earlier today but that completely resolved around the time that she arrived to the emergency department.  Patient states that she has mid back pain which is severe, 10/10 constant achy pain she states has been ongoing for years but feels worse today.  She denies any numbness or weakness no slurred speech or confusion.  No chest or abdominal trauma.  No back trauma or falls.  No numbness of lower extremities no saddle anesthesia no bowel or bladder incontinence.       EMR reviewed including pt PMHx, past surgical history and past visits to ER.   See HPI for more details   Lab Tests:   I ordered and independently interpreted labs. Labs notable for Troponin elevated but lower than baseline and without upward trend.  BMP and CBC unremarkable  Imaging Studies:  Chest x-ray unremarkable Persistent cardiomegaly   Cardiac  Monitoring:  The patient was maintained on a cardiac monitor.  I personally viewed and interpreted the cardiac monitored which showed an underlying rhythm of: NSR EKG non-ischemic abnormal EKG   Medicines ordered:  I ordered medication including Robaxin , Dilaudid , Lidoderm  patches for pain Reevaluation of the  patient after these medicines showed that the patient resolved I have reviewed the patients home medicines and have made adjustments as needed   Critical Interventions:     Consults/Attending Physician  I discussed this case with my attending physician who cosigned this note including patient's presenting symptoms, physical exam, and planned diagnostics and interventions. Attending physician stated agreement with plan or made changes to plan which were implemented.   Attending physician assessed patient at bedside.    Reevaluation:  After the interventions noted above I re-evaluated patient and found that they have :resolved   Social Determinants of Health:      Problem List / ED Course:  Patient with chest pain that resolved with 1 dose of nitroglycerin  and ongoing back pain which has been a chronic issue for her.  She feels asymptomatic now after her back pain was treated I have a very low suspicion for a dissection I did offer a CT dissection study however she feels much improved and would be discharged home.  Will discharge patient home.  Return precautions emergency room were provided.   Dispostion:  After consideration of the diagnostic results and the patients response to treatment, I feel that the patent would benefit from outpatient follow-up.       Final diagnoses:  Acute low back pain without sciatica, unspecified back pain laterality  Chest pain, unspecified type    ED Discharge Orders          Ordered    methocarbamol  (ROBAXIN ) 500 MG tablet  2 times daily        08/03/24 1235    lidocaine  (LIDODERM ) 5 %  Every 24 hours        08/03/24  1235               Neldon Inoue [place], [place] 08/11/24 1543  "

## 2024-08-03 NOTE — ED Triage Notes (Signed)
 Pt arrived from home c/o chest pain that no longer exists. Pt states that her mid back hurts 10/10. Pt states that it is chronic pain, but worse today than usual. Pt states that she took a SL nitroglycerin  around midnight that stopped the chest pain, but the back pain continued. Pt states that she also took an oxycodone  last night approx 2300

## 2024-08-03 NOTE — ED Notes (Signed)
 Assisted patient to stand by the bed. Patient was not not to have her friend assist her to stand with out staff in room for safety reason

## 2024-08-09 NOTE — Progress Notes (Signed)
 08/09/2024   Problems / Subjective    Brittany Irwin, Brittany Irwin, is a 84 y.o. female who presents today accompanied by her sister Brittany Irwin for f/up visit:  Hospital F/UP 08/09/2024  Back pain. Needs referral to pain management.  Tylenol  is  not controlling her chronic back pain that is worsening. Using heating pad, ice, lidocaine  patches, without significant relief.   Hypertension 08/09/2024 Atenolol  25 mg qd Losartan  50 mg qd Lasix  60 mg bid Managed by Cardiology, Dr Luke.  Hypertrophic cardiomyopathy and diastolic heart failure,  Chronic CHF. She is on 4L oxygen continuously via nasal cannula. Followed by cardiologist, Dr. Luke. Echo scheduled, f/up with cardiology in 03/2024  Vitamin D  deficiency,  Last 25-OH vitamin D  51.7 in 03/2023, 115 in 07/2023 Well-controlled. Manages with:  Vitamin D  50K units once weekly.  COPD, stage 2 moderate,  Follows with pulmonology, Dr. Bascom. On 2L of oxygen continuously via nasal cannula. Her O2 sat was 98% on 2L oxygen today, 05/23/2020. Does have some moderate dyspnea on exertion. She is not in acute distress today. She is stable. Followed by pulmonology. Wheezing today bilaterally. 11/10/23. Reports using inhalers regularly  Chronic anxiety  Long term use of Xanax  1 mg bid per her report Discussed concerns of high dose of Xanax .  She states she doesn't always take it three times per day. Pt defers referral to psychiatrist for further eval, 03/05/23  Dicussed concerns of high dose of Xanax  and other medications she is on that can be sedatives, also takes Tylenol  #4, carbamazepine , cyclobenzaprine  and Lyrica  that can affect mental status and central nervous system   Defers speaking to phsych at this time, says she doesn't always take 2 times a day Ambien -tried tapering off but has been unsucessful  Xanax  bid at this time-does not always take bid    Vitamin B-12 deficiency  Dyslipidemia,   Last lipid panel in 07/2023: TC 101,  trigs 149, HDL 29, LDL 46 Seems to be okay. Currently managing with:  Zetia  10 mg qd  Fenofibrate  200 mg  Neuropathy  Pt currently taking:  Lyrica  50 mg nightly Pt says that it helps some, but not enough Pt afraid that if she takes 2 tablets, that it will make her dizzy. Instructed pt that she can take 1 tab in the AM and one mid afternoon to see if that helps to control the symptoms. Just reminded her that she is on a low dose and there is definitely room to titrate up if need be She is being weaned off Tegretol  Carbemazepine Followed by neurology    Increase muscle spasms in feet and legs   As well as head and jaw Has been in contact with neurology who she has been seeing for neuropathy and muscle spasms and they said to have labs at PCP.  Has appt here 02/10/24 Also swelling, warm, redness, and pain in right side of right foot  Hx of gout and thinks it may be related.  Discussed other possible dx, needs to go to urgent care to have foot examined to see if it is gout, a clot, or something else.    Hospitalized 02/25/23  for COPD Pacemaker placed 2 weeks ago On continuous O2, 2 liters Wears nasal canula Followed by pulmonology   CHF Followed by cardiology  Hx of migraines and syncope On long-term Carbamazepine  400 mg bid. Pt reports incr tremor - question side effects of medication Will refer to neurologist 03/05/23  C/o LE weakness with tremors and tingling  and numbness due to neuropathy Requests referral to neurologist 03/05/23   Reports nose bleed occasionally Uses Moisture Gel On continuous O2 Offered referral to ENT but pt deferred 03/05/23   Hx of Crohn's disease,  Follows with GI.   Hx of Gout,  Went to urgent care for Gout  They gave her a short course of allopurinol . Would like refills She states they did not want her taking Colchicine but she is not sure why, maybe bc of decrease renal function. She is no longer taking the colchicine. Started  Allopurinol  03/17/24 due to elevated uric acid  Psoriasis flare Clobetasol cream.    Hospital f.up  for Afib recently. Suggested she have stress test. But she wanted to talk to us  and cardiology first.   Concerned about confusion and memory  Hard time with memory at night. Waking up confused. Getting up in morning and not knowing what is going on. Sister is concerned. They think it started with over sudation in hospital. Reports f/up with neurology. Rec'd memory eval and workup by neurology.    Pneumonia.  Sent home on doxycycline . Was supposed to take for 5 days after getting home but it was too hard on stomach and Chron's disease. She only completed 3 days. Discussed home health to see if we could prevent some hospitalizations. PT declines   ER visit 04/11/2024  Heart failure and was discharged back to home  Has seen nephrology and rec'd staying on spironolactone  and furosemide    Will f/up with cardiology   [facility] patient records/labs/Xrays  obtained and reviewed    Past Medical History, Past Surgery History, Allergies, Medications, Social History, and Family History were reviewed and updated.   Review of Systems  Constitutional:  negative for anorexia, fever, weight loss.  Eyes:  negative for diplopia, blurry vision, pain.  Cardiovascular: negative for chest pain, orthopnea, PND.  Respiratory: negative for SOB, cough, wheezing  GI:  negative for N/V, abdominal pain.  GU:  negative for dysuria, frequency, hematuria.  Skin:  negative for pruritus, rash, ulcers.  Hematologic/lymphatic:  negative for anemia, bleeding tendency, enlarged L Nod  Musculoskeletal: negative for arthralgias and myalgias  Neurological:  negative for headache,visual changes,   dizziness, paraesthesias ,seizures.  Physical exam / Objective  BP 125/77 (BP Location: Right Upper Arm, Patient Position: Lying)   Pulse 69   Ht 5' 4 (1.626 m)   Wt 155 lb (70.3 kg)   LMP  (LMP Unknown)   BMI 26.61 kg/m    General:  NAD, conversant  Neck: supple, no nodes  Thyroid : normal size without nodules or tenderness  Lungs:  clear to P & A  CV:  RRR, no MRGs, normal carotid upstroke  Abdomen:  soft, nontender, no masses or HSM  Extremities: full range of motion, no edema or cyanosis  Neurologic:  no tremor  Skin:  no rash, lesions or ulcers  Psychiatric:  alert and orientated to person, place and time    Impression / Assessment   Acute bronchitis 07/2016.   Rx: Asmanex, clarithromycin and phenergan with codeine . Fractured rib with ER evaluation 05/2016; resolved. Long-term problems with dentition precipitated/aggravated by immunosuppressant medications including prednisone.  Rx:  Clindamycin 300 mg 1 po tid x10 days due to patient's PCN allergy.   Patient has Tylenol  #4 for pain   Situation is medical-derived rather than dental alone; evaluated by dentist/Oral Surgeon.  4. Long-term history of problems with left hip and knee   Spinal stenosis suspected.   Evaluated by orthopedic  spinal specialist, Dr. Candise with MRI of L-S     spine as well as femur with negative significant findings.   Using narcotic analgesics used for a time.   Dr. Wynn evaluated 2016 with symptoms improving with injections.   Follow on evaluation with Neurosurgeon, Dr Delores, 2016.   and Dr. Wynn as of 04/2015. 5. Long-term problems with Rt shoulder; previously followed by Dr. Albertina;    steroid injections. 6. Concerns for abdominal hernia with no signs of one as of 10/25/13. 7. Long-term hx of bronchitis, especially in the wintertime.  8. Crohn's disease, longstanding.    Recurrent bowel obstructions requiring numerous hospitalizations     and surgeries.    Followed by Dr. Rosabel with surgeries by Dr. Melba.    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     Humira injections were used but dc'd 2014 due to cost.  9. Episodic visual changes.  Followed by Dr. Gretel.   10. Poor dentition.  11. History of migraine headaches.    Treated with multiple medications.    Previously followed by Dr. Gretel.  12. Chronic anxiety related to multiple maladies.    On alprazolam  for many years.   13. Persistent arthralgias and myalgias.    Followed by rheumatology.  14. Hypertrophy cardiomyopathy with episodic chest pain.    Additionally occasion labile HTN.   Followed by Dr. Abel.  15. Decompensated congestive heart failure in 2011.  16. Degenerative joint disease.  17. Psoriasis.    Treated with topical steroids. 18. Degenerative disk disease.  19. Ocular sicca.  20. Vancomycin -resistant Enterococcus.  21. Left rotator cuff tear.  22. History of nephrolithiasis.  23. Questionable hilar mass 2005 with follow up chest x-ray ruling it out.  24. Long time history of urinary urgency.  25. Osteopenia.  26. Vitamin D  deficiency.  27. Hypertriglyceridemia.  28. Sleep walker.  29. B12 deficiency.   On B12 injections monthly.   30. Pneumonia.   Dx:  Amanda and treated at hospital in 08/2014.   Resolved. 31. Hospitalized at [facility] 12/2016 for CHF.   Multiple medications adjusted. 32. Lung disease; followed by pulmonology. On O2 oxygen as of 05/2017.       33. Possible UTI, 11/07/20.   Pt is coming in today with possible UTI.   Pt says for the past couple of days she has been having pressure in her lower abdomen.   Denies any dysuria.   Said she has been having pain and pressure in her lower abdomen.   Denies any back pain.   Denies any type of fevers.   So she came in today to be checked for a UTI.   UA performed in office today just shows a trace of blood.   Being that pt is symptomatic and is having sx, we will treat her as if it is a UTI.   Will go ahead and get a urine culture ordered to confirm.             34. Dx with postural benign vertigo.   She has been eval by ENT dr and was referred for PT.   She said her dizzy spells are rare now but does  use meclizine at home prn.   Meclizine 12.5 mg prn once a day, may incr to bid if she needs to.   She was advised to take Flonase  nasal spray bid.   She was also advised to contact  O2 supply company and to make sure she can humidify her O2 at home.   35.   DDD and chronic back pain.   Previously on Flexeril  but this was d/c'd due to contraindication with heart failure   Manages with:    Baclofen  10 mg 1-2 times daily as needed, started on 03/2019.    Cymbalta 60 mg qd.   She requested refills today, 11/02/19.  36.   Peripheral neuropathy, worsening.   Gabapentin  100 mg q phs.   She would like to see a neurologist as she has not seen one in many years.            37.     Back pain, 01/14/21.   She says her rt back has been bothering her.   Currently, rates pain 9/10.   Concerned on whether or not it could be related to or associated with her kidneys.   UA performed in office today, and results came back negative.   Was looking in pt's records for prior imaging in regards to her back pain to see whether or not she had had any diagnostic testing for her back.   So, looks like back in 2019, she had an MRI of her back which showed L5-S1 mild rt lower foraminal stenosis.    Also L4-L5 she had mild anterolisthesis and also bilateral neural foraminal stenosis and also some arthritis in her back as well.   Pt said at one time she had seen Dr. Wynn, but she has not seen him in years.   Did tell we can just go ahead and get another MRI of her back to see what has changed since 2019.   She said she will just wait until she can get in to see Dr. Wynn and let him order any additional testing.   So will be referring her to orthopedic surgeon for further eval of her back.            38.  Rt thumb swelling, 12/20/20.   Pt says her rt thumb has still been a little bit sore and still has some swelling in it.   Pt had had gout in rt thumb previously.   Will check uric acid level today just to make  sure she is not having another gout flare up.   Did tell pt that if by chance that if this is going to be an ongoing problem with gout, we may want to put her on something for prophylaxis tx, but thus far, pt has only had 1 episode and the flare up did not quite go away as pt did not take medication as prescribed the first time.   So we will keep an eye just to make sure this is not going to be an ongoing problem.     RESOLVED PROBLEMS:   39.    Fall in 03/2019.   Has been having some headaches since then.   Requesting referral to neurology.   Referral placed today, 08/03/19.             40.    Hospitalization at [facility] from 02/14/21-02/21/21.   Pt was admitted with pneumonia due to COVID virus.   Spent 8 days in Critical Care Unit.   Since the COVID, pt states she just feels a little bit foggy, doesn't remember things, just having a little bit of brain fog.   Also, sometimes she gets a little SOB.   Other than that, she is doing okay.  She took her last dose of steroids on 02/25/21.   Also as far as HTN, the hospital d/c'd her atenolol .   Since then, she has seen her cardiologist who has resumed her atenolol  so she is back on her 25 mg 1/2 tab qd, managed by cardiology.   Being that pt did have COVID pneumonia, will get a repeat CXR in about 6 weeks.   After that, we will her to f/up with me here in the office.  41.  Hyponatremia.        Health Maintenance Mammogram: 07/18 Colonoscopy: 07/2017 Medicare Wellness: 01/24/19 Annual Physical Exam: 08/03/19 DXA: 09/2008, 01/2019  Pneumonia Vaccine: 04/2017, sent 01/24/19 Flu Vaccine: 05/23/2020 Tetanus: 01/2012 Shingles: sent 01/24/19 COVID-19: has been vaccinated. Return Visit: 3 mos.                   Health Maintenance  Topic Date Due   Urine Drug Testing  Never done   Hepatitis A Vaccine (1 of 2 - Risk 2-dose series) Never done   RSV Adult and Pregnancy (1 - 1-dose 75+ series) Never done   TSH Level  02/15/2022    Annual Spirometry Testing  02/11/2024   COVID-19 Vaccine (3 - 2025-26 season) 03/20/2024   Medicare Annual Wellness  07/22/2024   Zoster Vaccine (1 of 2) 11/08/2024 (Originally 06/08/1991)   DTaP/Tdap/Td Vaccines (2 - Td or Tdap) 11/08/2024 (Originally 02/03/2022)   Microalbumin/Creatinine Ratio  07/22/2033 (Originally 09/09/41)   Creatinine Level  01/29/2025   CBC  01/29/2025   Lipid Panel  02/23/2025   DEXA Scan  05/11/2025   Potassium Level  08/01/2025   Sodium  08/01/2025   GFR/EGFR  08/01/2025   BUN  08/01/2025   Echocardiogram  03/04/2026   COPD Spirometry Testing  Completed   Pneumococcal Vaccine: 50+ Years  Completed   Influenza Vaccine  Completed   Meningococcal B Vaccine  Aged Out   Meningococcal Conjugate Vaccine  Aged Out   Colorectal Cancer Screening  Discontinued   Mammogram  Discontinued   V58.69  Long Term Use of High Risk Meds: Pt on  med(s) with potential adverse hepatic, renal, and/or bone marrow effects; will check follow appropriate lab tests.  Plan   F/up with neurology and cardiology  Orders Placed This Encounter  Procedures   CMP14+LP+CBC/D/Plt   Thyroid  Panel With TSH   AMB Referral to Pain Management    Patient's Medications  New Prescriptions   No medications on file  Previous Medications   ACETAMINOPHEN -CODEINE  (TYLENOL  #4) 300-60 MG PER TABLET    Take one tablet by mouth every 4 (four) hours as needed for Pain for up to 30 days.   ALLOPURINOL  (ZYLOPRIM ) 100 MG TABLET    Take one half tablet (50 mg dose) by mouth daily.   ALPRAZOLAM  (XANAX ) 1 MG TABLET    Take one tablet (1 mg dose) by mouth 2 (two) times a day as needed for Sleep. Max Daily Amount: 2 mg   APIXABAN  (ELIQUIS ) 5 MG TABLET    Take one tablet (5 mg dose) by mouth 2 (two) times daily.   ATENOLOL  (TENORMIN ) 25 MG TABLET    Take one tablet (25 mg dose) by mouth daily.   COLESTIPOL  (COLESTID ) 1 G TABLET    Take one tablet (1 g dose) by mouth 2 (two) times a day  as needed.   CYANOCOBALAMIN (VITAMIN B-12) 1000 MCG/ML INJECTION    Inject 1 mL (1,000 mcg dose) into the muscle every 30 (thirty) days.  ERGOCALCIFEROL  (VITAMIN D , ERGOCALCIFEROL ,) 50,000 UNITS CAPS CAPSULE    Take one capsule (50,000 Units dose) by mouth once a week.   EZETIMIBE  (ZETIA ) 10 MG TABLET    TAKE ONE TABLET BY MOUTH AT BEDTIME   FENOFIBRATE  MICRONIZED (LOFIBRA) 200 MG CAPSULE    Take one capsule (200 mg dose) by mouth 30 (thirty) minutes before breakfast.   FUROSEMIDE  (LASIX ) 40 MG TABLET    Take one tablet (40 mg dose) by mouth daily.   LEVALBUTEROL  (XOPENEX  HFA) 45 MCG/ACT INHALER    Inhale one puff into the lungs every 4 (four) hours as needed for Wheezing.   LEVALBUTEROL  (XOPENEX ) 1.25 MG/3ML NEBULIZER SOLUTION    Take 3 mLs (1.25 mg dose) by nebulization every 4 (four) hours as needed for Wheezing.   LIDOCAINE  (LIDODERM ) 5%    Place one patch onto the skin daily.   LORATADINE  (CLARITIN ) 10 MG TABLET    Take one tablet (10 mg dose) by mouth daily.   MENTHOL , TOPICAL ANALGESIC, 2.5 % GEL    Apply 1 Application topically daily as needed.   METHOCARBAMOL  (ROBAXIN ) 500 MG TABLET    Take one tablet (500 mg dose) by mouth 2 (two) times daily.   NALOXONE HCL 4 MG/0.1ML LIQD NASAL SPRAY    one spray by Nasal route once as needed for up to 1 dose.   NITROGLYCERIN  (NITROSTAT ) 0.4 MG SL TABLET    Place one tablet (0.4 mg dose) under the tongue every 5 (five) minutes as needed for Chest pain.   OMEPRAZOLE (PRILOSEC) 40 MG CAPSULE    Take one capsule (40 mg dose) by mouth 30 (thirty) minutes before breakfast.   OXYCODONE  (OXY-IR) 5 MG CAPSULE    Take one capsule (5 mg dose) by mouth every 4 (four) hours as needed for Pain.   OXYGEN    3-4 L continuous.   PREGABALIN  (LYRICA ) 50 MG CAPSULE    Take one capsule (50 mg dose) by mouth daily AND two capsules (100 mg dose) at bedtime. Max Daily Amount: 150 mg.   RISAQUAD (RISAQUAD) CAPS    Take one capsule by mouth 3 (three) times a day.    SPIRONOLACTONE  (ALDACTONE ) 25 MG TABLET    Take one half tablet (12.5 mg dose) by mouth daily.   TRELEGY ELLIPTA  200-62.5-25 MCG/ACT AEPB INHALER    Inhale 1 puff into the lungs daily.  Modified Medications   No medications on file  Discontinued Medications   No medications on file      Follow up if symptoms worsen or fail to improve, for as scheduled.    Return as needed if change in condition or symptoms worsen.  Risks, benefits, and alternatives of the treatment plan and medications prescribed today were discussed, and patient expressed understanding.        Delon KATHEE Gosling, NP 08/09/2024, 11:42 AM

## 2024-08-11 NOTE — Progress Notes (Signed)
 Please contact the patient concerning their result with the following instructions:  Your labs are back and show the following: 1. Normal Blood glucose or sugar* 2. Abnormal but stable kidney function. Continue to followup with nephrology 3. Normal electrolytes and minerals: Sodium Potassium,Calcium 4. Normal proteins in the blood 5. Normal liver function tests 6. Normal White and Red  BloodCellCount 7. Normal Blood level or Hemoglobin ( No anemia) 8. Normal thryoid function

## 2024-08-18 ENCOUNTER — Observation Stay (HOSPITAL_COMMUNITY)

## 2024-08-18 ENCOUNTER — Encounter (HOSPITAL_COMMUNITY): Payer: Self-pay

## 2024-08-18 ENCOUNTER — Emergency Department (HOSPITAL_COMMUNITY)

## 2024-08-18 ENCOUNTER — Observation Stay (HOSPITAL_COMMUNITY)
Admission: EM | Admit: 2024-08-18 | Discharge: 2024-08-20 | Disposition: A | Attending: Internal Medicine | Admitting: Internal Medicine

## 2024-08-18 ENCOUNTER — Other Ambulatory Visit: Payer: Self-pay

## 2024-08-18 DIAGNOSIS — M109 Gout, unspecified: Secondary | ICD-10-CM | POA: Insufficient documentation

## 2024-08-18 DIAGNOSIS — G629 Polyneuropathy, unspecified: Secondary | ICD-10-CM | POA: Diagnosis not present

## 2024-08-18 DIAGNOSIS — M549 Dorsalgia, unspecified: Secondary | ICD-10-CM | POA: Insufficient documentation

## 2024-08-18 DIAGNOSIS — R079 Chest pain, unspecified: Secondary | ICD-10-CM

## 2024-08-18 DIAGNOSIS — G8929 Other chronic pain: Secondary | ICD-10-CM | POA: Diagnosis not present

## 2024-08-18 DIAGNOSIS — Z87891 Personal history of nicotine dependence: Secondary | ICD-10-CM | POA: Insufficient documentation

## 2024-08-18 DIAGNOSIS — Z7901 Long term (current) use of anticoagulants: Secondary | ICD-10-CM | POA: Insufficient documentation

## 2024-08-18 DIAGNOSIS — Z79899 Other long term (current) drug therapy: Secondary | ICD-10-CM | POA: Insufficient documentation

## 2024-08-18 DIAGNOSIS — I5033 Acute on chronic diastolic (congestive) heart failure: Secondary | ICD-10-CM | POA: Insufficient documentation

## 2024-08-18 DIAGNOSIS — N1831 Chronic kidney disease, stage 3a: Secondary | ICD-10-CM | POA: Diagnosis present

## 2024-08-18 DIAGNOSIS — Z950 Presence of cardiac pacemaker: Secondary | ICD-10-CM | POA: Diagnosis present

## 2024-08-18 DIAGNOSIS — J9611 Chronic respiratory failure with hypoxia: Secondary | ICD-10-CM | POA: Diagnosis not present

## 2024-08-18 DIAGNOSIS — E785 Hyperlipidemia, unspecified: Secondary | ICD-10-CM | POA: Insufficient documentation

## 2024-08-18 DIAGNOSIS — R7989 Other specified abnormal findings of blood chemistry: Secondary | ICD-10-CM | POA: Diagnosis not present

## 2024-08-18 DIAGNOSIS — I5032 Chronic diastolic (congestive) heart failure: Secondary | ICD-10-CM | POA: Diagnosis not present

## 2024-08-18 DIAGNOSIS — K5090 Crohn's disease, unspecified, without complications: Secondary | ICD-10-CM | POA: Diagnosis not present

## 2024-08-18 DIAGNOSIS — I21A1 Myocardial infarction type 2: Secondary | ICD-10-CM | POA: Diagnosis present

## 2024-08-18 DIAGNOSIS — J9601 Acute respiratory failure with hypoxia: Secondary | ICD-10-CM

## 2024-08-18 DIAGNOSIS — I4891 Unspecified atrial fibrillation: Secondary | ICD-10-CM | POA: Diagnosis not present

## 2024-08-18 DIAGNOSIS — I495 Sick sinus syndrome: Secondary | ICD-10-CM | POA: Diagnosis not present

## 2024-08-18 DIAGNOSIS — I10 Essential (primary) hypertension: Secondary | ICD-10-CM | POA: Diagnosis present

## 2024-08-18 DIAGNOSIS — J449 Chronic obstructive pulmonary disease, unspecified: Secondary | ICD-10-CM | POA: Diagnosis present

## 2024-08-18 DIAGNOSIS — F419 Anxiety disorder, unspecified: Secondary | ICD-10-CM | POA: Diagnosis present

## 2024-08-18 DIAGNOSIS — M4800 Spinal stenosis, site unspecified: Secondary | ICD-10-CM

## 2024-08-18 DIAGNOSIS — I5030 Unspecified diastolic (congestive) heart failure: Secondary | ICD-10-CM | POA: Diagnosis present

## 2024-08-18 DIAGNOSIS — I130 Hypertensive heart and chronic kidney disease with heart failure and stage 1 through stage 4 chronic kidney disease, or unspecified chronic kidney disease: Secondary | ICD-10-CM | POA: Diagnosis not present

## 2024-08-18 DIAGNOSIS — R002 Palpitations: Secondary | ICD-10-CM | POA: Diagnosis present

## 2024-08-18 DIAGNOSIS — K219 Gastro-esophageal reflux disease without esophagitis: Secondary | ICD-10-CM | POA: Diagnosis present

## 2024-08-18 DIAGNOSIS — N179 Acute kidney failure, unspecified: Secondary | ICD-10-CM | POA: Diagnosis present

## 2024-08-18 LAB — CBC
HCT: 38.7 % (ref 36.0–46.0)
Hemoglobin: 12.1 g/dL (ref 12.0–15.0)
MCH: 28.3 pg (ref 26.0–34.0)
MCHC: 31.3 g/dL (ref 30.0–36.0)
MCV: 90.6 fL (ref 80.0–100.0)
Platelets: 225 10*3/uL (ref 150–400)
RBC: 4.27 MIL/uL (ref 3.87–5.11)
RDW: 14.9 % (ref 11.5–15.5)
WBC: 8.8 10*3/uL (ref 4.0–10.5)
nRBC: 0 % (ref 0.0–0.2)

## 2024-08-18 LAB — ECHOCARDIOGRAM COMPLETE
AR max vel: 3.05 cm2
AV Area VTI: 3.39 cm2
AV Area mean vel: 3.17 cm2
AV Mean grad: 2 mmHg
AV Peak grad: 3.5 mmHg
Ao pk vel: 0.94 m/s
Area-P 1/2: 3.37 cm2
Calc EF: 64.5 %
Height: 64 in
MV M vel: 4.56 m/s
MV Peak grad: 83.2 mmHg
MV VTI: 1.75 cm2
P 1/2 time: 154 ms
S' Lateral: 2.6 cm
Single Plane A2C EF: 61.6 %
Single Plane A4C EF: 66.5 %
Weight: 2610.25 [oz_av]

## 2024-08-18 LAB — I-STAT CG4 LACTIC ACID, ED: Lactic Acid, Venous: 1.4 mmol/L (ref 0.5–1.9)

## 2024-08-18 LAB — COMPREHENSIVE METABOLIC PANEL WITH GFR
ALT: 5 U/L (ref 0–44)
AST: 35 U/L (ref 15–41)
Albumin: 3.7 g/dL (ref 3.5–5.0)
Alkaline Phosphatase: 44 U/L (ref 38–126)
Anion gap: 13 (ref 5–15)
BUN: 21 mg/dL (ref 8–23)
CO2: 24 mmol/L (ref 22–32)
Calcium: 9 mg/dL (ref 8.9–10.3)
Chloride: 99 mmol/L (ref 98–111)
Creatinine, Ser: 1.28 mg/dL — ABNORMAL HIGH (ref 0.44–1.00)
GFR, Estimated: 42 mL/min — ABNORMAL LOW
Glucose, Bld: 120 mg/dL — ABNORMAL HIGH (ref 70–99)
Potassium: 4.6 mmol/L (ref 3.5–5.1)
Sodium: 136 mmol/L (ref 135–145)
Total Bilirubin: 0.7 mg/dL (ref 0.0–1.2)
Total Protein: 6.6 g/dL (ref 6.5–8.1)

## 2024-08-18 LAB — TROPONIN T, HIGH SENSITIVITY
Troponin T High Sensitivity: 122 ng/L (ref 0–19)
Troponin T High Sensitivity: 161 ng/L (ref 0–19)
Troponin T High Sensitivity: 186 ng/L (ref 0–19)

## 2024-08-18 LAB — TSH: TSH: 2.09 u[IU]/mL (ref 0.350–4.500)

## 2024-08-18 LAB — PRO BRAIN NATRIURETIC PEPTIDE: Pro Brain Natriuretic Peptide: 4734 pg/mL — ABNORMAL HIGH

## 2024-08-18 MED ORDER — DICLOFENAC SODIUM 1 % EX GEL
4.0000 g | Freq: Four times a day (QID) | CUTANEOUS | Status: DC
  Administered 2024-08-18 – 2024-08-20 (×5): 4 g via TOPICAL
  Filled 2024-08-18 (×2): qty 100

## 2024-08-18 MED ORDER — SENNA 8.6 MG PO TABS
1.0000 | ORAL_TABLET | Freq: Every day | ORAL | Status: DC
  Administered 2024-08-18 – 2024-08-20 (×3): 8.6 mg via ORAL
  Filled 2024-08-18 (×3): qty 1

## 2024-08-18 MED ORDER — APIXABAN 5 MG PO TABS: 5.0000 mg | ORAL_TABLET | Freq: Two times a day (BID) | ORAL | Status: DC

## 2024-08-18 MED ORDER — OXYCODONE HCL 5 MG PO TABS
5.0000 mg | ORAL_TABLET | Freq: Four times a day (QID) | ORAL | Status: DC | PRN
  Administered 2024-08-18 – 2024-08-20 (×6): 5 mg via ORAL
  Filled 2024-08-18 (×6): qty 1

## 2024-08-18 MED ORDER — PREGABALIN 25 MG PO CAPS
25.0000 mg | ORAL_CAPSULE | Freq: Two times a day (BID) | ORAL | Status: DC
  Administered 2024-08-18 – 2024-08-20 (×4): 25 mg via ORAL
  Filled 2024-08-18 (×4): qty 1

## 2024-08-18 MED ORDER — EZETIMIBE 10 MG PO TABS
10.0000 mg | ORAL_TABLET | Freq: Every day | ORAL | Status: DC
  Administered 2024-08-18 – 2024-08-19 (×2): 10 mg via ORAL
  Filled 2024-08-18 (×2): qty 1

## 2024-08-18 MED ORDER — MUSCLE RUB 10-15 % EX CREA
TOPICAL_CREAM | CUTANEOUS | Status: DC | PRN
  Filled 2024-08-18: qty 85

## 2024-08-18 MED ORDER — ACETAMINOPHEN-CODEINE 300-60 MG PO TABS
1.0000 | ORAL_TABLET | Freq: Four times a day (QID) | ORAL | Status: DC | PRN
  Filled 2024-08-18: qty 1

## 2024-08-18 MED ORDER — ALLOPURINOL 100 MG PO TABS
50.0000 mg | ORAL_TABLET | Freq: Every day | ORAL | Status: DC
  Administered 2024-08-18 – 2024-08-20 (×3): 50 mg via ORAL
  Filled 2024-08-18 (×3): qty 1

## 2024-08-18 MED ORDER — APIXABAN 5 MG PO TABS
5.0000 mg | ORAL_TABLET | Freq: Two times a day (BID) | ORAL | Status: DC
  Administered 2024-08-18 – 2024-08-20 (×5): 5 mg via ORAL
  Filled 2024-08-18 (×5): qty 1

## 2024-08-18 MED ORDER — FUROSEMIDE 10 MG/ML IJ SOLN: 40.0000 mg | Freq: Once | INTRAMUSCULAR | Status: DC

## 2024-08-18 MED ORDER — ALPRAZOLAM 0.5 MG PO TABS
1.0000 mg | ORAL_TABLET | Freq: Two times a day (BID) | ORAL | Status: DC | PRN
  Administered 2024-08-18 – 2024-08-20 (×4): 1 mg via ORAL
  Filled 2024-08-18 (×4): qty 2

## 2024-08-18 MED ORDER — FENTANYL CITRATE (PF) 50 MCG/ML IJ SOSY
50.0000 ug | PREFILLED_SYRINGE | Freq: Once | INTRAMUSCULAR | Status: AC
  Administered 2024-08-18: 50 ug via INTRAVENOUS
  Filled 2024-08-18: qty 1

## 2024-08-18 MED ORDER — PANTOPRAZOLE SODIUM 40 MG PO TBEC
40.0000 mg | DELAYED_RELEASE_TABLET | Freq: Every day | ORAL | Status: DC
  Administered 2024-08-18 – 2024-08-20 (×3): 40 mg via ORAL
  Filled 2024-08-18 (×3): qty 1

## 2024-08-18 MED ORDER — ACETAMINOPHEN-CODEINE 300-30 MG PO TABS
1.0000 | ORAL_TABLET | ORAL | Status: DC | PRN
  Administered 2024-08-18 – 2024-08-20 (×3): 1 via ORAL
  Filled 2024-08-18 (×3): qty 1

## 2024-08-18 MED ORDER — AMIODARONE HCL IN DEXTROSE 360-4.14 MG/200ML-% IV SOLN
60.0000 mg/h | INTRAVENOUS | Status: AC
  Administered 2024-08-18 (×2): 60 mg/h via INTRAVENOUS
  Filled 2024-08-18 (×2): qty 200

## 2024-08-18 MED ORDER — CODEINE SULFATE 30 MG PO TABS: 30.0000 mg | ORAL_TABLET | ORAL | Status: DC | PRN

## 2024-08-18 MED ORDER — FUROSEMIDE 10 MG/ML IJ SOLN
40.0000 mg | Freq: Once | INTRAMUSCULAR | Status: AC
  Administered 2024-08-18: 40 mg via INTRAVENOUS
  Filled 2024-08-18: qty 4

## 2024-08-18 MED ORDER — CYCLOBENZAPRINE HCL 10 MG PO TABS
5.0000 mg | ORAL_TABLET | Freq: Every day | ORAL | Status: DC | PRN
  Administered 2024-08-18 (×2): 5 mg via ORAL
  Filled 2024-08-18 (×2): qty 1

## 2024-08-18 MED ORDER — POLYETHYLENE GLYCOL 3350 17 G PO PACK
17.0000 g | PACK | Freq: Every day | ORAL | Status: DC
  Administered 2024-08-18 – 2024-08-19 (×2): 17 g via ORAL
  Filled 2024-08-18 (×3): qty 1

## 2024-08-18 MED ORDER — AMIODARONE LOAD VIA INFUSION
150.0000 mg | Freq: Once | INTRAVENOUS | Status: AC
  Administered 2024-08-18: 150 mg via INTRAVENOUS
  Filled 2024-08-18: qty 83.34

## 2024-08-18 MED ORDER — AMIODARONE HCL IN DEXTROSE 360-4.14 MG/200ML-% IV SOLN
30.0000 mg/h | INTRAVENOUS | Status: DC
  Administered 2024-08-18 – 2024-08-19 (×3): 30 mg/h via INTRAVENOUS
  Filled 2024-08-18 (×4): qty 200

## 2024-08-18 MED ORDER — BUDESON-GLYCOPYRROL-FORMOTEROL 160-9-4.8 MCG/ACT IN AERO
2.0000 | INHALATION_SPRAY | Freq: Two times a day (BID) | RESPIRATORY_TRACT | Status: DC
  Administered 2024-08-18 – 2024-08-20 (×5): 2 via RESPIRATORY_TRACT
  Filled 2024-08-18: qty 5.9

## 2024-08-18 NOTE — Progress Notes (Signed)
"  °  Progress Note  Patient Name: Brittany Irwin Date of Encounter: 08/18/2024 [facility] Cardiologist: LUKE BASS, DO   Interval Summary    Feeling better this morning since converting to sinus rhythm.  Most with complaints of just feeling tired.  Vital Signs Vitals:   08/18/24 0645 08/18/24 0700 08/18/24 0715 08/18/24 0717  BP:    (!) 110/52  Pulse: 61 (!) 59 60 68  Resp: 12 13 17  (!) 21  Temp:      TempSrc:      SpO2: 100% 97% 97% 100%  Weight:      Height:       No intake or output data in the 24 hours ending 08/18/24 0758    08/18/2024    4:06 AM 08/03/2024    6:38 AM 05/30/2024    3:10 PM  Last 3 Weights  Weight (lbs) 163 lb 2.3 oz 163 lb 2.3 oz 163 lb 2.3 oz  Weight (kg) 74 kg 74 kg 74 kg      Telemetry/ECG  Atrial fibrillation with RVR--conversion to sinus rhythm @621  - Personally Reviewed  Physical Exam  GEN: No acute distress.   Neck: No JVD Cardiac: RRR, no murmurs, rubs, or gallops.  Respiratory: Clear to auscultation bilaterally. GI: Soft, nontender, non-distended  MS: No edema  Assessment & Plan   84 year old female with past medical history of chronic diastolic CHF, hypertension, COPD on 4 L nasal cannula chronically, Crohn's, CKD stage IIIa, PAF, tachybradycardia syndrome status post PPM who presented to the ED with palpitations.  Atrial fibrillation with RVR -- Diagnosed with paroxysmal atrial fibrillation back in the latter part of 2025.  Follows with cardiology through [facility] and noted to be asymptomatic with low burden at that time.  She was continued on Eliquis  5 mg twice daily as well as atenolol  for rate control -- Presented to the ED on 1/30 with complaints of palpitations that started around midnight and found to be in atrial fibrillation with RVR -- Initially placed on IV diltiazem  but this dropped her blood pressure.  She was transition to IV amiodarone  and converted to sinus rhythm around 620am.  Feels much better since  converting. -- Given her underlying lung disease, amiodarone  likely not a ideal medication long-term for her. May be able to continue short course and have her follow up with [facility] for further options. Not sure that she would be an ablation candidate   Acute on chronic HFpEF -- Echo 8/25 with LVEF of 60 to 65%, normal RV, mild MR -- Noted to be mildly volume overloaded on admission, s/p IV Lasix  40 mg x 1 this morning with good urine output -- will repeat IV lasix  40mg  for tomorrow  -- repeat echo   Elevated troponin Chest pressure -- High-sensitivity troponin 122>>161, does report that she had some chest heaviness though this came after she developed palpitations.  This is now resolved.  Suspect could be demand ischemia in the setting of rapid heart rates. -- check echo  Tachy-brady syndrome s/p PPM -- follows with [facility]   COPD/chronic respiratory failure with hypoxia on O2 at 4 L -- Follows with pulmonary as an outpatient  For questions or updates, please contact [facility] Please consult [email or web address] for contact info under         Signed, Manuelita Rummer, NP   "

## 2024-08-18 NOTE — ED Notes (Signed)
 Cards at [facility].

## 2024-08-18 NOTE — Hospital Course (Addendum)
 Feels like her breathing got a little bit worse when the weather got cold but no increased cough, productive cough, and questionable increase in oxygen requirements with new oxygen machine at home, felt like the dial was broken Last night was feeling tired after stressful call from a family member Woke up in the [place] to go to the bathroom and when she got back and lay down felt heart palpitations, chest pressure, and shortness of breath Took nitroglycerin  which helped a little bit with her chest pressure but she still felt poorly and felt her heart racing No recent changes in medicines and she took her medicines this morning other than allopurinol  and omeprazole Has not felt like this before Reports that she was diagnosed with pneumonia on 1/13 at [facility] ED and treated with doxycycline  but she only took this for 2 days She has had trouble controlling her pain from spinal stenosis and feels like this might be contributing a little bit She did see her primary care provider recently who recommended pain management clinic Lives in an apartment, independent Sounds like Senior living with possible assisted living for people who needed but she is in the independent part Denies  alcohol  or drug use Prior tobacco use   GI 2022  history of C. difficile, Crohn's disease,h/o multiple ileal resections with h/o SBOs.  She is followed by Dr Rosabel for Crohn's disease which had been treated in the past with Humira (D/Cd 2/2 heart failure), MTX (D/Cd 2/2 elevated LFTs and cirrhosis), Colestipol  (caused constipation), now*** on Tylenol  with Codeine  and Colestid  prn   1/31 She feels well this morning now that she's had a bowel movement. She has no acute concerns right now. She just spoke to her cardiologist who plans to stop her amiodarone  drip. She denies chest pain or shortness of breath, palpitations or dizziness. She is nervous about the weather. It is snowing.

## 2024-08-18 NOTE — ED Notes (Addendum)
 Cards at [facility]. Pt alert, NAD, calm, interactive, resps e/u, speaking in clear complete sentences, VSS, skin W&D.

## 2024-08-18 NOTE — ED Notes (Signed)
 Pain med being sent to floor, not available in ED. Pt taken to 6E27, floor notified. No changes. BLE leg cramping continues, LLE improved.

## 2024-08-18 NOTE — H&P (Signed)
 " Date: 08/18/2024               Patient Name:  Brittany Irwin MRN: [id number]  DOB: October 29, 1940 Age / Sex: 84 y.o., female   PCP: [name], MD         Medical Service: Internal Medicine Teaching Service         Attending Physician: Dr. Eben Reyes BROCKS, MD      First Contact: Dr. Sallyanne Primas, DO    Second Contact: Dr. Lonni Africa, DO         After Hours (After 5p/  First Contact Pager: [phone]  weekends / holidays): Second Contact Pager: [phone]   SUBJECTIVE   Chief Complaint: Palpitations  History of Present Illness: Brittany Irwin with a PMH of chronic diastolic CHF, hypertension, COPD on 4L [place], Crohn's disease, spinal stenosis, neuropathy, CKD stage IIIa, PAF, tachybradycardia syndrome s/p PPM presented to the ED with acute onset SOB, chest pressure, and palpitations that began in the middle of the night. She woke up to use the bathroom and upon returning to bed, she experienced sudden chest pressure, a racing heart sensation, and SOB. She self-administered nitroglycerin , which provided partial relief of the pressure, but her tachycardia and general malaise persisted, prompting her to hit her life alert. EMS arrived on scene and noted 3 lead ECG noted an irregularly irregular rhythm, AFIB with RVR at a rate of 130-150. 12 lead was connected and found no ST segment elevation or depression. Oxygen requirement increased to 6L [place] and was brought to the ED.   She reports that her baseline dyspnea has worsened slightly over the past few days, potentially exacerbated by recent cold weather, though she denies an worsening or productive cough. She noted a possible increase in her home oxygen requirements of 4L [place] but expressed concern that new oxygen concentrator may be malfunctioning, stating that the dial felt broken.  She also states she's been experiencing significant stress lately due to her spinal stenosis pain and family issues.   Patient's recent history is  significant for a diagnosis of pneumonia on 1/13 at [facility] ED, for which she was prescribed doxycycline ; however she only took the medication for 2 days and stopped taking it due to GI side effects. Other than this, patient states she has been compliant with her medication other than her allopurinol  and omeprazole since she has run out of these medications. However, EMS report states she may have missed a few doses of her Eliquis  in the past month.    ED Course: Vitals: pulse 132, RR 21, BP 112/77, satting 99% Labs: Gen5 TnT5 62 -> 54, hs troponin T 122 -> 161, CBC WNL, CMP Na 136, K 4.6, Cr 1.28, GFR 42, glucose 120, otherwise WNL Imaging: Chest xray with no acute cardiopulmonary abnormality on this limited rotated exam. Unchanged cardiomegaly with left chest cardiac pacing device.  Management: Given 100mL NS bolus, 10mg  cardizem  and became hypotensive to <100 SBP, fentanyl  50 mcg, and amiodarone  infusion   Past Medical History Past Medical History:  Diagnosis Date   Acute CHF (congestive heart failure) (HCC) 01/02/2022   AKI (acute kidney injury) 09/08/2020   Aspiration pneumonia (HCC) 01/05/2019   Atypical chest pain 03/11/2022   Bradycardia 03/14/2020   C. difficile colitis 04/03/2021   CHF (congestive heart failure) (HCC)    COPD (chronic obstructive pulmonary disease) (HCC)    Coronary artery disease    Tachycardia 05/31/2013     Meds:  Active Medications[1] Apixaban  (Eliquis )  5 mg PO BID Atenolol  (Tenormin ) 25 mg PO daily Furosemide  (Lasix ) 40 mg PO daily Spironolactone  (Aldactone ) 12.5 mg PO daily Allopurinol  (Zyloprim ) 50 mg PO daily Ezetimibe  (Zetia ) 10 mg PO nightly Fenofibrate  micronized (Lofibra) 200 mg PO daily, 30 minutes before breakfast Colestipol  (Colestid ) 1 g PO BID PRN Omeprazole (Prilosec) 40 mg PO daily, 30 minutes before breakfast Pregabalin  (Lyrica ) 50 mg PO daily and 100 mg PO nightly (total daily dose 150 mg) Alprazolam  (Xanax ) 1 mg PO BID PRN  sleep/anxiety (max 2 mg/day) Acetaminophen -codeine  (Tylenol  #4) 300-60 mg PO q4h PRN pain (max 6 tablets/day) Nitroglycerin  (Nitrostat ) 0.4 mg SL q5 min PRN chest pain Levalbuterol  (Xopenex ) 1.25 mg/3 mL nebulized q4h PRN wheezing Levalbuterol  (Xopenex  HFA) 45 mcg/actuation, inhale 1 puff q4h PRN wheezing Trelegy Ellipta  (fluticasone /umeclidinium/vilanterol) 200-62.5-25 mcg, inhale 1 puff daily Loratadine  (Claritin ) 10 mg PO daily Ergocalciferol  (Vitamin D2) 50,000 units PO weekly Cyanocobalamin (Vitamin B12) 1,000 mcg IM every 30 days Menthol  topical analgesic gel 2.5% apply topically daily PRN Naloxone nasal spray (Narcan) 1 spray intranasal once PRN opioid overdose RISaQuad (probiotic) 1 capsule PO TID Oxygen 3-4 L/min via nasal cannula continuously  Past Surgical History  Past Surgical History:  Procedure Laterality Date   ABDOMINAL HYSTERECTOMY     BOWEL RESECTION     CHOLECYSTECTOMY      Social:  Lives With: alone in senior living complex Occupation: retired Support: Sister lives 10 minutes away Level of Function: fully independent with ADLs PCP: Delon Gosling, NP Substances: Former smoker, quit in 2000. 68 pack year history   Allergies: Allergies as of 08/18/2024 - Review Complete 08/18/2024  Allergen Reaction Noted   Epipen [epinephrine] Hypertension 01/03/2022   Flagyl [metronidazole] Hives 01/03/2022   Medrol  [methylprednisolone ] Palpitations, Other (See Comments), and Hypertension 08/11/2022   Motrin [ibuprofen] Diarrhea, Nausea Only, and Other (See Comments) 01/03/2022   Nsaids Diarrhea, Nausea Only, and Other (See Comments) 01/03/2022   Penicillins Hives 01/03/2022   Shellfish allergy Nausea And Vomiting 09/07/2023   Vibramycin  [doxycycline ] Diarrhea, Nausea And Vomiting, and Other (See Comments) 08/03/2024   Fish allergy Nausea And Vomiting 08/18/2024   Thick-it Diarrhea and Nausea And Vomiting 08/18/2024   Ativan  [lorazepam ] Other (See Comments) 09/05/2023    Azulfidine [sulfasalazine] Other (See Comments) 01/03/2022   Morphine and codeine  Itching 01/03/2022    Review of Systems: A complete ROS was negative except as per HPI.   OBJECTIVE:   Physical Exam: Blood pressure 107/79, pulse (!) 59, temperature 98.2 F (36.8 C), temperature source Oral, resp. rate 15, height 5' 4 (1.626 m), weight 74 kg, SpO2 96%.  Constitutional: well-appearing elderly woman sitting in bed, in no acute distress HENT: normocephalic atraumatic, mucous membranes moist Eyes: conjunctiva non-erythematous Cardiovascular: regular rate and rhythm, no m/r/g Pulmonary/Chest: normal work of breathing on room air, lungs clear to auscultation bilaterally Abdominal: soft, non-tender, non-distended Skin: warm and dry Psych: Normal mood and affect  Labs: CBC    Component Value Date/Time   WBC 8.8 08/18/2024 0425   RBC 4.27 08/18/2024 0425   HGB 12.1 08/18/2024 0425   HCT 38.7 08/18/2024 0425   PLT 225 08/18/2024 0425   MCV 90.6 08/18/2024 0425   MCH 28.3 08/18/2024 0425   MCHC 31.3 08/18/2024 0425   RDW 14.9 08/18/2024 0425   LYMPHSABS 0.6 (L) 06/11/2024 1828   MONOABS 0.8 06/11/2024 1828   EOSABS 0.4 06/11/2024 1828   BASOSABS 0.1 06/11/2024 1828     CMP     Component Value Date/Time   NA  136 08/18/2024 0425   NA 140 10/21/2022 1633   K 4.6 08/18/2024 0425   CL 99 08/18/2024 0425   CO2 24 08/18/2024 0425   GLUCOSE 120 (H) 08/18/2024 0425   BUN 21 08/18/2024 0425   BUN 35 (H) 10/21/2022 1633   CREATININE 1.28 (H) 08/18/2024 0425   CALCIUM 9.0 08/18/2024 0425   PROT 6.6 08/18/2024 0425   ALBUMIN 3.7 08/18/2024 0425   AST 35 08/18/2024 0425   ALT 5 08/18/2024 0425   ALKPHOS 44 08/18/2024 0425   BILITOT 0.7 08/18/2024 0425   GFRNONAA 42 (L) 08/18/2024 0425    Imaging: Chest xray with no acute cardiopulmonary abnormality on this limited rotated exam. Unchanged cardiomegaly with left chest cardiac pacing device.   EKG: personally reviewed my  interpretation is AFIB with RVR converted to sinus rhythm at 0621  ASSESSMENT & PLAN:   Assessment & Plan by Problem: Principal Problem:   Atrial fibrillation with RVR (HCC) Active Problems:   Essential hypertension   Chronic kidney disease, stage 3a (HCC)   Chronic back pain   COPD (chronic obstructive pulmonary disease) (HCC)   Chronic respiratory failure with hypoxia (HCC)   Anxiety   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)   Presence of permanent cardiac pacemaker   Acute kidney injury superimposed on chronic kidney disease   (HFpEF) heart failure with preserved ejection fraction (HCC)   GERD (gastroesophageal reflux disease)   Elevated troponin   Benign hypertension   Long term (current) use of anticoagulants   Brittany Irwin is a 84 y.o. person living with a history of chronic diastolic CHF, hypertension, COPD on 4 L nasal cannula chronically, Crohn's, CKD stage IIIa, PAF, tachybradycardia syndrome s/p PPM who presented to the ED with palpitations and admitted for management of AFIB with RVR on hospital day 0   #Atrial fibrillation with RVR Diagnosed with paroxysmal atrial fibrillation in November 2025.  Follows with cardiology through [facility] and noted to be asymptomatic with low burden at that time.  She was continued on Eliquis  5 mg twice daily as well as atenolol  for rate control. Presented with palpitations, SOB, and chest pressure. EMS noted AF with RVR (HR 130-150). Initial trate control with IV diltiazem  resulted in hypotension, transitioned to IV amiodarone  with successful conversion to sinus rhythm around 0620. Symptoms significantly improved post-conversion. Possible contributors include recent infection (partially treated pneumonia), possible missed doses of eliquis , or recent stressors.  - telemetry monitoring - Cardiology following - continue eliquis  5mg  BID - continue amiodarone  infusion at 30mg /hr. Transition to PO after 24 hours - pacemaker  interrogated this AM, f/u results - avoid diltiazem  due to hypotension - replete electrolytes (goal K>4, Mg>2) - home meds: eliquis  5mg  BID atenolol  25mg  daily  #Acute on chronic HFpEF History of diastolic heart failure with 8/25 echo EF 60-65%n normal RV, mild MR. Mild volume overload on admission, likely exacerbated by AF with RVR. Responded well to lasix . - s/p lasix  40mg  x1 this AM with good urine output - repeat echo, f/u results - home meds : lasix  40mg  daily, spironolactone  12.5 daily, atenolol  25mg  daily  #Elevated troponin #Chest pressure  Prior stress testing (2023) and cath (2018) without significant CAD. High sensitivity troponin elevated and rising (122->161) with transient chest heaviness occurring after onset tachyarrhythmia. This is now resolved. Likely demand ischemia in the setting of rapid heart rates. - trend troponins to peak - monitor for recurrent chest pain - f/u echo   #Tachy-brady syndrome s/p PPM History  of tachy-brady syndrome with pacemaker in place. - telemetry monitoring.  - f/u device interrogation -- follows with [facility]    #COPD/chronic respiratory failure with hypoxia on O2 at 4 L Baseline COPD on 4L [place] at home. Increased oxygen requirement on presentation, likely multifactorial (AF with RVR, mild volume overload). No evidence of acute COPD exacerbation or pneumonia on CXR. Weaned to home 4 L during admission evaluation. - wean oxygen as tolerated toward baseline - continue home inhaler - no steroids or antibiotics at this time. Patient also has an allergy documented to methylprednisolone .  - Follows with pulmonary as an outpatient - home meds: trelegy ellipta  1 puff daily, levalbuterol  nebulizer, loratadine   #AKI on CKD stage IIIa Baseline creatine ~0.8-0.9. sCr 1.2 here. Likely pre-renal in the setting of Afib w/ RVR. Expect to improve in NSR. - Monitor renal function while diuresing - Avoid nephrotoxins - home meds: apixaban , furosemide ,  spironolactone   #Chronic peripheral neuropathy/neuropathy/spinal stenosis Patient has had issues with chronic leg and back pain. Allergic to morphine and codeine .  - home meds: lyrica , aceteminophen-codeine  q4h PRN, xanax , voltaren  gel, robaxin , mirapex    #Crohn's disease history of C. difficile, Crohn's disease, h/o multiple ileal resections with h/o SBOs.  She is followed by Dr Rosabel for Crohn's disease which had been treated in the past with Humira (D/Cd 2/2 heart failure), MTX (D/Cd 2/2 elevated LFTs and cirrhosis), Colestipol  (caused constipation), now on Tylenol  with Codeine  and Colestid  prn   #Hyperlipidemia Home meds: zetia  10mg  daily, lofibra 200mg  daily, colestid  1g PO BID PRN  Gout Home allopurinol  50mg  daily  #GERD Stable on home meds Home meds: omeparzole 40mg  daily  Diet: Heart Healthy VTE: Eliquis  IVF: None,None Code: Full  Prior to Admission Living Arrangement: Home, living alone Anticipated Discharge Location: Home Barriers to Discharge: clinical improvement  Dispo: Admit patient to Observation with expected length of stay less than 2 midnights.  Signed: Nonda Carrie, MS3  Please page on call intern or resident: First contact: [phone] If no answer in 15 minutes, please contact senior pager at [phone]  Attestation for Student Documentation:  I personally was present and re-performed the history, physical exam and medical decision-making activities of this service and have verified that the service and findings are accurately documented in the students note.  Jolaine Pac, DO 08/18/2024, 1:54 PM     [1]  Current Meds  Medication Sig   acetaminophen -codeine  (TYLENOL  #4) 300-60 MG tablet Take 1 tablet by mouth 4 (four) times daily as needed (for back pain for Crohn's).   allopurinol  (ZYLOPRIM ) 100 MG tablet Take 50 mg by mouth daily.   ALPRAZolam  (XANAX ) 1 MG tablet Take 1 mg by mouth 2 (two) times daily.   apixaban  (ELIQUIS ) 5 MG TABS tablet  Take 5 mg by mouth 2 (two) times daily.   atenolol  (TENORMIN ) 25 MG tablet Take 1 tablet (25 mg total) by mouth daily.   Calcium Carbonate Antacid (TUMS PO) Take 1-2 tablets by mouth 2 (two) times daily as needed (heartburn, indigestion).   colestipol  (COLESTID ) 1 g tablet Take 1 tablet (1 g total) by mouth 2 (two) times daily as needed (diarrhea).   cyanocobalamin (VITAMIN B12) 1000 MCG/ML injection Inject 1,000 mcg into the muscle every 30 (thirty) days.   cyclobenzaprine  (FLEXERIL ) 5 MG tablet Take 5 mg by mouth daily as needed for muscle spasms.   diclofenac  Sodium (VOLTAREN  ARTHRITIS PAIN) 1 % GEL Apply 1 Application topically 2 (two) times daily as needed (pain).   ezetimibe  (ZETIA ) 10  MG tablet Take 10 mg by mouth at bedtime.   fenofibrate  micronized (LOFIBRA) 200 MG capsule Take 200 mg by mouth daily.   Fluticasone -Umeclidin-Vilant (TRELEGY ELLIPTA ) 200-62.5-25 MCG/ACT AEPB Inhale 1 puff into the lungs daily.   furosemide  (LASIX ) 40 MG tablet Take 40 mg by mouth daily. Or as directed by md   levalbuterol  (XOPENEX  HFA) 45 MCG/ACT inhaler Inhale 2 puffs into the lungs 2 (two) times daily as needed for shortness of breath or wheezing.   nitroGLYCERIN  (NITROSTAT ) 0.4 MG SL tablet Place 0.4 mg under the tongue every 5 (five) minutes as needed for chest pain.   omeprazole (PRILOSEC) 40 MG capsule Take 40 mg by mouth daily.   oxyCODONE  (OXY IR/ROXICODONE ) 5 MG immediate release tablet Take 5 mg by mouth every 6 (six) hours as needed (severe back pain).   pregabalin  (LYRICA ) 50 MG capsule Take 50 mg by mouth 2 (two) times daily.   spironolactone  (ALDACTONE ) 25 MG tablet Take 0.5 tablets (12.5 mg total) by mouth daily.   Vitamin D , Ergocalciferol , (DRISDOL ) 1.25 MG (50000 UNIT) CAPS capsule Take 50,000 Units by mouth every Thursday.   "

## 2024-08-18 NOTE — ED Notes (Signed)
 Cards remains at [facility]. No changes.

## 2024-08-18 NOTE — Consult Note (Signed)
 "  Cardiology Consultation   Patient ID: Brittany Irwin MRN: [id number]; DOB: Jun 26, 1941  Admit date: 08/18/2024 Date of Consult: 08/18/2024  PCP:  [name], MD   [facility] Providers Cardiologist:  LUKE BASS, DO        Patient Profile: Brittany Irwin is a 84 y.o. female with a hx of PAF who is being seen 08/18/2024 for the evaluation of AFib at the request of ED.  History of Present Illness: Brittany Irwin presents with new onset palpitations tonight, diaphoresis, and SOB. ECG with irregular wide complex tachyarrhythmia, likely Afib, with baseline RBBB (seen on ECGs earlier Jan and Nov of last year). Currently on O2 and still feeling some difficulty breathing worse than usual.  Unclear precipitant of symptoms  Prior cath in 2018 with no obstruction Stress in 2023 with normal perfusion Prior h/o paroxysmal Afib on atenolol  and apixaban , pacemaker in place. Last device check noted mostly V pacing in 05/2024 HFpEF on spironolactone  and home lasix    Past Medical History:  Diagnosis Date   Acute CHF (congestive heart failure) (HCC) 01/02/2022   AKI (acute kidney injury) 09/08/2020   Aspiration pneumonia (HCC) 01/05/2019   Atypical chest pain 03/11/2022   Bradycardia 03/14/2020   C. difficile colitis 04/03/2021   CHF (congestive heart failure) (HCC)    COPD (chronic obstructive pulmonary disease) (HCC)    Coronary artery disease    Tachycardia 05/31/2013    Past Surgical History:  Procedure Laterality Date   ABDOMINAL HYSTERECTOMY     BOWEL RESECTION     CHOLECYSTECTOMY       Medications: reconciled  Allergies:   Allergies[1]  Social History:   Social History   Socioeconomic History   Marital status: Single    Spouse name: Not on file   Number of children: Not on file   Years of education: Not on file   Highest education level: Not on file  Occupational History   Not on file  Tobacco Use   Smoking status: Former    Types: Cigarettes    Smokeless tobacco: Not on file  Substance and Sexual Activity   Alcohol  use: Not Currently   Drug use: Never   Sexual activity: Not on file  Other Topics Concern   Not on file  Social History Narrative   Not on file   Social Drivers of Health   Tobacco Use: Medium Risk (08/18/2024)   Patient History    Smoking Tobacco Use: Former    Smokeless Tobacco Use: Unknown    Passive Exposure: Not on file  Financial Resource Strain: Low Risk (10/14/2023)   Received from [facility]   Overall Financial Resource Strain (CARDIA)    Difficulty of Paying Living Expenses: Not hard at all  Recent Concern: Financial Resource Strain - Medium Risk (07/27/2023)   Received from [facility]   Overall Financial Resource Strain (CARDIA)    Difficulty of Paying Living Expenses: Somewhat hard  Food Insecurity: No Food Insecurity (06/11/2024)   Epic    Worried About Radiation Protection Practitioner of Food in the Last Year: Never true    Ran Out of Food in the Last Year: Never true  Transportation Needs: No Transportation Needs (06/11/2024)   Epic    Lack of Transportation (Medical): No    Lack of Transportation (Non-Medical): No  Physical Activity: Insufficiently Active (07/27/2023)   Received from [facility]   Exercise Vital Sign    On average, how many days per week do you engage in moderate  to strenuous exercise (like a brisk walk)?: 1 day    On average, how many minutes do you engage in exercise at this level?: 20 min  Stress: No Stress Concern Present (07/27/2023)   Received from [facility] - Occupational Stress Questionnaire    Feeling of Stress : Only a little  Social Connections: Moderately Isolated (06/11/2024)   Social Connection and Isolation Panel    Frequency of Communication with Friends and Family: Three times a week    Frequency of Social Gatherings with Friends and Family: Three times a week    Attends Religious Services: More than 4 times per year     Active Member of Clubs or Organizations: No    Attends Banker Meetings: Never    Marital Status: Never married  Intimate Partner Violence: Not At Risk (06/11/2024)   Epic    Fear of Current or Ex-Partner: No    Emotionally Abused: No    Physically Abused: No    Sexually Abused: No  Depression (PHQ2-9): Not on file  Alcohol  Screen: Not on file  Housing: Low Risk (06/11/2024)   Epic    Unable to Pay for Housing in the Last Year: No    Number of Times Moved in the Last Year: 0    Homeless in the Last Year: No  Utilities: Not At Risk (06/11/2024)   Epic    Threatened with loss of utilities: No  Health Literacy: Not on file    Family History:   History reviewed. No pertinent family history.   ROS:  Please see the history of present illness.  All other ROS reviewed and negative.     Physical Exam/Data: Vitals:   08/18/24 0406 08/18/24 0409 08/18/24 0415 08/18/24 0417  BP: 112/77     Pulse: (!) 139 (!) 135 (!) 134   Resp: (!) 21 19 (!) 21   Temp:    98.2 F (36.8 C)  TempSrc:    Oral  SpO2: 99% 97% 98%   Weight: 74 kg     Height: 5' 4 (1.626 m)         08/18/2024    4:06 AM 08/03/2024    6:38 AM 05/30/2024    3:10 PM  Last 3 Weights  Weight (lbs) 163 lb 2.3 oz 163 lb 2.3 oz 163 lb 2.3 oz  Weight (kg) 74 kg 74 kg 74 kg     Body mass index is 28 kg/m.  General:  Well nourished, well developed, in mild acute distress HEENT: normal Neck: elevated JVD mid neck Vascular: Distal pulses 2+ bilaterally Cardiac:  normal S1, S2; irregular, no murmur  Lungs:  clear to auscultation bilaterally, no wheezing, rhonchi or rales  Abd: soft, nontender, bowel sounds present Ext: cool, no edema Skin: warm and dry   EKG:  The EKG was personally reviewed and demonstrates:  AFib   Laboratory Data: Still pending  Radiology/Studies:  CXR with mild pulmonary edema   Assessment and Plan: Recurrent rapid Afib - she had gotten a dose of dilt which dropped her blood  pressure, and reports adherence to her apixaban  - recommend amiodarone  for rhythm control  HFpEF - some volume overload and hypoxia, recommend IV lasix .  She states she gets cramps easily after lasix , so close attention to electrolytes.  Has had side effects with gabapentin  before which was given for leg cramps.   Risk Assessment/Risk Scores:       [place]  Heart Association (  NYHA) Functional Class NYHA Class III       For questions or updates, please contact [facility] Please consult [email or web address] for contact info under     Signed, Andee Flatten, MD  08/18/2024 4:30 AM     [1]  Allergies Allergen Reactions   Doxycycline  Diarrhea, Nausea And Vomiting and Other (See Comments)    Severe abdominal pain.    Epipen [Epinephrine] Hypertension   Flagyl [Metronidazole] Hives   Methylprednisolone  Other (See Comments)    Increased BP, HR, agitation, hallucinations    Motrin [Ibuprofen] Nausea Only and Other (See Comments)    Told not to take medication due to GI distress caused by crohn's disease.   Nsaids Other (See Comments)    Told not to take NSAIDs due to GI distress caused by crohn's disease   Penicillins Hives    12/2021, 11/2023 tolerated cephalosporins    Shellfish Allergy Nausea And Vomiting    All SEAFOOD   Ativan  [Lorazepam ] Other (See Comments)    Psychosis   Azulfidine [Sulfasalazine] Other (See Comments)    Headache    Morphine And Codeine  Itching    Told not to take due to GI distress caused by crohn's disease   "

## 2024-08-18 NOTE — ED Triage Notes (Signed)
 Pt coming in from home reporting palpitations that started at midnight. Pt was diaphoretic and clammy found to be in afib rvr.  Pt on 6l [place] baseline is 4l [place] Pt given ns  10 mg cardizem  dropped bp    106/62 Afib rvr 138-160  96% on 6l

## 2024-08-18 NOTE — ED Notes (Signed)
Interrogated [employer] pacemaker.  

## 2024-08-19 DIAGNOSIS — G629 Polyneuropathy, unspecified: Secondary | ICD-10-CM | POA: Diagnosis not present

## 2024-08-19 DIAGNOSIS — K219 Gastro-esophageal reflux disease without esophagitis: Secondary | ICD-10-CM | POA: Diagnosis not present

## 2024-08-19 DIAGNOSIS — N179 Acute kidney failure, unspecified: Secondary | ICD-10-CM | POA: Diagnosis not present

## 2024-08-19 DIAGNOSIS — J9611 Chronic respiratory failure with hypoxia: Secondary | ICD-10-CM

## 2024-08-19 DIAGNOSIS — I130 Hypertensive heart and chronic kidney disease with heart failure and stage 1 through stage 4 chronic kidney disease, or unspecified chronic kidney disease: Secondary | ICD-10-CM | POA: Diagnosis not present

## 2024-08-19 DIAGNOSIS — E785 Hyperlipidemia, unspecified: Secondary | ICD-10-CM | POA: Diagnosis not present

## 2024-08-19 DIAGNOSIS — I5033 Acute on chronic diastolic (congestive) heart failure: Secondary | ICD-10-CM | POA: Diagnosis not present

## 2024-08-19 DIAGNOSIS — F419 Anxiety disorder, unspecified: Secondary | ICD-10-CM

## 2024-08-19 DIAGNOSIS — I4891 Unspecified atrial fibrillation: Secondary | ICD-10-CM | POA: Diagnosis not present

## 2024-08-19 DIAGNOSIS — M109 Gout, unspecified: Secondary | ICD-10-CM | POA: Diagnosis not present

## 2024-08-19 DIAGNOSIS — N1831 Chronic kidney disease, stage 3a: Secondary | ICD-10-CM | POA: Diagnosis not present

## 2024-08-19 DIAGNOSIS — J449 Chronic obstructive pulmonary disease, unspecified: Secondary | ICD-10-CM | POA: Diagnosis not present

## 2024-08-19 LAB — COMPREHENSIVE METABOLIC PANEL WITH GFR
ALT: <5 U/L (ref 0–44)
AST: 38 U/L (ref 15–41)
Albumin: 3.7 g/dL (ref 3.5–5.0)
Alkaline Phosphatase: 46 U/L (ref 38–126)
Anion gap: 11 (ref 5–15)
BUN: 25 mg/dL — ABNORMAL HIGH (ref 8–23)
CO2: 28 mmol/L (ref 22–32)
Calcium: 9.2 mg/dL (ref 8.9–10.3)
Chloride: 96 mmol/L — ABNORMAL LOW (ref 98–111)
Creatinine, Ser: 1.34 mg/dL — ABNORMAL HIGH (ref 0.44–1.00)
GFR, Estimated: 39 mL/min — ABNORMAL LOW
Glucose, Bld: 105 mg/dL — ABNORMAL HIGH (ref 70–99)
Potassium: 4.2 mmol/L (ref 3.5–5.1)
Sodium: 136 mmol/L (ref 135–145)
Total Bilirubin: 0.7 mg/dL (ref 0.0–1.2)
Total Protein: 6.6 g/dL (ref 6.5–8.1)

## 2024-08-19 LAB — CBC
HCT: 37.2 % (ref 36.0–46.0)
Hemoglobin: 11.8 g/dL — ABNORMAL LOW (ref 12.0–15.0)
MCH: 28 pg (ref 26.0–34.0)
MCHC: 31.7 g/dL (ref 30.0–36.0)
MCV: 88.2 fL (ref 80.0–100.0)
Platelets: 212 10*3/uL (ref 150–400)
RBC: 4.22 MIL/uL (ref 3.87–5.11)
RDW: 15 % (ref 11.5–15.5)
WBC: 6.7 10*3/uL (ref 4.0–10.5)
nRBC: 0 % (ref 0.0–0.2)

## 2024-08-19 LAB — TROPONIN T, HIGH SENSITIVITY
Troponin T High Sensitivity: 176 ng/L (ref 0–19)
Troponin T High Sensitivity: 192 ng/L (ref 0–19)

## 2024-08-19 MED ORDER — AMIODARONE HCL 200 MG PO TABS
200.0000 mg | ORAL_TABLET | Freq: Two times a day (BID) | ORAL | Status: DC
  Administered 2024-08-19 – 2024-08-20 (×3): 200 mg via ORAL
  Filled 2024-08-19 (×3): qty 1

## 2024-08-19 NOTE — Plan of Care (Signed)

## 2024-08-19 NOTE — Care Management Obs Status (Signed)
 MEDICARE OBSERVATION STATUS NOTIFICATION   Patient Details  Name: JANNICE BEITZEL MRN: [id number] Date of Birth: 16-Jun-1941   Medicare Observation Status Notification Given:  No (No response to attempts to contact by phone)    Robynn Eileen Hoose, RN 08/19/2024, 9:11 AM

## 2024-08-19 NOTE — Progress Notes (Signed)
 "  Progress Note  Patient Name: Brittany Irwin Date of Encounter: 08/19/2024 [facility] Cardiologist: LUKE BASS, DO   Interval Summary   No acute overnight events. Patient reports feeling relatively well. No new or acute complaints.   Vital Signs Vitals:   08/19/24 0119 08/19/24 0246 08/19/24 0349 08/19/24 0800  BP: (!) 98/48 (!) 108/56 (!) 155/71 128/88  Pulse:    67  Resp:   18   Temp:   97.7 F (36.5 C)   TempSrc:   Oral   SpO2:    93%  Weight:   72.3 kg   Height:        Intake/Output Summary (Last 24 hours) at 08/19/2024 0832 Last data filed at 08/19/2024 0603 Gross per 24 hour  Intake 120 ml  Output 300 ml  Net -180 ml      08/19/2024    3:49 AM 08/18/2024    4:06 AM 08/03/2024    6:38 AM  Last 3 Weights  Weight (lbs) 159 lb 6.4 oz 163 lb 2.3 oz 163 lb 2.3 oz  Weight (kg) 72.303 kg 74 kg 74 kg      Telemetry/ECG  AV paced - Personally Reviewed  Physical Exam  General: Well developed, in no acute distress.  Neck: No JVD.  Cardiac: Normal rate, regular rhythm.  Resp: Normal work of breathing.  Ext: No edema.  Neuro: No gross focal deficits.  Psych: Normal affect.   Echo 08/18/24:  1. Left ventricular ejection fraction, by estimation, is 60 to 65%. Left  ventricular ejection fraction by 2D MOD biplane is 64.5 %. The left  ventricle has normal function. The left ventricle has no regional wall  motion abnormalities. There is moderate  left ventricular hypertrophy. Left ventricular diastolic parameters are  indeterminate. The average left ventricular global longitudinal strain is  -15.9 %. The global longitudinal strain is abnormal.   2. Right ventricular systolic function is normal. The right ventricular  size is normal. Tricuspid regurgitation signal is inadequate for assessing  PA pressure.   3. Left atrial size was moderately dilated.   4. The mitral valve is degenerative. Mild mitral valve regurgitation. No  evidence of mitral stenosis.  Moderate mitral annular calcification.   5. The aortic valve is tricuspid. Aortic valve regurgitation is trivial.  No aortic stenosis is present.   6. The inferior vena cava is dilated in size with >50% respiratory  variability, suggesting right atrial pressure of 8 mmHg.   Comparison(s): No significant change from prior study.   FINDINGS   Left Ventricle: Left ventricular ejection fraction, by estimation, is 60  to 65%. Left ventricular ejection fraction by 2D MOD biplane is 64.5 %.  The left ventricle has normal function. The left ventricle has no regional  wall motion abnormalities. The  average left ventricular global longitudinal strain is -15.9 %. Strain was  performed and the global longitudinal strain is abnormal. The left  ventricular internal cavity size was normal in size. There is moderate  left ventricular hypertrophy. Left  ventricular diastolic parameters are indeterminate.   Assessment & Plan  #Atrial fibrillation with RVR #Hypercoagulable state due to AF - She was started on amiodarone  and has subsequently converted to sinus rhythm. Transition to oral 200mg  BID x 7 days then 200mg  once daily thereafter. Amiodarone  is not ideal long-term due to her lung disease. Tikosyn or AVN ablation could be considered as alternatives. Given her lung disease, not sure that general anesthesia for PVI would be safe.  -  Continue Eliquis .    Acute on chronic HFpEF - Noted to be mildly volume overloaded on admission, s/p IV Lasix  40 mg x 1 this morning with good urine output - Received IV lasix  yesterday and this morning. Will discontinue.   - Repeat echo with normal LVEF.   Elevated troponin Chest pressure -High-sensitivity troponin 122>>161. Seen by IC, Dr. Jordan, and felt to be demand ischemia. Reassuringly, EF is normal with no WMAs on echo.    Tachy-brady syndrome s/p PPM -Follows with [facility].   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  Signed, Fonda Kitty, MD   "

## 2024-08-19 NOTE — Care Management Obs Status (Signed)
 MEDICARE OBSERVATION STATUS NOTIFICATION   Patient Details  Name: Brittany Irwin MRN: [id number] Date of Birth: [birth year]   Medicare Observation Status Notification Given:  No (No response to phone contact)    Robynn Eileen Hoose, RN 08/19/2024, 1:41 PM

## 2024-08-20 ENCOUNTER — Other Ambulatory Visit (HOSPITAL_COMMUNITY): Payer: Self-pay

## 2024-08-20 DIAGNOSIS — N179 Acute kidney failure, unspecified: Secondary | ICD-10-CM | POA: Diagnosis not present

## 2024-08-20 DIAGNOSIS — I5033 Acute on chronic diastolic (congestive) heart failure: Secondary | ICD-10-CM | POA: Diagnosis not present

## 2024-08-20 DIAGNOSIS — I130 Hypertensive heart and chronic kidney disease with heart failure and stage 1 through stage 4 chronic kidney disease, or unspecified chronic kidney disease: Secondary | ICD-10-CM | POA: Diagnosis not present

## 2024-08-20 DIAGNOSIS — I4891 Unspecified atrial fibrillation: Secondary | ICD-10-CM | POA: Diagnosis not present

## 2024-08-20 LAB — BASIC METABOLIC PANEL WITH GFR
Anion gap: 12 (ref 5–15)
BUN: 27 mg/dL — ABNORMAL HIGH (ref 8–23)
CO2: 24 mmol/L (ref 22–32)
Calcium: 9.7 mg/dL (ref 8.9–10.3)
Chloride: 99 mmol/L (ref 98–111)
Creatinine, Ser: 1.14 mg/dL — ABNORMAL HIGH (ref 0.44–1.00)
GFR, Estimated: 48 mL/min — ABNORMAL LOW
Glucose, Bld: 123 mg/dL — ABNORMAL HIGH (ref 70–99)
Potassium: 4.4 mmol/L (ref 3.5–5.1)
Sodium: 135 mmol/L (ref 135–145)

## 2024-08-20 MED ORDER — PROPRANOLOL HCL 40 MG PO TABS
40.0000 mg | ORAL_TABLET | Freq: Two times a day (BID) | ORAL | 0 refills | Status: AC
  Filled 2024-08-20: qty 60, 30d supply, fill #0

## 2024-08-20 MED ORDER — AMIODARONE HCL 200 MG PO TABS
ORAL_TABLET | ORAL | 3 refills | Status: AC
  Filled 2024-08-20: qty 60, 54d supply, fill #0

## 2024-08-20 MED ORDER — PROPRANOLOL HCL 40 MG PO TABS
40.0000 mg | ORAL_TABLET | Freq: Two times a day (BID) | ORAL | Status: DC
  Administered 2024-08-20: 40 mg via ORAL
  Filled 2024-08-20 (×2): qty 1

## 2024-08-20 MED ORDER — POLYETHYLENE GLYCOL 3350 17 GM/SCOOP PO POWD
17.0000 g | Freq: Every day | ORAL | 0 refills | Status: AC | PRN
  Filled 2024-08-20: qty 14, 14d supply, fill #0
  Filled 2024-08-20: qty 238, 14d supply, fill #0

## 2024-08-20 MED ORDER — OXYCODONE HCL 5 MG PO TABS
5.0000 mg | ORAL_TABLET | Freq: Four times a day (QID) | ORAL | 0 refills | Status: AC | PRN
  Filled 2024-08-20: qty 20, 5d supply, fill #0

## 2024-08-20 MED ORDER — ALLOPURINOL 100 MG PO TABS
50.0000 mg | ORAL_TABLET | Freq: Every day | ORAL | 0 refills | Status: AC
  Filled 2024-08-20: qty 15, 30d supply, fill #0

## 2024-08-20 NOTE — Progress Notes (Signed)
 DISCHARGE NOTE HOME Brittany Irwin Ramp to be discharged Home per MD order. Discussed prescriptions and follow up appointments with the patient. Prescriptions given to patient; medication list explained in detail. Patient verbalized understanding.  Skin clean, dry and intact without evidence of skin break down, no evidence of skin tears noted. IV catheter discontinued intact. Site without signs and symptoms of complications. Dressing and pressure applied. Pt denies pain at the site currently. No complaints noted.  Patient free of lines, drains, and wounds.   An After Visit Summary (AVS) was printed and given to the patient. Patient escorted via wheelchair, and discharged home via private auto.  Peyton SHAUNNA Pepper, RN

## 2024-08-20 NOTE — TOC Transition Note (Signed)
 Transition of Care [facility]) - Discharge Note   Patient Details  Name: Brittany Irwin MRN: [id number] Date of Birth: Apr 23, 1941  [facility] CM/SW Contact:  Robynn Eileen Hoose, RN Phone Number: 08/20/2024, 10:39 AM   Clinical Narrative:  Secure message from provider regarding discharging patient today and needing transportation assistance. Spoke with patient, she declined use of cab service due to continuous oxygen use and having spinal stenosis. Offered [facility], pt declined due to having a large bill with them  from previous transports.  Patient will talk to her sister to try and get transportation home.            Patient Goals and CMS Choice            Discharge Placement                       Discharge Plan and Services Additional resources added to the After Visit Summary for                                       Social Drivers of Health (SDOH) Interventions SDOH Screenings   Food Insecurity: No Food Insecurity (08/18/2024)  Housing: Low Risk (08/18/2024)  Transportation Needs: No Transportation Needs (08/18/2024)  Utilities: Not At Risk (08/18/2024)  Financial Resource Strain: Low Risk (10/14/2023)   Received from [facility]  Recent Concern: Financial Resource Strain - Medium Risk (07/27/2023)   Received from [facility]  Physical Activity: Insufficiently Active (07/27/2023)   Received from [facility]  Social Connections: Moderately Isolated (08/18/2024)  Stress: No Stress Concern Present (07/27/2023)   Received from [facility]  Tobacco Use: Medium Risk (08/18/2024)     Readmission Risk Interventions    06/13/2024   10:20 AM 04/03/2024   10:40 AM 10/02/2023    2:35 PM  Readmission Risk Prevention Plan  Transportation Screening Complete Complete Complete  HRI or Home Care Consult   Complete  Social Work Consult for Recovery Care Planning/Counseling   Complete  Palliative Care Screening   Not Applicable  Medication Review Special Educational Needs Teacher) Complete Referral to Pharmacy Complete  PCP or Specialist appointment within 3-5 days of discharge Complete Complete   HRI or Home Care Consult Complete Complete   SW Recovery Care/Counseling Consult Complete Complete   Palliative Care Screening Not Applicable Not Applicable   Skilled Nursing Facility Not Applicable Not Applicable

## 2024-08-20 NOTE — Care Management Obs Status (Signed)
 MEDICARE OBSERVATION STATUS NOTIFICATION   Patient Details  Name: Brittany Irwin MRN: [id number] Date of Birth: 1940/10/12   Medicare Observation Status Notification Given:  Yes    Tom-Johnson, Harvest Muskrat, RN 08/20/2024, 12:44 PM

## 2024-08-20 NOTE — Discharge Instructions (Addendum)
 Ms Haruka, Kowaleski were admitted for Atrial fibrillation with rapid ventricular response - which means that you experienced atrial fibrillation causing a fast heart rate. Your new medicine to control the heart rate is amiodarone . Take one 200mg  dose this evening and then continue to take 200mg  twice daily until Friday. Then on Saturday, start taking one pill daily in the morning. Unfortunately, this may be the cause of your tremor, but with time I think it will go away. Arrange an appointment with your cardiologist as soon as possible. They may recommend changing your amiodarone  to an alternative medicine.  Try propranolol  40mg  in the morning and evening in an effort to reduce your tremor. While taking this medicine, do not take atenolol . The side effect would be low heart rate. If your heart rate is consistently below 60 and/or if you are symptommatic from it (weakness, dizziness especially when standing up) contact your cardiologist and stop the medicine.  Otherwise, resume your normal medicines as prescribed. Please contact your cardiologist for a follow up appointment.

## 2024-08-20 NOTE — Plan of Care (Signed)
   Problem: Education: Goal: Knowledge of General Education information will improve Description Including pain rating scale, medication(s)/side effects and non-pharmacologic comfort measures Outcome: Progressing   Problem: Health Behavior/Discharge Planning: Goal: Ability to manage health-related needs will improve Outcome: Progressing
